# Patient Record
Sex: Female | Born: 1935 | Race: White | Hispanic: No | Marital: Married | Smoking: Never smoker
Health system: Southern US, Community
[De-identification: ages and names within clinical notes are randomized; demographics above are authoritative.]

## PROBLEM LIST (undated history)

## (undated) DIAGNOSIS — I2510 Atherosclerotic heart disease of native coronary artery without angina pectoris: Secondary | ICD-10-CM

## (undated) DIAGNOSIS — R7302 Impaired glucose tolerance (oral): Secondary | ICD-10-CM

## (undated) DIAGNOSIS — K219 Gastro-esophageal reflux disease without esophagitis: Secondary | ICD-10-CM

## (undated) DIAGNOSIS — M810 Age-related osteoporosis without current pathological fracture: Secondary | ICD-10-CM

## (undated) DIAGNOSIS — H547 Unspecified visual loss: Secondary | ICD-10-CM

## (undated) DIAGNOSIS — E785 Hyperlipidemia, unspecified: Secondary | ICD-10-CM

## (undated) DIAGNOSIS — M1990 Unspecified osteoarthritis, unspecified site: Secondary | ICD-10-CM

## (undated) DIAGNOSIS — K449 Diaphragmatic hernia without obstruction or gangrene: Secondary | ICD-10-CM

## (undated) DIAGNOSIS — H3530 Unspecified macular degeneration: Secondary | ICD-10-CM

## (undated) DIAGNOSIS — I700 Atherosclerosis of aorta: Secondary | ICD-10-CM

## (undated) DIAGNOSIS — I493 Ventricular premature depolarization: Secondary | ICD-10-CM

## (undated) DIAGNOSIS — I499 Cardiac arrhythmia, unspecified: Secondary | ICD-10-CM

## (undated) DIAGNOSIS — I4891 Unspecified atrial fibrillation: Secondary | ICD-10-CM

## (undated) DIAGNOSIS — M4800 Spinal stenosis, site unspecified: Secondary | ICD-10-CM

## (undated) DIAGNOSIS — E739 Lactose intolerance, unspecified: Secondary | ICD-10-CM

## (undated) DIAGNOSIS — K635 Polyp of colon: Secondary | ICD-10-CM

## (undated) DIAGNOSIS — G8929 Other chronic pain: Secondary | ICD-10-CM

## (undated) DIAGNOSIS — M25569 Pain in unspecified knee: Secondary | ICD-10-CM

## (undated) DIAGNOSIS — Z91018 Allergy to other foods: Secondary | ICD-10-CM

## (undated) DIAGNOSIS — M25511 Pain in right shoulder: Secondary | ICD-10-CM

## (undated) DIAGNOSIS — M549 Dorsalgia, unspecified: Secondary | ICD-10-CM

## (undated) DIAGNOSIS — J302 Other seasonal allergic rhinitis: Secondary | ICD-10-CM

## (undated) DIAGNOSIS — M8580 Other specified disorders of bone density and structure, unspecified site: Secondary | ICD-10-CM

## (undated) HISTORY — DX: Unspecified atrial fibrillation: I48.91

## (undated) HISTORY — PX: BACK SURGERY: SHX140

## (undated) HISTORY — DX: Pain in right shoulder: M25.511

## (undated) HISTORY — DX: Other seasonal allergic rhinitis: J30.2

## (undated) HISTORY — DX: Unspecified visual loss: H54.7

## (undated) HISTORY — DX: Impaired glucose tolerance (oral): R73.02

## (undated) HISTORY — DX: Ventricular premature depolarization: I49.3

## (undated) HISTORY — DX: Allergy to other foods: Z91.018

## (undated) HISTORY — DX: Diaphragmatic hernia without obstruction or gangrene: K44.9

## (undated) HISTORY — DX: Other specified disorders of bone density and structure, unspecified site: M85.80

## (undated) HISTORY — DX: Atherosclerosis of aorta: I70.0

## (undated) HISTORY — PX: WISDOM TOOTH EXTRACTION: SHX21

## (undated) HISTORY — PX: TONSILLECTOMY AND ADENOIDECTOMY: SHX28

## (undated) HISTORY — DX: Lactose intolerance, unspecified: E73.9

## (undated) HISTORY — DX: Polyp of colon: K63.5

## (undated) HISTORY — DX: Hyperlipidemia, unspecified: E78.5

## (undated) HISTORY — DX: Atherosclerotic heart disease of native coronary artery without angina pectoris: I25.10

## (undated) HISTORY — DX: Spinal stenosis, site unspecified: M48.00

## (undated) HISTORY — DX: Unspecified osteoarthritis, unspecified site: M19.90

## (undated) HISTORY — DX: Unspecified macular degeneration: H35.30

## (undated) HISTORY — DX: Gastro-esophageal reflux disease without esophagitis: K21.9

## (undated) HISTORY — DX: Age-related osteoporosis without current pathological fracture: M81.0

---

## 1997-09-02 ENCOUNTER — Other Ambulatory Visit: Admission: RE | Admit: 1997-09-02 | Discharge: 1997-09-02 | Payer: Self-pay | Admitting: Family Medicine

## 1998-01-06 ENCOUNTER — Ambulatory Visit (HOSPITAL_COMMUNITY): Admission: RE | Admit: 1998-01-06 | Discharge: 1998-01-06 | Payer: Self-pay | Admitting: Gastroenterology

## 1998-04-29 ENCOUNTER — Encounter: Payer: Self-pay | Admitting: Emergency Medicine

## 1998-04-29 ENCOUNTER — Observation Stay (HOSPITAL_COMMUNITY): Admission: EM | Admit: 1998-04-29 | Discharge: 1998-04-30 | Payer: Self-pay | Admitting: Emergency Medicine

## 1999-07-25 ENCOUNTER — Encounter: Admission: RE | Admit: 1999-07-25 | Discharge: 1999-10-23 | Payer: Self-pay | Admitting: Family Medicine

## 2001-03-23 ENCOUNTER — Emergency Department (HOSPITAL_COMMUNITY): Admission: EM | Admit: 2001-03-23 | Discharge: 2001-03-23 | Payer: Self-pay | Admitting: Emergency Medicine

## 2001-03-23 ENCOUNTER — Encounter: Payer: Self-pay | Admitting: Emergency Medicine

## 2003-03-04 ENCOUNTER — Other Ambulatory Visit: Admission: RE | Admit: 2003-03-04 | Discharge: 2003-03-04 | Payer: Self-pay | Admitting: Family Medicine

## 2004-01-26 ENCOUNTER — Ambulatory Visit (HOSPITAL_COMMUNITY): Admission: RE | Admit: 2004-01-26 | Discharge: 2004-01-26 | Payer: Self-pay | Admitting: Gastroenterology

## 2004-01-26 ENCOUNTER — Encounter (INDEPENDENT_AMBULATORY_CARE_PROVIDER_SITE_OTHER): Payer: Self-pay | Admitting: *Deleted

## 2004-04-25 ENCOUNTER — Emergency Department (HOSPITAL_COMMUNITY): Admission: EM | Admit: 2004-04-25 | Discharge: 2004-04-25 | Payer: Self-pay | Admitting: Emergency Medicine

## 2004-06-06 ENCOUNTER — Other Ambulatory Visit: Admission: RE | Admit: 2004-06-06 | Discharge: 2004-06-06 | Payer: Self-pay | Admitting: Family Medicine

## 2004-06-20 ENCOUNTER — Encounter (INDEPENDENT_AMBULATORY_CARE_PROVIDER_SITE_OTHER): Payer: Self-pay | Admitting: Specialist

## 2004-06-20 ENCOUNTER — Ambulatory Visit (HOSPITAL_COMMUNITY): Admission: RE | Admit: 2004-06-20 | Discharge: 2004-06-20 | Payer: Self-pay | Admitting: Gastroenterology

## 2004-10-18 ENCOUNTER — Encounter: Admission: RE | Admit: 2004-10-18 | Discharge: 2004-10-18 | Payer: Self-pay | Admitting: Family Medicine

## 2005-07-19 ENCOUNTER — Encounter: Admission: RE | Admit: 2005-07-19 | Discharge: 2005-07-19 | Payer: Self-pay | Admitting: Family Medicine

## 2005-09-24 ENCOUNTER — Encounter: Admission: RE | Admit: 2005-09-24 | Discharge: 2005-09-24 | Payer: Self-pay | Admitting: Psychiatry

## 2006-01-12 ENCOUNTER — Encounter: Admission: RE | Admit: 2006-01-12 | Discharge: 2006-01-12 | Payer: Self-pay | Admitting: Family Medicine

## 2006-06-10 LAB — HM PAP SMEAR
HM Pap smear: NORMAL
HM Pap smear: NORMAL

## 2006-06-20 ENCOUNTER — Encounter: Admission: RE | Admit: 2006-06-20 | Discharge: 2006-06-20 | Payer: Self-pay | Admitting: Family Medicine

## 2007-06-26 HISTORY — PX: OTHER SURGICAL HISTORY: SHX169

## 2007-07-08 ENCOUNTER — Encounter: Admission: RE | Admit: 2007-07-08 | Discharge: 2007-10-06 | Payer: Self-pay | Admitting: Family Medicine

## 2008-04-19 ENCOUNTER — Encounter: Admission: RE | Admit: 2008-04-19 | Discharge: 2008-04-19 | Payer: Self-pay | Admitting: Surgery

## 2008-04-20 ENCOUNTER — Ambulatory Visit (HOSPITAL_BASED_OUTPATIENT_CLINIC_OR_DEPARTMENT_OTHER): Admission: RE | Admit: 2008-04-20 | Discharge: 2008-04-20 | Payer: Self-pay | Admitting: Surgery

## 2008-04-20 ENCOUNTER — Encounter (INDEPENDENT_AMBULATORY_CARE_PROVIDER_SITE_OTHER): Payer: Self-pay | Admitting: Surgery

## 2009-05-11 ENCOUNTER — Emergency Department (HOSPITAL_COMMUNITY): Admission: EM | Admit: 2009-05-11 | Discharge: 2009-05-11 | Payer: Self-pay | Admitting: Emergency Medicine

## 2009-05-25 DIAGNOSIS — S32010A Wedge compression fracture of first lumbar vertebra, initial encounter for closed fracture: Secondary | ICD-10-CM

## 2009-05-25 HISTORY — DX: Wedge compression fracture of first lumbar vertebra, initial encounter for closed fracture: S32.010A

## 2009-05-25 HISTORY — PX: VERTEBROPLASTY: SHX113

## 2009-05-28 ENCOUNTER — Encounter: Admission: RE | Admit: 2009-05-28 | Discharge: 2009-05-28 | Payer: Self-pay | Admitting: *Deleted

## 2009-06-02 ENCOUNTER — Encounter: Admission: RE | Admit: 2009-06-02 | Discharge: 2009-06-02 | Payer: Self-pay | Admitting: Specialist

## 2009-06-21 ENCOUNTER — Encounter: Admission: RE | Admit: 2009-06-21 | Discharge: 2009-06-23 | Payer: Self-pay | Admitting: Specialist

## 2009-06-29 ENCOUNTER — Encounter: Admission: RE | Admit: 2009-06-29 | Discharge: 2009-09-27 | Payer: Self-pay | Admitting: Specialist

## 2009-11-14 ENCOUNTER — Encounter: Admission: RE | Admit: 2009-11-14 | Discharge: 2009-11-14 | Payer: Self-pay | Admitting: Family Medicine

## 2009-11-14 LAB — HM MAMMOGRAPHY

## 2009-11-17 LAB — HM PAP SMEAR: HM Pap smear: NORMAL

## 2010-04-17 ENCOUNTER — Encounter: Admission: RE | Admit: 2010-04-17 | Discharge: 2010-04-17 | Payer: Self-pay | Admitting: Family Medicine

## 2010-11-07 NOTE — Op Note (Signed)
[name], [name]              ACCOUNT NO.:  [id number]   MEDICAL RECORD NO.:  [id number]          PATIENT TYPE:  AMB   LOCATION:  DSC                          FACILITY:  [facility]   PHYSICIAN:  Thornton Park. Daphine Deutscher, MD  DATE OF BIRTH:  April 26, 1930   DATE OF PROCEDURE:  04/20/2008  DATE OF DISCHARGE:                               OPERATIVE REPORT   PREOPERATIVE DIAGNOSIS:  Mass of the left neck.   POSTOPERATIVE DIAGNOSIS:  Lipoma, left neck.   PROCEDURE:  Excision of posterior neck mass proximally 3 cm in diameter.   SURGEON:  Thornton Park. Daphine Deutscher, MD   ANESTHESIA:  MAC with local 1% lidocaine with Neut.   DESCRIPTION OF PROCEDURE:  [name] is a 77 year old lady  taken to room 1 at [facility] on the afternoon of April 20, 2008.  Then the neck was prepped with Technicare and draped sterilely.  I injected it with 1% Marcaine with Neut which is sodium bicarbonate and  then made an transverse incision along the skin line as I previously  marked.  I carried this down through the dermis and through some  thickened tissue to encounter little encapsulated multilobulated lipoma  were.  I went ahead and cut down and shelled that out and it came out in  pieces which I just extracted.  I then policed the wound.  I did not see  any other evidence of any other portions of it and I had taken this down  off the muscle, although I did not cut into the muscle fascia.  Bleeding  was controlled with electrocautery and then the wound was irrigated with  saline and closed with 4-0 Vicryl subcutaneously and subcuticularly with  Dermabond on the skin.  Sterile dressings was applied.  The patient will  be taken to the recovery room.  She will be given some Vicodin to take  if needed for pain.  Followup in the office in 4-5 weeks.      Thornton Park Daphine Deutscher, MD  Electronically Signed     MBM/MEDQ  D:  04/20/2008  T:  04/21/2008  Job:  [id number]   cc:   Talmadge Coventry, M.D.

## 2010-11-10 NOTE — Op Note (Signed)
Samantha Foley, Samantha Foley NO.:  [id number]   MEDICAL RECORD NO.:  [id number]                   PATIENT TYPE:  AMB   LOCATION:  ENDO                                 FACILITY:  [facility]   PHYSICIAN:  Danise Edge, M.D.                DATE OF BIRTH:  1929/09/28   DATE OF PROCEDURE:  01/26/2004  DATE OF DISCHARGE:                                 OPERATIVE REPORT   REFERRING PHYSICIAN:  Dr. Rise Mu Mazzocchi   PROCEDURE:  Screening colonoscopy.   PROCEDURE INDICATION:  Samantha Foley is a 77 year old female, born  [birth year].  Samantha Foley older sister was diagnosed with colon  cancer.   ENDOSCOPIST:  Danise Edge, M.D.   PREMEDICATION:  1. Versed 6 mg.  2. Fentanyl 60 mcg.   DESCRIPTION OF PROCEDURE:  After obtaining informed consent, Samantha Foley was  placed in the left lateral decubitus position.  I administered intravenous  fentanyl and intravenous Versed to achieve conscious sedation for the  procedure.  The patient's blood pressure, oxygen saturation, and cardiac  rhythm were monitored throughout the procedure and documented in the medical  record.   Anal inspection and digital rectal exam were normal.  The Olympus adjustable  pediatric colonoscope was introduced into the rectum and advanced to the  cecum.  Colonic preparation for the exam today was excellent.   RECTUM:  Normal.  SIGMOID COLON AND DESCENDING COLON:  Sigmoid colonic diverticulosis.  SPLENIC FLEXURE:  Normal.  TRANSVERSE COLON:  From the distal transverse colon, a 1 mm sessile polyp  was removed with the cold biopsy forceps.  From the proximal transverse  colon, a 1 mm sessile polyp was removed with the cold biopsy forceps.  HEPATIC FLEXURE:  Normal.  ASCENDING COLON:  Normal.  CECUM AND ILEOCECAL VALVE:  Normal.   ASSESSMENT:  1. Two diminutive polyps were removed from the transverse colon.  2. Sigmoid colonic diverticulosis.   RECOMMENDATIONS:  Repeat  colonoscopy in 5 years.                                               Danise Edge, M.D.    MJ/MEDQ  D:  01/26/2004  T:  01/26/2004  Job:  [id number]   cc:   Talmadge Coventry, M.D.  [address]  [place]  [place] [address]  Fax: [phone]

## 2010-11-10 NOTE — Op Note (Signed)
Samantha Foley NO.:  [id number]   MEDICAL RECORD NO.:  [id number]          PATIENT TYPE:  AMB   LOCATION:  ENDO                         FACILITY:  [facility]   PHYSICIAN:  Danise Edge, M.D.   DATE OF BIRTH:  June 06, 1930   DATE OF PROCEDURE:  06/20/2004  DATE OF DISCHARGE:                                 OPERATIVE REPORT   PROCEDURE INDICATIONS:  [name] is a 77 year old female born  1930-01-16.  [name] older sister was diagnosed with colon  cancer.  [name] underwent a screening proctocolonoscopy to the cecum  January 26, 2004 which resulted in the removal of two diminutive tubular  adenomatous polyps from her transverse colon.  She is due for a repeat  surveillance colonoscopy with polypectomy to prevent colon cancer in August  of 2010.   [name] has chronic gastroesophageal reflux.  Despite taking Nexium 40  mg twice daily, she is experiencing nocturnal regurgitation associated with  burning, retrosternal discomfort and cough.  She also experiences  regurgitation after a big meal if she bends over at the waist.  She does not  experience day time heartburn and reports no dysphagia or odynophagia.   [name] plans to join [employer] in January 2006 in the hopes of  losing excess weight.  She tells me she underwent an abdominal ultrasound  which showed no gallstones and an upper GI x-ray series which revealed a  hiatal hernia.   MEDICATION ALLERGIES:  Penicillin and Demerol.   PAST MEDICAL HISTORY:  Dilatation and curettage, hiatal hernia,  environmental allergies.   FAMILY HISTORY:  Sister diagnosed with colon cancer.   HABITS:  [name] does not smoke cigarettes and does not consume alcohol.   ENDOSCOPIST:  Danise Edge, M.D.   PREMEDICATION:  Versed 5 mg, fentanyl 25 mcg.   PROCEDURE:  After obtaining informed consent, [name] was placed in the  left lateral decubitus position.  I administered  intravenous fentanyl and  intravenous Versed to achieve conscious sedation for the procedure.  The  patient's blood pressure, oxygen saturation and cardiac rhythm were  monitored throughout the procedure and documented in the medical record.   The Olympus gastroscope was passed through the posterior hypopharynx into  the proximal stomach without difficulty.  The hypopharynx, larynx, and vocal  cords appeared normal.   ESOPHAGOSCOPY:  The procedure mid and lower segments of the esophageal  mucosa appear normal.  There is no endoscopic evidence for the presence of  erosive esophagitis, esophageal mucosal snoring or Barrett's esophagus.  The  esophagogastric junction and squamocolumnar junction are noted at 38 cm from  the incisor teeth.   GASTROSCOPY:  A retroflexed view of the gastric cardia and fundus was  normal.  The diaphragmatic hiatus is slightly patulous.  Examination of the  proximal gastric body reveals a few 1 mm - 2 mm gastric polyps which were  biopsied.  The remainder of the gastric body, antrum and pylorus appeared  normal.   DUODENOSCOPY:  The duodenal bulb, and descending duodenum appear normal.   ASSESSMENT:  Except for  the presence of a few diminutive gastric polyps, Ms.  Samantha Foley esophagogastroduodenoscopy is normal.  Gastric polyp biopsy  pending.      MJ/MEDQ  D:  06/20/2004  T:  06/20/2004  Job:  [id number]   cc:   Talmadge Coventry, M.D.  [address]  [place]  [place] [address]  Fax: [phone]

## 2011-03-26 LAB — BASIC METABOLIC PANEL
CO2: 31
Calcium: 9.4
GFR calc non Af Amer: >60
Potassium: 4.2
Sodium: 141

## 2011-03-26 LAB — POCT HEMOGLOBIN-HEMACUE: Hemoglobin: 14.4

## 2011-04-23 ENCOUNTER — Other Ambulatory Visit: Payer: Self-pay | Admitting: Family Medicine

## 2011-04-23 DIAGNOSIS — Z79899 Other long term (current) drug therapy: Secondary | ICD-10-CM

## 2011-04-23 DIAGNOSIS — Z1231 Encounter for screening mammogram for malignant neoplasm of breast: Secondary | ICD-10-CM

## 2011-05-08 ENCOUNTER — Ambulatory Visit
Admission: RE | Admit: 2011-05-08 | Discharge: 2011-05-08 | Disposition: A | Discharge status: Home / Self Care | Payer: Medicare Other | Source: Ambulatory Visit | Attending: Family Medicine | Admitting: Family Medicine

## 2011-05-08 DIAGNOSIS — Z79899 Other long term (current) drug therapy: Secondary | ICD-10-CM

## 2011-05-08 DIAGNOSIS — M8580 Other specified disorders of bone density and structure, unspecified site: Secondary | ICD-10-CM

## 2011-05-08 DIAGNOSIS — Z1231 Encounter for screening mammogram for malignant neoplasm of breast: Secondary | ICD-10-CM

## 2011-05-08 LAB — HM DEXA SCAN

## 2011-07-02 ENCOUNTER — Encounter: Payer: Self-pay | Admitting: Medical

## 2011-07-02 ENCOUNTER — Ambulatory Visit (INDEPENDENT_AMBULATORY_CARE_PROVIDER_SITE_OTHER): Payer: Medicare Other | Admitting: Medical

## 2011-07-02 VITALS — BP 110/70 | HR 68 | Temp 98.4°F | Resp 16 | Ht 61.0 in | Wt 176.0 lb

## 2011-07-02 DIAGNOSIS — R0982 Postnasal drip: Secondary | ICD-10-CM

## 2011-07-02 DIAGNOSIS — J40 Bronchitis, not specified as acute or chronic: Secondary | ICD-10-CM

## 2011-07-02 MED ORDER — HYDROCODONE-HOMATROPINE 5-1.5 MG/5ML PO SYRP: 5.0000 mL | ORAL_SOLUTION | Freq: Four times a day (QID) | ORAL | Status: AC | PRN

## 2011-07-02 NOTE — Progress Notes (Signed)
Subjective:  Samantha Foley is a 78 y.o. female who presents as a new patient.  Is a former patient of Dr. Wynetta Emery.  She has been having lingering cough.  Cough started before Christmas, she had a bad cold that lasted for about 6 days, and although the cold symptoms improved, she still has lingering cough.  She notes bad post nasal drip, had 1/2 hour coughing spell last night, has clear nasal drainage and clear sputum, but no ear pain, sore throat, fever.  She notes some sinus pressure, but no chest congestion.  She is a nonsmoker, no hx/o lung disease.  Denies chest pain, SOB, no edema.  She had been using Allegra for the cold symptoms, but hasn't used it in the last week or more.  Denies sick contacts.  No other aggravating or relieving factors.  No other c/o.  The following portions of the patient's history were reviewed and updated as appropriate: allergies, current medications, past family history, past medical history, past social history, past surgical history and problem list.  Past Medical History  Diagnosis Date  . Allergy   . Vision problem     wears contacts  . Arthritis   . Lactose intolerance   . GERD (gastroesophageal reflux disease)   . History of dilation and curettage     ROS: Gen: no fever, chills, sweats, wt loss Skin: no rash Lungs: no SOB, wheezing Heart: no CP, palpitations, edema GI: no belly pain , nvd    Objective:   Filed Vitals:   07/02/11 1407  BP: 110/70  Pulse: 68  Temp: 98.4 F (36.9 C)  Resp: 16    General appearance: Alert, WD/WN, no distress                             Skin: warm, no rash, no diaphoresis                           Head: no sinus tenderness                            Eyes: conjunctiva normal, corneas clear, PERRLA                            Ears: pearly TMs, external ear canals normal                          Nose: septum midline, turbinates swollen, with erythema and clear discharge             Mouth/throat: MMM, tongue  normal, mild pharyngeal erythema, post nasal drip                           Neck: supple, no adenopathy, no thyromegaly, nontender                          Heart: RRR, normal S1, S2, no murmurs                         Lungs: +bronchial breath sounds, no scattered rhonchi, no wheezes, no rales                Extremities: no edema, nontender  Assessment and Plan:   Encounter Diagnoses  Name Primary?  . Bronchitis Yes  . Post-nasal drip    Symptoms and exam suggest inflammation, post nasal drip and lingering cough without obvious infection.  Prescription given today for Hycodan syrup as below.  Restart Allegra.  Discussed diagnosis and treatment of bronchitis.  Suggested symptomatic OTC remedies for cough and congestion.  Nasal saline spray for nasal congestion.  Tylenol or Ibuprofen OTC for fever and malaise.  Call/return in 2-3 days if symptoms are worse or not improving.  If worsening, consider chest xray.

## 2011-07-02 NOTE — Patient Instructions (Signed)
Begin back on Allegra for the next 2-3 weeks regarding post nasal drip.  Consider nasal saline spray and salt water gargles for the drainage.  I wrote a prescription for Hycodan cough syrup.  You can use this either at bedtime only, to up to every 6 hours.   Drink plenty of water.    If not improving, or if worse or fever in the next 2-3 days, return for chest xray and additional evaluation.    Acute Bronchitis Bronchitis is a problem of the air tubes leading to your lungs. Acute means the illness started quickly. In this condition, the lining of those tubes becomes puffy (swollen) and can leak fluid. This makes it harder for air to get in and out of your lungs. You may cough a lot. This is because the air tubes are narrow. Bronchitis is most often caused by a virus. Medicines that kill germs (antibiotics) may be needed with germ (bacteria) infections for people who:  Smoke.   Have lasting (chronic) lung problems.   Are elderly.  HOME CARE  Rest.   Drink enough water and fluids to keep the pee clear or pale yellow.   Only take medicine as told by your doctor.   Medicines may be prescribed that will open up the airways. This will help make breathing easier.   Bronchitis usually gets better on its own in a few days.  Recovery from some problems (symptoms) of bronchitis may be slow. You should start feeling a little better after 2 to 3 days. Coughing may last for 3 to 4 weeks. GET HELP RIGHT AWAY IF:  You or your child has a temperature by mouth above 101, not controlled by medicine.   Chills or chest pain develops.   You or your child develops very bad shortness of breath.   There is bloody saliva mixed with mucus (sputum).   You or your child throws up (vomits) often, loses too much fluid (dehydration), feels faint, or has a very bad headache.   You or your child does not improve after 1 week of treatment.  MAKE SURE YOU:   Understand these instructions.   Will watch this  condition.   Will get help right away if you or your child is not doing well or gets worse.  Document Released: 11/28/2007 Document Re-Released: 09/05/2009 [facility] Patient Information 2011 [place], [place].

## 2011-07-26 ENCOUNTER — Emergency Department (INDEPENDENT_AMBULATORY_CARE_PROVIDER_SITE_OTHER): Payer: Medicare Other

## 2011-07-26 ENCOUNTER — Encounter (HOSPITAL_BASED_OUTPATIENT_CLINIC_OR_DEPARTMENT_OTHER): Payer: Self-pay

## 2011-07-26 ENCOUNTER — Emergency Department (HOSPITAL_BASED_OUTPATIENT_CLINIC_OR_DEPARTMENT_OTHER)
Admission: EM | Admit: 2011-07-26 | Discharge: 2011-07-26 | Disposition: A | Discharge status: Home / Self Care | Payer: Medicare Other | Attending: Emergency Medicine | Admitting: Emergency Medicine

## 2011-07-26 DIAGNOSIS — M549 Dorsalgia, unspecified: Secondary | ICD-10-CM | POA: Insufficient documentation

## 2011-07-26 DIAGNOSIS — S93609A Unspecified sprain of unspecified foot, initial encounter: Secondary | ICD-10-CM | POA: Insufficient documentation

## 2011-07-26 DIAGNOSIS — Z8739 Personal history of other diseases of the musculoskeletal system and connective tissue: Secondary | ICD-10-CM | POA: Insufficient documentation

## 2011-07-26 DIAGNOSIS — M7989 Other specified soft tissue disorders: Secondary | ICD-10-CM

## 2011-07-26 DIAGNOSIS — W19XXXA Unspecified fall, initial encounter: Secondary | ICD-10-CM

## 2011-07-26 DIAGNOSIS — M2010 Hallux valgus (acquired), unspecified foot: Secondary | ICD-10-CM

## 2011-07-26 DIAGNOSIS — M79609 Pain in unspecified limb: Secondary | ICD-10-CM

## 2011-07-26 DIAGNOSIS — G8929 Other chronic pain: Secondary | ICD-10-CM | POA: Insufficient documentation

## 2011-07-26 DIAGNOSIS — K219 Gastro-esophageal reflux disease without esophagitis: Secondary | ICD-10-CM | POA: Insufficient documentation

## 2011-07-26 DIAGNOSIS — W108XXA Fall (on) (from) other stairs and steps, initial encounter: Secondary | ICD-10-CM | POA: Insufficient documentation

## 2011-07-26 DIAGNOSIS — M25569 Pain in unspecified knee: Secondary | ICD-10-CM | POA: Insufficient documentation

## 2011-07-26 HISTORY — DX: Other chronic pain: G89.29

## 2011-07-26 HISTORY — DX: Dorsalgia, unspecified: M54.9

## 2011-07-26 HISTORY — DX: Pain in unspecified knee: M25.569

## 2011-07-26 MED ORDER — HYDROCODONE-ACETAMINOPHEN 5-325 MG PO TABS
ORAL_TABLET | ORAL | Status: AC
  Filled 2011-07-26: qty 1

## 2011-07-26 NOTE — ED Notes (Signed)
Patient transported to X-ray via stretcher 

## 2011-07-26 NOTE — ED Notes (Signed)
Injury to left foot after falling down two steps.  Abrasion and swelling noted to foot.

## 2011-07-26 NOTE — ED Provider Notes (Signed)
Medical screening examination/treatment/procedure(s) were performed by non-physician practitioner and as supervising physician I was immediately available for consultation/collaboration.   Dione Booze, MD 07/26/11 [phone]

## 2011-07-26 NOTE — ED Provider Notes (Signed)
History     CSN: [id number]  Arrival date & time 07/26/11  1709   First MD Initiated Contact with Patient 07/26/11 1746      Chief Complaint  Patient presents with  . Fall  . Foot Injury    (Consider location/radiation/quality/duration/timing/severity/associated sxs/prior treatment) HPI Comments: Pt states that she was walking down the steps and missed the last 2 steps:pt state that there was no dizziness or loc associated with the fall:pt states that she is having pain in the left foot:pt states that it hurts with wt bearing  Patient is a 78 y.o. female presenting with fall. The history is provided by the patient. No language interpreter was used.  Fall The accident occurred 1 to 2 hours ago. She landed on a hard floor. There was no blood loss. Point of impact: left foot. Pain location: left foot. The pain is mild. Pertinent negatives include no vomiting and no loss of consciousness. The symptoms are aggravated by activity.    Past Medical History  Diagnosis Date  . Allergy   . Vision problem     wears contacts  . Arthritis   . Lactose intolerance   . GERD (gastroesophageal reflux disease)   . History of dilation and curettage   . Chronic back pain   . Chronic knee pain     Past Surgical History  Procedure Date  . Tonsillectomy   . Adenoidectomy   . Wisdom tooth extraction   . Back surgery     No family history on file.  History  Substance Use Topics  . Smoking status: Never Smoker   . Smokeless tobacco: Not on file  . Alcohol Use: No    OB History    Grav Para Term Preterm Abortions TAB SAB Ect Mult Living                  Review of Systems  Gastrointestinal: Negative for vomiting.  Neurological: Negative for loss of consciousness.  All other systems reviewed and are negative.    Allergies  Ciprofloxacin; Scallops; Demerol; Milk-related compounds; Penicillins; and Sudafed  Home Medications   Current Outpatient Rx  Name Route Sig Dispense  Refill  . FEXOFENADINE HCL 180 MG PO TABS Oral Take 180 mg by mouth daily.     . IBUPROFEN 200 MG PO TABS Oral Take 400 mg by mouth daily.      BP 146/76  Pulse 77  Temp(Src) 97.7 F (36.5 C) (Oral)  Resp 16  Ht 5\' 3"  (1.6 m)  Wt 175 lb (79.379 kg)  BMI 31.00 kg/m2  SpO2 97%  Physical Exam  Nursing note and vitals reviewed. Constitutional: She is oriented to person, place, and time. She appears well-developed and well-nourished.  HENT:  Head: Normocephalic and atraumatic.  Cardiovascular: Normal rate and regular rhythm.   Pulmonary/Chest: Effort normal and breath sounds normal.  Musculoskeletal:       Swelling noted to the base of the left fifth metatarsal:pulses intact  Neurological: She is alert and oriented to person, place, and time.  Skin:       Pt has an abrasion and swelling noted to the base of the left fifth metatarsal:pt has full rom  Psychiatric: She has a normal mood and affect.    ED Course  Procedures (including critical care time)  Labs Reviewed - No data to display Dg Foot Complete Left  07/26/2011  *RADIOLOGY REPORT*  Clinical Data: Left foot pain.  Fall.  Fracture.  LEFT FOOT - COMPLETE  3+ VIEW  Comparison: None.  Findings: Hallux valgus.  First MTP joint osteoarthritis.  Marked soft tissue swelling is present lateral to the cuboid.  No displaced fractures identified.  Diffuse osteopenia is present. Calcific densities are present lateral to the cuboid bone which is favored to represent atherosclerotic calcifications however with overlying soft tissue swelling these could represent small avulsion fractures.  Consider conservative management.  IMPRESSION: No definite displaced fracture is identified.  Calcific densities lateral to the cuboid bone could represent small avulsion fractures and there is overlying soft tissue swelling.  Consider conservative management and follow-up radiographs.  Original Report Authenticated By: Andreas Newport, M.D.     1. Foot  sprain       MDM  Pt treated with cam walker:pt is okay to follow up with her orthopedist        Teressa Lower, NP 07/26/11 1826

## 2011-08-08 ENCOUNTER — Encounter: Payer: Self-pay | Admitting: *Deleted

## 2011-08-08 ENCOUNTER — Encounter: Payer: Self-pay | Admitting: Family Medicine

## 2011-08-08 ENCOUNTER — Ambulatory Visit (INDEPENDENT_AMBULATORY_CARE_PROVIDER_SITE_OTHER): Payer: Medicare Other | Admitting: Family Medicine

## 2011-08-08 VITALS — BP 108/60 | HR 68 | Ht 61.0 in | Wt 177.0 lb

## 2011-08-08 DIAGNOSIS — E7800 Pure hypercholesterolemia, unspecified: Secondary | ICD-10-CM

## 2011-08-08 DIAGNOSIS — K219 Gastro-esophageal reflux disease without esophagitis: Secondary | ICD-10-CM | POA: Insufficient documentation

## 2011-08-08 DIAGNOSIS — M899 Disorder of bone, unspecified: Secondary | ICD-10-CM

## 2011-08-08 DIAGNOSIS — Z0000 Encounter for general adult medical examination without abnormal findings: Secondary | ICD-10-CM

## 2011-08-08 DIAGNOSIS — R7301 Impaired fasting glucose: Secondary | ICD-10-CM | POA: Insufficient documentation

## 2011-08-08 DIAGNOSIS — I471 Supraventricular tachycardia: Secondary | ICD-10-CM

## 2011-08-08 DIAGNOSIS — Z79899 Other long term (current) drug therapy: Secondary | ICD-10-CM

## 2011-08-08 DIAGNOSIS — M8580 Other specified disorders of bone density and structure, unspecified site: Secondary | ICD-10-CM | POA: Insufficient documentation

## 2011-08-08 DIAGNOSIS — I4891 Unspecified atrial fibrillation: Secondary | ICD-10-CM | POA: Insufficient documentation

## 2011-08-08 LAB — POCT URINALYSIS DIPSTICK
Bilirubin, UA: NEGATIVE
Glucose, UA: NEGATIVE
Nitrite, UA: NEGATIVE
Urobilinogen, UA: NEGATIVE

## 2011-08-08 MED ORDER — ESOMEPRAZOLE MAGNESIUM 40 MG PO CPDR: 40.0000 mg | DELAYED_RELEASE_CAPSULE | Freq: Every day | ORAL | Status: DC

## 2011-08-08 NOTE — Patient Instructions (Addendum)
HEALTH MAINTENANCE RECOMMENDATIONS:  It is recommended that you get at least 30 minutes of aerobic exercise at least 5 days/week (for weight loss, you may need as much as 60-90 minutes). This can be any activity that gets your heart rate up. This can be divided in 10-15 minute intervals if needed, but try and build up your endurance at least once a week.  Weight bearing exercise is also recommended twice weekly.  Eat a healthy diet with lots of vegetables, fruits and fiber.  "Colorful" foods have a lot of vitamins (ie green vegetables, tomatoes, red peppers, etc).  Limit sweet tea, regular sodas and alcoholic beverages, all of which has a lot of calories and sugar.  Up to 1 alcoholic drink daily may be beneficial for women (unless trying to lose weight, watch sugars).  Drink a lot of water.  Calcium recommendations are 1200-1500 mg daily (1500 mg for postmenopausal women or women without ovaries), and vitamin D 1000 IU daily.  This should be obtained from diet and/or supplements (vitamins), and calcium should not be taken all at once, but in divided doses.  Monthly self breast exams and yearly mammograms for women over the age of 22 is recommended.  Sunscreen of at least SPF 30 should be used on all sun-exposed parts of the skin when outside between the hours of 10 am and 4 pm (not just when at beach or pool, but even with exercise, golf, tennis, and yard work!)  Use a sunscreen that says "broad spectrum" so it covers both UVA and UVB rays, and make sure to reapply every 1-2 hours.  Remember to change the batteries in your smoke detectors when changing your clock times in the spring and fall.  Use your seat belt every time you are in a car, and please drive safely and not be distracted with cell phones and texting while driving.  Please follow up with Dr. Laural Benes if you continue to have problems with your hiatal hernia/reflux despite the nexium, especially if you have ongoing trouble swallowing. You  may need to have an endoscopy repeated within the next few years, sooner if not improving.  Restart Calcium and Vitamin D supplements.  Diet for GERD or PUD Nutrition therapy can help ease the discomfort of gastroesophageal reflux disease (GERD) and peptic ulcer disease (PUD).  HOME CARE INSTRUCTIONS   Eat your meals slowly, in a relaxed setting.   Eat 5 to 6 small meals per day.   If a food causes distress, stop eating it for a period of time.  FOODS TO AVOID  Coffee, regular or decaffeinated.   Cola beverages, regular or low calorie.   Tea, regular or decaffeinated.   Pepper.   Cocoa.   High fat foods, including meats.   Butter, margarine, hydrogenated oil (trans fats).   Peppermint or spearmint (if you have GERD).   Fruits and vegetables if not tolerated.   Alcohol.   Nicotine (smoking or chewing). This is one of the most potent stimulants to acid production in the gastrointestinal tract.   Any food that seems to aggravate your condition.  If you have questions regarding your diet, ask your caregiver or a registered dietitian. TIPS  Lying flat may make symptoms worse. Keep the head of your bed raised 6 to 9 inches (15 to 23 cm) by using a foam wedge or blocks under the legs of the bed.   Do not lay down until 3 hours after eating a meal.   Daily physical activity  may help reduce symptoms.  MAKE SURE YOU:   Understand these instructions.   Will watch your condition.   Will get help right away if you are not doing well or get worse.  Document Released: 06/11/2005 Document Revised: 02/21/2011 Document Reviewed: 10/25/2008 [facility] Patient Information 2012 [place], [place].

## 2011-08-08 NOTE — Progress Notes (Signed)
Samantha Foley is a 78 y.o. female who presents for a complete physical.  She has the following concerns:  Patient states she is having some trouble swallowing, having daily heartburn, and also some chest pain this month, followed by burping.  She has h/o GERD and hiatal hernia, and has been on meds since the early 60's.  Has been off meds for about 4 years.  Last med she took was Nexium. Food sometimes gets stuck while she's eating, better by drinking fluids, but occurs once a week.  Last endoscopy was 2005.  Larey Seat last week, twisted L ankle going down the stairs.  Reports having "weak ankles", and felt the ankle give way.  Occurred while at a friend's house, couldn't get up, went to ER at [facility], and saw Dr. Jerl Santos in f/u, diagnosed with sprained ankle. Told to ice, ace wrap.  Last fall prior to this was a few years ago.  Hyperlipidemia--Previously took red yeast rice.  Hasn't taken in about 2 years.  Took fish oil until about a month ago.  Tried statins in the past, but caused significant leg pain.  Eats 3 eggs/week, and only red meat infrequently, burgers 1x/week.  Osteoporosis: Last DEXA 04/2011.  T-1.7 L femoral neck.  2.1% decrease in BMD spine and no change L hip compared to 03/2010. Previously treated with Forteo for 1.5 years, off since approximately September 2012 due to terrible leg cramps. H/o lumbar compression fracture. Not currently taking any calcium or Vitamin D supplements; recalls tolerating them fine in the past.  Palpitations, SVT.  Previously treated by Dr. Clarene Duke with Lanoxin.  She has been off the medication for a year or two.  Only rarely gets palpiations/tachycardia  DJD, spinal stenosis--under the care of Dr. Shelle Iron.  Pain is controlled with Advil.  Knee pain is also controlled by the Advil.  Health maintenance: Immunization History  Administered Date(s) Administered  . Pneumococcal Polysaccharide 04/26/2004  . Td 11/26/1994, 12/23/2005  . Tdap 03/07/2011  .  Zoster 07/16/2005  Gets flu shots yearly. Last Pap smear: 10/2009 Last mammogram: 05/08/2011 Last colonoscopy: 01/2009.  H/o polyps in 2005. Last DEXA: 04/2011 Dentist: 3 times/year Ophtho: a few months ago Exercise:  none  Past Medical History  Diagnosis Date  . Seasonal allergies   . Vision problem     wears contacts  . Arthritis   . Lactose intolerance   . GERD (gastroesophageal reflux disease)   . Food allergy     Scallops--no other seafood allergies  . Chronic back pain     Dr. Shelle Iron  . Chronic knee pain     Dr. Jerl Santos  . Colon polyp     Dr. Laural Benes  . Palpitations     SVT.  Dr. Clarene Duke; previously on Lanoxin  . IBS (irritable bowel syndrome)   . Impaired glucose tolerance   . Osteopenia   . Hyperlipidemia   . Migraine     resolved after menopause  . Hiatal hernia   . Milk allergy     Past Surgical History  Procedure Date  . Tonsillectomy and adenoidectomy age 9  . Wisdom tooth extraction   . Vertebroplasty 05/2009  . Dilation and curettage of uterus 1960's  . Sebaceous cyst excision 2009    neck    History   Social History  . Marital Status: Married    Spouse Name: N/A    Number of Children: 2  . Years of Education: N/A   Occupational History  . 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 Smoking status: Never Smoker   . Smokeless tobacco: Never Used  . Alcohol Use: Yes     maybe one glass per year.  . Drug Use: No  . Sexually Active: Not Currently   Other Topics Concern  . Not on file   Social History Narrative   Lives with her husband.  No pets.  Daughter in [place], [place]; Daughter in [place]. Cares for her 2 grandsons    Family History  Problem Relation Age of Onset  . Rheumatic fever Mother   . Diabetes Mother   . Heart disease Mother     rheumatic heart disease  . Cancer Sister 44    breast cancer  . Cancer Brother     bladder and prostate cancer  . Migraines Daughter   . Cancer Sister      leukemia at 58; colon cancer in her late 30's--family ?'s dx   Meds: takes 2 advil every morning after breakfast, no other medications currently.  No vitamins.  Allergies  Allergen Reactions  . Ciprofloxacin Shortness Of Breath and Rash  . Scallops (Shellfish Allergy) Anaphylaxis  . Demerol Other (See Comments)    Patient states it made her crazy and sleep for 12 hours  . Milk-Related Compounds Diarrhea  . Penicillins Other (See Comments)    unknown  . Sudafed (Pseudoephedrine Hcl) Other (See Comments)    hyperactive   ROS: The patient denies anorexia, fever, weight changes, headaches,  vision changes, decreased hearing, ear pain, sore throat, breast concerns, chest pain, palpitations, dizziness, syncope, dyspnea on exertion, cough, swelling, nausea, vomiting, diarrhea, constipation, abdominal pain, melena, hematochezia, hematuria, incontinence, dysuria, vaginal bleeding, discharge, odor or itch, genital lesions, joint pains, numbness, tingling, weakness, tremor, suspicious skin lesions, depression, anxiety, abnormal bleeding/bruising, or enlarged lymph nodes.  PHYSICAL EXAM: BP 108/60  Pulse 68  Ht 5\' 1"  (1.549 m)  Wt 177 lb (80.287 kg)  BMI 33.44 kg/m2  General Appearance:    Alert, cooperative, no distress, appears stated age  Head:    Normocephalic, without obvious abnormality, atraumatic  Eyes:    PERRL, conjunctiva/corneas clear, EOM's intact, fundi    benign  Ears:    Normal TM's and external ear canals  Nose:   Nares normal, mucosa normal, no drainage or sinus   tenderness  Throat:   Lips, mucosa, and tongue normal; teeth and gums normal  Neck:   Supple, no lymphadenopathy;  thyroid:  no   enlargement/tenderness/nodules; no carotid   bruit or JVD  Back:    Spine nontender, no curvature, ROM normal, no CVA     tenderness  Lungs:     Clear to auscultation bilaterally without wheezes, rales or     ronchi; respirations unlabored  Chest Wall:    No tenderness or deformity    Heart:    Regular rate and rhythm, S1 and S2 normal, no murmur, rub   or gallop  Breast Exam:    No tenderness, masses, or nipple discharge. Nipples inverted bilaterally, chronic per pt.  No axillary lymphadenopathy  Abdomen:     Soft, non-tender, nondistended, normoactive bowel sounds,    no masses, no hepatosplenomegaly  Genitalia:    Normal external genitalia without lesions.  BUS and vagina normal; no cervical motion tenderness. No abnormal vaginal discharge.  Uterus and adnexa not enlarged, nontender, no masses.  Pap performed  Rectal:    Normal tone, no masses or tenderness;  guaiac negative stool  Extremities:   No clubbing, cyanosis or edema. L ankle--some swelling.  Bruising at toes.  Pain with eversion, no bony pain  Pulses:   2+ and symmetric all extremities  Skin:   Skin color, texture, turgor normal, no rashes or lesions  Lymph nodes:   Cervical, supraclavicular, and axillary nodes normal  Neurologic:   CNII-XII intact, normal strength, sensation and gait; reflexes 2+ and symmetric throughout          Psych:   Normal mood, affect, hygiene and grooming.      ASSESSMENT/PLAN: 1. Routine general medical examination at a health care facility  POCT Urinalysis Dipstick, Visual acuity screening  2. Encounter for long-term (current) use of other medications  Comprehensive metabolic panel, CBC with Differential  3. Pure hypercholesterolemia  Lipid panel  4. Impaired fasting glucose  Hemoglobin A1c  5. GERD (gastroesophageal reflux disease)  esomeprazole (NEXIUM) 40 MG capsule  6. Osteopenia     Osteopenia--restart calcium and vitamin D.  Regular weight-bearing exercise.  Repeat DEXA in 04/2013. Normal vitamin D level in 10/2009 (although may have been on supplements at that time).  GERD--restart Nexium.  F/u with Dr. Laural Benes for possible EGD if symptoms not resolving quickly. Consider getting EGD regardless, given length of history of GERD, although last was 2005.  Hyperlipidemia--off  OTC meds, due for labs.  Return for fasting labs  Likely had TSH done with labs in 10/2010, results aren't available from that date  SVT--infrequent symptoms, off of meds.  F/u with Dr. Clarene Duke if tachycardia increases in frequency.  Discussed monthly self breast exams and yearly mammograms; at least 30 minutes of aerobic activity at least 5 days/week; proper sunscreen use reviewed; healthy diet, including goals of calcium and vitamin D intake and alcohol recommendations (less than or equal to 1 drink/day) reviewed; regular seatbelt use; changing batteries in smoke detectors.  Immunization recommendations discussed--UTD.  Colonoscopy recommendations reviewed--UTD  F/u 1 month on reflux, sooner prn

## 2011-08-09 ENCOUNTER — Other Ambulatory Visit: Payer: Self-pay | Admitting: *Deleted

## 2011-08-15 ENCOUNTER — Other Ambulatory Visit (INDEPENDENT_AMBULATORY_CARE_PROVIDER_SITE_OTHER): Payer: Medicare Other

## 2011-08-15 DIAGNOSIS — E7800 Pure hypercholesterolemia, unspecified: Secondary | ICD-10-CM

## 2011-08-15 DIAGNOSIS — R7301 Impaired fasting glucose: Secondary | ICD-10-CM

## 2011-08-15 DIAGNOSIS — R300 Dysuria: Secondary | ICD-10-CM

## 2011-08-15 DIAGNOSIS — Z79899 Other long term (current) drug therapy: Secondary | ICD-10-CM

## 2011-08-15 LAB — CBC WITH DIFFERENTIAL/PLATELET
Basophils Absolute: 0.1 10*3/uL (ref 0.0–0.1)
HCT: 39.6 % (ref 36.0–46.0)
Lymphocytes Relative: 19 % (ref 12–46)
Monocytes Absolute: 0.5 10*3/uL (ref 0.1–1.0)
Neutro Abs: 4.1 10*3/uL (ref 1.7–7.7)
Neutrophils Relative %: 71 % (ref 43–77)
Platelets: 195 10*3/uL (ref 150–400)
RDW: 14.8 % (ref 11.5–15.5)
WBC: 5.8 10*3/uL (ref 4.0–10.5)

## 2011-08-15 LAB — LIPID PANEL
Cholesterol: 233 mg/dL — ABNORMAL HIGH (ref 0–200)
HDL: 45 mg/dL (ref >39)
Triglycerides: 100 mg/dL (ref <150)

## 2011-08-15 LAB — POCT URINALYSIS DIPSTICK
Bilirubin, UA: NEGATIVE
Ketones, UA: NEGATIVE
Spec Grav, UA: 1.015
pH, UA: 6

## 2011-08-15 LAB — COMPREHENSIVE METABOLIC PANEL
ALT: 23 U/L (ref 0–35)
CO2: 27 mEq/L (ref 19–32)
Calcium: 9.1 mg/dL (ref 8.4–10.5)
Chloride: 104 mEq/L (ref 96–112)
Creat: 0.74 mg/dL (ref 0.50–1.10)

## 2011-08-17 LAB — URINE CULTURE

## 2011-08-22 ENCOUNTER — Other Ambulatory Visit: Payer: Self-pay | Admitting: *Deleted

## 2011-08-22 DIAGNOSIS — E7800 Pure hypercholesterolemia, unspecified: Secondary | ICD-10-CM

## 2011-09-05 ENCOUNTER — Ambulatory Visit (INDEPENDENT_AMBULATORY_CARE_PROVIDER_SITE_OTHER): Payer: Medicare Other | Admitting: Family Medicine

## 2011-09-05 ENCOUNTER — Encounter: Payer: Self-pay | Admitting: Family Medicine

## 2011-09-05 VITALS — BP 122/74 | HR 68 | Ht 61.0 in | Wt 180.0 lb

## 2011-09-05 DIAGNOSIS — E7800 Pure hypercholesterolemia, unspecified: Secondary | ICD-10-CM

## 2011-09-05 DIAGNOSIS — K219 Gastro-esophageal reflux disease without esophagitis: Secondary | ICD-10-CM

## 2011-09-05 MED ORDER — COLESEVELAM HCL 3.75 G PO PACK: 3.7500 g | PACK | Freq: Every day | ORAL | Status: DC

## 2011-09-05 NOTE — Patient Instructions (Signed)
Continue the Nexium for 1-2 months, then can try to cut back to every other day, and eventually you MIGHT be able to cut back to taking it just before meals that tend to trigger reflux.  But, if you need to use it longterm in order to control reflux symptoms, then that is fine.  Continue low cholesterol diet and Welchol.  Schedule labs in 2 months.  Diet for GERD or PUD Nutrition therapy can help ease the discomfort of gastroesophageal reflux disease (GERD) and peptic ulcer disease (PUD).  HOME CARE INSTRUCTIONS   Eat your meals slowly, in a relaxed setting.   Eat 5 to 6 small meals per day.   If a food causes distress, stop eating it for a period of time.  FOODS TO AVOID  Coffee, regular or decaffeinated.   Cola beverages, regular or low calorie.   Tea, regular or decaffeinated.   Pepper.   Cocoa.   High fat foods, including meats.   Butter, margarine, hydrogenated oil (trans fats).   Peppermint or spearmint (if you have GERD).   Fruits and vegetables if not tolerated.   Alcohol.   Nicotine (smoking or chewing). This is one of the most potent stimulants to acid production in the gastrointestinal tract.   Any food that seems to aggravate your condition.  If you have questions regarding your diet, ask your caregiver or a registered dietitian. TIPS  Lying flat may make symptoms worse. Keep the head of your bed raised 6 to 9 inches (15 to 23 cm) by using a foam wedge or blocks under the legs of the bed.   Do not lay down until 3 hours after eating a meal.   Daily physical activity may help reduce symptoms.  MAKE SURE YOU:   Understand these instructions.   Will watch your condition.   Will get help right away if you are not doing well or get worse.  Document Released: 06/11/2005 Document Revised: 05/31/2011 Document Reviewed: 04/27/2011 [facility] Patient Information 2012 [place], [place].

## 2011-09-05 NOTE — Progress Notes (Signed)
Patient presents for f/u on stomach issues.  At last visit, she stated she was having some trouble swallowing, having daily heartburn, and also some chest pain this month, followed by burping. She has h/o GERD and hiatal hernia, and has been on meds since the early 60's. Has been off meds for about 4 years. Last med she took was Nexium. Food sometimes gets stuck while she's eating, better by drinking fluids, but occurs once a week. Last endoscopy was 2005.  At last visit, it was recommended that she restart Nexium.  Since restarting Nexium, her symptoms completely resolved. No further dysphagia, or chest pain, or heartburn/belching.  Started the [facility] and so far is tolerating it okay.  Past Medical History  Diagnosis Date  . Seasonal allergies   . Vision problem     wears contacts  . Arthritis   . Lactose intolerance   . GERD (gastroesophageal reflux disease)   . Food allergy     Scallops--no other seafood allergies  . Chronic back pain     Dr. Shelle Iron  . Chronic knee pain     Dr. Jerl Santos  . Colon polyp     Dr. Laural Benes  . Palpitations     SVT.  Dr. Clarene Duke; previously on Lanoxin  . IBS (irritable bowel syndrome)   . Impaired glucose tolerance   . Osteopenia   . Hyperlipidemia   . Migraine     resolved after menopause  . Hiatal hernia   . Milk allergy     Past Surgical History  Procedure Date  . Tonsillectomy and adenoidectomy age 66  . Wisdom tooth extraction   . Vertebroplasty 05/2009  . Dilation and curettage of uterus 1960's  . Sebaceous cyst excision 2009    neck    History   Social History  . Marital Status: Married    Spouse Name: N/A    Number of Children: 2  . Years of Education: N/A   Occupational History  . retired (ran [employer] with her husband)    Social History Main Topics  . Smoking status: Never Smoker   . Smokeless tobacco: Never Used  . Alcohol Use: Yes     maybe one glass per year.  . Drug Use: No  . Sexually Active: Not Currently     Other Topics Concern  . Not on file   Social History Narrative   Lives with her husband.  No pets.  Daughter in [place], [place]; Daughter in [place]. Cares for her 2 grandsons    Family History  Problem Relation Age of Onset  . Rheumatic fever Mother   . Diabetes Mother   . Heart disease Mother     rheumatic heart disease  . Cancer Sister 42    breast cancer  . Cancer Brother     bladder and prostate cancer  . Migraines Daughter   . Cancer Sister     leukemia at 28; colon cancer in her late 30's--family ?'s dx    Current outpatient prescriptions:Colesevelam HCl (WELCHOL) 3.75 G PACK, Take 3.75 g by mouth daily., Disp: 30 each, Rfl: 2;  DISCONTD: Colesevelam HCl (WELCHOL) 3.75 G PACK, Take 3.75 g by mouth daily., Disp: , Rfl: ;  esomeprazole (NEXIUM) 40 MG capsule, Take 1 capsule (40 mg total) by mouth daily., Disp: 30 capsule, Rfl: 3;  fexofenadine (ALLEGRA) 180 MG tablet, Take 180 mg by mouth daily. , Disp: , Rfl:  ibuprofen (ADVIL,MOTRIN) 200 MG tablet, Take 400 mg by mouth daily., Disp: ,  Rfl:   Allergies  Allergen Reactions  . Ciprofloxacin Shortness Of Breath and Rash  . Scallops (Shellfish Allergy) Anaphylaxis  . Demerol Other (See Comments)    Patient states it made her crazy and sleep for 12 hours  . Milk-Related Compounds Diarrhea  . Penicillins Other (See Comments)    unknown  . Sudafed (Pseudoephedrine Hcl) Other (See Comments)    hyperactive   ROS:  +leg cramps at night. Hasn't tolerated Mg supplements in past due to diarrhea. Denies fevers, headaches, dizziness, dysphagia, chest pain, nausea, vomiting, URI symptoms, skin rash or other problems.  PHYSICAL EXAM: BP 148/68  Pulse 68  Ht 5\' 1"  (1.549 m)  Wt 180 lb (81.647 kg)  BMI 34.01 kg/m2 122/74 Well developed, pleasant, talkative female in no distress Neck: no lymphadenopathy or mass Heart: regular rate and rhythm Lungs: clear Abdomen: soft, nontender, nondistended, no organomegaly or mass Extremities: no  edema Psych: normal mood, affect, hygiene and grooming  Lab Results  Component Value Date   CHOL 233* 08/15/2011   HDL 45 08/15/2011   LDLCALC 168* 08/15/2011   TRIG 100 08/15/2011   CHOLHDL 5.2 08/15/2011    ASSESSMENT/PLAN: 1. Pure hypercholesterolemia  Colesevelam HCl (WELCHOL) 3.75 G PACK  2. GERD (gastroesophageal reflux disease)      GERD--risks of longterm meds vs untreated GERD reviewed at length. Continue Nexium for at least two months, then try cutting back to qod, and down to prn if able.  Hyperlipidemia: Continue Welchol.  Schedule labs for 2 months.  Low cholesterol diet reviewed.

## 2011-09-25 ENCOUNTER — Telehealth: Payer: Self-pay | Admitting: Internal Medicine

## 2011-09-25 NOTE — Telephone Encounter (Signed)
Pt was notified to stop welchol for a week and after a week to try taking it at night vs during the day and that if she continues to have rapid heartbeats to come in for evaulation. Pt was told to avoid decongestants and caffeine.

## 2011-09-25 NOTE — Telephone Encounter (Signed)
I'm concerned that these symptoms might not be related to the medication.  Have her stop the [facility] for a week.  If she is still having these rapid heartbeats, she needs to come in and be evaluated.  Avoid decongestants and caffeine.  If symptoms go away, then can retry the Welchol to see if it recurs--can try changing when she takes it (ie at night, vs during day).  This is not a typical side effect.

## 2011-09-25 NOTE — Telephone Encounter (Signed)
pt called stating that she thinks she is having side effects to the welchol. she states she is having rapid heartbeats. however one epsiode lasted 4 hours and she has been woken up twice during the night with rapid hearts beats. pt wants to know what to do. Pt states she reacts different to medications

## 2011-10-10 ENCOUNTER — Encounter (HOSPITAL_COMMUNITY): Payer: Self-pay | Admitting: Neurology

## 2011-10-10 ENCOUNTER — Other Ambulatory Visit: Payer: Self-pay

## 2011-10-10 ENCOUNTER — Emergency Department (HOSPITAL_COMMUNITY): Payer: Medicare Other

## 2011-10-10 ENCOUNTER — Emergency Department (HOSPITAL_COMMUNITY)
Admission: EM | Admit: 2011-10-10 | Discharge: 2011-10-10 | Disposition: A | Discharge status: Home / Self Care | Payer: Medicare Other | Attending: Emergency Medicine | Admitting: Emergency Medicine

## 2011-10-10 DIAGNOSIS — M79609 Pain in unspecified limb: Secondary | ICD-10-CM | POA: Insufficient documentation

## 2011-10-10 DIAGNOSIS — M5030 Other cervical disc degeneration, unspecified cervical region: Secondary | ICD-10-CM

## 2011-10-10 DIAGNOSIS — M79601 Pain in right arm: Secondary | ICD-10-CM

## 2011-10-10 LAB — BASIC METABOLIC PANEL
CO2: 26 mEq/L (ref 19–32)
Chloride: 105 mEq/L (ref 96–112)
Creatinine, Ser: 0.64 mg/dL (ref 0.50–1.10)
Potassium: 3.7 mEq/L (ref 3.5–5.1)

## 2011-10-10 LAB — CBC
HCT: 39.9 % (ref 36.0–46.0)
MCV: 88.9 fL (ref 78.0–100.0)
Platelets: 173 10*3/uL (ref 150–400)
RBC: 4.49 MIL/uL (ref 3.87–5.11)
WBC: 4.7 10*3/uL (ref 4.0–10.5)

## 2011-10-10 NOTE — ED Notes (Signed)
Patient transported to X-ray 

## 2011-10-10 NOTE — ED Notes (Signed)
In communication with CDU to move patient over

## 2011-10-10 NOTE — ED Notes (Addendum)
Pt reporting right arm pain and weakness since woke up this morning. Pt also reporting numbness in bottom lip. Pt called PCP, told to come to ER for evaluation. Pt denying any CP or SOB. Skin warm and dry. NT obtaining EKG. PT talking in clear, concise sentences. Pt ambulated to traige room without difficulty. No neuro deficits noted

## 2011-10-10 NOTE — ED Provider Notes (Signed)
Assumed care of patient in the CDU from Dr. Denton Lank.  Patient comes in today with right arm pain.  No erythema or edema.  Dr. Denton Lank ordered xray of her cervical spine and shoulder as well as CBC and BMP.  Results pending.  Plan per Dr. Denton Lank is for patient to be discharged home if labs are WNL.  He recommends starting the patient on Prednisone taper and having her follow up with PCP.  Reassessed patient.  VSS.  Alert and orientated x 3, No facial asymmetry, CN II-XII grossly intact, full ROM or right arm, no erythema or edema, normal gait.  1:55 Pm Discussed results of the xrays with the patient.  Labs unremarkable.  C-spine xray shows degenerative disease and severe disc space narrowing of C4-C5 and C5-C6.  Patient is refusing Prednisone because she reports that she is unable to sleep while on Prednisone.  Patient also reports that she does not want pain medication.  Will discharge patient home with PCP follow up.    Pascal [place], PA-C 10/10/11 1420

## 2011-10-10 NOTE — Discharge Instructions (Signed)
Return to the Emergency Department if you develop any redness or swelling of your arm, any difficulty speaking or swallowing, and weakness, severe headache, changes in vision, or any other symptoms that are concerning to you.  You can take ibuprofen every 6 hours.  Follow up with your primary care physician.

## 2011-10-10 NOTE — ED Notes (Signed)
Report given to Gabe, RN 

## 2011-10-10 NOTE — ED Provider Notes (Signed)
History     CSN: [id number]  Arrival date & time 10/10/11  1134   First MD Initiated Contact with Patient 10/10/11 1204      Chief Complaint  Patient presents with  . Arm Pain    (Consider location/radiation/quality/duration/timing/severity/associated sxs/prior treatment) Patient is a 78 y.o. female presenting with arm pain. The history is provided by the patient.  Arm Pain Pertinent negatives include no chest pain, no abdominal pain, no headaches and no shortness of breath.  pt c/o pain to entire right arm since this morning. Says goes from shoulder to wrist/hand. ?radicular in nature. Says is a pain, is not numbness/weakness. Denies hx ddd. Says sitting in certain positions seem to make worse. No relation to activity or exertion. No chest discomfort of any sort, no exertional pain. No unusual doe or fatigue. No diaphoresis. No arm numbness/weakness. Hx arthritis. Unsure of slept wrong/funny. No loss of function or dexterity. No change in speech or vision. No  Problems w balance, gait or coordination. States this morning transiently had numb/tingly feel across lower lip bilaterally, otherwise no facial numbness/weakness. Denies arm swelling. No dvt or pe hx. Denies repetitive use injury.   Past Medical History  Diagnosis Date  . Seasonal allergies   . Vision problem     wears contacts  . Arthritis   . Lactose intolerance   . GERD (gastroesophageal reflux disease)   . Food allergy     Scallops--no other seafood allergies  . Chronic back pain     Dr. Shelle Iron  . Chronic knee pain     Dr. Jerl Santos  . Colon polyp     Dr. Laural Benes  . Palpitations     SVT.  Dr. Clarene Duke; previously on Lanoxin  . IBS (irritable bowel syndrome)   . Impaired glucose tolerance   . Osteopenia   . Hyperlipidemia   . Migraine     resolved after menopause  . Hiatal hernia   . Milk allergy     Past Surgical History  Procedure Date  . Tonsillectomy and adenoidectomy age 35  . Wisdom tooth extraction   .  Vertebroplasty 05/2009  . Dilation and curettage of uterus 1960's  . Sebaceous cyst excision 2009    neck    Family History  Problem Relation Age of Onset  . Rheumatic fever Mother   . Diabetes Mother   . Heart disease Mother     rheumatic heart disease  . Cancer Sister 67    breast cancer  . Cancer Brother     bladder and prostate cancer  . Migraines Daughter   . Cancer Sister     leukemia at 79; colon cancer in her late 30's--family ?'s dx    History  Substance Use Topics  . Smoking status: Never Smoker   . Smokeless tobacco: Never Used  . Alcohol Use: No     maybe one glass per year.    OB History    Grav Para Term Preterm Abortions TAB SAB Ect Mult Living                  Review of Systems  Constitutional: Negative for fever and chills.  HENT: Negative for neck pain.   Eyes: Negative for redness.  Respiratory: Negative for shortness of breath.   Cardiovascular: Negative for chest pain.  Gastrointestinal: Negative for abdominal pain.  Genitourinary: Negative for flank pain.  Musculoskeletal: Negative for back pain.  Skin: Negative for rash.  Neurological: Negative for headaches.  Hematological: Does  not bruise/bleed easily.  Psychiatric/Behavioral: Negative for confusion.    Allergies  Ciprofloxacin; Scallops; Demerol; Milk-related compounds; Penicillins; and Sudafed  Home Medications   Current Outpatient Rx  Name Route Sig Dispense Refill  . COLESEVELAM HCL 3.75 G PO PACK Oral Take 3.75 g by mouth daily. 30 each 2  . ESOMEPRAZOLE MAGNESIUM 40 MG PO CPDR Oral Take 1 capsule (40 mg total) by mouth daily. 30 capsule 3  . FEXOFENADINE HCL 180 MG PO TABS Oral Take 180 mg by mouth daily.     . IBUPROFEN 200 MG PO TABS Oral Take 800 mg by mouth daily.       BP 155/70  Pulse 69  Temp(Src) 97.8 F (36.6 C) (Oral)  Resp 16  SpO2 98%  Physical Exam  Nursing note and vitals reviewed. Constitutional: She is oriented to person, place, and time. She  appears well-developed and well-nourished. No distress.  HENT:  Head: Atraumatic.  Eyes: Conjunctivae are normal. No scleral icterus.  Neck: Neck supple. No tracheal deviation present.       No bruit  Cardiovascular: Normal rate, regular rhythm, normal heart sounds and intact distal pulses.   Pulmonary/Chest: Effort normal and breath sounds normal. No respiratory distress.  Abdominal: Soft. Normal appearance. She exhibits no distension. There is no tenderness.  Musculoskeletal: Normal range of motion. She exhibits no edema and no tenderness.       Good rom at shoulder, elbow and wrist without pain. Radial pulse 2+. No swelling to area. No focal pain/bony tenderness. No skin changes, rash, or erythema. Spine aligned, no step off. Pt with right cervical and trapezius muscular tenderness.   Neurological: She is alert and oriented to person, place, and time. No cranial nerve deficit.       Motor intact bil. Steady gait. No pronator drift.   Skin: Skin is warm and dry. No rash noted.       No rash or herpetic lesions noted in area of pain.   Psychiatric: She has a normal mood and affect.    ED Course  Procedures (including critical care time)    MDM  Xrays.    Moved to cdu pending for labs, xrays, discussed plan with cdu pa.    Suzi Roots, MD 10/12/11 1149

## 2011-10-11 ENCOUNTER — Encounter: Payer: Self-pay | Admitting: Family Medicine

## 2011-10-11 ENCOUNTER — Ambulatory Visit (INDEPENDENT_AMBULATORY_CARE_PROVIDER_SITE_OTHER): Payer: Medicare Other | Admitting: Family Medicine

## 2011-10-11 VITALS — BP 148/70 | HR 60 | Ht 61.0 in | Wt 176.0 lb

## 2011-10-11 DIAGNOSIS — J309 Allergic rhinitis, unspecified: Secondary | ICD-10-CM

## 2011-10-11 DIAGNOSIS — M542 Cervicalgia: Secondary | ICD-10-CM

## 2011-10-11 MED ORDER — FLUTICASONE PROPIONATE 50 MCG/ACT NA SUSP: 2.0000 | Freq: Every day | NASAL | Status: DC

## 2011-10-11 NOTE — Patient Instructions (Addendum)
Try using heat, massage and stretches to the neck as shown at today's visit.  Do this at least twice daily.  Continue the advil, if needed, for pain relief. If you are having ongoing pain in your neck and/or arm, we can refer you for physical therapy.  Your x-rays show a lot of degenerative changes, but I think there is a muscular component also to the pain you had.  Your blood pressure was a little high today (better than yesterday, when you were in more pain). Your BP has been normal at previous visits, so I'm not particularly alarmed.  Periodically check your BP to ensure that it is usually normal (<135/85), and watch your sodium/salt intake, which can raise your blood pressure.

## 2011-10-11 NOTE — Progress Notes (Signed)
Patient presents for f/u ER visit yesterday for R arm pain.  Had x-rays of neck and shoulder, and CBC and BMP.  Steroid taper was recommended, but patient declined due to trouble with insomnia from steroids.  Pain began yesterday morning--it woke her up.  She though she may have slept wrong.  Tried "working it out" by using it, but it continued to get worse.  When she developed some numbness in her face, she decided to go to the ER.  Has a small area under her entire lower lip that periodically gets numb.  It seems to come and go. Yesterday the pain was in the "whole arm".  Today, she feels much better, most of pain is gone.  Has some pain R wrist. And she has pain in R neck only with certain head positions/movement.  Denies any radiation of pain into arm.  Denies any numbness/tingling or weakness today.  Yesterday she had trouble with some weakness in the arm, holding one of her vitamin jars.  Having increased back pain, especially with walking.  Got 2nd injection at Dr. Ermelinda Das office (by his PA) earlier this week, not helping yet.  Allergies have been flaring, started suddenly last week.  Watery/runny nose, +sneezing.  Denies any eye symptoms.  Some cough from postnasal drainage, and nasal stuffiness at bedtime.  Used Afrin one night with good results.  Previously used nasal steroid spray, believes it was Flonase because it was generic. Recalls that it didn't seem to be as effective when she got a different company supplying the generic.   ER results reviewed:  RIGHT SHOULDER - 2+ VIEW  Comparison: None.  Findings: Degenerative changes in the right AC joint and glenohumeral joint. Cystic changes in the humeral head. No acute bony abnormality. Specifically, no fracture, subluxation, or dislocation. Soft tissues are intact.  IMPRESSION: No acute bony abnormality.  Original Report Authenticated By: Cyndie Chime, M.D.  CERVICAL SPINE - COMPLETE 4+ VIEW  Comparison: None.  Findings: 2 mm  of anterolisthesis of C4 on C5. Straightening of cervical lordosis otherwise. Prevertebral soft tissue contours are within normal limits. Cervicothoracic junction alignment is within normal limits. Severe disc space narrowing at C5-C6 with endplate spurring. Bilateral posterior element alignment is within normal limits. Moderate to severe C4-C5 facet hypertrophy greater on the left. Thoracic scoliosis partially visible. Lung apices within normal limits. C1-C2 alignment and odontoid within normal limits.  IMPRESSION: C4-C5 facet and C5-C6 disc degeneration.  Original Report Authenticated By: Harley Hallmark, M.D.  Lab Results  Component Value Date   WBC 4.7 10/10/2011   HGB 13.2 10/10/2011   HCT 39.9 10/10/2011   MCV 88.9 10/10/2011   PLT 173 10/10/2011     Chemistry      Component Value Date/Time   NA 140 10/10/2011 1228   K 3.7 10/10/2011 1228   CL 105 10/10/2011 1228   CO2 26 10/10/2011 1228   BUN 9 10/10/2011 1228   CREATININE 0.64 10/10/2011 1228   CREATININE 0.74 08/15/2011 1015      Component Value Date/Time   CALCIUM 9.3 10/10/2011 1228   ALKPHOS 83 08/15/2011 1015   AST 22 08/15/2011 1015   ALT 23 08/15/2011 1015   BILITOT 0.6 08/15/2011 1015     PHYSICAL EXAM: BP 148/70  Pulse 60  Ht 5\' 1"  (1.549 m)  Wt 176 lb (79.833 kg)  BMI 33.25 kg/m2 Well developed, pleasant female, in no distress Neck/spine: no spinal tenderness.  FROM.  Some pain with forward flexion  and L ear to L shoulder (pain R trapezius muscle).  Tender in R trapezius muscle, no significant spasm noted.  FROM of shoulder, without pain.  No tenderness at [facility] joint on right HEENT:  PERRL, conjunctiva clear.  Nasal mucosa mildly edematous, no purulence or erythema.  OP clear.  Sinuses nontender Neck: no lymphadenopathy or mass. Heart: regular rate and rhythm Lungs: clear bilaterally   ASSESSMENT/PLAN:  1. Neck pain on right side     muscular component, and underlying DDD  2. Allergic rhinitis, cause  unspecified  fluticasone (FLONASE) 50 MCG/ACT nasal spray    Shoulder pain--significantly improved from yesterday.  Possibly had some muscular component, but clearly has some arthritic/degenerative component.  Pain is doing better, is on NSAID's already for her back.  Continue these.  If pain worsens, likely would benefit from physical therapy.  Shown exercises, recommended heat and massage.  Allergies--start flonase.  Reviewed proper technique.  Call to change if ineffective.  Continue the Allegra as needed.

## 2011-10-12 NOTE — ED Provider Notes (Signed)
Medical screening examination/treatment/procedure(s) were conducted as a shared visit with non-physician practitioner(s) and myself.  I personally evaluated the patient during the encounter Se h and p by me  Suzi Roots, MD 10/12/11 1154

## 2011-11-07 ENCOUNTER — Other Ambulatory Visit: Payer: Self-pay | Admitting: *Deleted

## 2011-11-07 ENCOUNTER — Other Ambulatory Visit: Payer: Medicare Other

## 2011-11-07 ENCOUNTER — Telehealth: Payer: Self-pay | Admitting: *Deleted

## 2011-11-07 DIAGNOSIS — E7800 Pure hypercholesterolemia, unspecified: Secondary | ICD-10-CM

## 2011-11-07 NOTE — Telephone Encounter (Signed)
Spoke with patient and let her know that we went ahead and canceled lipid for today. She will be more compliant with low chol diet and she is coming in 02/20/12 @ 10:00 am for lipid only (orders in) and she scheduled follow up on the labs for 02/27/12 @ 11:00am.

## 2011-11-07 NOTE — Telephone Encounter (Signed)
Patient was here for lipid panel this morning and she wanted to let you know that she hasn't been taking Welchol or any of her meds for 10 days. She had a fever of 102.5 for a 3 days, she stopped taking ALL medications-she was so sick that she slept for 3 days straight. Since February she felt as if her arthritis had been worsening, aches and pains in her legs, having a hard time walking. After she recovered from her 3 day illness in which she was off all meds she felt great-no more aches and pains so she has not resumed taking anything except advil 400mg  after breakfast. Wanted to know if you still wanted this lipid panel today? She said she really does not want to go back on the [facility]. Please advise.

## 2011-11-07 NOTE — Telephone Encounter (Signed)
The lipids may be affected by her not taking meds x 10 days.  If she wouldn't want to restart them regardless of results, then pointless to check today.  Instead, make sure that she is compliant with following low cholesterol diet, and have her return in 3-4 months for fasting lipid check (to see if diet has improved, numbers better, or if we need to discuss meds again).  Fasting labs with OV after recommended to review results

## 2012-02-20 ENCOUNTER — Other Ambulatory Visit: Payer: Medicare Other

## 2012-02-20 DIAGNOSIS — E7800 Pure hypercholesterolemia, unspecified: Secondary | ICD-10-CM

## 2012-02-20 LAB — LIPID PANEL
Cholesterol: 253 mg/dL — ABNORMAL HIGH (ref 0–200)
HDL: 45 mg/dL (ref >39)
Total CHOL/HDL Ratio: 5.6 Ratio
VLDL: 24 mg/dL (ref 0–40)

## 2012-02-27 ENCOUNTER — Ambulatory Visit (INDEPENDENT_AMBULATORY_CARE_PROVIDER_SITE_OTHER): Payer: Medicare Other | Admitting: Family Medicine

## 2012-02-27 ENCOUNTER — Encounter: Payer: Self-pay | Admitting: Family Medicine

## 2012-02-27 VITALS — BP 122/76 | HR 68 | Ht 61.0 in | Wt 175.0 lb

## 2012-02-27 DIAGNOSIS — Z23 Encounter for immunization: Secondary | ICD-10-CM

## 2012-02-27 DIAGNOSIS — K219 Gastro-esophageal reflux disease without esophagitis: Secondary | ICD-10-CM

## 2012-02-27 DIAGNOSIS — E7800 Pure hypercholesterolemia, unspecified: Secondary | ICD-10-CM

## 2012-02-27 DIAGNOSIS — D170 Benign lipomatous neoplasm of skin and subcutaneous tissue of head, face and neck: Secondary | ICD-10-CM | POA: Insufficient documentation

## 2012-02-27 DIAGNOSIS — D1739 Benign lipomatous neoplasm of skin and subcutaneous tissue of other sites: Secondary | ICD-10-CM

## 2012-02-27 NOTE — Progress Notes (Signed)
Chief Complaint  Patient presents with  . Med check    med check-labs done 02/20/12. Thinks Nexium is giving her diarrhea. Was wondering if she might start red yeast rice again-wants to discuss.    She reports that she has known lactose intolerance, milk allergy, but can't always completely avoid, and this contributes to diarrhea.  She feels like the Nexium has made the diarrhea worse.    Used Lactaid in the past with good success, but it seemed to stop working.  Used to be able to "get away with" certain cheeses including cream cheese, but no longer can tolerate.  Since being on the Nexium, her reflux is much improved.  She had been using it just as needed, but took it 2 weeks straight recently when her heartburn flared up, but it caused more diarrhea. She is wondering if she can try something else.  Started to get leg pains with the Welchol.  Stopped taking it after myalgias improved when she stopped it during an illness.  Didn't want to restart, because she was feeling great--to do a dietary trial instead.  She is wanting to go back on red yeast rice.  She stopped it in the past due to leg cramps, but she recalls now that she had also been on Forteo at the same time--thinks maybe the forteo was causing the cramps and not the Red Yeast rice and would like to retry. She admits that her "diet has been bad".  Having frequent burgers--at least 3x/week.  Goes out to eat with her husband.  Past Medical History  Diagnosis Date  . Seasonal allergies   . Vision problem     wears contacts  . Arthritis   . Lactose intolerance   . GERD (gastroesophageal reflux disease)   . Food allergy     Scallops--no other seafood allergies  . Chronic back pain     Dr. Shelle Iron  . Chronic knee pain     Dr. Jerl Santos  . Colon polyp     Dr. Laural Benes  . Palpitations     SVT.  Dr. Clarene Duke; previously on Lanoxin  . IBS (irritable bowel syndrome)   . Impaired glucose tolerance   . Osteopenia   . Hyperlipidemia   .  Migraine     resolved after menopause  . Hiatal hernia   . Milk allergy    Past Surgical History  Procedure Date  . Tonsillectomy and adenoidectomy age 34  . Wisdom tooth extraction   . Vertebroplasty 05/2009  . Dilation and curettage of uterus 1960's  . Sebaceous cyst excision 2009    neck    History   Social History  . Marital Status: Married    Spouse Name: N/A    Number of Children: 2  . Years of Education: N/A   Occupational History  . retired (ran [employer] with her husband)    Social History Main Topics  . Smoking status: Never Smoker   . Smokeless tobacco: Never Used  . Alcohol Use: No     maybe one glass per year.  . Drug Use: No  . Sexually Active: Not Currently   Other Topics Concern  . Not on file   Social History Narrative   Lives with her husband.  No pets.  Daughter in [place], [place]; Daughter in [place]. Cares for her 2 grandsons    Current Outpatient Prescriptions on File Prior to Visit  Medication Sig Dispense Refill  . Calcium Carbonate-Vitamin D (CALCIUM 600+D) 600-400 MG-UNIT per  tablet Take 2 tablets by mouth daily.      Marland Kitchen ibuprofen (ADVIL,MOTRIN) 200 MG tablet Take 800 mg by mouth daily.       . Colesevelam HCl (WELCHOL) 3.75 G PACK Take 3.75 g by mouth daily.  30 each  2  . esomeprazole (NEXIUM) 40 MG capsule Take 1 capsule (40 mg total) by mouth daily.  30 capsule  3  . fexofenadine (ALLEGRA) 180 MG tablet Take 180 mg by mouth daily.       . fluticasone (FLONASE) 50 MCG/ACT nasal spray Place 2 sprays into the nose daily.  16 g  6   Only taking calcium supplement and prn advil right now--not using other medications currently.  Stopped Welchol in April.  Allergies  Allergen Reactions  . Ciprofloxacin Shortness Of Breath and Rash  . Scallops (Shellfish Allergy) Anaphylaxis  . Demerol Other (See Comments)    Patient states it made her crazy and sleep for 12 hours  . Milk-Related Compounds Diarrhea  . Penicillins Other (See Comments)     unknown  . Sudafed (Pseudoephedrine Hcl) Other (See Comments)    hyperactive   ROS:  No further arm pain.  Still having back pain, but it is "liveable".  Injection didn't help at all.  Getting last knee injection today. Denies current allergy or URI symptoms, chest pain, shortness of breath, leg cramps, GI complaints, headaches, or other concerns.  PHYSICAL EXAM: BP 122/76  Pulse 68  Ht 5\' 1"  (1.549 m)  Wt 175 lb (79.379 kg)  BMI 33.07 kg/m2 Well developed, pleasant female in no distress HEENT:  PERRL, conjunctiva clear Neck: no lymphadenopathy, thyromegaly.  Lipoma L posterior neck, nontender Heart: regular rate and rhythm without murmur Lungs: clear bilaterally Extremities: no edema Psych: normal mood, affect, hygiene and grooming Skin: no rash/lesions.   Lab Results  Component Value Date   CHOL 253* 02/20/2012   HDL 45 02/20/2012   LDLCALC 184* 02/20/2012   TRIG 122 02/20/2012   CHOLHDL 5.6 02/20/2012    ASSESSMENT/PLAN:  1. GERD (gastroesophageal reflux disease)    2. Pure hypercholesterolemia  Lipid panel, Hepatic function panel    Hyperlipidemia--worse control due to poor diet.   Restart Red Yeast Rice along with Coenzyme Q10. Low cholesterol diet reviewed and options in restaurants discussed--limit red meat to only once a week if possible.  Hopefully a combination of diet and OTC meds will get her to goal of LDL<130, but if LDL<160, likely still okay given lack of other risk factors (ie no HTN, DM or heart disease).  3 months--liver and lipids (lab only, letter if normal; OV if abnormal)   Reflux--try OTC Prilosec 20mg  once daily as needed.  If not effective in treating reflux, okay to take 2 daily (2 together, or one twice daily).  If this doesn't cause diarrhea, and you need 40mg  daily, we can try changing to 40 mg omeprazole prescription (might be less expensive).   Diarrhea--continue to avoid dairy.  Try probiotics (ie Align)   25 minute visit, more than 1/2  counseling.

## 2012-02-27 NOTE — Patient Instructions (Signed)
  Restart Red Yeast Rice along with Coenzyme Q10.  Low cholesterol diet reviewed and options in restaurants discussed--limit red meat to only once a week if possible.  Eat more grilled chicken instead (no mayo)  Hopefully a combination of diet and red yeast rice  will get you to goal of LDL<130, but if LDL<160, likely still okay given lack of other risk factors (ie no HTN, DM or heart disease).  Reflux--try OTC Prilosec 20mg  once daily as needed.  If not effective in treating reflux, okay to take 2 daily (2 together, or one twice daily).  If this doesn't cause diarrhea, and you need 40mg  daily, we can try changing to 40 mg omeprazole prescription (might be less expensive).   Diarrhea--continue to avoid dairy.  Try probiotics (ie Align)

## 2012-03-04 ENCOUNTER — Encounter: Payer: Self-pay | Admitting: *Deleted

## 2012-05-26 ENCOUNTER — Other Ambulatory Visit: Payer: Medicare Other

## 2012-05-26 ENCOUNTER — Ambulatory Visit (INDEPENDENT_AMBULATORY_CARE_PROVIDER_SITE_OTHER): Payer: Medicare Other | Admitting: Family Medicine

## 2012-05-26 ENCOUNTER — Encounter: Payer: Self-pay | Admitting: Family Medicine

## 2012-05-26 VITALS — BP 128/72 | HR 60 | Ht 61.0 in | Wt 176.0 lb

## 2012-05-26 DIAGNOSIS — K219 Gastro-esophageal reflux disease without esophagitis: Secondary | ICD-10-CM

## 2012-05-26 DIAGNOSIS — E7800 Pure hypercholesterolemia, unspecified: Secondary | ICD-10-CM

## 2012-05-26 MED ORDER — DEXLANSOPRAZOLE 60 MG PO CPDR: 60.0000 mg | DELAYED_RELEASE_CAPSULE | Freq: Every day | ORAL | Status: DC

## 2012-05-26 NOTE — Progress Notes (Signed)
Chief Complaint  Patient presents with  . Gastrophageal Reflux    worsening. Woke up Nov 17th, 3 am-had a horrible bout of acid reflux. Thinks her Nexium may be causing some loose stools/diarrhea.   HPI:  Woke up 3-4 am one Sunday morning with her throat burning.  She spoke with Dr. Susann Givens, and she took [employer]'s version of Mylanta, and an extra Nexium, and symptoms improved.  She had already tried a mixture of baking soda and water which usually works better, but didn't help this time.  Felt better the next day.  She had gone out to dinner the night before, and had greasy, fried potato chips.  Didn't have alcohol or caffeine, nor any snack late at night.  She didn't eat any more than usual, likely less than usual (didn't like the egg salad she ordered).    She has been taking Nexium daily for about a month, before that had been only using it prn.  Since using it daily, her reflux overall has been much better until this one night.  She increased the Nexium to BID per Dr. Jola Babinski recommendation, did that for about a week, but then went back to once daily as it caused diarrhea.  Since back on it just once daily her reflux is still doing okay, but her bowels remain loose.  She has been taking a probiotic daily.  Denies dysphagia (not since being on Nexium regularly).  Hyperlipidemia--she has been on red yeast rice now for 3 months, due for labs.  Denies any myalgias or side effects. Due for recheck of lipids.  Past Medical History  Diagnosis Date  . Seasonal allergies   . Vision problem     wears contacts  . Arthritis   . Lactose intolerance   . GERD (gastroesophageal reflux disease)   . Food allergy     Scallops--no other seafood allergies  . Chronic back pain     Dr. Shelle Iron  . Chronic knee pain     Dr. Jerl Santos  . Colon polyp     Dr. Laural Benes  . Palpitations     SVT.  Dr. Clarene Duke; previously on Lanoxin  . IBS (irritable bowel syndrome)   . Impaired glucose tolerance   . Osteopenia   .  Hyperlipidemia   . Migraine     resolved after menopause  . Hiatal hernia   . Milk allergy    Past Surgical History  Procedure Date  . Tonsillectomy and adenoidectomy age 56  . Wisdom tooth extraction   . Vertebroplasty 05/2009  . Dilation and curettage of uterus 1960's  . Sebaceous cyst excision 2009    neck   History   Social History  . Marital Status: Married    Spouse Name: N/A    Number of Children: 2  . Years of Education: N/A   Occupational History  . retired (ran [employer] with her husband)    Social History Main Topics  . Smoking status: Never Smoker   . Smokeless tobacco: Never Used  . Alcohol Use: No     Comment: maybe one glass per year.  . Drug Use: No  . Sexually Active: Not Currently   Other Topics Concern  . Not on file   Social History Narrative   Lives with her husband.  No pets.  Daughter in [place], [place]; Daughter in [place]. Cares for her 2 grandsons    Current outpatient prescriptions:co-enzyme Q-10 30 MG capsule, Take 30 mg by mouth daily., Disp: , Rfl: ;  esomeprazole (NEXIUM) 40 MG capsule, Take 1 capsule (40 mg total) by mouth daily., Disp: 30 capsule, Rfl: 3;  fexofenadine (ALLEGRA) 180 MG tablet, Take 180 mg by mouth daily. , Disp: , Rfl: ;  Red Yeast Rice Extract (RED YEAST RICE PO), Take 2 tablets by mouth daily., Disp: , Rfl:  Calcium Carbonate-Vitamin D (CALCIUM 600+D) 600-400 MG-UNIT per tablet, Take 2 tablets by mouth daily., Disp: , Rfl: ;  Colesevelam HCl (WELCHOL) 3.75 G PACK, Take 3.75 g by mouth daily., Disp: 30 each, Rfl: 2;  fluticasone (FLONASE) 50 MCG/ACT nasal spray, Place 2 sprays into the nose daily., Disp: 16 g, Rfl: 6;  ibuprofen (ADVIL,MOTRIN) 200 MG tablet, Take 800 mg by mouth daily. , Disp: , Rfl:   Allergies  Allergen Reactions  . Ciprofloxacin Shortness Of Breath and Rash  . Scallops (Shellfish Allergy) Anaphylaxis  . Demerol Other (See Comments)    Patient states it made her crazy and sleep for 12 hours  .  Milk-Related Compounds Diarrhea  . Penicillins Other (See Comments)    unknown  . Sudafed (Pseudoephedrine Hcl) Other (See Comments)    hyperactive   ROS:  Denies fevers, URI symptoms, chest pain, headaches, black or bloody stool.  +diarrhea.  Denies abdominal pain, dysphagia, skin rash, bleeding/bruising or other concerns  PHYSICAL EXAM: BP 128/72  Pulse 60  Ht 5\' 1"  (1.549 m)  Wt 176 lb (79.833 kg)  BMI 33.25 kg/m2 Well developed, pleasant female, who appears younger than stated age (just turned 78!) Neck: no lymphadenopathy or mass Heart: regular rate and rhythm without murmur Lungs: clear bilaterally Abdomen: mild epigastric tenderness.  No rebound, guarding or masses.  No hepatosplenomegaly Extremities: no edema Skin: no rash Psych: normal mood, affect, hygiene and grooming Neuro: alert and oriented.  Normal gait.  Cranial nerves grossly intact  ASSESSMENT/PLAN: 1. GERD (gastroesophageal reflux disease)  dexlansoprazole (DEXILANT) 60 MG capsule, DISCONTINUED: dexlansoprazole (DEXILANT) 60 MG capsule  2. Pure hypercholesterolemia  Lipid panel, Hepatic function panel    GERD--given samples of Dexilant, given the diarrhea from Nexium.  If no change in diarrhea, or if too expensive, can either go back to nexium, vs trying other PPI (ie prevacid, omeprazole).  Reflux precautions reviewed.  Diarrhea--possibly related to PPI.  Continue probiotic.  Hyperlipidemia--checking labs today, after improved diet and red yeast rice.  She hasn't tolerated statins, nor welchol. Reviewed low cholesterol diet.

## 2012-05-26 NOTE — Progress Notes (Signed)
Phoned in.

## 2012-05-26 NOTE — Patient Instructions (Addendum)
We will be in touch with your cholesterol results. Try the Dexilant in place of the nexium, to see if your bowels improve.  If you don't notice a significant difference (and especially if there is a big cost difference) then you can just go back to the Nexium once daily, using it twice daily when needed

## 2012-05-27 ENCOUNTER — Encounter: Payer: Self-pay | Admitting: Family Medicine

## 2012-05-27 LAB — HEPATIC FUNCTION PANEL
Alkaline Phosphatase: 72 U/L (ref 39–117)
Bilirubin, Direct: 0.1 mg/dL (ref 0.0–0.3)
Indirect Bilirubin: 0.4 mg/dL (ref 0.0–0.9)
Total Bilirubin: 0.5 mg/dL (ref 0.3–1.2)
Total Protein: 6 g/dL (ref 6.0–8.3)

## 2012-05-27 LAB — LIPID PANEL
LDL Cholesterol: 145 mg/dL — ABNORMAL HIGH (ref 0–99)
Triglycerides: 88 mg/dL (ref <150)
VLDL: 18 mg/dL (ref 0–40)

## 2012-05-28 ENCOUNTER — Other Ambulatory Visit: Payer: Self-pay | Admitting: Family Medicine

## 2012-08-21 ENCOUNTER — Ambulatory Visit (INDEPENDENT_AMBULATORY_CARE_PROVIDER_SITE_OTHER): Payer: Medicare Other | Admitting: Family Medicine

## 2012-08-21 ENCOUNTER — Encounter: Payer: Self-pay | Admitting: Family Medicine

## 2012-08-21 VITALS — BP 94/60 | HR 68 | Temp 97.9°F | Ht 61.0 in | Wt 174.0 lb

## 2012-08-21 DIAGNOSIS — R071 Chest pain on breathing: Secondary | ICD-10-CM

## 2012-08-21 DIAGNOSIS — R0789 Other chest pain: Secondary | ICD-10-CM

## 2012-08-21 DIAGNOSIS — J069 Acute upper respiratory infection, unspecified: Secondary | ICD-10-CM

## 2012-08-21 NOTE — Progress Notes (Signed)
Chief Complaint  Patient presents with  . Cough    clear liquid in her sinuses, thinks she has had temp. Started last Friday. Just feels "icky."   HPI:  Started 6 days ago with cough, runny nose (dripping water), frontal headaches.  Started as a sore throat, then progressed into this.  Had fever 101 for 2 days, still having lowgrade fevers, yesterday was 99.  Denies discolored mucus or phlegm.  Phlegm is also clear.  Denies shortness of breath, chest congestion.  Cough was a little better yesterday, able to sleep more last night than in prior nights.  She is using Allegra and Nasonex, both of which she takes regularly, not just recently restarted.  She recalls not being able to tolerate decongestants--couldn't stop moving, "like I was on speed".   Also complaining of right sided chest pain for about a month.  Worse when using her right arm a lot.  Hurts to wear bra.  Tender to touch.  Denies injury, bruising or fall.  Hasn't tried heat or NSAIDS.  Seems worse recently.  Past Medical History  Diagnosis Date  . Seasonal allergies   . Vision problem     wears contacts  . Arthritis   . Lactose intolerance   . GERD (gastroesophageal reflux disease)   . Food allergy     Scallops--no other seafood allergies  . Chronic back pain     Dr. Shelle Iron  . Chronic knee pain     Dr. Jerl Santos  . Colon polyp     Dr. Laural Benes  . Palpitations     SVT.  Dr. Clarene Duke; previously on Lanoxin  . IBS (irritable bowel syndrome)   . Impaired glucose tolerance   . Osteopenia   . Hyperlipidemia   . Migraine     resolved after menopause  . Hiatal hernia   . Milk allergy    Past Surgical History  Procedure Laterality Date  . Tonsillectomy and adenoidectomy  age 23  . Wisdom tooth extraction    . Vertebroplasty  05/2009  . Dilation and curettage of uterus  1960's  . Sebaceous cyst excision  2009    neck   History   Social History  . Marital Status: Married    Spouse Name: N/A    Number of Children: 2  .  Years of Education: N/A   Occupational History  . retired (ran [employer] with her husband)    Social History Main Topics  . Smoking status: Never Smoker   . Smokeless tobacco: Never Used  . Alcohol Use: No     Comment: maybe one glass per year.  . Drug Use: No  . Sexually Active: Not Currently   Other Topics Concern  . Not on file   Social History Narrative   Lives with her husband.  No pets.  Daughter in [place], [place]; Daughter in [place]. Cares for her 2 grandsons   Current Outpatient Prescriptions on File Prior to Visit  Medication Sig Dispense Refill  . co-enzyme Q-10 30 MG capsule Take 30 mg by mouth daily.      . fexofenadine (ALLEGRA) 180 MG tablet Take 180 mg by mouth daily.       . fluticasone (FLONASE) 50 MCG/ACT nasal spray Place 2 sprays into the nose daily.  16 g  6  . Red Yeast Rice Extract (RED YEAST RICE PO) Take 2 tablets by mouth daily.      Marland Kitchen ibuprofen (ADVIL,MOTRIN) 200 MG tablet Take 800 mg by mouth daily.       Marland Kitchen  NEXIUM 40 MG capsule TAKE ONE CAPSULE BY MOUTH ONE TIME DAILY  30 capsule  2   No current facility-administered medications on file prior to visit.   Allergies  Allergen Reactions  . Ciprofloxacin Shortness Of Breath and Rash  . Scallops (Shellfish Allergy) Anaphylaxis  . Demerol Other (See Comments)    Patient states it made her crazy and sleep for 12 hours  . Milk-Related Compounds Diarrhea  . Penicillins Other (See Comments)    unknown  . Sudafed (Pseudoephedrine Hcl) Other (See Comments)    hyperactive   ROS:  Denies nausea, vomiting, diarrhea, skin rash.  Denies shortness of breath, exertional chest pain, edema or other concerns, except as per HPI.  PHYSICAL EXAM: BP 94/60  Pulse 68  Temp(Src) 97.9 F (36.6 C) (Oral)  Ht 5\' 1"  (1.549 m)  Wt 174 lb (78.926 kg)  BMI 32.89 kg/m2 Pleasant female, with red nose from tissues, but not sneezing/sniffling or coughing.  Appears to be in no distress, younger than stated age HEENT:  PERRL,  EOMI, conjunctiva clear.  TM's and EAC's normal. OP clear.nasal mucosa mildly edematous, no purulence or erythema.  Sinuses nontender Neck: mild tender shotty lymphadenopathy Heart: regular rate and rhythm without murmur Lungs: clear bilaterally, with good air movement Chest: mildly tender in a focal area R mid chest, above breast, tender along rib. No bony stepoff or swelling.  No tenderness at costochondral junction Extremities: no edema Skin: no rash  ASSESSMENT/PLAN:  Acute upper respiratory infections of unspecified site  Chest wall pain  URI--supportive management reviewed.  Reviewed signs/symptoms of bacterial infection and to call or return if they develop  Chest wall pain, right-sided, MSK etiology.  Likely exacerbated by current cough.  Recommend trial of heat, NSAIDS (for up to a week), stretches.   Schedule yearly mammogram Return if persistent pain--may need chest x-ray with rib windows.

## 2012-08-21 NOTE — Patient Instructions (Addendum)
Right sided chest wall pain--try using warm compresses, and doing gentle stretches. Try anti-inflammatory (Advil or Aleve) regularly for up to a week--take it with food.  If pain persists, we may need to do further evaluation (ie x-rays).  Schedule your yearly mammogram.  Call if your fevers recur, discolored mucus, sinus pain, etc develops for antibiotics.

## 2012-08-25 ENCOUNTER — Other Ambulatory Visit: Payer: Self-pay

## 2012-08-25 DIAGNOSIS — Z1231 Encounter for screening mammogram for malignant neoplasm of breast: Secondary | ICD-10-CM

## 2012-09-23 ENCOUNTER — Emergency Department (HOSPITAL_COMMUNITY)
Admission: EM | Admit: 2012-09-23 | Discharge: 2012-09-23 | Disposition: A | Discharge status: Home / Self Care | Payer: Medicare Other | Attending: Emergency Medicine | Admitting: Emergency Medicine

## 2012-09-23 ENCOUNTER — Encounter (HOSPITAL_COMMUNITY): Payer: Self-pay | Admitting: Emergency Medicine

## 2012-09-23 ENCOUNTER — Emergency Department (HOSPITAL_COMMUNITY): Payer: Medicare Other

## 2012-09-23 DIAGNOSIS — R42 Dizziness and giddiness: Secondary | ICD-10-CM | POA: Insufficient documentation

## 2012-09-23 DIAGNOSIS — R0602 Shortness of breath: Secondary | ICD-10-CM | POA: Insufficient documentation

## 2012-09-23 DIAGNOSIS — Z8709 Personal history of other diseases of the respiratory system: Secondary | ICD-10-CM | POA: Insufficient documentation

## 2012-09-23 DIAGNOSIS — Z860100 Personal history of colon polyps, unspecified: Secondary | ICD-10-CM | POA: Insufficient documentation

## 2012-09-23 DIAGNOSIS — R002 Palpitations: Secondary | ICD-10-CM | POA: Insufficient documentation

## 2012-09-23 DIAGNOSIS — Z8679 Personal history of other diseases of the circulatory system: Secondary | ICD-10-CM | POA: Insufficient documentation

## 2012-09-23 DIAGNOSIS — E785 Hyperlipidemia, unspecified: Secondary | ICD-10-CM | POA: Insufficient documentation

## 2012-09-23 DIAGNOSIS — Z91013 Allergy to seafood: Secondary | ICD-10-CM | POA: Insufficient documentation

## 2012-09-23 DIAGNOSIS — R110 Nausea: Secondary | ICD-10-CM | POA: Insufficient documentation

## 2012-09-23 DIAGNOSIS — I4891 Unspecified atrial fibrillation: Secondary | ICD-10-CM

## 2012-09-23 DIAGNOSIS — Z8739 Personal history of other diseases of the musculoskeletal system and connective tissue: Secondary | ICD-10-CM | POA: Insufficient documentation

## 2012-09-23 DIAGNOSIS — Z91011 Allergy to milk products: Secondary | ICD-10-CM | POA: Insufficient documentation

## 2012-09-23 DIAGNOSIS — N390 Urinary tract infection, site not specified: Secondary | ICD-10-CM

## 2012-09-23 DIAGNOSIS — Z8719 Personal history of other diseases of the digestive system: Secondary | ICD-10-CM | POA: Insufficient documentation

## 2012-09-23 LAB — URINALYSIS, ROUTINE W REFLEX MICROSCOPIC
Bilirubin Urine: NEGATIVE
Hgb urine dipstick: NEGATIVE
Nitrite: NEGATIVE
Protein, ur: NEGATIVE mg/dL
Specific Gravity, Urine: 1.007 (ref 1.005–1.030)
Urobilinogen, UA: 0.2 mg/dL (ref 0.0–1.0)

## 2012-09-23 LAB — COMPREHENSIVE METABOLIC PANEL
ALT: 18 U/L (ref 0–35)
BUN: 13 mg/dL (ref 6–23)
CO2: 28 mEq/L (ref 19–32)
Calcium: 9.4 mg/dL (ref 8.4–10.5)
GFR calc Af Amer: 77 mL/min — ABNORMAL LOW (ref >90)
GFR calc non Af Amer: 67 mL/min — ABNORMAL LOW (ref >90)
Glucose, Bld: 126 mg/dL — ABNORMAL HIGH (ref 70–99)
Sodium: 140 mEq/L (ref 135–145)
Total Protein: 6.9 g/dL (ref 6.0–8.3)

## 2012-09-23 LAB — CBC WITH DIFFERENTIAL/PLATELET
Eosinophils Absolute: 0.1 10*3/uL (ref 0.0–0.7)
Eosinophils Relative: 1 % (ref 0–5)
HCT: 39.9 % (ref 36.0–46.0)
Hemoglobin: 13.6 g/dL (ref 12.0–15.0)
Lymphocytes Relative: 18 % (ref 12–46)
Lymphs Abs: 1.4 10*3/uL (ref 0.7–4.0)
MCH: 29.7 pg (ref 26.0–34.0)
MCV: 87.1 fL (ref 78.0–100.0)
Monocytes Absolute: 0.5 10*3/uL (ref 0.1–1.0)
Monocytes Relative: 6 % (ref 3–12)
RBC: 4.58 MIL/uL (ref 3.87–5.11)
WBC: 8.2 10*3/uL (ref 4.0–10.5)

## 2012-09-23 LAB — TROPONIN I: Troponin I: <0.3 ng/mL (ref <0.30)

## 2012-09-23 MED ORDER — METOPROLOL TARTRATE 12.5 MG HALF TABLET: 12.5000 mg | ORAL_TABLET | Freq: Two times a day (BID) | ORAL | Status: DC

## 2012-09-23 MED ORDER — CEPHALEXIN 500 MG PO CAPS: 500.0000 mg | ORAL_CAPSULE | Freq: Four times a day (QID) | ORAL | Status: DC

## 2012-09-23 NOTE — ED Notes (Signed)
Discharge instructions reviewed, pt verbalized understanding.

## 2012-09-23 NOTE — ED Provider Notes (Signed)
History     CSN: [id number]  Arrival date & time 09/23/12  1533   First MD Initiated Contact with Patient 09/23/12 1540      Chief Complaint  Patient presents with  . Atrial Fibrillation    (Consider location/radiation/quality/duration/timing/severity/associated sxs/prior treatment) HPI Comments: Patient presents from PCPs office with episode of dizziness, palpitations, shortness of breath that lasted 60-90 minutes. She was with her husband at the doctor and began to feel unwell. She was found to be atrial fibrillation rate of 160. She has since converted to normal sinus and feels back to baseline. She denies any history of atrial fibrillation but has had "tachycardia" in the past. She seen Dr. little but now was to transfer to Dr. Graciela Husbands. She denies any chest pain, abdominal pain, cough or fever. Good by mouth intake and urine output. She states her only other medical problems allergies. No new medications  The history is provided by the patient.    Past Medical History  Diagnosis Date  . Seasonal allergies   . Vision problem     wears contacts  . Arthritis   . Lactose intolerance   . GERD (gastroesophageal reflux disease)   . Food allergy     Scallops--no other seafood allergies  . Chronic back pain     Dr. Shelle Iron  . Chronic knee pain     Dr. Jerl Santos  . Colon polyp     Dr. Laural Benes  . Palpitations     [facility].  Dr. Clarene Duke; previously on Lanoxin  . IBS (irritable bowel syndrome)   . Impaired glucose tolerance   . Osteopenia   . Hyperlipidemia   . Migraine     resolved after menopause  . Hiatal hernia   . Milk allergy     Past Surgical History  Procedure Laterality Date  . Tonsillectomy and adenoidectomy  age 61  . Wisdom tooth extraction    . Vertebroplasty  05/2009  . Dilation and curettage of uterus  1960's  . Sebaceous cyst excision  2009    neck    Family History  Problem Relation Age of Onset  . Rheumatic fever Mother   . Diabetes Mother   . Heart disease  Mother     rheumatic heart disease  . Cancer Sister 23    breast cancer  . Cancer Brother     bladder and prostate cancer  . Migraines Daughter   . Cancer Sister     leukemia at 56; colon cancer in her late 30's--family ?'s dx    History  Substance Use Topics  . Smoking status: Never Smoker   . Smokeless tobacco: Never Used  . Alcohol Use: No     Comment: maybe one glass per year.    OB History   Grav Para Term Preterm Abortions TAB SAB Ect Mult Living                  Review of Systems  Constitutional: Negative for fever, activity change and appetite change.  HENT: Negative for congestion and rhinorrhea.   Respiratory: Positive for shortness of breath. Negative for cough and chest tightness.   Cardiovascular: Positive for palpitations. Negative for chest pain.  Gastrointestinal: Positive for nausea. Negative for vomiting and abdominal pain.  Genitourinary: Negative for dysuria, vaginal bleeding and vaginal discharge.  Musculoskeletal: Negative for back pain.  Skin: Negative for rash.  Neurological: Positive for dizziness and light-headedness. Negative for weakness and headaches.   A complete 10 system review of systems  was obtained and all systems are negative except as noted in the HPI and PMH.   Allergies  Ciprofloxacin; Scallops; Demerol; Milk-related compounds; Penicillins; and Sudafed  Home Medications   Current Outpatient Rx  Name  Route  Sig  Dispense  Refill  . cholecalciferol (VITAMIN D) 1000 UNITS tablet   Oral   Take 1,000 Units by mouth daily.         . fluticasone (FLONASE) 50 MCG/ACT nasal spray   Nasal   Place 2 sprays into the nose daily.         Marland Kitchen ibuprofen (ADVIL,MOTRIN) 200 MG tablet   Oral   Take 800 mg by mouth daily.          . cephALEXin (KEFLEX) 500 MG capsule   Oral   Take 1 capsule (500 mg total) by mouth 4 (four) times daily.   40 capsule   0     BP 139/64  Pulse 74  Temp(Src) 97.9 F (36.6 C) (Oral)  Resp 17   SpO2 94%  Physical Exam  Constitutional: She is oriented to person, place, and time. She appears well-developed and well-nourished. No distress.  HENT:  Head: Normocephalic.  Mouth/Throat: Oropharynx is clear and moist. No oropharyngeal exudate.  Eyes: Conjunctivae and EOM are normal. Pupils are equal, round, and reactive to light.  Neck: Normal range of motion.  Cardiovascular: Normal rate, regular rhythm, normal heart sounds and intact distal pulses.   No murmur heard. Pulmonary/Chest: Effort normal and breath sounds normal. No respiratory distress.  Abdominal: Soft. Bowel sounds are normal. There is no tenderness. There is no rebound and no guarding.  Musculoskeletal: Normal range of motion. She exhibits no edema and no tenderness.  Neurological: She is alert and oriented to person, place, and time. No cranial nerve deficit. Coordination normal.  Skin: Skin is warm.    ED Course  Procedures (including critical care time)  Labs Reviewed  DIGOXIN LEVEL - Abnormal; Notable for the following:    Digoxin Level <0.3 (*)    All other components within normal limits  COMPREHENSIVE METABOLIC PANEL - Abnormal; Notable for the following:    Glucose, Bld 126 (*)    Albumin 3.4 (*)    Total Bilirubin 0.2 (*)    GFR calc non Af Amer 67 (*)    GFR calc Af Amer 77 (*)    All other components within normal limits  URINALYSIS, ROUTINE W REFLEX MICROSCOPIC - Abnormal; Notable for the following:    Leukocytes, UA LARGE (*)    All other components within normal limits  URINE MICROSCOPIC-ADD ON - Abnormal; Notable for the following:    Squamous Epithelial / LPF FEW (*)    Bacteria, UA FEW (*)    All other components within normal limits  URINE CULTURE  CBC WITH DIFFERENTIAL  TROPONIN I   Dg Chest 2 View  09/23/2012  *RADIOLOGY REPORT*  Clinical Data: Palpitations, dizziness, lightheadedness  CHEST - 2 VIEW  Comparison: None.  Findings:  Borderline enlarged cardiac silhouette and mediastinal  contours. There is mild elevation right hemidiaphragm.  No focal parenchymal opacities.  No pleural effusion or pneumothorax.  No definite evidence of edema.  Presumed sequela of prior vertebroplasty/kyphoplasty within the lower thoracic / upper lumbar spine.  IMPRESSION: 1.  Borderline enlarged cardiac silhouette without acute cardiopulmonary disease. 2.  Presumed sequela of prior vertebroplasty/kyphoplasty with the lower thoracic / upper lumbar spine.   Original Report Authenticated By: Tacey Ruiz, MD  1. Atrial fibrillation   2. Urinary tract infection       MDM  Episode of atrial fibrillation with palpitations shortness of breath, now resolved back to baseline. Denies complaints. No chest pain or shortness of breath. Sinus tach on her EKG.  Electrolytes within normal limits. Patient maintained sinus rhythm in the ED.  Discussed with PA Barrett of cardiology. She will schedule patient for appointment this week. We'll not start low-dose beta blocker as patient states her blood pressure home runs on the low side in the 100s to 110s.    Date: 09/23/2012  Rate: 88  Rhythm: normal sinus rhythm  QRS Axis: normal  Intervals: normal  ST/T Wave abnormalities: normal  Conduction Disutrbances:none  Narrative Interpretation:   Old EKG Reviewed: none available    Glynn Octave, MD 09/23/12 2045

## 2012-09-23 NOTE — ED Notes (Signed)
Per EMS pt was at dr office with husband and began to feel bad. She felt weak and dizzy with sob. Denies cp, nausea, and vomiting. Pt was put on monitor and was in atrial fibrillation with hr around 160. Pt has no cardiac hx, takes no medications. Pt converted on 12 lead before arrival to ER. 20g in left hand. Last b/p was 154/84.

## 2012-09-23 NOTE — ED Notes (Signed)
Pt returned from xray

## 2012-09-23 NOTE — ED Notes (Signed)
Pt denies any sob, cp, and any other complaints at this time.

## 2012-09-23 NOTE — ED Notes (Signed)
Pt undressed, in gown, on monitor, continuous pulse oximetry and blood pressure cuff; husband at bedside

## 2012-09-24 ENCOUNTER — Telehealth: Payer: Self-pay | Admitting: Internal Medicine

## 2012-09-24 LAB — URINE CULTURE

## 2012-09-24 NOTE — Telephone Encounter (Signed)
Routing to SK to determine if he wants to work patient in or wants her to see someone else.

## 2012-09-24 NOTE — Telephone Encounter (Signed)
Spoke with patient to inform her that 4/17 is the next time Dr. Graciela Husbands is in the office and would she like that appointment or to see a different provider sooner.  Patient states she is happy to wait for Dr. Graciela Husbands on 4/17.  Stanton Kidney in [facility] notified.

## 2012-09-24 NOTE — Telephone Encounter (Signed)
New problem  Per after hours vm per Bjorn Loser PA pt new pt need appt this week if possible. Called pt and offer her appt with Dr Elease Hashimoto on 09/26/12 and pt refused and stated she want to see Dr Graciela Husbands. Dr Graciela Husbands opening isn't til 4/17. Please advise   Attaching ED notes:   Follow up with Sherryl Manges, MD. Schedule an appointment as soon as possible for a visit in 2 days

## 2012-09-25 ENCOUNTER — Ambulatory Visit: Payer: Medicare Other

## 2012-10-09 ENCOUNTER — Ambulatory Visit (INDEPENDENT_AMBULATORY_CARE_PROVIDER_SITE_OTHER): Payer: Medicare Other | Admitting: Internal Medicine

## 2012-10-09 ENCOUNTER — Encounter: Payer: Self-pay | Admitting: Internal Medicine

## 2012-10-09 VITALS — BP 128/79 | HR 69 | Ht 62.0 in | Wt 173.1 lb

## 2012-10-09 DIAGNOSIS — I4891 Unspecified atrial fibrillation: Secondary | ICD-10-CM

## 2012-10-09 LAB — TSH: TSH: 1.1 u[IU]/mL (ref 0.35–5.50)

## 2012-10-09 NOTE — Progress Notes (Addendum)
ELECTROPHYSIOLOGY CONSULT NOTE  Patient ID: Samantha Foley, MRN: [id number], DOB/AGE: 08/10/1929 79 y.o. Admit date: (Not on file) Date of Consult: 10/09/2012  Primary Physician: Enrique Sack, MD Primary Cardiologist:  New  Chief Complaint:  New Afib     HPI Samantha Foley is a 79 y.o. female was seen with histroy of afib  Unfortunately, i am seeing th void of this note on May 12 and do not recall her history  We will have to regroup when she returns for followup     Past Medical History  Diagnosis Date  . Seasonal allergies   . Vision problem     wears contacts  . Arthritis   . Lactose intolerance   . GERD (gastroesophageal reflux disease)   . Food allergy     Scallops--no other seafood allergies  . Chronic back pain     Dr. Shelle Iron  . Chronic knee pain     Dr. Jerl Santos  . Colon polyp     Dr. Laural Benes  . Palpitations     [facility].  Dr. Clarene Duke; previously on Lanoxin  . IBS (irritable bowel syndrome)   . Impaired glucose tolerance   . Osteopenia   . Hyperlipidemia   . Migraine     resolved after menopause  . Hiatal hernia   . Milk allergy       Surgical History:  Past Surgical History  Procedure Laterality Date  . Tonsillectomy and adenoidectomy  age 50  . Wisdom tooth extraction    . Vertebroplasty  05/2009  . Dilation and curettage of uterus  1960's  . Sebaceous cyst excision  2009    neck     Home Meds: Prior to Admission medications   Medication Sig Start Date End Date Taking? Authorizing Provider  aspirin 325 MG tablet Take 325 mg by mouth daily.   Yes Historical Provider, MD  cholecalciferol (VITAMIN D) 1000 UNITS tablet Take 1,000 Units by mouth daily.   Yes Historical Provider, MD  Fexofenadine HCl (ALLEGRA PO) Take 1 tablet by mouth daily.   Yes Historical Provider, MD  fluticasone (FLONASE) 50 MCG/ACT nasal spray Place 2 sprays into the nose daily. 10/11/11 10/10/12 Yes Joselyn Arrow, MD  ibuprofen (ADVIL,MOTRIN) 200 MG tablet Take 800 mg by mouth  daily.    Yes Historical Provider, MD      Allergies:  Allergies  Allergen Reactions  . Ciprofloxacin Shortness Of Breath and Rash  . Scallops (Shellfish Allergy) Anaphylaxis  . Demerol Other (See Comments)    Patient states it made her crazy and sleep for 12 hours  . Milk-Related Compounds Diarrhea  . Penicillins Other (See Comments)    unknown  . Sudafed (Pseudoephedrine Hcl) Other (See Comments)    hyperactive    History   Social History  . Marital Status: Married    Spouse Name: N/A    Number of Children: 2  . Years of Education: N/A   Occupational History  . retired (ran [employer] with her husband)    Social History Main Topics  . Smoking status: Never Smoker   . Smokeless tobacco: Never Used  . Alcohol Use: No     Comment: maybe one glass per year.  . Drug Use: No  . Sexually Active: Not Currently   Other Topics Concern  . Not on file   Social History Narrative   Lives with her husband.  No pets.  Daughter in [place], [place]; Daughter in [place]. Cares for her 2 grandsons  Family History  Problem Relation Age of Onset  . Rheumatic fever Mother   . Diabetes Mother   . Heart disease Mother     rheumatic heart disease  . Cancer Sister 20    breast cancer  . Cancer Brother     bladder and prostate cancer  . Migraines Daughter   . Cancer Sister     leukemia at 77; colon cancer in her late 30's--family ?'s dx     ROS:  Please see the history of present illness.     All other systems reviewed and negative.    Physical Exam:   Blood pressure 128/79, pulse 69, height 5\' 2"  (1.575 m), weight 173 lb 1.9 oz (78.527 kg). General: Well developed, well nourished younger than age appearing caucasian female in no acute distress. Head: Normocephalic, atraumatic, sclera non-icteric, no xanthomas, nares are without discharge. EENT: normal Lymph Nodes:  none Back: without scoliosis/kyphosis , no CVA tendersness Neck: Negative for carotid bruits. JVD not  elevated. Lungs: Clear bilaterally to auscultation without wheezes, rales, or rhonchi. Breathing is unlabored. Heart: RRR with S1 S2. No murmur , rubs, or gallops appreciated. Abdomen: Soft, non-tender, non-distended with normoactive bowel sounds. No hepatomegaly. No rebound/guarding. No obvious abdominal masses. Msk:  Strength and tone appear normal for age. Extremities: No clubbing or cyanosis. No edema.  Distal pedal pulses are 2+ and equal bilaterally. Skin: Warm and Dry Neuro: Alert and oriented X 3. CN III-XII intact Grossly normal sensory and motor function . Psych:  Responds to questions appropriately with a normal affect.      Labs: Cardiac Enzymes No results found for this basename: CKTOTAL, CKMB, TROPONINI,  in the last 72 hours CBC Lab Results  Component Value Date   WBC 8.2 09/23/2012   HGB 13.6 09/23/2012   HCT 39.9 09/23/2012   MCV 87.1 09/23/2012   PLT 197 09/23/2012   PROTIME: No results found for this basename: LABPROT, INR,  in the last 72 hours Chemistry No results found for this basename: NA, K, CL, CO2, BUN, CREATININE, CALCIUM, LABALBU, PROT, BILITOT, ALKPHOS, ALT, AST, GLUCOSE,  in the last 168 hours Lipids Lab Results  Component Value Date   CHOL 209* 05/26/2012   HDL 46 05/26/2012   LDLCALC 145* 05/26/2012   TRIG 88 05/26/2012   BNP No results found for this basename: probnp   Miscellaneous No results found for this basename: DDIMER    Radiology/Studies:  Dg Chest 2 View  09/23/2012  *RADIOLOGY REPORT*  Clinical Data: Palpitations, dizziness, lightheadedness  CHEST - 2 VIEW  Comparison: None.  Findings:  Borderline enlarged cardiac silhouette and mediastinal contours. There is mild elevation right hemidiaphragm.  No focal parenchymal opacities.  No pleural effusion or pneumothorax.  No definite evidence of edema.  Presumed sequela of prior vertebroplasty/kyphoplasty within the lower thoracic / upper lumbar spine.  IMPRESSION: 1.  Borderline enlarged cardiac  silhouette without acute cardiopulmonary disease. 2.  Presumed sequela of prior vertebroplasty/kyphoplasty with the lower thoracic / upper lumbar spine.   Original Report Authenticated By: Tacey Ruiz, MD     EKG: * sinus rhythm  Assessment and Plan:    Samantha Foley

## 2012-10-09 NOTE — Patient Instructions (Signed)
Your physician has requested that you have an echocardiogram. Echocardiography is a painless test that uses sound waves to create images of your heart. It provides your doctor with information about the size and shape of your heart and how well your heart's chambers and valves are working. This procedure takes approximately one hour. There are no restrictions for this procedure.  Your physician recommends that you have lab work today: TSH  Your physician recommends that you schedule a follow-up appointment in: 2 months with Dr. Graciela Husbands.

## 2012-10-09 NOTE — Assessment & Plan Note (Signed)
The patient has infrequent episodes of what apparently has been documented as atrial fibrillation although she also has a diagnosis from the emergency room of [facility]. We wait the tracings from Dr. Thomasene Lot office.  We have discussed the potential role issues related to atrial fibrillation including thromboembolic risk, her CHADS-  score is 1 at her CHADS-VASc score is 3, and anticoagulation is more appropriate than aspirin. She will check with her pharmacy as relates to the cost  Of Rivaroxaban or apixaban. We will avoid dabigitran because of GI issues.  We'll also check an echocardiogram to look at left ventricular function and left atrial size. TSH is appropriate and was not drawn in the emergency room.  These episodes are largely triggered by exposure to caffeine in the form of coffee and/or sodas. As for now we'll simply refraining from ingestion and see what the frequency of these spells is prior to initiating therapy.

## 2012-10-10 ENCOUNTER — Telehealth: Payer: Self-pay | Admitting: Internal Medicine

## 2012-10-10 NOTE — Telephone Encounter (Signed)
Spoke with Dr Graciela Husbands and he says ok and to send in the script and we will give her a discount card also, she would like this mailed to her.

## 2012-10-10 NOTE — Telephone Encounter (Signed)
New Problem:    Patient called in wanting to tell Dr. Emi Holes would work the best for her.  Please call back.

## 2012-10-22 ENCOUNTER — Other Ambulatory Visit: Payer: Self-pay | Admitting: *Deleted

## 2012-10-22 DIAGNOSIS — I4891 Unspecified atrial fibrillation: Secondary | ICD-10-CM

## 2012-10-22 MED ORDER — RIVAROXABAN 20 MG PO TABS: 20.0000 mg | ORAL_TABLET | Freq: Every day | ORAL | Status: DC

## 2012-10-27 ENCOUNTER — Ambulatory Visit
Admission: RE | Admit: 2012-10-27 | Discharge: 2012-10-27 | Disposition: A | Discharge status: Home / Self Care | Payer: Medicare Other | Source: Ambulatory Visit

## 2012-10-27 DIAGNOSIS — Z1231 Encounter for screening mammogram for malignant neoplasm of breast: Secondary | ICD-10-CM

## 2012-10-30 ENCOUNTER — Ambulatory Visit (HOSPITAL_COMMUNITY): Payer: Medicare Other | Attending: Cardiovascular Disease

## 2012-10-30 ENCOUNTER — Ambulatory Visit (INDEPENDENT_AMBULATORY_CARE_PROVIDER_SITE_OTHER): Payer: Medicare Other | Admitting: *Deleted

## 2012-10-30 DIAGNOSIS — R000 Tachycardia, unspecified: Secondary | ICD-10-CM

## 2012-10-30 DIAGNOSIS — I4891 Unspecified atrial fibrillation: Secondary | ICD-10-CM | POA: Insufficient documentation

## 2012-10-30 DIAGNOSIS — I471 Supraventricular tachycardia, unspecified: Secondary | ICD-10-CM | POA: Insufficient documentation

## 2012-10-30 DIAGNOSIS — E785 Hyperlipidemia, unspecified: Secondary | ICD-10-CM | POA: Insufficient documentation

## 2012-10-30 MED ORDER — METOPROLOL SUCCINATE ER 25 MG PO TB24: 25.0000 mg | ORAL_TABLET | Freq: Every day | ORAL | Status: DC

## 2012-10-30 NOTE — Progress Notes (Signed)
Echocardiogram performed.  

## 2012-10-30 NOTE — Progress Notes (Signed)
Pt here for echo, per the tech her heart rate is ranging from 80's to 170's. EKG done to confirm sinus rhythm. Per dr Jens Som the pt will start toprol 25 mg once daily. She has a follow up scheduled with dr Graciela Husbands.

## 2012-10-31 ENCOUNTER — Telehealth: Payer: Self-pay | Admitting: Internal Medicine

## 2012-10-31 NOTE — Telephone Encounter (Signed)
Pt nervous about taking metoprolol because it will drop her bp too low.  Advised pt to take metoprolol as ordered and keep a check on her BP and call us if it gets too low.  Pt agreed.

## 2012-11-05 ENCOUNTER — Telehealth: Payer: Self-pay | Admitting: Internal Medicine

## 2012-11-05 NOTE — Telephone Encounter (Signed)
Spoke with pt, aware of normal echo results 

## 2012-11-05 NOTE — Telephone Encounter (Signed)
New Problem:    Patient called in returning your call. Please call back. 

## 2012-11-11 ENCOUNTER — Telehealth: Payer: Self-pay | Admitting: Internal Medicine

## 2012-11-11 NOTE — Telephone Encounter (Signed)
Spoke with pt, she reports she is in rhythm and has been for about three weeks now. Per dr Graciela Husbands the pt will stop her metoprolol. If the cramps do not improve or her heart rate gets too fast she will let us know.

## 2012-11-11 NOTE — Telephone Encounter (Signed)
New problem   Per pt she's having symptoms from xarelto & metoprolol-pt can also be reached at [phone]

## 2012-11-11 NOTE — Telephone Encounter (Signed)
Spoke with pt, she started xarelto may 10th and started metoprolol may 12 th. For about one week now she is having very bad leg cramps that wake her from her sleep. They have gradually gotten worse, are very painful and last a long time. She usually drinks quinine water and that helps but nothing is helping with these cramps.

## 2012-11-22 ENCOUNTER — Ambulatory Visit (INDEPENDENT_AMBULATORY_CARE_PROVIDER_SITE_OTHER): Payer: Medicare Other

## 2012-11-22 ENCOUNTER — Telehealth: Payer: Self-pay | Admitting: Physician Assistant

## 2012-11-22 NOTE — Telephone Encounter (Signed)
Patient called into answering service reporting severe leg cramps that kept her up last night. She was previously on metoprolol for afib but called Dr. Graciela Husbands several weeks ago for intermittent leg cramps, and this was stopped. Stopping the medicine has not seemed to help. She also has h/o severe leg cramps while being on Forteo remotely. She states her HR is controlled. Last labwork was in 09/2012 with normal potassium. I am not sure I have much to offer over the phone since I don't have a clear cause for her leg cramps. Differential diagnosis could include more serious things like rhabdo, DVT, or hypokalemia, thus I advised her to proceed to urgent care this morning. She verbalized understanding and gratitude. Dayna Dunn PA-C

## 2012-11-23 ENCOUNTER — Ambulatory Visit (INDEPENDENT_AMBULATORY_CARE_PROVIDER_SITE_OTHER): Payer: Medicare Other | Admitting: Family Medicine

## 2012-11-23 VITALS — BP 131/81 | HR 74 | Temp 97.9°F | Resp 18 | Ht 61.5 in | Wt 173.0 lb

## 2012-11-23 DIAGNOSIS — I4891 Unspecified atrial fibrillation: Secondary | ICD-10-CM

## 2012-11-23 DIAGNOSIS — R252 Cramp and spasm: Secondary | ICD-10-CM

## 2012-11-23 LAB — POCT CBC
Granulocyte percent: 75.6 %G (ref 37–80)
Hemoglobin: 14.1 g/dL (ref 12.2–16.2)
MCH, POC: 29.7 pg (ref 27–31.2)
MCV: 94.1 fL (ref 80–97)
MID (cbc): 0.4 (ref 0–0.9)
MPV: 8.2 fL (ref 0–99.8)
POC MID %: 5.6 %M (ref 0–12)
Platelet Count, POC: 239 10*3/uL (ref 142–424)
RBC: 4.75 M/uL (ref 4.04–5.48)
WBC: 7.4 10*3/uL (ref 4.6–10.2)

## 2012-11-23 LAB — COMPREHENSIVE METABOLIC PANEL
ALT: 19 U/L (ref 0–35)
AST: 18 U/L (ref 0–37)
Albumin: 4.3 g/dL (ref 3.5–5.2)
CO2: 29 mEq/L (ref 19–32)
Calcium: 9.3 mg/dL (ref 8.4–10.5)
Chloride: 104 mEq/L (ref 96–112)
Creat: 0.78 mg/dL (ref 0.50–1.10)
Potassium: 4.3 mEq/L (ref 3.5–5.3)

## 2012-11-23 LAB — MAGNESIUM: Magnesium: 2.1 mg/dL (ref 1.5–2.5)

## 2012-11-23 NOTE — Patient Instructions (Addendum)
Leg Cramps  Leg cramps that occur during exercise can be caused by poor circulation or dehydration. However, muscle cramps that occur at rest or during the night are usually not due to any serious medical problem. Heat cramps may cause muscle spasms during hot weather.   CAUSES  There is no clear cause for muscle cramps. However, dehydration may be a factor for those who do not drink enough fluids and those who exercise in the heat. Imbalances in the level of sodium, potassium, calcium or magnesium in the muscle tissue may also be a factor. Some medications, such as water pills (diuretics), may cause loss of chemicals that the body needs (like sodium and potassium) and cause muscle cramps.  TREATMENT   · Make sure your diet has enough fluids and essential minerals for the muscle to work normally.  · Avoid strenuous exercise for several days if you have been having frequent leg cramps.  · Stretch and massage the cramped muscle for several minutes.  · Some medicines may be helpful in some patients with night cramps. Only take over-the-counter or prescription medicines as directed by your caregiver.  SEEK IMMEDIATE MEDICAL CARE IF:   · Your leg cramps become worse.  · Your foot becomes cold, numb, or blue.  Document Released: 07/19/2004 Document Revised: 09/03/2011 Document Reviewed: 07/06/2008  [employer]® Patient Information ©2014 [employer].

## 2012-11-23 NOTE — Progress Notes (Signed)
[address]   [place], [place]  [address]   [phone]  Subjective:    Patient ID: Samantha Foley, female    DOB: 1930-05-30, 79 y.o.   MRN: [id number]  HPI This 79 y.o. female presents for evaluation of leg cramps. Just diagnosed with atrial fibrillation one month ago; initiated on Metoprolol and Xeralto. Two days after starting medications, developed horrible leg cramps. History of recurrent leg cramps.   Started Gibson Ramp 5/10; started Metoprolol 5/12; Stopped Metoprolol on 11/11/2012.  No change in other medications.  No leg swelling.  Duration of one hour on Friday night; two days ago.  Skipped Xeralto last night.  No change in activity level; no risk of dehydration; no recent sedentary lifestyle. No leg swelling; no persistent leg pain. No redness to calves.  No chest pain, palpitations, SOB.  Atrial fibrillation broke in ED after onset.  Called [facility] who recommended evaluation today at [facility] for labs.   Review of Systems  Constitutional: Negative for fever, chills, diaphoresis and fatigue.  Respiratory: Negative for cough and shortness of breath.   Cardiovascular: Negative for chest pain, palpitations and leg swelling.  Skin: Negative for color change, pallor, rash and wound.    Past Medical History  Diagnosis Date  . Seasonal allergies   . Vision problem     wears contacts  . Arthritis   . Lactose intolerance   . GERD (gastroesophageal reflux disease)   . Food allergy     Scallops--no other seafood allergies  . Chronic back pain     Dr. Shelle Iron  . Chronic knee pain     Dr. Jerl Santos  . Colon polyp     Dr. Laural Benes  . PVC's (premature ventricular contractions)   . IBS (irritable bowel syndrome)   . Impaired glucose tolerance   . Osteopenia   . Hyperlipidemia   . Migraine     resolved after menopause  . Hiatal hernia   . Atrial fibrillation     reported but not yet documented    Past Surgical History  Procedure Laterality Date  . Tonsillectomy and  adenoidectomy  age 40  . Wisdom tooth extraction    . Vertebroplasty  05/2009  . Dilation and curettage of uterus  1960's  . Sebaceous cyst excision  2009    neck    Prior to Admission medications   Medication Sig Start Date End Date Taking? Authorizing Provider  cholecalciferol (VITAMIN D) 1000 UNITS tablet Take 1,000 Units by mouth daily.   Yes Historical Provider, MD  Fexofenadine HCl (ALLEGRA PO) Take 1 tablet by mouth daily.   Yes Historical Provider, MD  Rivaroxaban (XARELTO) 20 MG TABS Take 1 tablet (20 mg total) by mouth daily. 10/22/12  Yes Duke Salvia, MD  aspirin 325 MG tablet Take 325 mg by mouth daily.    Historical Provider, MD  fluticasone (FLONASE) 50 MCG/ACT nasal spray Place 2 sprays into the nose daily. 10/11/11 10/10/12  Joselyn Arrow, MD  ibuprofen (ADVIL,MOTRIN) 200 MG tablet Take 800 mg by mouth daily.     Historical Provider, MD    Allergies  Allergen Reactions  . Ciprofloxacin Shortness Of Breath and Rash  . Scallops (Shellfish Allergy) Anaphylaxis  . Demerol Other (See Comments)    Patient states it made her crazy and sleep for 12 hours  . Milk-Related Compounds Diarrhea  . Penicillins Other (See Comments)    unknown  . Sudafed (Pseudoephedrine Hcl) Other (See Comments)    hyperactive  History   Social History  . Marital Status: Married    Spouse Name: N/A    Number of Children: 2  . Years of Education: N/A   Occupational History  . retired (ran [employer] with her husband)    Social History Main Topics  . Smoking status: Never Smoker   . Smokeless tobacco: Never Used  . Alcohol Use: No     Comment: maybe one glass per year.  . Drug Use: No  . Sexually Active: Not Currently   Other Topics Concern  . Not on file   Social History Narrative   Lives with her husband.  No pets.  Daughter in [place], [place]; Daughter in [place]. Cares for her 2 grandsons    Family History  Problem Relation Age of Onset  . Rheumatic fever Mother   . Diabetes  Mother   . Heart disease Mother     rheumatic heart disease  . Cancer Sister 101    breast cancer  . Cancer Brother     bladder and prostate cancer  . Migraines Daughter   . Cancer Sister     leukemia at 80; colon cancer in her late 30's--family ?'s dx       Objective:   Physical Exam  Nursing note and vitals reviewed. Constitutional: She appears well-developed and well-nourished. No distress.  Cardiovascular: Normal rate, regular rhythm, normal heart sounds and intact distal pulses.  Exam reveals no gallop and no friction rub.   No murmur heard. Pulmonary/Chest: Effort normal and breath sounds normal. She has no wheezes. She has no rales.  Musculoskeletal:       Right knee: Normal.       Left knee: Normal.       Right ankle: Normal.       Left ankle: Normal.       Right lower leg: Normal.       Left lower leg: Normal.  Skin: She is not diaphoretic.        Assessment & Plan:  Calf cramp  Leg cramp - Plan: POCT CBC, Comprehensive metabolic panel, Magnesium, CANCELED: POCT urinalysis dipstick, CANCELED: POCT UA - Microscopic Only  Atrial fibrillation - Plan: Comprehensive metabolic panel   1. Leg cramps:  Recurrent issue for patient since starting Xeralto.  Obtain labs.  Follow-up with cardiology this week to discuss other treatment options. 2.  Atrial fibrillation: New onset; NSR today; continue Xeralto until appointment with cardiology.  No improvement in leg cramps with cessation of Metoprolol.

## 2012-11-24 ENCOUNTER — Telehealth: Payer: Self-pay | Admitting: Internal Medicine

## 2012-11-24 MED ORDER — APIXABAN 5 MG PO TABS: 5.0000 mg | ORAL_TABLET | Freq: Two times a day (BID) | ORAL | Status: DC

## 2012-11-24 NOTE — Telephone Encounter (Signed)
Spoke with pt, she cont to have severe leg cramps. Her labs from urgent care were normal. Will try changing the pt to eliquis to see if the xarelto is causing the problem. Script sent to pharm for eliquis 5 mg bid and 30 day free trial card given to pt. She will let us know if her symptoms improve. She asked about skipping a day. Encouraged pt to take the first dose of eliquis the next time the xarelto is due. Pt voiced understanding.

## 2012-11-24 NOTE — Telephone Encounter (Signed)
New Problem  Pt states that she was prescribed Xarelto and since then she has been have terrible legs cramps. She said she was advised last week to go to urgent care and she did. She said she went to the one on [address]. She said that the leg cramps are still there and she wants to know what can she do.

## 2012-12-04 ENCOUNTER — Telehealth: Payer: Self-pay | Admitting: Internal Medicine

## 2012-12-04 NOTE — Telephone Encounter (Signed)
Ok to hold apixoban would do so for least amount oftime but 3doses should be more than enough prior to injection and then resume when ok with ortho/radiology

## 2012-12-04 NOTE — Telephone Encounter (Signed)
Pt is taken Eliquis 5 mg twice a day since  May 31 st 2014. Pt states has been having pain in her shoulder. Pt's orthopedic MD recommended for pt to have a Cortisone injection for bursitis. Pt would like to know if it is okay to have the injection taken the Eliquis  or she needs to stop the medication prior the procedure . Pt would like to know this  before she schedule the procedure.

## 2012-12-04 NOTE — Telephone Encounter (Signed)
New Problem  Pt states she just started taking ELIQUIS and she has been having pain in her shoulder. She said that her doctor wants to give her an injection for pain but she said that upon reading the information about  ELIQUIS states she can not get injections. She asked if you could call her back to clarify.

## 2012-12-04 NOTE — Telephone Encounter (Signed)
I left a message for the patient to call. 

## 2012-12-08 NOTE — Telephone Encounter (Signed)
I spoke with the patient and made her aware that she can have her injection and if she needs to hold Eliquis, Dr. Graciela Husbands has recommended holding no more than 3 doses. She voices understanding.

## 2012-12-15 ENCOUNTER — Telehealth: Payer: Self-pay | Admitting: Internal Medicine

## 2012-12-15 NOTE — Telephone Encounter (Signed)
New Prob    Pt has some questions regarding a medication Everlene Balls) she is taking. Please call.

## 2012-12-15 NOTE — Telephone Encounter (Signed)
Spoke with pt, samples placed at the front desk for pick up 

## 2012-12-18 ENCOUNTER — Ambulatory Visit (INDEPENDENT_AMBULATORY_CARE_PROVIDER_SITE_OTHER): Payer: Medicare Other | Admitting: Internal Medicine

## 2012-12-18 ENCOUNTER — Encounter: Payer: Self-pay | Admitting: Internal Medicine

## 2012-12-18 VITALS — BP 158/80 | HR 75 | Ht 61.0 in | Wt 177.0 lb

## 2012-12-18 DIAGNOSIS — S4990XA Unspecified injury of shoulder and upper arm, unspecified arm, initial encounter: Secondary | ICD-10-CM | POA: Insufficient documentation

## 2012-12-18 DIAGNOSIS — S4980XA Other specified injuries of shoulder and upper arm, unspecified arm, initial encounter: Secondary | ICD-10-CM

## 2012-12-18 DIAGNOSIS — I4891 Unspecified atrial fibrillation: Secondary | ICD-10-CM

## 2012-12-18 DIAGNOSIS — S4991XA Unspecified injury of right shoulder and upper arm, initial encounter: Secondary | ICD-10-CM

## 2012-12-18 NOTE — Assessment & Plan Note (Signed)
She has a shoulder problem which is apparently noninflammatory. Her question is about pain medication. I suggested perhaps in the very short term and NSAID may be helpful but that she should contact Dr. Chilton Si and/or Shelle Iron for alternative antipain medications

## 2012-12-18 NOTE — Patient Instructions (Signed)
Your physician wants you to follow-up in: 6 months with Dr. Klein. You will receive a reminder letter in the mail two months in advance. If you don't receive a letter, please call our office to schedule the follow-up appointment.  Your physician recommends that you continue on your current medications as directed. Please refer to the Current Medication list given to you today.  

## 2012-12-18 NOTE — Assessment & Plan Note (Signed)
Patient has atrial fibrillation which is paroxysmal. She is currently tolerating however is. She is not sure she we have afforded. We discussed warfarin as an alternative.

## 2012-12-18 NOTE — Progress Notes (Signed)
Patient Care Team: Enrique Sack, MD as PCP - General (Internal Medicine)   HPI  Samantha Foley is a 79 y.o. female Seen in followup for atrial fibrillation. This has been of long-standing duration with symptoms dating back 5 years. She also has a history of palpitations in the back to her 79s. These episodes are associated with awareness but not with lightheadedness shortness of breath or weakness.  She has mild shortness of breath with exertion but no a edema no exertional or resting chest discomfort.  She denies orthostatic intolerance  She apparently is her her shoulder badly. Is a noninflammatory issue. Surgery was recommended for her being rehabilitation for 6 months and currently she is nursing her husband  Past Medical History  Diagnosis Date  . Seasonal allergies   . Vision problem     wears contacts  . Arthritis   . Lactose intolerance   . GERD (gastroesophageal reflux disease)   . Food allergy     Scallops--no other seafood allergies  . Chronic back pain     Dr. Shelle Iron  . Chronic knee pain     Dr. Jerl Santos  . Colon polyp     Dr. Laural Benes  . PVC's (premature ventricular contractions)   . IBS (irritable bowel syndrome)   . Impaired glucose tolerance   . Osteopenia   . Hyperlipidemia   . Migraine     resolved after menopause  . Hiatal hernia   . Atrial fibrillation     reported but not yet documented    Past Surgical History  Procedure Laterality Date  . Tonsillectomy and adenoidectomy  age 52  . Wisdom tooth extraction    . Vertebroplasty  05/2009  . Dilation and curettage of uterus  1960's  . Sebaceous cyst excision  2009    neck    Current Outpatient Prescriptions  Medication Sig Dispense Refill  . apixaban (ELIQUIS) 5 MG TABS tablet Take 1 tablet (5 mg total) by mouth 2 (two) times daily.  30 tablet  0  . cholecalciferol (VITAMIN D) 1000 UNITS tablet Take 1,000 Units by mouth daily.      Marland Kitchen Fexofenadine HCl (ALLEGRA PO) Take 1 tablet by mouth daily.       . fluticasone (FLONASE) 50 MCG/ACT nasal spray Place 2 sprays into the nose daily.       No current facility-administered medications for this visit.    Allergies  Allergen Reactions  . Ciprofloxacin Shortness Of Breath and Rash  . Scallops (Shellfish Allergy) Anaphylaxis  . Demerol Other (See Comments)    Patient states it made her crazy and sleep for 12 hours  . Milk-Related Compounds Diarrhea  . Penicillins Other (See Comments)    unknown  . Sudafed (Pseudoephedrine Hcl) Other (See Comments)    hyperactive    Review of Systems negative except from HPI and PMH  Physical Exam BP 158/80  Pulse 75  Ht 5\' 1"  (1.549 m)  Wt 177 lb (80.287 kg)  BMI 33.46 kg/m2 Well developed and well nourished in no acute distress HENT normal E scleral and icterus clear Neck Supple JVP flat; carotids brisk and full Clear to ausculation   Regular rate and rhythm, no murmurs gallops or rub Soft with active bowel sounds No clubbing cyanosis none Edema Alert and oriented, grossly normal motor and sensory function Skin Warm and Dry  ECG was not repeated today  Assessment and  Plan

## 2012-12-19 ENCOUNTER — Other Ambulatory Visit: Payer: Self-pay | Admitting: *Deleted

## 2012-12-19 MED ORDER — APIXABAN 5 MG PO TABS: 5.0000 mg | ORAL_TABLET | Freq: Two times a day (BID) | ORAL | Status: DC

## 2012-12-29 ENCOUNTER — Telehealth: Payer: Self-pay | Admitting: Internal Medicine

## 2012-12-29 MED ORDER — APIXABAN 5 MG PO TABS: 5.0000 mg | ORAL_TABLET | Freq: Two times a day (BID) | ORAL | Status: DC

## 2012-12-29 NOTE — Telephone Encounter (Signed)
New problem  On July 7, the tier 3 for Eliquis was approved and it is good for 1 year.

## 2012-12-29 NOTE — Telephone Encounter (Signed)
Spoke with pt, aware meds are approved.

## 2013-01-01 ENCOUNTER — Other Ambulatory Visit: Payer: Self-pay | Admitting: Gastroenterology

## 2013-01-01 DIAGNOSIS — R1310 Dysphagia, unspecified: Secondary | ICD-10-CM

## 2013-01-06 ENCOUNTER — Other Ambulatory Visit: Payer: Self-pay | Admitting: Family Medicine

## 2013-01-07 ENCOUNTER — Ambulatory Visit
Admission: RE | Admit: 2013-01-07 | Discharge: 2013-01-07 | Disposition: A | Discharge status: Home / Self Care | Payer: Medicare Other | Source: Ambulatory Visit | Attending: Gastroenterology | Admitting: Gastroenterology

## 2013-01-07 DIAGNOSIS — R1310 Dysphagia, unspecified: Secondary | ICD-10-CM

## 2013-01-14 ENCOUNTER — Other Ambulatory Visit: Payer: Self-pay | Admitting: Gastroenterology

## 2013-01-19 LAB — HM COLONOSCOPY

## 2013-03-11 ENCOUNTER — Telehealth: Payer: Self-pay | Admitting: Internal Medicine

## 2013-03-11 NOTE — Telephone Encounter (Signed)
Patient is taking Eliquis and wants to know what she can take for her headache

## 2013-03-11 NOTE — Telephone Encounter (Signed)
Pt told she could take tylenol, she states it is not helping, told her to call her pcp to direct her further. She agreed to plan.

## 2013-04-07 ENCOUNTER — Telehealth: Payer: Self-pay | Admitting: Internal Medicine

## 2013-04-07 NOTE — Telephone Encounter (Signed)
New problem      Pt is taking Eliquis  and would like to take an over the counter cold med. COLD-EEZE.     Pt needs to speak to some one about it.  If no answer pt would like you to leave a message.

## 2013-04-07 NOTE — Telephone Encounter (Signed)
Pt called because she and her husband have a head cold pt  bough a medication " cold-EEZE" over the counter; in the direction said to call the cardiologist if pt is taking coumadin. Pt is taking Eliquis and her husband is taking Coumadin. [employer] pharmacist states that is not an issue with eather medications with COLD-EEZE . It fine for pt to take this medication. Pt aware; she verbalized understanding.

## 2013-04-16 ENCOUNTER — Encounter: Payer: Self-pay | Admitting: Internal Medicine

## 2013-04-16 ENCOUNTER — Ambulatory Visit (INDEPENDENT_AMBULATORY_CARE_PROVIDER_SITE_OTHER): Payer: Medicare Other | Admitting: Internal Medicine

## 2013-04-16 VITALS — BP 145/77 | HR 61 | Ht 62.0 in | Wt 174.4 lb

## 2013-04-16 DIAGNOSIS — I4891 Unspecified atrial fibrillation: Secondary | ICD-10-CM

## 2013-04-16 DIAGNOSIS — R42 Dizziness and giddiness: Secondary | ICD-10-CM

## 2013-04-16 NOTE — Assessment & Plan Note (Signed)
This may be related to the apixaban. We have discussed various anticoagulation options. She would like to go on warfarin. We will transition her to the care of her Coumadin clinic

## 2013-04-16 NOTE — Progress Notes (Signed)
      Patient Care Team: Enrique Sack, MD as PCP - General (Internal Medicine)   HPI  Samantha Foley is a 79 y.o. female Seen in followup for paroxysmal  atrial fibrillation of long-standing   She's been having problems with dizziness which she describes to the initiation of her apixaban.  She's been having more frequent episodes of atrial fibrillation which later quite exhausted. There is no associated presyncope. It typically lasts only an hour or so less   Past Medical History  Diagnosis Date  . Seasonal allergies   . Vision problem     wears contacts  . Arthritis   . Lactose intolerance   . GERD (gastroesophageal reflux disease)   . Food allergy     Scallops--no other seafood allergies  . Chronic back pain     Dr. Shelle Iron  . Chronic knee pain     Dr. Jerl Santos  . Colon polyp     Dr. Laural Benes  . PVC's (premature ventricular contractions)   . IBS (irritable bowel syndrome)   . Impaired glucose tolerance   . Osteopenia   . Hyperlipidemia   . Migraine     resolved after menopause  . Hiatal hernia   . Atrial fibrillation     reported but not yet documented    Past Surgical History  Procedure Laterality Date  . Tonsillectomy and adenoidectomy  age 6  . Wisdom tooth extraction    . Vertebroplasty  05/2009  . Dilation and curettage of uterus  1960's  . Sebaceous cyst excision  2009    neck    Current Outpatient Prescriptions  Medication Sig Dispense Refill  . apixaban (ELIQUIS) 5 MG TABS tablet Take 1 tablet (5 mg total) by mouth 2 (two) times daily.  60 tablet  6  . Fexofenadine HCl (ALLEGRA PO) Take 1 tablet by mouth daily.      . fluticasone (FLONASE) 50 MCG/ACT nasal spray USE TWO SPRAYS IN EACH NOSTRIL DAILY  16 g  0  . omeprazole (PRILOSEC) 20 MG capsule Take 20 mg by mouth 2 (two) times daily.      . traMADol-acetaminophen (ULTRACET) 37.5-325 MG per tablet Take 1 tablet by mouth as needed. Pt is taking 2 a day       No current facility-administered  medications for this visit.    Allergies  Allergen Reactions  . Ciprofloxacin Shortness Of Breath and Rash  . Scallops [Shellfish Allergy] Anaphylaxis  . Demerol Other (See Comments)    Patient states it made her crazy and sleep for 12 hours  . Milk-Related Compounds Diarrhea  . Penicillins Other (See Comments)    unknown  . Sudafed [Pseudoephedrine Hcl] Other (See Comments)    hyperactive    Review of Systems negative except from HPI and PMH  Physical Exam BP 145/77  Pulse 61  Ht 5\' 2"  (1.575 m)  Wt 174 lb 6.4 oz (79.107 kg)  BMI 31.89 kg/m2 Well developed and well nourished in no acute distress HENT normal E scleral and icterus clear Neck Supple JVP flat; carotids brisk and full Clear to ausculation  Regular rate and rhythm, no murmurs gallops or rub Soft with active bowel sounds No clubbing cyanosis none Edema Alert and oriented, grossly normal motor and sensory function Skin Warm and Dry     Assessment and  Plan

## 2013-04-16 NOTE — Assessment & Plan Note (Signed)
She is having increasing paroxysms of atrial fibrillation workup quite debilitating  We will begin her on flecainide but we will wait until we have changed her anticoagulation as to avoid any confusion that need arise from dizziness as a side effect of flecainide as well as a possible consequence of her apixaban

## 2013-04-16 NOTE — Patient Instructions (Signed)
You have been referred to Weston Brass, our pharmacist to discuss changing from Eliquis  Keep current follow up appointment  in December

## 2013-04-27 ENCOUNTER — Ambulatory Visit (INDEPENDENT_AMBULATORY_CARE_PROVIDER_SITE_OTHER): Payer: Medicare Other | Admitting: *Deleted

## 2013-04-27 DIAGNOSIS — I4891 Unspecified atrial fibrillation: Secondary | ICD-10-CM

## 2013-04-27 MED ORDER — WARFARIN SODIUM 5 MG PO TABS: 5.0000 mg | ORAL_TABLET | ORAL | Status: DC

## 2013-05-05 ENCOUNTER — Ambulatory Visit (INDEPENDENT_AMBULATORY_CARE_PROVIDER_SITE_OTHER): Payer: Medicare Other | Admitting: General Practice

## 2013-05-05 DIAGNOSIS — I4891 Unspecified atrial fibrillation: Secondary | ICD-10-CM

## 2013-05-05 LAB — PROTIME-INR: INR: 12.6 ratio (ref 0.8–1.0)

## 2013-05-05 MED ORDER — PHYTONADIONE 5 MG PO TABS: 2.5000 mg | ORAL_TABLET | Freq: Once | ORAL | Status: DC

## 2013-05-08 ENCOUNTER — Ambulatory Visit (INDEPENDENT_AMBULATORY_CARE_PROVIDER_SITE_OTHER): Payer: Medicare Other | Admitting: *Deleted

## 2013-05-08 DIAGNOSIS — I4891 Unspecified atrial fibrillation: Secondary | ICD-10-CM

## 2013-05-13 ENCOUNTER — Ambulatory Visit (INDEPENDENT_AMBULATORY_CARE_PROVIDER_SITE_OTHER): Payer: Medicare Other | Admitting: Pharmacist

## 2013-05-13 DIAGNOSIS — I4891 Unspecified atrial fibrillation: Secondary | ICD-10-CM

## 2013-05-13 MED ORDER — WARFARIN SODIUM 1 MG PO TABS: 1.0000 mg | ORAL_TABLET | Freq: Every day | ORAL | Status: DC

## 2013-05-19 ENCOUNTER — Ambulatory Visit (INDEPENDENT_AMBULATORY_CARE_PROVIDER_SITE_OTHER): Payer: Medicare Other | Admitting: *Deleted

## 2013-05-19 DIAGNOSIS — I4891 Unspecified atrial fibrillation: Secondary | ICD-10-CM

## 2013-05-19 LAB — POCT INR: INR: 3.7

## 2013-05-28 ENCOUNTER — Ambulatory Visit (INDEPENDENT_AMBULATORY_CARE_PROVIDER_SITE_OTHER): Payer: Medicare Other | Admitting: Pharmacist

## 2013-05-28 DIAGNOSIS — I4891 Unspecified atrial fibrillation: Secondary | ICD-10-CM

## 2013-05-28 LAB — POCT INR: INR: 2.2

## 2013-06-05 ENCOUNTER — Encounter: Payer: Self-pay | Admitting: Internal Medicine

## 2013-06-05 ENCOUNTER — Ambulatory Visit (INDEPENDENT_AMBULATORY_CARE_PROVIDER_SITE_OTHER): Payer: Medicare Other | Admitting: Internal Medicine

## 2013-06-05 ENCOUNTER — Ambulatory Visit (INDEPENDENT_AMBULATORY_CARE_PROVIDER_SITE_OTHER): Payer: Medicare Other | Admitting: General Practice

## 2013-06-05 VITALS — BP 137/83 | HR 63 | Ht 62.0 in | Wt 174.4 lb

## 2013-06-05 DIAGNOSIS — I4891 Unspecified atrial fibrillation: Secondary | ICD-10-CM

## 2013-06-05 DIAGNOSIS — R42 Dizziness and giddiness: Secondary | ICD-10-CM

## 2013-06-05 LAB — POCT INR: INR: 1.5

## 2013-06-05 NOTE — Assessment & Plan Note (Signed)
Paroxysm Continue current meds

## 2013-06-05 NOTE — Patient Instructions (Signed)
Your physician recommends that you continue on your current medications as directed. Please refer to the Current Medication list given to you today.  Your physician wants you to follow-up in: 1 year with Dr. Klein.  You will receive a reminder letter in the mail two months in advance. If you don't receive a letter, please call our office to schedule the follow-up appointment.  

## 2013-06-05 NOTE — Progress Notes (Signed)
      Patient Care Team: Enrique Sack, MD as PCP - General (Internal Medicine)   HPI  Samantha Foley is a 79 y.o. female Seen in followup for paroxysmal atrial fibrillation of long-standing  She's been having problems with dizziness which she describes to the initiation of her apixaban and we went back to warfain, dizziness resolved  .  She's been having more frequent episodes of atrial fibrillation which later quite exhausted. There is no associated presyncope. It typically lasts only an hour or so less      Past Medical History  Diagnosis Date  . Seasonal allergies   . Vision problem     wears contacts  . Arthritis   . Lactose intolerance   . GERD (gastroesophageal reflux disease)   . Food allergy     Scallops--no other seafood allergies  . Chronic back pain     Dr. Shelle Iron  . Chronic knee pain     Dr. Jerl Santos  . Colon polyp     Dr. Laural Benes  . PVC's (premature ventricular contractions)   . IBS (irritable bowel syndrome)   . Impaired glucose tolerance   . Osteopenia   . Hyperlipidemia   . Migraine     resolved after menopause  . Hiatal hernia   . Atrial fibrillation     reported but not yet documented    Past Surgical History  Procedure Laterality Date  . Tonsillectomy and adenoidectomy  age 89  . Wisdom tooth extraction    . Vertebroplasty  05/2009  . Dilation and curettage of uterus  1960's  . Sebaceous cyst excision  2009    neck    Current Outpatient Prescriptions  Medication Sig Dispense Refill  . Fexofenadine HCl (ALLEGRA PO) Take 1 tablet by mouth daily.      . fluticasone (FLONASE) 50 MCG/ACT nasal spray USE TWO SPRAYS IN EACH NOSTRIL DAILY  16 g  0  . omeprazole (PRILOSEC) 20 MG capsule Take 20 mg by mouth 2 (two) times daily.      . traMADol-acetaminophen (ULTRACET) 37.5-325 MG per tablet Take 1 tablet by mouth as needed. Pt is taking 2 a day      . warfarin (COUMADIN) 1 MG tablet Take 1 tablet (1 mg total) by mouth daily.  30 tablet  1    No current facility-administered medications for this visit.    Allergies  Allergen Reactions  . Ciprofloxacin Shortness Of Breath and Rash  . Scallops [Shellfish Allergy] Anaphylaxis  . Demerol Other (See Comments)    Patient states it made her crazy and sleep for 12 hours  . Milk-Related Compounds Diarrhea  . Penicillins Other (See Comments)    unknown  . Sudafed [Pseudoephedrine Hcl] Other (See Comments)    hyperactive    Review of Systems negative except from HPI and PMH  Physical Exam BP 137/83  Pulse 63  Ht 5\' 2"  (1.575 m)  Wt 174 lb 6.4 oz (79.107 kg)  BMI 31.89 kg/m2 Well developed and well nourished in no acute distress HENT normal E scleral and icterus clear Neck Supple JVP flat; carotids brisk and full Clear to ausculation  Regular rate and rhythm, no murmurs gallops or rub Soft with active bowel sounds No clubbing cyanosis no  Edema Alert and oriented, grossly normal motor and sensory function Skin Warm and Dry  NSR @63   .[id number]  Assessment and  Plan

## 2013-06-15 ENCOUNTER — Telehealth: Payer: Self-pay | Admitting: *Deleted

## 2013-06-15 MED ORDER — WARFARIN SODIUM 1 MG PO TABS: 1.0000 mg | ORAL_TABLET | Freq: Every day | ORAL | Status: DC

## 2013-06-15 NOTE — Telephone Encounter (Signed)
refill warfarin

## 2013-06-23 ENCOUNTER — Ambulatory Visit (INDEPENDENT_AMBULATORY_CARE_PROVIDER_SITE_OTHER): Payer: Medicare Other

## 2013-06-23 DIAGNOSIS — I4891 Unspecified atrial fibrillation: Secondary | ICD-10-CM

## 2013-06-23 LAB — POCT INR: INR: 1.9

## 2013-06-26 ENCOUNTER — Ambulatory Visit: Payer: Medicare Other | Admitting: Internal Medicine

## 2013-07-07 ENCOUNTER — Ambulatory Visit (INDEPENDENT_AMBULATORY_CARE_PROVIDER_SITE_OTHER): Payer: Medicare Other | Admitting: *Deleted

## 2013-07-07 DIAGNOSIS — I4891 Unspecified atrial fibrillation: Secondary | ICD-10-CM

## 2013-07-07 LAB — POCT INR: INR: 1.6

## 2013-07-07 MED ORDER — WARFARIN SODIUM 1 MG PO TABS: ORAL_TABLET | ORAL | Status: DC

## 2013-07-21 ENCOUNTER — Ambulatory Visit: Payer: Medicare Other | Attending: Internal Medicine

## 2013-07-21 DIAGNOSIS — IMO0001 Reserved for inherently not codable concepts without codable children: Secondary | ICD-10-CM | POA: Insufficient documentation

## 2013-07-21 DIAGNOSIS — R262 Difficulty in walking, not elsewhere classified: Secondary | ICD-10-CM | POA: Insufficient documentation

## 2013-07-21 DIAGNOSIS — R269 Unspecified abnormalities of gait and mobility: Secondary | ICD-10-CM | POA: Insufficient documentation

## 2013-07-21 DIAGNOSIS — M6281 Muscle weakness (generalized): Secondary | ICD-10-CM | POA: Insufficient documentation

## 2013-07-22 ENCOUNTER — Ambulatory Visit (INDEPENDENT_AMBULATORY_CARE_PROVIDER_SITE_OTHER): Payer: Medicare Other | Admitting: *Deleted

## 2013-07-22 DIAGNOSIS — I4891 Unspecified atrial fibrillation: Secondary | ICD-10-CM

## 2013-07-22 LAB — POCT INR: INR: 1.8

## 2013-08-04 ENCOUNTER — Ambulatory Visit: Payer: Medicare Other

## 2013-08-04 ENCOUNTER — Other Ambulatory Visit: Payer: Self-pay

## 2013-08-04 MED ORDER — WARFARIN SODIUM 1 MG PO TABS: ORAL_TABLET | ORAL | Status: DC

## 2013-08-10 ENCOUNTER — Encounter: Payer: Medicare Other | Admitting: Physical Therapy

## 2013-09-09 ENCOUNTER — Ambulatory Visit (INDEPENDENT_AMBULATORY_CARE_PROVIDER_SITE_OTHER): Payer: Medicare Other | Admitting: *Deleted

## 2013-09-09 DIAGNOSIS — I4891 Unspecified atrial fibrillation: Secondary | ICD-10-CM

## 2013-09-09 LAB — POCT INR: INR: 2.8

## 2013-09-09 MED ORDER — WARFARIN SODIUM 1 MG PO TABS: ORAL_TABLET | ORAL | Status: DC

## 2013-10-07 ENCOUNTER — Ambulatory Visit (INDEPENDENT_AMBULATORY_CARE_PROVIDER_SITE_OTHER): Payer: Medicare Other | Admitting: Pharmacist

## 2013-10-07 DIAGNOSIS — I4891 Unspecified atrial fibrillation: Secondary | ICD-10-CM

## 2013-10-07 LAB — POCT INR: INR: 2.6

## 2013-10-08 ENCOUNTER — Other Ambulatory Visit (HOSPITAL_COMMUNITY)
Admission: RE | Admit: 2013-10-08 | Discharge: 2013-10-08 | Disposition: A | Discharge status: Home / Self Care | Payer: Medicare Other | Source: Ambulatory Visit | Attending: Internal Medicine | Admitting: Internal Medicine

## 2013-10-08 DIAGNOSIS — Z01419 Encounter for gynecological examination (general) (routine) without abnormal findings: Secondary | ICD-10-CM | POA: Insufficient documentation

## 2013-10-29 ENCOUNTER — Ambulatory Visit (INDEPENDENT_AMBULATORY_CARE_PROVIDER_SITE_OTHER): Payer: Medicare Other | Admitting: Pharmacist Clinician (PhC)/ Clinical Pharmacy Specialist

## 2013-10-29 DIAGNOSIS — I4891 Unspecified atrial fibrillation: Secondary | ICD-10-CM

## 2013-10-29 LAB — POCT INR: INR: 3.1

## 2013-12-03 ENCOUNTER — Ambulatory Visit (INDEPENDENT_AMBULATORY_CARE_PROVIDER_SITE_OTHER): Payer: Medicare Other | Admitting: *Deleted

## 2013-12-03 DIAGNOSIS — I4891 Unspecified atrial fibrillation: Secondary | ICD-10-CM

## 2013-12-03 LAB — POCT INR: INR: 2.7

## 2013-12-16 ENCOUNTER — Encounter (HOSPITAL_COMMUNITY): Payer: Self-pay | Admitting: Emergency Medicine

## 2013-12-16 ENCOUNTER — Emergency Department (HOSPITAL_COMMUNITY): Payer: Medicare Other

## 2013-12-16 ENCOUNTER — Emergency Department (HOSPITAL_COMMUNITY)
Admission: EM | Admit: 2013-12-16 | Discharge: 2013-12-16 | Disposition: A | Discharge status: Home / Self Care | Payer: Medicare Other | Attending: Emergency Medicine | Admitting: Emergency Medicine

## 2013-12-16 ENCOUNTER — Other Ambulatory Visit: Payer: Self-pay | Admitting: Physician Assistant

## 2013-12-16 DIAGNOSIS — Z8739 Personal history of other diseases of the musculoskeletal system and connective tissue: Secondary | ICD-10-CM | POA: Insufficient documentation

## 2013-12-16 DIAGNOSIS — R079 Chest pain, unspecified: Secondary | ICD-10-CM | POA: Diagnosis present

## 2013-12-16 DIAGNOSIS — Z7901 Long term (current) use of anticoagulants: Secondary | ICD-10-CM | POA: Insufficient documentation

## 2013-12-16 DIAGNOSIS — G8929 Other chronic pain: Secondary | ICD-10-CM | POA: Insufficient documentation

## 2013-12-16 DIAGNOSIS — R0789 Other chest pain: Secondary | ICD-10-CM

## 2013-12-16 DIAGNOSIS — Z860100 Personal history of colon polyps, unspecified: Secondary | ICD-10-CM | POA: Insufficient documentation

## 2013-12-16 DIAGNOSIS — K219 Gastro-esophageal reflux disease without esophagitis: Secondary | ICD-10-CM | POA: Insufficient documentation

## 2013-12-16 DIAGNOSIS — E785 Hyperlipidemia, unspecified: Secondary | ICD-10-CM | POA: Insufficient documentation

## 2013-12-16 DIAGNOSIS — I4891 Unspecified atrial fibrillation: Secondary | ICD-10-CM | POA: Insufficient documentation

## 2013-12-16 DIAGNOSIS — IMO0002 Reserved for concepts with insufficient information to code with codable children: Secondary | ICD-10-CM | POA: Insufficient documentation

## 2013-12-16 DIAGNOSIS — R071 Chest pain on breathing: Secondary | ICD-10-CM | POA: Insufficient documentation

## 2013-12-16 DIAGNOSIS — Z79899 Other long term (current) drug therapy: Secondary | ICD-10-CM | POA: Insufficient documentation

## 2013-12-16 DIAGNOSIS — Z880 Allergy status to penicillin: Secondary | ICD-10-CM | POA: Insufficient documentation

## 2013-12-16 LAB — BASIC METABOLIC PANEL
BUN: 13 mg/dL (ref 6–23)
CO2: 28 meq/L (ref 19–32)
Calcium: 9.9 mg/dL (ref 8.4–10.5)
Chloride: 102 mEq/L (ref 96–112)
Creatinine, Ser: 0.59 mg/dL (ref 0.50–1.10)
GFR calc Af Amer: >90 mL/min (ref >90)
GFR calc non Af Amer: 84 mL/min — ABNORMAL LOW (ref >90)
GLUCOSE: 95 mg/dL (ref 70–99)
Potassium: 4.5 mEq/L (ref 3.7–5.3)
SODIUM: 142 meq/L (ref 137–147)

## 2013-12-16 LAB — CBC
HEMATOCRIT: 40.7 % (ref 36.0–46.0)
HEMOGLOBIN: 13.4 g/dL (ref 12.0–15.0)
MCH: 29.3 pg (ref 26.0–34.0)
MCHC: 32.9 g/dL (ref 30.0–36.0)
MCV: 89.1 fL (ref 78.0–100.0)
Platelets: 160 10*3/uL (ref 150–400)
RBC: 4.57 MIL/uL (ref 3.87–5.11)
RDW: 14.3 % (ref 11.5–15.5)
WBC: 6.7 10*3/uL (ref 4.0–10.5)

## 2013-12-16 LAB — TROPONIN I

## 2013-12-16 LAB — I-STAT TROPONIN, ED: Troponin i, poc: 0 ng/mL (ref 0.00–0.08)

## 2013-12-16 LAB — PROTIME-INR
INR: 2.12 — AB (ref 0.00–1.49)
Prothrombin Time: 23.7 seconds — ABNORMAL HIGH (ref 11.6–15.2)

## 2013-12-16 LAB — PRO B NATRIURETIC PEPTIDE: PRO B NATRI PEPTIDE: 126.6 pg/mL (ref 0–450)

## 2013-12-16 MED ORDER — GI COCKTAIL ~~LOC~~
30.0000 mL | Freq: Once | ORAL | Status: AC
Start: 2013-12-16 — End: 2013-12-16
  Administered 2013-12-16: 30 mL via ORAL
  Filled 2013-12-16: qty 30

## 2013-12-16 MED ORDER — FAMOTIDINE 20 MG PO TABS
40.0000 mg | ORAL_TABLET | Freq: Once | ORAL | Status: AC
  Administered 2013-12-16: 40 mg via ORAL
  Filled 2013-12-16: qty 2

## 2013-12-16 NOTE — ED Provider Notes (Signed)
CSN: [id number]     Arrival date & time 12/16/13  1120 History   First MD Initiated Contact with Patient 12/16/13 1213     Chief Complaint  Patient presents with  . Chest Pain     HPI Pt was seen at 1215.  Per pt, c/o gradual onset and persistence of multiple intermittent episodes of left sided chest "pain" that began at 10am. Pt states she "hasn't slept well in 2 nights" and was "feeling very tired" this morning. Pt states she ate instant oatmeal approximate 0900 and laid down in bed approximately 1000. Pt states that is when she felt left sided chest "pressure" that lasted less than 1 minute. Pt states the discomfort completely resolved, then returned again several minutes later. Discomfort lasted less than 1 minute before completely resolving again. Pt states she came to the ED "because it just kept coming and going like that." EMS gave ASA and SL ntg while en route without change in symptoms. Denies hx of similar symptoms. Denies palpitations, no SOB/cough, no back pain, no abd pain, no N/V/D, no diaphoresis.     Cards: Dr. Caryl Comes Past Medical History  Diagnosis Date  . Seasonal allergies   . Vision problem     wears contacts  . Arthritis   . Lactose intolerance   . GERD (gastroesophageal reflux disease)   . Food allergy     Scallops--no other seafood allergies  . Chronic back pain     Dr. Tonita Cong  . Chronic knee pain     Dr. Rhona Raider  . Colon polyp     Dr. Wynetta Emery  . PVC's (premature ventricular contractions)   . Impaired glucose tolerance   . Osteopenia   . Hyperlipidemia   . Hiatal hernia   . Atrial fibrillation     reported but not yet documented   Past Surgical History  Procedure Laterality Date  . Tonsillectomy and adenoidectomy  age 41  . Wisdom tooth extraction    . Vertebroplasty  05/2009  . Sebaceous cyst excision  2009    neck   Family History  Problem Relation Age of Onset  . Rheumatic fever Mother   . Diabetes Mother   . Heart disease Mother      rheumatic heart disease  . Cancer Sister 72    breast cancer  . Cancer Brother     bladder and prostate cancer  . Migraines Daughter   . Cancer Sister     leukemia at 6; colon cancer in her late 30's--family ?'s dx   History  Substance Use Topics  . Smoking status: Never Smoker   . Smokeless tobacco: Never Used  . Alcohol Use: No     Comment: maybe one glass per year.    Review of Systems ROS: Statement: All systems negative except as marked or noted in the HPI; Constitutional: Negative for fever and chills. ; ; Eyes: Negative for eye pain, redness and discharge. ; ; ENMT: Negative for ear pain, hoarseness, nasal congestion, sinus pressure and sore throat. ; ; Cardiovascular: +CP. Negative for palpitations, diaphoresis, dyspnea and peripheral edema. ; ; Respiratory: Negative for cough, wheezing and stridor. ; ; Gastrointestinal: Negative for nausea, vomiting, diarrhea, abdominal pain, blood in stool, hematemesis, jaundice and rectal bleeding. . ; ; Genitourinary: Negative for dysuria, flank pain and hematuria. ; ; Musculoskeletal: Negative for back pain and neck pain. Negative for swelling and trauma.; ; Skin: Negative for pruritus, rash, abrasions, blisters, bruising and skin lesion.; ; Neuro: Negative for  headache, lightheadedness and neck stiffness. Negative for weakness, altered level of consciousness , altered mental status, extremity weakness, paresthesias, involuntary movement, seizure and syncope.      Allergies  Ciprofloxacin; Scallops; Demerol; Milk-related compounds; Penicillins; and Sudafed  Home Medications   Prior to Admission medications   Medication Sig Start Date End Date Taking? Authorizing Provider  BIOTIN 5000 PO Take 5,000 Units by mouth daily with breakfast.   Yes Historical Provider, MD  cholecalciferol (VITAMIN D) 1000 UNITS tablet Take 1,000 Units by mouth daily with breakfast.   Yes Historical Provider, MD  fluticasone (FLONASE) 50 MCG/ACT nasal spray Place 1  spray into both nostrils 2 (two) times daily.    Yes Historical Provider, MD  omeprazole (PRILOSEC) 20 MG capsule Take 20 mg by mouth 2 (two) times daily.   Yes Historical Provider, MD  traMADol-acetaminophen (ULTRACET) 37.5-325 MG per tablet Take 1 tablet by mouth 2 (two) times daily as needed (pain). Pt is taking 2 a day 03/12/13  Yes Historical Provider, MD  warfarin (COUMADIN) 1 MG tablet Take 1-1.5 mg by mouth daily at 6 PM. 1.5 mg on Monday, Wednesday, and Friday. 1 mg on Sunday, Tuesday, Thursday, and Saturday   Yes Historical Provider, MD   BP 144/71  Pulse 63  Temp(Src) 97.9 F (36.6 C) (Oral)  Resp 14  Ht 5\' 1"  (1.549 m)  Wt 166 lb (75.297 kg)  BMI 31.38 kg/m2  SpO2 97% Physical Exam 1220; Physical examination:  Nursing notes reviewed; Vital signs and O2 SAT reviewed;  Constitutional: Well developed, Well nourished, Well hydrated, In no acute distress; Head:  Normocephalic, atraumatic; Eyes: EOMI, PERRL, No scleral icterus; ENMT: Mouth and pharynx normal, Mucous membranes moist; Neck: Supple, Full range of motion, No lymphadenopathy; Cardiovascular: Regular rate and rhythm, No gallop; Respiratory: Breath sounds clear & equal bilaterally, No rales, rhonchi, wheezes.  Speaking full sentences with ease, Normal respiratory effort/excursion; Chest: Nontender, Movement normal; Abdomen: Soft, Nontender, Nondistended, Normal bowel sounds; Genitourinary: No CVA tenderness; Extremities: Pulses normal, No tenderness, No edema, No calf edema or asymmetry.; Neuro: AA&Ox3, Major CN grossly intact.  Speech clear. No gross focal motor or sensory deficits in extremities.; Skin: Color normal, Warm, Dry.   ED Course  Procedures     EKG Interpretation   Date/Time:  Wednesday December 16 2013 11:22:29 EDT Ventricular Rate:  63 PR Interval:  164 QRS Duration: 76 QT Interval:  396 QTC Calculation: 405 R Axis:   33 Text Interpretation:  Sinus rhythm with marked sinus arrhythmia Otherwise  normal ECG  When compared with ECG of 09/23/2012 No significant change was  found Confirmed by [facility]  MD, Nunzio Cory [phone]) on 12/16/2013 12:20:54 PM      MDM  MDM Reviewed: previous chart, nursing note and vitals Reviewed previous: labs and ECG Interpretation: labs, ECG and x-ray    Results for orders placed during the hospital encounter of 12/16/13  CBC      Result Value Ref Range   WBC 6.7  4.0 - 10.5 K/uL   RBC 4.57  3.87 - 5.11 MIL/uL   Hemoglobin 13.4  12.0 - 15.0 g/dL   HCT 40.7  36.0 - 46.0 %   MCV 89.1  78.0 - 100.0 fL   MCH 29.3  26.0 - 34.0 pg   MCHC 32.9  30.0 - 36.0 g/dL   RDW 14.3  11.5 - 15.5 %   Platelets 160  150 - 400 K/uL  BASIC METABOLIC PANEL      Result Value  Ref Range   Sodium 142  137 - 147 mEq/L   Potassium 4.5  3.7 - 5.3 mEq/L   Chloride 102  96 - 112 mEq/L   CO2 28  19 - 32 mEq/L   Glucose, Bld 95  70 - 99 mg/dL   BUN 13  6 - 23 mg/dL   Creatinine, Ser 0.59  0.50 - 1.10 mg/dL   Calcium 9.9  8.4 - 10.5 mg/dL   GFR calc non Af Amer 84 (*) >90 mL/min   GFR calc Af Amer >90  >90 mL/min  PRO B NATRIURETIC PEPTIDE      Result Value Ref Range   Pro B Natriuretic peptide (BNP) 126.6  0 - 450 pg/mL  PROTIME-INR      Result Value Ref Range   Prothrombin Time 23.7 (*) 11.6 - 15.2 seconds   INR 2.12 (*) 0.00 - 1.49  TROPONIN I      Result Value Ref Range   Troponin I <0.30  <0.30 ng/mL  I-STAT TROPOININ, ED      Result Value Ref Range   Troponin i, poc 0.00  0.00 - 0.08 ng/mL   Comment 3            Dg Chest Port 1 View 12/16/2013   CLINICAL DATA:  Chest pain, history GERD, hiatal hernia, atrial fibrillation  EXAM: PORTABLE CHEST - 1 VIEW  COMPARISON:  Portable exam 1200 hr compared to 09/23/2012  FINDINGS: Upper normal heart size.  Normal mediastinal contours and pulmonary vascularity.  Lungs clear.  No pleural effusion or pneumothorax.  Bones unremarkable.  IMPRESSION: No acute abnormalities.   Electronically Signed   By: Lavonia Dana M.D.   On: 12/16/2013 12:18     1330:  CP resolved after GI cocktail and PO pepcid. Doubt PE with low risk Well's.  CP atypical and doubt ACS at this time, though concerned regarding pt's age and several risk factors.  T/C to Cardiology, case discussed, including:  HPI, pertinent PM/SHx, VS/PE, dx testing, ED course and treatment:  Agreeable to come to ED for evaluation.   1500:  Cards Dr. Angelena Form has evaluated pt:  Agrees CP is atypical does not appear to be ACS at this time, requests to obtain 2nd troponin, if negative, pt can be d/c home and f/u tomorrow for stress test.  1600: 2nd troponin negative.  Will d/c home to f/u tomorrow for stress test. Dx and testing d/w pt.  Questions answered.  Verb understanding, agreeable to d/c home with outpt f/u tomorrow.       Alfonzo Feller, DO 12/19/13 2226

## 2013-12-16 NOTE — Discharge Instructions (Signed)
°Emergency Department Resource Guide °1) Find a Doctor and Pay Out of Pocket °Although you won't have to find out who is covered by your insurance plan, it is a good idea to ask around and get recommendations. You will then need to call the office and see if the doctor you have chosen will accept you as a new patient and what types of options they offer for patients who are self-pay. Some doctors offer discounts or will set up payment plans for their patients who do not have insurance, but you will need to ask so you aren't surprised when you get to your appointment. ° °2) Contact Your Local Health Department °Not all health departments have doctors that can see patients for sick visits, but many do, so it is worth a call to see if yours does. If you don't know where your local health department is, you can check in your phone book. The CDC also has a tool to help you locate your state's health department, and many state websites also have listings of all of their local health departments. ° °3) Find a Walk-in Clinic °If your illness is not likely to be very severe or complicated, you may want to try a walk in clinic. These are popping up all over the country in pharmacies, drugstores, and shopping centers. They're usually staffed by nurse practitioners or physician assistants that have been trained to treat common illnesses and complaints. They're usually fairly quick and inexpensive. However, if you have serious medical issues or chronic medical problems, these are probably not your best option. ° °No Primary Care Doctor: °- Call [facility] Connect at  [phone] - they can help you locate a primary care doctor that  accepts your insurance, provides certain services, etc. °- Physician Referral Service- 1-800-533-3463 ° °Chronic Pain Problems: °Organization         Address  Phone   Notes  °[facility]  ([phone]) [phone] Patients need to be referred by their primary care doctor.  ° °Medication  Assistance: °Organization         Address  Phone   Notes  °[place] [facility] [address] °[place], [place] [address] (336) 641-8030 --Must be a resident of [place] °-- Must have NO insurance coverage whatsoever (no Medicaid/ Medicare, etc.) °-- The pt. MUST have a primary care doctor that directs their care regularly and follows them in the community °  °[facility]  (866) 331-1348   °[employer]  (888) 892-1162   ° °Agencies that provide inexpensive medical care: °Organization         Address  Phone   Notes  °[facility]  (336) 832-8035   °[facility]    (336) 832-7272   °[facility] [address] °[place], [place] [address] (336) 832-4777   °[facility] [address], °[place] (336) 271-4999   °[facility]    (336) 373-0678   °[facility]    (336) 272-1050   °[facility] ° [address], [place] Phone:  (336) 832-4444, Fax:  (336) 832-4440 Hours of Operation:  9 am - 6 pm, M-F.  Also accepts Medicaid/Medicare and self-pay.  °[facility] ° [address], [place] Phone: (336) [phone], Fax: (336) [phone]. Hours of Operation:  8:30 am - 5:30 pm, M-F.  Also accepts Medicaid and self-pay.  °[facility] [place] [address], [place] Phone: (336) 878-6027   °[facility] [address], [place], [place] (336)723-1848, Ext. 123 Mondays & Thursdays: 7-9 AM.  First 15 patients are seen on a first come, first serve basis. °  ° °Medicaid-accepting [place] Providers: ° °Organization         Address  Phone   Notes  °[facility] [address], [place] (336) 641-2100 Also accepts self-pay patients.  °[facility] [address], [place] ° (336) 856-9996   °[facility] [address], [place]  (336) 288-8857   °[facility] [address] [place] [address], [place] (336) 299-7000   °Veita Bland [address], [place]  ° (336) 373-1557 Only accepts [place] Access Medicaid patients after they have their name applied to their card.  ° °Self-Pay (no insurance) in [place]: ° °Organization         Address  Phone   Notes  °Sickle Cell Patients, [facility] [address], [place] (336) 832-1970   °[facility] [address], [place] (336) 832-4400   ° [facility] ° [address], [place] (336) 992-4800   °[facility]/Dr. Osei-Bonsu ° [address] [place] [address], [place] or [address], [place] (336) [phone] Phone number for both [place] and [place] locations is the same.  °[facility] [address], [place] (336) 299-0000   °[facility] [address] [place] [address], [place] or [address] (336) 852-7530 °(336) 878-2260   °[facility] [address], [place] (336) 350-1642, phone; (336) 294-5005, fax Sees patients 1st and 3rd Saturday of every month.  Must not qualify for public or private insurance (i.e. Medicaid, Medicare, [place] Health Choice, Veterans' Benefits) • Household income should be no more than 200% of the poverty level •The clinic cannot treat you if you are pregnant or think you are pregnant • Sexually transmitted diseases are not treated at the clinic.  ° ° °Dental Care: °Organization         Address  Phone  Notes  °[place] [facility] [address], [place] (336) 641-6152 Accepts children up to age 21 who are enrolled in Medicaid or [place] Health Choice; pregnant women with a Medicaid card; and children who have applied for Medicaid or [place] Health Choice, but were declined, whose parents can pay a reduced fee at time of service.  °[place]  [facility] [place]  [address], [place] (336) 641-7733 Accepts children up to age 21 who are enrolled in Medicaid or [place] Health Choice; pregnant women with a Medicaid card; and children who have applied for Medicaid or [place] Health Choice, but were declined, whose parents can pay a reduced fee at time of service.  °[facility] ° [address], [place] (336) 641-4533 Patients are seen by appointment only. Walk-ins are not accepted. [facility] will see patients 18 years of age and older. °Monday - Tuesday (8am-5pm) °Most Wednesdays (8:30-5pm) °$30 per visit, cash only  °[facility] ° [address], [place] (336) 641-4533 Patients are seen by appointment only. Walk-ins are not accepted. [facility] will see patients 18 years of age and older. °One   Wednesday Evening (Monthly: Volunteer Based).  $30 per visit, cash only  °[facility]  (919) 537-3737 for adults; Children under age 4, call Graduate Pediatric Dentistry at ([phone]) [phone]. Children aged 4-14, please call ([phone]) [phone] to request a pediatric application. ° Dental services are provided in all areas of dental care including fillings, crowns and bridges, complete and partial dentures, implants, gum treatment, root canals, and extractions. Preventive care is also provided. Treatment is provided to both adults and children. °Patients are selected via a lottery and there is often a waiting list. °  °[facility] [address], °[place] ° (336) 763-8833 [email or web address] °  °[facility] [address], [place], [place] (336)723-1848, Ext. 123 Second and Fourth Thursday of each month, opens at 6:30 AM; Clinic ends at 9 AM.  Patients are seen on a first-come first-served basis, and a limited number are seen during each clinic.  ° °[facility] ° [address], [place], [place] (336) [phone]    Eligibility Requirements °You must have lived in [place], [place], or [place] counties for at least the last three months. °  You cannot be eligible for state or federal sponsored healthcare insurance, including Veterans Administration, Medicaid, or Medicare. °  You generally cannot be eligible for healthcare insurance through your employer.  °  How to apply: °Eligibility screenings are held every Tuesday and Wednesday afternoon from 1:00 pm until 4:00 pm. You do not need an appointment for the interview!  °[facility] [address], [place], [place] 336-631-2330   °[facility]  336-342-8273   °[facility]  336-703-3100   °[facility]  336-570-6415   ° °Behavioral Health Resources in the Community: °Intensive Outpatient Programs °Organization         Address  Phone  Notes  °[facility] [address], [place], [place] 336-878-6098   °[facility] [address], [place], [place] 336-832-9800   °[facility] [address], [place], [place] ° 336-882-2125   °[facility] [address],  °[place], [place] 1-800-853-5163 or 336-641-4981   °Substance Abuse Resources °Organization         Address  Phone  Notes  °[facility]  336-882-2125   °[facility]  336-784-9470   °[facility]  336-285-9073   °[facility]  336-845-3988   °Residential & Outpatient Substance Abuse Program  1-800-659-3381   °Psychological Services °Organization         Address  Phone  Notes  °[facility]  336- 832-9600   °[facility]  336- 378-7881   °[facility] [address], [place] 1-800-853-5163 or 336-641-4981   ° °Mobile Crisis Teams °Organization         Address  Phone  Notes  °[facility], Mobile Crisis Care Unit  1-877-626-1772   °Assertive °[facility] ° [address]  [place], [place] 336-834-9664   °Sharon DeEsch [address] °[place] 336-554-5454   ° °Self-Help/Support Groups °Organization         Address  Phone             Notes  °[facility]  - variety of support groups  336- 373-1402 Call for more information  °Narcotics Anonymous (NA), [facility] [address], °[place]  2 meetings at this location  ° °  Residential Treatment Programs Organization         Address  Phone  Notes  [facility] [address]    [place]  1-(281)036-0364   [facility]  [address] [place] [address], [place], [place]   [facility] [employer], [place] 818-282-9620 Admissions: 8am-3pm M-F  [facility] [address]    [place], [place] [phone]   [facility] [address] [place], [place], [place]   [facility] [address],  [place], [place]   [facility] Intensive Outpatient [address], [place], [place]   [facility] ([address] [place] [address]  [place], [place] 1-920-273-2659 or [phone]   [facility] ([facility] [address], [place], [place] Accepts Medicaid  [facility] [address],  [place] 1-813 122 9985 Substance Abuse/Addiction Treatment   [facility] Organization         Address  Phone  Notes  [facility]  907-727-5676   Domenic Schwab, PhD [address] [place], [place]   615 534 6284 or (480)707-8962   [facility] [place], [place] 807-530-0479   [facility] [address] [place], [place] 870-233-9947 Insurance/Medicaid/sponsorship through [facility] and Families [address] [place]                                    [place], [place] [phone] [place] [address], [place] ([phone]    Dr. Adele Schilder  [phone]   [facility] Dept. 1) [address], [place] 2) [place] 3)  [address], [place] [phone] [phone]  [phone]   [place] (281)[phone] or (845) [phone] (After Hours)       Take your usual prescriptions as previously directed. Take over the counter tylenol, as directed on packaging, as needed for discomfort.  Apply moist heat or ice to the area(s) of discomfort, for 15 minutes at a time, several times per day for the next few days.  Do not fall asleep on a heating or ice pack. Follow the instructions given to you by your Cardiologist today. He has scheduled a follow up appointment tomorrow for a cardiac stress test. Return to the Emergency Department immediately sooner if worsening.

## 2013-12-16 NOTE — ED Notes (Signed)
Pt arrived via [facility], pt reports having CP this morning, pain is specific to the left chest, pt describes pain as pressure. Pt has been having trouble sleeping for the past 2 nights. 12 lead showed NSR with EMS. Pt was given 324 ASA and 1 nitroglycerin tablet in route. Vital signs stable. NAD on arrival.

## 2013-12-16 NOTE — Consult Note (Signed)
Reason for Consult: chest pain Referring Physician:   LINZI OHLINGER is an 81 y.o. female.  HPI:   The patient is an 81 yo female with a history of PAF on coumadin, HLD, chronic back and knee pain, PVCs.  She reports not sleeping for the last 2 nights after discontinuing tramadol, which she was taking twice daily for pain. She has been able to sleep about 4 hours during the day the last couple days. Today she was laying down and developed 5/10 chest pain. She describes it as pressure. It would come and go. She also had some pain in her left arm which wasn't always present. She reports never having symptoms like these before.  The patient currently denies nausea, vomiting, fever, shortness of breath, orthopnea, dizziness, PND, cough, congestion, abdominal pain, hematochezia, melena, lower extremity edema.  During the interview the patient yawned and was able to reproduce the pain.    Past Medical History  Diagnosis Date  . Seasonal allergies   . Vision problem     wears contacts  . Arthritis   . Lactose intolerance   . GERD (gastroesophageal reflux disease)   . Food allergy     Scallops--no other seafood allergies  . Chronic back pain     Dr. Tonita Cong  . Chronic knee pain     Dr. Rhona Raider  . Colon polyp     Dr. Wynetta Emery  . PVC's (premature ventricular contractions)   . Impaired glucose tolerance   . Osteopenia   . Hyperlipidemia   . Hiatal hernia   . Atrial fibrillation     reported but not yet documented    Past Surgical History  Procedure Laterality Date  . Tonsillectomy and adenoidectomy  age 57  . Wisdom tooth extraction    . Vertebroplasty  05/2009  . Sebaceous cyst excision  2009    neck    Family History  Problem Relation Age of Onset  . Rheumatic fever Mother   . Diabetes Mother   . Heart disease Mother     rheumatic heart disease  . Cancer Sister 37    breast cancer  . Cancer Brother     bladder and prostate cancer  . Migraines Daughter   . Cancer  Sister     leukemia at 53; colon cancer in her late 30's--family ?'s dx    Social History:  reports that she has never smoked. She has never used smokeless tobacco. She reports that she does not drink alcohol or use illicit drugs.  Allergies:  Allergies  Allergen Reactions  . Ciprofloxacin Shortness Of Breath and Rash  . Scallops [Shellfish Allergy] Anaphylaxis  . Demerol Other (See Comments)    Patient states it made her crazy and sleep for 12 hours  . Milk-Related Compounds Diarrhea  . Penicillins Other (See Comments)    unknown  . Sudafed [Pseudoephedrine Hcl] Other (See Comments)    hyperactive    Medications:  Prior to Admission medications   Medication Sig Start Date End Date Taking? Authorizing Provider  BIOTIN 5000 PO Take 5,000 Units by mouth daily with breakfast.   Yes Historical Provider, MD  cholecalciferol (VITAMIN D) 1000 UNITS tablet Take 1,000 Units by mouth daily with breakfast.   Yes Historical Provider, MD  fluticasone (FLONASE) 50 MCG/ACT nasal spray Place 1 spray into both nostrils 2 (two) times daily.    Yes Historical Provider, MD  omeprazole (PRILOSEC) 20 MG capsule Take 20 mg by mouth 2 (two) times  daily.   Yes Historical Provider, MD  traMADol-acetaminophen (ULTRACET) 37.5-325 MG per tablet Take 1 tablet by mouth 2 (two) times daily as needed (pain). Pt is taking 2 a day 03/12/13  Yes Historical Provider, MD  warfarin (COUMADIN) 1 MG tablet Take 1-1.5 mg by mouth daily at 6 PM. 1.5 mg on Monday, Wednesday, and Friday. 1 mg on Sunday, Tuesday, Thursday, and Saturday   Yes Historical Provider, MD     Results for orders placed during the hospital encounter of 12/16/13 (from the past 48 hour(s))  CBC     Status: None   Collection Time    12/16/13 12:10 PM      Result Value Ref Range   WBC 6.7  4.0 - 10.5 K/uL   RBC 4.57  3.87 - 5.11 MIL/uL   Hemoglobin 13.4  12.0 - 15.0 g/dL   HCT 40.7  36.0 - 46.0 %   MCV 89.1  78.0 - 100.0 fL   MCH 29.3  26.0 - 34.0  pg   MCHC 32.9  30.0 - 36.0 g/dL   RDW 14.3  11.5 - 15.5 %   Platelets 160  150 - 400 K/uL  BASIC METABOLIC PANEL     Status: Abnormal   Collection Time    12/16/13 12:10 PM      Result Value Ref Range   Sodium 142  137 - 147 mEq/L   Potassium 4.5  3.7 - 5.3 mEq/L   Chloride 102  96 - 112 mEq/L   CO2 28  19 - 32 mEq/L   Glucose, Bld 95  70 - 99 mg/dL   BUN 13  6 - 23 mg/dL   Creatinine, Ser 0.59  0.50 - 1.10 mg/dL   Calcium 9.9  8.4 - 10.5 mg/dL   GFR calc non Af Amer 84 (*) >90 mL/min   GFR calc Af Amer >90  >90 mL/min   Comment: (NOTE)     The eGFR has been calculated using the CKD EPI equation.     This calculation has not been validated in all clinical situations.     eGFR's persistently <90 mL/min signify possible Chronic Kidney     Disease.  PRO B NATRIURETIC PEPTIDE     Status: None   Collection Time    12/16/13 12:10 PM      Result Value Ref Range   Pro B Natriuretic peptide (BNP) 126.6  0 - 450 pg/mL  PROTIME-INR     Status: Abnormal   Collection Time    12/16/13 12:10 PM      Result Value Ref Range   Prothrombin Time 23.7 (*) 11.6 - 15.2 seconds   INR 2.12 (*) 0.00 - 1.49  I-STAT TROPOININ, ED     Status: None   Collection Time    12/16/13 12:22 PM      Result Value Ref Range   Troponin i, poc 0.00  0.00 - 0.08 ng/mL   Comment 3            Comment: Due to the release kinetics of cTnI,     a negative result within the first hours     of the onset of symptoms does not rule out     myocardial infarction with certainty.     If myocardial infarction is still suspected,     repeat the test at appropriate intervals.    Dg Chest Port 1 View  12/16/2013   CLINICAL DATA:  Chest pain, history GERD, hiatal hernia, atrial  fibrillation  EXAM: PORTABLE CHEST - 1 VIEW  COMPARISON:  Portable exam 1200 hr compared to 09/23/2012  FINDINGS: Upper normal heart size.  Normal mediastinal contours and pulmonary vascularity.  Lungs clear.  No pleural effusion or pneumothorax.  Bones  unremarkable.  IMPRESSION: No acute abnormalities.   Electronically Signed   By: Lavonia Dana M.D.   On: 12/16/2013 12:18    Review of Systems  Constitutional: Negative for fever and diaphoresis.  HENT: Negative for congestion and sore throat.   Respiratory: Negative for cough and shortness of breath.   Cardiovascular: Positive for chest pain (resolved 3 hours ago). Negative for orthopnea, leg swelling and PND.  Gastrointestinal: Negative for nausea, vomiting, abdominal pain, blood in stool and melena.  Genitourinary: Negative for hematuria.  Musculoskeletal: Positive for myalgias (left arm pain).  Neurological: Negative for dizziness.   Blood pressure 144/71, pulse 63, temperature 97.9 F (36.6 C), temperature source Oral, resp. rate 14, height '5\' 1"'  (1.549 m), weight 166 lb (75.297 kg), SpO2 97.00%. Physical Exam  Nursing note and vitals reviewed. Constitutional: She is oriented to person, place, and time. She appears well-developed and well-nourished. No distress.  HENT:  Head: Normocephalic and atraumatic.  Mouth/Throat: No oropharyngeal exudate.  Eyes: EOM are normal. No scleral icterus.  Neck: Normal range of motion. Neck supple.  Cardiovascular: Normal rate, regular rhythm, S1 normal and S2 normal.   No murmur heard. Pulses:      Radial pulses are 2+ on the right side, and 2+ on the left side.       Dorsalis pedis pulses are 2+ on the right side, and 2+ on the left side.  No carotid bruit  Respiratory: Effort normal and breath sounds normal. She has no wheezes. She has no rales.  GI: Soft. Bowel sounds are normal. She exhibits no distension. There is no tenderness.  Musculoskeletal: She exhibits no edema.  Lymphadenopathy:    She has no cervical adenopathy.  Neurological: She is alert and oriented to person, place, and time. She exhibits normal muscle tone.  Skin: Skin is warm and dry.  Psychiatric: She has a normal mood and affect.    Assessment/Plan: Principal  Problem:   Chest pain   paroxysmal atrial fibrillation  Plan: Patient's chest pain has some atypical features in that it is reproducible when she yawns.  She's had no acute EKG changes and initial troponin is negative.  The pain resolved 3 hours ago after arriving at the ER. We will recheck another troponin and if negative discharge home with outpatient Lexiscan.  Currently maintaining normal sinus rhythm.  Therapeutic INR on Coumadin.  Tarri Fuller, [facility] 12/16/2013, 2:49 PM   I have personally seen and examined this patient with Tarri Fuller, PA-C. I agree with the assessment and plan as outlined above. Her chest pain is atypical. Her troponin is negative. No EKG changes to suggest ischemia. Chest pain is reproducible with yawning. I do not think her pain is cardiac. If next troponin is negative, can d/c home from ED. We will arrange a stress myoview in our office and f/u with Dr. Caryl Comes in several weeks.   MCALHANY,CHRISTOPHER 12/16/2013 3:09 PM

## 2013-12-16 NOTE — ED Notes (Signed)
Cardiology at bedside.

## 2013-12-17 ENCOUNTER — Encounter (HOSPITAL_COMMUNITY): Payer: Medicare Other

## 2013-12-22 ENCOUNTER — Encounter (HOSPITAL_COMMUNITY): Payer: Medicare Other

## 2013-12-30 ENCOUNTER — Ambulatory Visit (HOSPITAL_COMMUNITY): Payer: Medicare Other | Attending: Cardiovascular Disease | Admitting: Radiology

## 2013-12-30 ENCOUNTER — Ambulatory Visit (INDEPENDENT_AMBULATORY_CARE_PROVIDER_SITE_OTHER): Payer: Medicare Other | Admitting: *Deleted

## 2013-12-30 VITALS — BP 149/83 | HR 68 | Ht 62.0 in | Wt 166.0 lb

## 2013-12-30 DIAGNOSIS — I4891 Unspecified atrial fibrillation: Secondary | ICD-10-CM | POA: Insufficient documentation

## 2013-12-30 DIAGNOSIS — R079 Chest pain, unspecified: Secondary | ICD-10-CM | POA: Insufficient documentation

## 2013-12-30 DIAGNOSIS — R0789 Other chest pain: Secondary | ICD-10-CM

## 2013-12-30 LAB — POCT INR: INR: 2.1

## 2013-12-30 MED ORDER — TECHNETIUM TC 99M SESTAMIBI GENERIC - CARDIOLITE
10.0000 | Freq: Once | INTRAVENOUS | Status: AC | PRN
  Administered 2013-12-30: 10 via INTRAVENOUS

## 2013-12-30 MED ORDER — WARFARIN SODIUM 1 MG PO TABS: 1.0000 mg | ORAL_TABLET | ORAL | Status: DC

## 2013-12-30 MED ORDER — REGADENOSON 0.4 MG/5ML IV SOLN
0.4000 mg | Freq: Once | INTRAVENOUS | Status: AC
  Administered 2013-12-30: 0.4 mg via INTRAVENOUS

## 2013-12-30 MED ORDER — TECHNETIUM TC 99M SESTAMIBI GENERIC - CARDIOLITE
30.0000 | Freq: Once | INTRAVENOUS | Status: AC | PRN
  Administered 2013-12-30: 30 via INTRAVENOUS

## 2013-12-30 NOTE — Progress Notes (Signed)
[name], [name] [phone]    Cardiology Nuclear Med Study  Samantha Foley is a 80 y.o. female     MRN : [id number]     DOB: [birth year]  Procedure Date: 12/30/2013  Nuclear Med Background Indication for Stress Test:  Evaluation for Ischemia, [facility] and 12/16/13 CP (-) enzymes History:  AFIB-H/O-PVCs 5/14 ECHO: EF: 65-70% Cardiac Risk Factors: Lipids  Symptoms:  Chest Pain   Nuclear Pre-Procedure Caffeine/Decaff Intake:  None NPO After: 7:30pm   Lungs:  clear O2 Sat: 95% on room air. IV 0.9% NS with Angio Cath:  22g  IV Site: R Antecubital  IV Started by:  Perrin Maltese, EMT-P  Chest Size (in):   Cup Size: D  Height: 5\' 2"  (1.575 m)  Weight:  166 lb (75.297 kg)  BMI:  Body mass index is 30.35 kg/(m^2). Tech Comments:  Rx this am    Nuclear Med Study 1 or 2 day study: 1 day  Stress Test Type:  Carlton Adam  Reading MD: n/a  Order Authorizing Provider:  S.Klein  Resting Radionuclide: Technetium 7m Sestamibi  Resting Radionuclide Dose: 11.0 mCi   Stress Radionuclide:  Technetium 47m Tetrofosmin  Stress Radionuclide Dose: 33.0 mCi           Stress Protocol Rest HR: 68 Stress HR: 100  Rest BP: 149/83 Stress BP: 149/72  Exercise Time (min): n/a METS: n/a           Dose of Adenosine (mg):  n/a Dose of Lexiscan: 0.4 mg  Dose of Atropine (mg): n/a Dose of Dobutamine: n/a mcg/kg/min (at max HR)  Stress Test Technologist: Glade Lloyd, BS-ES  Nuclear Technologist:  Annye Rusk, CNMT     Rest Procedure:  Myocardial perfusion imaging was performed at rest 45 minutes following the intravenous administration of Technetium 107m Sestamibi. Rest ECG: NSR - Normal EKG  Stress Procedure:  The patient received IV Lexiscan 0.4 mg over 15-seconds.  Technetium 63m Sestamibi injected at 30-seconds.  Quantitative spect images were obtained after a 45 minute delay.  During the infusion of Lexiscan the patient  complained of SOB.  This resolved in recovery.  Stress ECG: No significant change from baseline ECG  QPS Raw Data Images:  Normal; no motion artifact; normal heart/lung ratio. Stress Images:  Apical anterior defect Rest Images:  Apical anterior defect Subtraction (SDS):  There is a fixed anteriour defect that is most consistent with breast attenuation. Transient Ischemic Dilatation (Normal <1.22):  0.82 Lung/Heart Ratio (Normal <0.45):  0.20  Quantitative Gated Spect Images QGS EDV:  55 ml QGS ESV:  9 ml  Impression Exercise Capacity:  Lexiscan with no exercise. BP Response:  Normal blood pressure response. Clinical Symptoms:  No significant symptoms noted. ECG Impression:  No significant ECG changes with Lexiscan. Comparison with Prior Nuclear Study: No previous nuclear study performed  Overall Impression:  Low risk stress nuclear study with small area of anteroapical breast attenuation artifact. No ischemia.  LV Ejection Fraction: 83%.  LV Wall Motion:  Normal Wall Motion  Pixie Casino, MD, [facility] Board Certified in Nuclear Cardiology Attending Cardiologist [place]

## 2014-01-13 ENCOUNTER — Telehealth: Payer: Self-pay | Admitting: Internal Medicine

## 2014-01-13 ENCOUNTER — Ambulatory Visit (INDEPENDENT_AMBULATORY_CARE_PROVIDER_SITE_OTHER): Payer: Medicare Other | Admitting: *Deleted

## 2014-01-13 DIAGNOSIS — I4891 Unspecified atrial fibrillation: Secondary | ICD-10-CM

## 2014-01-13 LAB — POCT INR: INR: 2.3

## 2014-01-13 NOTE — Telephone Encounter (Signed)
spoke to pt and reviewed results.

## 2014-01-13 NOTE — Telephone Encounter (Signed)
This encounter was created in error - please disregard.

## 2014-01-13 NOTE — Telephone Encounter (Signed)
Pt came in for cvrr and wanted to know her stress test results, pt thinks they were given to husband. But he doesn't remember well. So please when you call back wwith results  Ask for wife.

## 2014-01-13 NOTE — Addendum Note (Signed)
Addended by: Stanton Kidney on: 01/13/2014 06:01 PM   Modules accepted: Level of Service, SmartSet

## 2014-02-15 ENCOUNTER — Ambulatory Visit (INDEPENDENT_AMBULATORY_CARE_PROVIDER_SITE_OTHER): Payer: Medicare Other | Admitting: *Deleted

## 2014-02-15 DIAGNOSIS — I4891 Unspecified atrial fibrillation: Secondary | ICD-10-CM

## 2014-02-15 LAB — POCT INR: INR: 2.7

## 2014-02-24 ENCOUNTER — Telehealth: Payer: Self-pay | Admitting: Internal Medicine

## 2014-02-24 NOTE — Telephone Encounter (Signed)
°  Pt would like a order for her and her husband to be able to have their coumadin to be drawn at home. Please call and advise.

## 2014-02-24 NOTE — Telephone Encounter (Signed)
Spoke with Mrs Ganaway and instructed that unless he had [facility] and they could obtain INR and call us with the results  that  is the only way monitored in the home . Also if could get physician to monitor could do home monitoring (self testing)  but the coumadin clinic is unable to monitor self testing  Pt is to be seen by MD next week asked his wife to talk with MD and see if maybe he could reorder home health and pt's wife states she will do so

## 2014-03-15 ENCOUNTER — Ambulatory Visit (INDEPENDENT_AMBULATORY_CARE_PROVIDER_SITE_OTHER): Payer: Medicare Other

## 2014-03-15 DIAGNOSIS — I4891 Unspecified atrial fibrillation: Secondary | ICD-10-CM

## 2014-03-15 LAB — POCT INR: INR: 2.7

## 2014-03-29 ENCOUNTER — Other Ambulatory Visit: Payer: Self-pay | Admitting: Internal Medicine

## 2014-03-29 DIAGNOSIS — M8580 Other specified disorders of bone density and structure, unspecified site: Secondary | ICD-10-CM

## 2014-04-05 ENCOUNTER — Other Ambulatory Visit: Payer: Medicare Other

## 2014-04-06 ENCOUNTER — Other Ambulatory Visit: Payer: Medicare Other

## 2014-04-27 ENCOUNTER — Telehealth: Payer: Self-pay | Admitting: Internal Medicine

## 2014-04-27 NOTE — Telephone Encounter (Signed)
New message    Patient calling C/O pain below shoulder blade on left side. Upper lip twitching.

## 2014-04-27 NOTE — Telephone Encounter (Signed)
Patient tells me that about 1/2 hour after shoulder blade pain began, she burped several times and now pain is gone! She is going to contact her PCP about upper lip twitching (she and her dtr are both experiencing this)

## 2014-05-04 ENCOUNTER — Ambulatory Visit (INDEPENDENT_AMBULATORY_CARE_PROVIDER_SITE_OTHER): Payer: Medicare Other | Admitting: *Deleted

## 2014-05-04 DIAGNOSIS — I4891 Unspecified atrial fibrillation: Secondary | ICD-10-CM

## 2014-05-04 LAB — POCT INR: INR: 2.4

## 2014-05-07 ENCOUNTER — Other Ambulatory Visit: Payer: Self-pay | Admitting: *Deleted

## 2014-05-07 MED ORDER — WARFARIN SODIUM 1 MG PO TABS: 1.0000 mg | ORAL_TABLET | ORAL | Status: DC

## 2014-06-15 ENCOUNTER — Ambulatory Visit (INDEPENDENT_AMBULATORY_CARE_PROVIDER_SITE_OTHER): Payer: Medicare Other | Admitting: Pharmacist

## 2014-06-15 DIAGNOSIS — I4891 Unspecified atrial fibrillation: Secondary | ICD-10-CM

## 2014-06-15 LAB — POCT INR: INR: 2.2

## 2014-07-11 DIAGNOSIS — H35369 Drusen (degenerative) of macula, unspecified eye: Secondary | ICD-10-CM | POA: Insufficient documentation

## 2014-07-29 ENCOUNTER — Ambulatory Visit (INDEPENDENT_AMBULATORY_CARE_PROVIDER_SITE_OTHER): Payer: PPO | Admitting: *Deleted

## 2014-07-29 DIAGNOSIS — I4891 Unspecified atrial fibrillation: Secondary | ICD-10-CM

## 2014-07-29 LAB — POCT INR: INR: 1.9

## 2014-09-20 ENCOUNTER — Other Ambulatory Visit: Payer: Self-pay | Admitting: Internal Medicine

## 2014-09-23 ENCOUNTER — Encounter: Payer: Self-pay | Admitting: Internal Medicine

## 2014-09-23 ENCOUNTER — Ambulatory Visit (INDEPENDENT_AMBULATORY_CARE_PROVIDER_SITE_OTHER): Payer: PPO | Admitting: *Deleted

## 2014-09-23 ENCOUNTER — Ambulatory Visit (INDEPENDENT_AMBULATORY_CARE_PROVIDER_SITE_OTHER): Payer: PPO | Admitting: Internal Medicine

## 2014-09-23 VITALS — BP 98/52 | HR 79 | Ht 62.0 in | Wt 166.8 lb

## 2014-09-23 DIAGNOSIS — E7800 Pure hypercholesterolemia, unspecified: Secondary | ICD-10-CM

## 2014-09-23 DIAGNOSIS — I480 Paroxysmal atrial fibrillation: Secondary | ICD-10-CM

## 2014-09-23 DIAGNOSIS — I4891 Unspecified atrial fibrillation: Secondary | ICD-10-CM

## 2014-09-23 LAB — POCT INR: INR: 2.2

## 2014-09-23 MED ORDER — WARFARIN SODIUM 1 MG PO TABS: ORAL_TABLET | ORAL | Status: DC

## 2014-09-23 NOTE — Patient Instructions (Signed)
Your physician recommends that you continue on your current medications as directed. Please refer to the Current Medication list given to you today.  Your physician wants you to follow-up in: 1 year with Dr. Klein.  You will receive a reminder letter in the mail two months in advance. If you don't receive a letter, please call our office to schedule the follow-up appointment.  

## 2014-09-23 NOTE — Progress Notes (Signed)
      Patient Care Team: Levin Erp, MD as PCP - General (Internal Medicine)   HPI  Samantha Foley is a 81 y.o. female Seen in followup for paroxysmal atrial fibrillation of long-standing  She's been having problems with dizziness which she describes to the initiation of her apixaban and we went back to warfain, dizziness resolved  .  She's been having more frequent episodes of atrial fibrillation which later quite exhausted. There is no associated presyncope. It typically lasts only an hour or so less      Past Medical History  Diagnosis Date  . Seasonal allergies   . Vision problem     wears contacts  . Arthritis   . Lactose intolerance   . GERD (gastroesophageal reflux disease)   . Food allergy     Scallops--no other seafood allergies  . Chronic back pain     Dr. Tonita Cong  . Chronic knee pain     Dr. Rhona Raider  . Colon polyp     Dr. Wynetta Emery  . PVC's (premature ventricular contractions)   . Impaired glucose tolerance   . Osteopenia   . Hyperlipidemia   . Hiatal hernia   . Atrial fibrillation     reported but not yet documented    Past Surgical History  Procedure Laterality Date  . Tonsillectomy and adenoidectomy  age 33  . Wisdom tooth extraction    . Vertebroplasty  05/2009  . Sebaceous cyst excision  2009    neck    Current Outpatient Prescriptions  Medication Sig Dispense Refill  . omeprazole (PRILOSEC) 20 MG capsule Take 20 mg by mouth 2 (two) times daily.    Marland Kitchen warfarin (COUMADIN) 1 MG tablet Take as directed by Coumadin clinic 40 tablet 2   No current facility-administered medications for this visit.    Allergies  Allergen Reactions  . Ciprofloxacin Shortness Of Breath and Rash  . Scallops [Shellfish Allergy] Anaphylaxis  . Demerol Other (See Comments)    Patient states it made her crazy and sleep for 12 hours  . Milk-Related Compounds Diarrhea  . Penicillins Other (See Comments)    unknown  . Sudafed [Pseudoephedrine Hcl] Other (See  Comments)    hyperactive    Review of Systems negative except from HPI and PMH  Physical Exam BP 98/52 mmHg  Pulse 79  Ht 5\' 2"  (1.575 m)  Wt 166 lb 12.8 oz (75.66 kg)  BMI 30.50 kg/m2 Well developed and well nourished in no acute distress HENT normal E scleral and icterus clear Neck Supple JVP flat; carotids brisk and full Clear to ausculation  Regular rate and rhythm, no murmurs gallops or rub Soft with active bowel sounds No clubbing cyanosis no  Edema Alert and oriented, grossly normal motor and sensory function Skin Warm and Dry  NSR @63   .[id number]  Assessment and  Plan  Paroxysmal atrial fibrillation   Hypotension   Grief  Patient has had infrequent atrial fibrillation. She remains on warfarin having not tolerated NOACs.   Her husband to 75 years died 2 weeks ago. She and her daughter continue to struggle with the loss. She called her [name]  We spent more than 50% of our >25 min visit in face to face counseling regarding the above

## 2014-11-04 ENCOUNTER — Ambulatory Visit (INDEPENDENT_AMBULATORY_CARE_PROVIDER_SITE_OTHER): Payer: PPO

## 2014-11-04 DIAGNOSIS — I4891 Unspecified atrial fibrillation: Secondary | ICD-10-CM | POA: Diagnosis not present

## 2014-11-04 LAB — POCT INR: INR: 1.5

## 2014-11-18 ENCOUNTER — Ambulatory Visit (INDEPENDENT_AMBULATORY_CARE_PROVIDER_SITE_OTHER): Payer: PPO

## 2014-11-18 DIAGNOSIS — I4891 Unspecified atrial fibrillation: Secondary | ICD-10-CM | POA: Diagnosis not present

## 2014-11-18 LAB — POCT INR: INR: 1.7

## 2014-12-07 ENCOUNTER — Ambulatory Visit (INDEPENDENT_AMBULATORY_CARE_PROVIDER_SITE_OTHER): Payer: PPO

## 2014-12-07 DIAGNOSIS — I4891 Unspecified atrial fibrillation: Secondary | ICD-10-CM

## 2014-12-07 LAB — POCT INR: INR: 2

## 2014-12-29 ENCOUNTER — Ambulatory Visit (INDEPENDENT_AMBULATORY_CARE_PROVIDER_SITE_OTHER): Payer: PPO | Admitting: *Deleted

## 2014-12-29 DIAGNOSIS — I4891 Unspecified atrial fibrillation: Secondary | ICD-10-CM | POA: Diagnosis not present

## 2014-12-29 LAB — POCT INR: INR: 3

## 2014-12-31 ENCOUNTER — Other Ambulatory Visit: Payer: Self-pay | Admitting: Obstetrics & Gynecology

## 2014-12-31 DIAGNOSIS — R928 Other abnormal and inconclusive findings on diagnostic imaging of breast: Secondary | ICD-10-CM

## 2015-01-05 ENCOUNTER — Ambulatory Visit
Admission: RE | Admit: 2015-01-05 | Discharge: 2015-01-05 | Disposition: A | Discharge status: Home / Self Care | Payer: PPO | Source: Ambulatory Visit | Attending: Obstetrics & Gynecology | Admitting: Obstetrics & Gynecology

## 2015-01-05 ENCOUNTER — Other Ambulatory Visit: Payer: PPO

## 2015-01-05 DIAGNOSIS — R928 Other abnormal and inconclusive findings on diagnostic imaging of breast: Secondary | ICD-10-CM

## 2015-01-26 ENCOUNTER — Ambulatory Visit (INDEPENDENT_AMBULATORY_CARE_PROVIDER_SITE_OTHER): Payer: PPO | Admitting: *Deleted

## 2015-01-26 DIAGNOSIS — I4891 Unspecified atrial fibrillation: Secondary | ICD-10-CM

## 2015-01-26 LAB — POCT INR: INR: 2.4

## 2015-03-10 ENCOUNTER — Ambulatory Visit (INDEPENDENT_AMBULATORY_CARE_PROVIDER_SITE_OTHER): Payer: PPO | Admitting: Pharmacist

## 2015-03-10 DIAGNOSIS — I4891 Unspecified atrial fibrillation: Secondary | ICD-10-CM

## 2015-03-10 LAB — POCT INR: INR: 3

## 2015-04-20 ENCOUNTER — Other Ambulatory Visit: Payer: Self-pay | Admitting: Internal Medicine

## 2015-04-21 ENCOUNTER — Ambulatory Visit (INDEPENDENT_AMBULATORY_CARE_PROVIDER_SITE_OTHER): Payer: PPO | Admitting: *Deleted

## 2015-04-21 DIAGNOSIS — I4891 Unspecified atrial fibrillation: Secondary | ICD-10-CM

## 2015-04-21 LAB — POCT INR: INR: 2.7

## 2015-05-26 DIAGNOSIS — S32030A Wedge compression fracture of third lumbar vertebra, initial encounter for closed fracture: Secondary | ICD-10-CM

## 2015-05-26 HISTORY — DX: Wedge compression fracture of third lumbar vertebra, initial encounter for closed fracture: S32.030A

## 2015-06-02 ENCOUNTER — Ambulatory Visit (INDEPENDENT_AMBULATORY_CARE_PROVIDER_SITE_OTHER): Payer: PPO | Admitting: Pharmacist

## 2015-06-02 DIAGNOSIS — I4891 Unspecified atrial fibrillation: Secondary | ICD-10-CM

## 2015-06-02 LAB — POCT INR: INR: 2.2

## 2015-06-08 ENCOUNTER — Other Ambulatory Visit: Payer: Self-pay | Admitting: Obstetrics & Gynecology

## 2015-06-08 DIAGNOSIS — R229 Localized swelling, mass and lump, unspecified: Principal | ICD-10-CM

## 2015-06-08 DIAGNOSIS — IMO0002 Reserved for concepts with insufficient information to code with codable children: Secondary | ICD-10-CM

## 2015-06-21 ENCOUNTER — Other Ambulatory Visit: Payer: Self-pay | Admitting: Specialist

## 2015-06-21 DIAGNOSIS — IMO0002 Reserved for concepts with insufficient information to code with codable children: Secondary | ICD-10-CM

## 2015-06-22 ENCOUNTER — Telehealth: Payer: Self-pay | Admitting: Internal Medicine

## 2015-06-22 NOTE — Telephone Encounter (Signed)
New message  Request for surgical clearance:  What type of surgery is being performed?Vertebroplasty  1. When is this surgery scheduled? 06/28/2015 possibly   2. Are there any medications that need to be held prior to surgery and how long? Warfarin or coumadin for 4 days   3. Name of physician performing surgery? Dr. Jobe Igo Radiologist   4. What is your office phone and fax number? [id number] # [phone] Tel # [phone]  Comments: Faxed surg clearance on 12/27 Around 3 pm.  5.

## 2015-06-23 NOTE — Telephone Encounter (Signed)
i am not sure what the question is but there are no cardiac contraindication tp versed fentayl And apixoban can be stopped as needed typically  72 hrs

## 2015-06-23 NOTE — Telephone Encounter (Signed)
I called and spoke with Chrys Racer at [facility] to clarify what type of sedation the patient will require for her Vertebroplasty. Per Chrys Racer, this will be Fentanyl and Versed. I advised her I did not receive the initial fax, so she will resend this to me. I also advised her that Dr. Caryl Comes is not here until tomorrow, so we will review with him early and fax back to her. Per Chrys Racer, the patient is coming in a 2 pm for a consultation tomorrow.

## 2015-06-24 ENCOUNTER — Telehealth: Payer: Self-pay | Admitting: Pharmacist

## 2015-06-24 ENCOUNTER — Other Ambulatory Visit (HOSPITAL_COMMUNITY): Payer: Self-pay | Admitting: Radiology

## 2015-06-24 ENCOUNTER — Other Ambulatory Visit: Payer: Self-pay | Admitting: Specialist

## 2015-06-24 ENCOUNTER — Ambulatory Visit
Admission: RE | Admit: 2015-06-24 | Discharge: 2015-06-24 | Disposition: A | Discharge status: Home / Self Care | Payer: PPO | Source: Ambulatory Visit | Attending: Specialist | Admitting: Specialist

## 2015-06-24 DIAGNOSIS — IMO0002 Reserved for concepts with insufficient information to code with codable children: Secondary | ICD-10-CM

## 2015-06-24 DIAGNOSIS — S32000A Wedge compression fracture of unspecified lumbar vertebra, initial encounter for closed fracture: Secondary | ICD-10-CM

## 2015-06-24 LAB — CBC WITH DIFFERENTIAL/PLATELET
BASOS ABS: 0.1 10*3/uL (ref 0.0–0.1)
BASOS PCT: 1 % (ref 0–1)
EOS PCT: 1 % (ref 0–5)
Eosinophils Absolute: 0.1 10*3/uL (ref 0.0–0.7)
HCT: 38.1 % (ref 36.0–46.0)
Hemoglobin: 13.3 g/dL (ref 12.0–15.0)
LYMPHS PCT: 20 % (ref 12–46)
Lymphs Abs: 1.4 10*3/uL (ref 0.7–4.0)
MCH: 29.3 pg (ref 26.0–34.0)
MCHC: 34.9 g/dL (ref 30.0–36.0)
MCV: 83.9 fL (ref 78.0–100.0)
MONO ABS: 0.5 10*3/uL (ref 0.1–1.0)
MPV: 8.5 fL — AB (ref 8.6–12.4)
Monocytes Relative: 7 % (ref 3–12)
Neutro Abs: 5 10*3/uL (ref 1.7–7.7)
Neutrophils Relative %: 71 % (ref 43–77)
Platelets: 226 10*3/uL (ref 150–400)
RBC: 4.54 MIL/uL (ref 3.87–5.11)
RDW: 14.2 % (ref 11.5–15.5)
WBC: 7 10*3/uL (ref 4.0–10.5)

## 2015-06-24 NOTE — Telephone Encounter (Signed)
Clearance discussed with Venida Jarvis - will route to her since she is faxing paperwork back over to [facility].

## 2015-06-24 NOTE — Telephone Encounter (Signed)
Samantha Foley from [facility] following up on clearance. Please call back and discuss.

## 2015-06-24 NOTE — Telephone Encounter (Signed)
Pts daughter called to schedule appointment to have INR checked prior to procedure on Tuesday per doctor's request. Appointment scheduled for 0930.

## 2015-06-24 NOTE — Telephone Encounter (Signed)
Informed [facility] ok to stop Coumadin 4 days prior to Vertebroplasty. Advised will fax clearance for this within the next hour.

## 2015-06-24 NOTE — Telephone Encounter (Signed)
Pt dtr calling to follow up on clearance. Pt dtr stated that pt would need to be sched for coumadin appt for day of procedure- surgical clearance sent to office 12/30. Pt dtr requested to speak w/ coum clinic concerning clearance. Please call back and discuss.

## 2015-06-24 NOTE — Progress Notes (Signed)
Lavender tube drawn from left [facility] and sent to lab for CBC. Site is unremarkable and pt tolerated procedure well.

## 2015-06-28 ENCOUNTER — Ambulatory Visit (INDEPENDENT_AMBULATORY_CARE_PROVIDER_SITE_OTHER): Payer: PPO | Admitting: Surgery

## 2015-06-28 ENCOUNTER — Ambulatory Visit
Admission: RE | Admit: 2015-06-28 | Discharge: 2015-06-28 | Disposition: A | Discharge status: Home / Self Care | Payer: PPO | Source: Ambulatory Visit | Attending: Specialist | Admitting: Specialist

## 2015-06-28 DIAGNOSIS — I4891 Unspecified atrial fibrillation: Secondary | ICD-10-CM

## 2015-06-28 DIAGNOSIS — M67919 Unspecified disorder of synovium and tendon, unspecified shoulder: Secondary | ICD-10-CM | POA: Insufficient documentation

## 2015-06-28 DIAGNOSIS — H3530 Unspecified macular degeneration: Secondary | ICD-10-CM | POA: Insufficient documentation

## 2015-06-28 LAB — POCT INR: INR: 2.6

## 2015-06-28 NOTE — Discharge Instructions (Addendum)
Vertebroplasty Post Procedure Discharge Instructions  1. May resume a regular diet and any medications that you routinely take (including pain medications). 2. No driving day of procedure. 3. Upon discharge go home and rest for at least 4 hours.  May use an ice pack as needed to injection sites on back. 4. Remove bandades after shower in the morning. Replace with bandaides, change daily till healed. 5. Do not lift anything heavier than a mild jug. 6. Follow up with your primary care physician re: bone therapy. 7. Follow up with Dr. Jobe Igo in about 2 weeks.  8. RESUME WARFARIN TODAY.      Please contact our office at [phone] for the following symptoms:   Fever greater than 100 degrees  Increased swelling, pain, or redness at injection site.   Thank you for visiting [facility].

## 2015-06-30 ENCOUNTER — Ambulatory Visit (INDEPENDENT_AMBULATORY_CARE_PROVIDER_SITE_OTHER): Payer: PPO | Admitting: Pharmacist

## 2015-06-30 DIAGNOSIS — I4891 Unspecified atrial fibrillation: Secondary | ICD-10-CM | POA: Diagnosis not present

## 2015-06-30 LAB — POCT INR: INR: 1.6

## 2015-07-01 ENCOUNTER — Telehealth: Payer: Self-pay | Admitting: Radiology

## 2015-07-01 ENCOUNTER — Other Ambulatory Visit: Payer: Self-pay | Admitting: Specialist

## 2015-07-01 ENCOUNTER — Ambulatory Visit
Admission: RE | Admit: 2015-07-01 | Discharge: 2015-07-01 | Disposition: A | Discharge status: Home / Self Care | Payer: PPO | Source: Ambulatory Visit | Attending: Specialist | Admitting: Specialist

## 2015-07-01 ENCOUNTER — Ambulatory Visit (INDEPENDENT_AMBULATORY_CARE_PROVIDER_SITE_OTHER): Payer: PPO | Admitting: *Deleted

## 2015-07-01 VITALS — BP 140/77 | HR 75 | Temp 97.7°F | Resp 14

## 2015-07-01 DIAGNOSIS — I4891 Unspecified atrial fibrillation: Secondary | ICD-10-CM | POA: Diagnosis not present

## 2015-07-01 DIAGNOSIS — M545 Low back pain: Secondary | ICD-10-CM | POA: Diagnosis not present

## 2015-07-01 DIAGNOSIS — S32000A Wedge compression fracture of unspecified lumbar vertebra, initial encounter for closed fracture: Secondary | ICD-10-CM

## 2015-07-01 DIAGNOSIS — M8088XA Other osteoporosis with current pathological fracture, vertebra(e), initial encounter for fracture: Secondary | ICD-10-CM | POA: Diagnosis not present

## 2015-07-01 DIAGNOSIS — IMO0002 Reserved for concepts with insufficient information to code with codable children: Secondary | ICD-10-CM

## 2015-07-01 HISTORY — PX: VERTEBROPLASTY: SHX113

## 2015-07-01 LAB — POCT INR: INR: 1.4

## 2015-07-01 MED ORDER — MIDAZOLAM HCL 2 MG/2ML IJ SOLN
1.0000 mg | INTRAMUSCULAR | Status: DC | PRN
  Administered 2015-07-01 (×2): 0.5 mg via INTRAVENOUS

## 2015-07-01 MED ORDER — VANCOMYCIN HCL IN DEXTROSE 1-5 GM/200ML-% IV SOLN
1000.0000 mg | Freq: Once | INTRAVENOUS | Status: AC
  Administered 2015-07-01: 1000 mg via INTRAVENOUS

## 2015-07-01 MED ORDER — SODIUM CHLORIDE 0.9 % IV SOLN
Freq: Once | INTRAVENOUS | Status: AC
  Administered 2015-07-01: 08:00:00 via INTRAVENOUS

## 2015-07-01 MED ORDER — FENTANYL CITRATE (PF) 100 MCG/2ML IJ SOLN
25.0000 ug | INTRAMUSCULAR | Status: DC | PRN
  Administered 2015-07-01 (×3): 25 ug via INTRAVENOUS

## 2015-07-01 MED ORDER — KETOROLAC TROMETHAMINE 30 MG/ML IJ SOLN: 30.0000 mg | Freq: Once | INTRAMUSCULAR | Status: DC
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
## 2015-07-01 NOTE — Progress Notes (Signed)
INR this am is 1.4.  Discharge instructions explained to pt.

## 2015-07-04 ENCOUNTER — Telehealth: Payer: Self-pay | Admitting: Radiology
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
## 2015-07-11 ENCOUNTER — Ambulatory Visit
Admission: RE | Admit: 2015-07-11 | Discharge: 2015-07-11 | Disposition: A | Discharge status: Home / Self Care | Payer: PPO | Source: Ambulatory Visit | Attending: Specialist | Admitting: Specialist

## 2015-07-11 DIAGNOSIS — IMO0002 Reserved for concepts with insufficient information to code with codable children: Secondary | ICD-10-CM

## 2015-07-11 NOTE — Progress Notes (Signed)
Chief Complaint: Patient was seen in follow up today after a right L3 vertebroplasty 07/01/2015.   Referring Physician(s): Beane,Jeffrey  History of Present Illness: Samantha Foley is a 82 y.o. female who folling 07/01/2015 L3 vertebroplasty reports a significant improvement in her pain which she now rates 5-6/10 versus 10/10 prior to the procedure.  She improved on the [employer] disability questionnaire as well, now scoring 12/24 compared to 24/24.  She still has significant pain in her right flank and extending to her right hip.  She does not recall having the right hip pain prior to the procedure.  She is still limited in her activities of daily living and has episodes where her pain is sever.     Past Medical History  Diagnosis Date  . Seasonal allergies   . Vision problem     wears contacts  . Arthritis   . Lactose intolerance   . GERD (gastroesophageal reflux disease)   . Food allergy     Scallops--no other seafood allergies  . Chronic back pain     Dr. Tonita Cong  . Chronic knee pain     Dr. Rhona Raider  . Colon polyp     Dr. Wynetta Emery  . PVC's (premature ventricular contractions)   . Impaired glucose tolerance   . Osteopenia   . Hyperlipidemia   . Hiatal hernia   . Atrial fibrillation     reported but not yet documented    Past Surgical History  Procedure Laterality Date  . Tonsillectomy and adenoidectomy  age 3  . Wisdom tooth extraction    . Vertebroplasty  05/2009  . Sebaceous cyst excision  2009    neck    Allergies: Ciprofloxacin; Scallops; Apixaban; Demerol; Milk-related compounds; Penicillins; Rivaroxaban; and Sudafed  Medications: Prior to Admission medications   Medication Sig Start Date End Date Taking? Authorizing Provider  Coenzyme Q10 ([employer]S COENZYME Q10) 400 MG CAPS Take by mouth.    Historical Provider, MD  DUREZOL 0.05 % EMUL USE 1 DROP FOUR TIMES A DAY INTO THE LEFT EYE 04/10/15   Historical Provider, MD  ESTRACE VAGINAL 0.1 MG/GM vaginal  cream FILL APPLICATOR TO 1/2 GRAM MARK AND INSERT VAGINALLY TWICE WEEKLY 05/05/15   Historical Provider, MD  omeprazole (PRILOSEC) 20 MG capsule Take 20 mg by mouth 2 (two) times daily.    Historical Provider, MD  Red Yeast Rice Extract 600 MG CAPS Take by mouth.    Historical Provider, MD  traMADol (ULTRAM) 50 MG tablet TAKE 1-2 TABLET BY MOUTH EVERY SIX HOURS AS NEEDED FOR PAIN 06/16/15   Historical Provider, MD  warfarin (COUMADIN) 1 MG tablet TAKE AS DIRECTED PER COUMADIN CLINIC 04/20/15   Deboraha Sprang, MD     Family History  Problem Relation Age of Onset  . Rheumatic fever Mother   . Diabetes Mother   . Heart disease Mother     rheumatic heart disease  . Cancer Sister 19    breast cancer  . Cancer Brother     bladder and prostate cancer  . Migraines Daughter   . Cancer Sister     leukemia at 72; colon cancer in her late 30's--family ?'s dx    Social History   Social History  . Marital Status: Married    Spouse Name: N/A  . Number of Children: 2  . Years of Education: N/A   Occupational History  . retired (ran [employer] with her husband)    Social History Main Topics  .  Smoking status: Never Smoker   . Smokeless tobacco: Never Used  . Alcohol Use: No     Comment: maybe one glass per year.  . Drug Use: No  . Sexual Activity: Not Currently   Other Topics Concern  . Not on file   Social History Narrative   Lives with her husband.  No pets.  Daughter in [place], [place]; Daughter in [place]. Cares for her 2 grandsons    ECOG Status: 1 - Symptomatic but completely ambulatory  Review of Systems: A 12 point ROS discussed and pertinent positives are indicated in the HPI above.  All other systems are negative.  Review of Systems  Constitutional: Positive for activity change and appetite change.  All other systems reviewed and are negative. She is still limited in her activity and has a limited appetite when having pain.  Vital Signs: BP 157/69 mmHg  Pulse 68   Temp(Src) 98.1 F (36.7 C) (Oral)  SpO2 99%  Physical Exam  Nursing note and vitals reviewed. The wound has healed well.  Physical exam is otherwise deferred.  Mallampati Score:     Imaging: Dg Radiologist Eval And Mgmt  06/24/2015  CONSULTATION: Patient Name: Samantha Foley, Samantha Foley Patient DOB:   Nov 28, 1929 Age: 82 y/o MRN: [id number] Patient's Current Pain Level: 8 Dear Dr. Tonita Cong ; I saw your patient in consultation for possible L3 vertebroplasty. 81 year old female,   with history of osteoporosis, fell on 06/03/2015 She has had severe unremitting back pain since then. Her daughters accompany her today. Plain films demonstrated a mild L3 compression fracture at that time which was confirmed as acute on MR from [facility] dated 06/17/2015. There is no retropulsion or epidural hematoma. Her back pain is severe without radiation to the hips and legs. She rates her pain as 10/10 without relief in the sitting, standing, or lying positions. She is no relief with narcotics. She rates a 24 out of 24 on the [employer] disability questionnaire which is quite severe. On plain films today there does not appear to be significant progression of wedging of L3. Comorbidities include osteopenia with history of previous L1 compression fracture successfully treated with vertebroplasty in 2010, hyperlipidemia, and atrial fibrillation for which she is anticoagulated. She is not smoke or use alcohol. She is allergic to Cipro, Demerol, and penicillin. She is on warfarin, and we have authorization to stop that for the procedure from her internal medicine physicians. On exam she arises extremely slowly from chair with assistance. There is no weakness or numbness of arms and legs. The skin of the back is warm and dry. There is severe tenderness in the region of the L3 spinous process to fluoroscopic palpation. There is no radicular component. The risks and benefits of vertebroplasty were discussed with the patient including increased  pain, infection, bleeding, and no improvement. They wish to proceed. The patient is in such severe pain, in my estimation, she is at risk for additional falls which could result in further collapse of L3, a new lumbar compression fracture, or even hip fracture. She has failed consider conservative management, and is worse than when she fell. We are proceeding with plans to perform vertebroplasty on 06/27/2015 pending insurance authorization. IMPRESSION: Painful posttraumatic osteopenic compression fracture of L3 as described above. I spent approximate 30 minutes in consultation and review of imaging studies. Electronically Signed   By: Staci Righter M.D.   On: 06/24/2015 15:06   Dg Epidural Veno/verte Bropl  07/01/2015  CLINICAL DATA:  Osteoporotic L3 compression  fracture with persistent pain. FLUOROSCOPY TIME:  Fluoroscopy Time:  4 minutes Number of Acquired Images:  13 PROCEDURE: L3 VERTEBROPLASTY: Operator:  Daurice Ovando Medications utilized: Versed 1.0 mg IV, Fentanyl 75 mcg IV, and Toradol 30 mg IV after the procedure. Vancomycin 1 g IV was given prior to the procedure for antibiotic prophylaxis. Following a full explanation of the procedure along with the potentially associated complications, an informed witnessed consent was obtained. The patient was positioned prone on the fluoroscopic table. The skin was prepped and draped in the usual sterile fashion. The L3 vertebral body was identified and the right superficial soft tissues were anesthetized with 1% lidocaine to the level of the pedicle. A small skin incision was made. A 13 gauge bone needle was then advanced through the pedicle into the anterior one-third of the vertebral body. At this time, methylmethacrylate mixture was reconstituted. Using intermittent fluoroscopy, the methylmethacrylate mixture was then injected into the L3 vertebral body. And excellent trabecular pattern was achieved in the inferior anterior aspect of the vertebral body. A aphasia  near the superior endplate fracture lead to the right lateral paravertebral vein with cement extending anteriorly. The injection was halted to allow this cement to occlude the vein near the vertebral body. I was then able to inject additional methylmethacrylate without significant extrusion into the vein. The cement crossed midline although was predominantly on the right. The needle was then retrieved and removed. Hemostasis was achieved at the skin entry site. There were no acute complications. Patient tolerated the procedure well. IMPRESSION: Technically successful L3 vertebroplasty. The methylmethacrylate is predominate on the right but does cross midline. Extrusion of cement into a right paravertebral vein which extended anteriorly towards the IVC. There the extrusion was controlled as above. Electronically Signed   By: San Morelle M.D.   On: 07/01/2015 11:34    Labs:  CBC:  Recent Labs  06/24/15 0243  WBC 7.0  HGB 13.3  HCT 38.1  PLT 226    COAGS:  Recent Labs  06/02/15 1219 06/28/15 0957 06/30/15 1630 07/01/15 0709  INR 2.2 2.6 1.6 1.4    BMP: No results for input(s): NA, K, CL, CO2, GLUCOSE, BUN, CALCIUM, CREATININE, GFRNONAA, GFRAA in the last 8760 hours.  Invalid input(s): CMP  LIVER FUNCTION TESTS: No results for input(s): BILITOT, AST, ALT, ALKPHOS, PROT, ALBUMIN in the last 8760 hours.  TUMOR MARKERS: No results for input(s): AFPTM, CEA, CA199, CHROMGRNA in the last 8760 hours.  Assessment and Plan:  1.  Improved back pain following VP for osteoporotic compression fracture at L3. 2.  Persistent radicular pain.     Review of her most recent lumbar spine MRI compared to 2010 reveals progression of central canal spinal stenosis at L3/4 & particularly L4/5.  It is likely that she is experiencing pain from her spinal stenosis rather than the fracture at this point.  She would likely benefit from a diagnostic and therapeutic epidural steroid injection on the  right at L3/4.  As she will need to be off her coumadin for this again, this will need to be arranged.  This injection could be done here or in Dr. Reather Littler office at his discretion.  I have let the patient and her daughter know that either we or Dr. Reather Littler office will be in touch with her in the next 24 hours.   If the epidural injection does not relieve her persistent pain, further imaging may be useful to determine the etiology of her pain.    Electronically  Signed: Yarisa Lynam W 07/11/2015, 5:28 PM   I spent a total of  15 Minutes in face to face in clinical consultation, greater than 50% of which was counseling/coordinating care for Mrs. Smalls.

## 2015-07-13 ENCOUNTER — Ambulatory Visit
Admission: RE | Admit: 2015-07-13 | Discharge: 2015-07-13 | Disposition: A | Discharge status: Home / Self Care | Payer: PPO | Source: Ambulatory Visit | Attending: Obstetrics & Gynecology | Admitting: Obstetrics & Gynecology

## 2015-07-13 ENCOUNTER — Telehealth: Payer: Self-pay | Admitting: Pharmacist

## 2015-07-13 DIAGNOSIS — R229 Localized swelling, mass and lump, unspecified: Principal | ICD-10-CM

## 2015-07-13 DIAGNOSIS — N63 Unspecified lump in breast: Secondary | ICD-10-CM | POA: Diagnosis not present

## 2015-07-13 DIAGNOSIS — IMO0002 Reserved for concepts with insufficient information to code with codable children: Secondary | ICD-10-CM

## 2015-07-13 NOTE — Telephone Encounter (Signed)
Received clearance form for pt to have an injection on 07/19/15 at [facility]. Pt on Coumadin for afib, CHADS2 score is 1, ok to hold Coumadin x5 days with no Lovenox bridge. Clearance form faxed back on 07/13/15.

## 2015-07-14 ENCOUNTER — Ambulatory Visit (INDEPENDENT_AMBULATORY_CARE_PROVIDER_SITE_OTHER): Payer: PPO | Admitting: *Deleted

## 2015-07-14 DIAGNOSIS — I4891 Unspecified atrial fibrillation: Secondary | ICD-10-CM | POA: Diagnosis not present

## 2015-07-14 LAB — POCT INR: INR: 1.9

## 2015-07-19 ENCOUNTER — Ambulatory Visit (INDEPENDENT_AMBULATORY_CARE_PROVIDER_SITE_OTHER): Payer: PPO | Admitting: Pharmacist Clinician (PhC)/ Clinical Pharmacy Specialist

## 2015-07-19 DIAGNOSIS — I4891 Unspecified atrial fibrillation: Secondary | ICD-10-CM

## 2015-07-19 DIAGNOSIS — M4806 Spinal stenosis, lumbar region: Secondary | ICD-10-CM | POA: Diagnosis not present

## 2015-07-19 LAB — POCT INR: INR: 1.4

## 2015-07-28 ENCOUNTER — Ambulatory Visit (INDEPENDENT_AMBULATORY_CARE_PROVIDER_SITE_OTHER): Payer: PPO | Admitting: *Deleted

## 2015-07-28 DIAGNOSIS — I4891 Unspecified atrial fibrillation: Secondary | ICD-10-CM | POA: Diagnosis not present

## 2015-07-28 LAB — POCT INR: INR: 1.9

## 2015-08-02 DIAGNOSIS — M4806 Spinal stenosis, lumbar region: Secondary | ICD-10-CM | POA: Diagnosis not present

## 2015-08-04 ENCOUNTER — Ambulatory Visit: Payer: PPO | Admitting: Internal Medicine

## 2015-08-04 ENCOUNTER — Telehealth: Payer: Self-pay | Admitting: Internal Medicine

## 2015-08-04 NOTE — Telephone Encounter (Signed)
Pt was at ortho app 2 days ago and bp was high 180/104 and was told to f/u with pcp. Pt woke up at 5 am this morning with heart racing and cold sweats. Took bp 180/104 does not know pulse. She took it several times and it remained in that range for over an hour.  I had her take bp/p while we were on the phone, it was 167//73 p 78. Pt is feeling stressed, had 2 back procedures, needs hysterectomy and lost her husband less than a year ago.  Pt has an app with PCP Dr Nyoka Cowden this Monday 08/08/15. After a long conversation pt was advised to take BP/P 3 x a day till her app with PCP.  We discussed reduced caffeine, meditation etc... Pt is unable to exercise.  She was encouraged to call with further questions or concerns, she was given a yearly app with Dr Caryl Comes 10/13/15. Pt  felt relieved at end of the conversation.  She was made aware that dr/ nurse would be updated.

## 2015-08-04 NOTE — Telephone Encounter (Signed)
Samantha Foley is calling because she is having some Tachycardia, Blood pressure is running high . Please Call

## 2015-08-05 DIAGNOSIS — D259 Leiomyoma of uterus, unspecified: Secondary | ICD-10-CM | POA: Diagnosis not present

## 2015-08-08 DIAGNOSIS — H6123 Impacted cerumen, bilateral: Secondary | ICD-10-CM | POA: Diagnosis not present

## 2015-08-08 DIAGNOSIS — M1990 Unspecified osteoarthritis, unspecified site: Secondary | ICD-10-CM | POA: Diagnosis not present

## 2015-08-08 DIAGNOSIS — I10 Essential (primary) hypertension: Secondary | ICD-10-CM | POA: Diagnosis not present

## 2015-08-08 NOTE — Telephone Encounter (Signed)
Dr. Caryl Comes- did you see this previously?

## 2015-08-09 NOTE — Telephone Encounter (Signed)
What did Dr Nyoka Cowden suggest

## 2015-08-09 NOTE — Telephone Encounter (Signed)
I spoke with the patient. She states she took her BP readings 3 x/ day since speaking with our office last week. She did see Dr. Nyoka Cowden yesterday and took the readings for him to review. Per the patient, Dr. Nyoka Cowden was fine with the readings she had taken and no changes were made.

## 2015-08-18 ENCOUNTER — Ambulatory Visit (INDEPENDENT_AMBULATORY_CARE_PROVIDER_SITE_OTHER): Payer: PPO | Admitting: Pharmacist

## 2015-08-18 DIAGNOSIS — I4891 Unspecified atrial fibrillation: Secondary | ICD-10-CM

## 2015-08-18 LAB — POCT INR: INR: 3

## 2015-08-19 DIAGNOSIS — S32030D Wedge compression fracture of third lumbar vertebra, subsequent encounter for fracture with routine healing: Secondary | ICD-10-CM | POA: Diagnosis not present

## 2015-08-22 ENCOUNTER — Other Ambulatory Visit: Payer: Self-pay | Admitting: Specialist

## 2015-08-22 DIAGNOSIS — S32030D Wedge compression fracture of third lumbar vertebra, subsequent encounter for fracture with routine healing: Secondary | ICD-10-CM

## 2015-08-24 ENCOUNTER — Other Ambulatory Visit: Payer: Self-pay | Admitting: Internal Medicine

## 2015-08-24 DIAGNOSIS — M810 Age-related osteoporosis without current pathological fracture: Secondary | ICD-10-CM

## 2015-09-01 ENCOUNTER — Ambulatory Visit
Admission: RE | Admit: 2015-09-01 | Discharge: 2015-09-01 | Disposition: A | Discharge status: Home / Self Care | Payer: PPO | Source: Ambulatory Visit | Attending: Specialist | Admitting: Specialist

## 2015-09-01 ENCOUNTER — Other Ambulatory Visit: Payer: Self-pay | Admitting: Specialist

## 2015-09-01 DIAGNOSIS — M545 Low back pain: Principal | ICD-10-CM

## 2015-09-01 DIAGNOSIS — S32030D Wedge compression fracture of third lumbar vertebra, subsequent encounter for fracture with routine healing: Secondary | ICD-10-CM

## 2015-09-01 DIAGNOSIS — G8929 Other chronic pain: Secondary | ICD-10-CM

## 2015-09-01 DIAGNOSIS — M25551 Pain in right hip: Secondary | ICD-10-CM | POA: Diagnosis not present

## 2015-09-01 NOTE — Progress Notes (Signed)
Patient ID: Samantha Foley, female   DOB: 1929/11/09, 82 y.o.   MRN: [id number]    Chief Complaint: Patient was seen in follow up today for persistent right hip pain following vertebroplasty 07/01/15.    Referring Physician(s): Beane,Jeffrey   History of Present Illness: Samantha Foley is a 82 y.o. female who underwent vertbroplasty 07/01/15.  She reports her midback pain is resolved.  She now has persistent pain extending to her right hip.  She had some improvement with an epidural injection provided by Dr. Nelva Bush.  Her pain has recurred.    Past Medical History  Diagnosis Date  . Seasonal allergies   . Vision problem     wears contacts  . Arthritis   . Lactose intolerance   . GERD (gastroesophageal reflux disease)   . Food allergy     Scallops--no other seafood allergies  . Chronic back pain     Dr. Tonita Cong  . Chronic knee pain     Dr. Rhona Raider  . Colon polyp     Dr. Wynetta Emery  . PVC's (premature ventricular contractions)   . Impaired glucose tolerance   . Osteopenia   . Hyperlipidemia   . Hiatal hernia   . Atrial fibrillation     reported but not yet documented    Past Surgical History  Procedure Laterality Date  . Tonsillectomy and adenoidectomy  age 70  . Wisdom tooth extraction    . Vertebroplasty  05/2009  . Sebaceous cyst excision  2009    neck    Allergies: Ciprofloxacin; Scallops; Apixaban; Demerol; Milk-related compounds; Penicillins; Rivaroxaban; and Sudafed  Medications: Prior to Admission medications   Medication Sig Start Date End Date Taking? Authorizing Provider  Coenzyme Q10 ([employer]S COENZYME Q10) 400 MG CAPS Take by mouth.    Historical Provider, MD  DUREZOL 0.05 % EMUL USE 1 DROP FOUR TIMES A DAY INTO THE LEFT EYE 04/10/15   Historical Provider, MD  ESTRACE VAGINAL 0.1 MG/GM vaginal cream FILL APPLICATOR TO 1/2 GRAM MARK AND INSERT VAGINALLY TWICE WEEKLY 05/05/15   Historical Provider, MD  omeprazole (PRILOSEC) 20 MG capsule Take 20 mg by mouth 2  (two) times daily.    Historical Provider, MD  Red Yeast Rice Extract 600 MG CAPS Take by mouth.    Historical Provider, MD  traMADol (ULTRAM) 50 MG tablet TAKE 1-2 TABLET BY MOUTH EVERY SIX HOURS AS NEEDED FOR PAIN 06/16/15   Historical Provider, MD  warfarin (COUMADIN) 1 MG tablet TAKE AS DIRECTED PER COUMADIN CLINIC 04/20/15   Deboraha Sprang, MD     Family History  Problem Relation Age of Onset  . Rheumatic fever Mother   . Diabetes Mother   . Heart disease Mother     rheumatic heart disease  . Cancer Sister 68    breast cancer  . Cancer Brother     bladder and prostate cancer  . Migraines Daughter   . Cancer Sister     leukemia at 52; colon cancer in her late 30's--family ?'s dx    Social History   Social History  . Marital Status: Married    Spouse Name: N/A  . Number of Children: 2  . Years of Education: N/A   Occupational History  . retired (ran [employer] with her husband)    Social History Main Topics  . Smoking status: Never Smoker   . Smokeless tobacco: Never Used  . Alcohol Use: No     Comment: maybe one glass per year.  Marland Kitchen  Drug Use: No  . Sexual Activity: Not Currently   Other Topics Concern  . Not on file   Social History Narrative   Lives with her husband.  No pets.  Daughter in [place], [place]; Daughter in [place]. Cares for her 2 grandsons    ECOG Status: 1 - Symptomatic but completely ambulatory  Review of Systems: A 12 point ROS discussed and pertinent positives are indicated in the HPI above.  All other systems are negative.  Review of Systems  All other systems reviewed and are negative.   Vital Signs: BP 156/64 mmHg  Pulse 68  Temp(Src) 97.6 F (36.4 C) (Oral)  Physical Exam  Constitutional: She appears well-developed and well-nourished.  Musculoskeletal: She exhibits no tenderness.       Lumbar back: She exhibits no tenderness, no swelling, no edema and no pain.   The wound sites have healed well.  Vitals reviewed.   Mallampati  Score:     Imaging: No results found.  Labs:  CBC:  Recent Labs  06/24/15 0243  WBC 7.0  HGB 13.3  HCT 38.1  PLT 226    COAGS:  Recent Labs  07/14/15 1435 07/19/15 0835 07/28/15 1216 08/18/15 1201  INR 1.9 1.4 1.9 3.0    BMP: No results for input(s): NA, K, CL, CO2, GLUCOSE, BUN, CALCIUM, CREATININE, GFRNONAA, GFRAA in the last 8760 hours.  Invalid input(s): CMP  LIVER FUNCTION TESTS: No results for input(s): BILITOT, AST, ALT, ALKPHOS, PROT, ALBUMIN in the last 8760 hours.  TUMOR MARKERS: No results for input(s): AFPTM, CEA, CA199, CHROMGRNA in the last 8760 hours.  Assessment and Plan:  Persistent right hip radicular pain likely related to spinal stenosis. Given a positive response to a previous injection, a folllow-up injection would likely be helpful.  She has asked that I perform this injection.   I will be happy to do so.  She will need to stop her coumadin prior to the injection.    Electronically Signed: Osker Ayoub W 09/01/2015, 3:40 PM   I spent a total of   10 Minutes in face to face in clinical consultation, greater than 50% of which was counseling/coordinating care.

## 2015-09-06 ENCOUNTER — Other Ambulatory Visit: Payer: PPO

## 2015-09-08 DIAGNOSIS — M25562 Pain in left knee: Secondary | ICD-10-CM | POA: Diagnosis not present

## 2015-09-08 DIAGNOSIS — M1712 Unilateral primary osteoarthritis, left knee: Secondary | ICD-10-CM | POA: Diagnosis not present

## 2015-09-15 ENCOUNTER — Ambulatory Visit (INDEPENDENT_AMBULATORY_CARE_PROVIDER_SITE_OTHER): Payer: PPO | Admitting: *Deleted

## 2015-09-15 ENCOUNTER — Other Ambulatory Visit: Payer: PPO

## 2015-09-15 DIAGNOSIS — I4891 Unspecified atrial fibrillation: Secondary | ICD-10-CM

## 2015-09-15 LAB — POCT INR: INR: 2.4

## 2015-09-19 ENCOUNTER — Other Ambulatory Visit: Payer: PPO

## 2015-09-23 DIAGNOSIS — I119 Hypertensive heart disease without heart failure: Secondary | ICD-10-CM | POA: Diagnosis not present

## 2015-09-23 DIAGNOSIS — K219 Gastro-esophageal reflux disease without esophagitis: Secondary | ICD-10-CM | POA: Diagnosis not present

## 2015-09-23 DIAGNOSIS — H81399 Other peripheral vertigo, unspecified ear: Secondary | ICD-10-CM | POA: Diagnosis not present

## 2015-10-04 ENCOUNTER — Other Ambulatory Visit: Payer: Self-pay | Admitting: *Deleted

## 2015-10-04 MED ORDER — WARFARIN SODIUM 1 MG PO TABS: ORAL_TABLET | ORAL | Status: DC

## 2015-10-11 DIAGNOSIS — R079 Chest pain, unspecified: Secondary | ICD-10-CM | POA: Diagnosis not present

## 2015-10-12 NOTE — Progress Notes (Signed)
Patient Care Team: Levin Erp, MD as PCP - General (Internal Medicine)   HPI  Samantha Foley is a 82 y.o. female Seen in followup for paroxysmal atrial fibrillation of long-standing  She's been having problems with dizziness which she describes to the initiation of her apixaban and we went back to warfain, dizziness resolved  She is having infrequent atrial fibrillation. She is tolerating her Coumadin adequately without bleeding. She has had episodes of left shoulder dis comfort. These had been fleeting but the other day had one lasted about 6-hrs she saw her PCP. His ECG was apparently unrevealing    Her husband died about a year ago,  He called her [name]     Past Medical History  Diagnosis Date  . Seasonal allergies   . Vision problem     wears contacts  . Arthritis   . Lactose intolerance   . GERD (gastroesophageal reflux disease)   . Food allergy     Scallops--no other seafood allergies  . Chronic back pain     Dr. Tonita Cong  . Chronic knee pain     Dr. Rhona Raider  . Colon polyp     Dr. Wynetta Emery  . PVC's (premature ventricular contractions)   . Impaired glucose tolerance   . Osteopenia   . Hyperlipidemia   . Hiatal hernia   . Atrial fibrillation     reported but not yet documented    Past Surgical History  Procedure Laterality Date  . Tonsillectomy and adenoidectomy  age 84  . Wisdom tooth extraction    . Vertebroplasty  05/2009  . Sebaceous cyst excision  2009    neck    Current Outpatient Prescriptions  Medication Sig Dispense Refill  . Coenzyme Q10 (CVS COENZYME Q10) 400 MG CAPS Take by mouth.    . DUREZOL 0.05 % EMUL USE 1 DROP FOUR TIMES A DAY INTO THE LEFT EYE  0  . ESTRACE VAGINAL 0.1 MG/GM vaginal cream FILL APPLICATOR TO 1/2 GRAM MARK AND INSERT VAGINALLY TWICE WEEKLY  11  . omeprazole (PRILOSEC) 20 MG capsule Take 20 mg by mouth 2 (two) times daily.    . Red Yeast Rice Extract 600 MG CAPS Take by mouth.    . traMADol (ULTRAM) 50 MG  tablet TAKE 1-2 TABLET BY MOUTH EVERY SIX HOURS AS NEEDED FOR PAIN  0  . warfarin (COUMADIN) 1 MG tablet TAKE 1-1 1/2 TABLETS DAILY AS DIRECTED PER COUMADIN CLINIC 40 tablet 3   No current facility-administered medications for this visit.    Allergies  Allergen Reactions  . Ciprofloxacin Shortness Of Breath and Rash  . Scallops [Shellfish Allergy] Anaphylaxis  . Apixaban Other (See Comments)    fatigue  . Demerol Other (See Comments)    Patient states it made her crazy and sleep for 12 hours  . Milk-Related Compounds Diarrhea  . Penicillins Other (See Comments)    unknown  . Rivaroxaban Other (See Comments)    Pt unsure but d/c use  . Sudafed [Pseudoephedrine Hcl] Other (See Comments)    hyperactive    Review of Systems negative except from HPI and PMH  Physical Exam There were no vitals taken for this visit. Well developed and well nourished in no acute distress HENT normal E scleral and icterus clear Neck Supple JVP flat; carotids brisk and full Clear to ausculation  Regular rate and rhythm, no murmurs gallops or rub Soft with active bowel sounds No clubbing cyanosis no  Edema  Alert and oriented, grossly normal motor and sensory function Skin Warm and Dry  NSR @80  Intervals 15/08/37 Sinus arrhythmia   Assessment and  Plan  Paroxysmal atrial fibrillation    Chest pain atypical  Patient has had infrequent atrial fibrillation. She remains on warfarin having not tolerated NOACs.    Atypical chest pain is unlikely to be cardiac; she has had a negative Myoview 7/15 was reviewed today. We'll repeattoday

## 2015-10-13 ENCOUNTER — Ambulatory Visit (INDEPENDENT_AMBULATORY_CARE_PROVIDER_SITE_OTHER): Payer: PPO | Admitting: Internal Medicine

## 2015-10-13 ENCOUNTER — Ambulatory Visit (INDEPENDENT_AMBULATORY_CARE_PROVIDER_SITE_OTHER): Payer: PPO | Admitting: *Deleted

## 2015-10-13 VITALS — BP 114/62 | HR 80 | Ht 62.5 in | Wt 161.5 lb

## 2015-10-13 DIAGNOSIS — I480 Paroxysmal atrial fibrillation: Secondary | ICD-10-CM

## 2015-10-13 DIAGNOSIS — I4891 Unspecified atrial fibrillation: Secondary | ICD-10-CM | POA: Diagnosis not present

## 2015-10-13 DIAGNOSIS — R079 Chest pain, unspecified: Secondary | ICD-10-CM

## 2015-10-13 LAB — POCT INR: INR: 2.7

## 2015-10-13 NOTE — Patient Instructions (Signed)
Medication Instructions: - Your physician recommends that you continue on your current medications as directed. Please refer to the Current Medication list given to you today.  Labwork: - none  Procedures/Testing: - Your physician has requested that you have a lexiscan myoview. For further information please visit [email or web address]. Please follow instruction sheet, as given.  Follow-Up: - Your physician wants you to follow-up in: 1 year with Roderic Palau, NP You will receive a reminder letter in the mail two months in advance. If you don't receive a letter, please call our office to schedule the follow-up appointment.  Any Additional Special Instructions Will Be Listed Below (If Applicable).     If you need a refill on your cardiac medications before your next appointment, please call your pharmacy.

## 2015-10-14 ENCOUNTER — Other Ambulatory Visit: Payer: Self-pay | Admitting: Internal Medicine

## 2015-10-14 DIAGNOSIS — M79622 Pain in left upper arm: Secondary | ICD-10-CM

## 2015-10-14 DIAGNOSIS — M79629 Pain in unspecified upper arm: Secondary | ICD-10-CM

## 2015-10-27 ENCOUNTER — Telehealth (HOSPITAL_COMMUNITY): Payer: Self-pay | Admitting: *Deleted

## 2015-10-27 NOTE — Telephone Encounter (Signed)
Patient given detailed instructions per Myocardial Perfusion Study Information Sheet for the test on 11/02/15. Patient notified to arrive 15 minutes early and that it is imperative to arrive on time for appointment to keep from having the test rescheduled.  If you need to cancel or reschedule your appointment, please call the office within 24 hours of your appointment. Failure to do so may result in a cancellation of your appointment, and a $50 no show fee. Patient verbalized understanding. Abdulrahman Bracey J Derrico Zhong, RN  

## 2015-11-01 ENCOUNTER — Ambulatory Visit
Admission: RE | Admit: 2015-11-01 | Discharge: 2015-11-01 | Disposition: A | Discharge status: Home / Self Care | Payer: PPO | Source: Ambulatory Visit | Attending: Internal Medicine | Admitting: Internal Medicine

## 2015-11-01 DIAGNOSIS — M810 Age-related osteoporosis without current pathological fracture: Secondary | ICD-10-CM

## 2015-11-01 DIAGNOSIS — Z780 Asymptomatic menopausal state: Secondary | ICD-10-CM | POA: Diagnosis not present

## 2015-11-01 DIAGNOSIS — M8589 Other specified disorders of bone density and structure, multiple sites: Secondary | ICD-10-CM | POA: Diagnosis not present

## 2015-11-02 ENCOUNTER — Ambulatory Visit (HOSPITAL_COMMUNITY): Payer: PPO | Attending: Internal Medicine

## 2015-11-02 DIAGNOSIS — I4891 Unspecified atrial fibrillation: Secondary | ICD-10-CM | POA: Insufficient documentation

## 2015-11-02 DIAGNOSIS — R079 Chest pain, unspecified: Secondary | ICD-10-CM | POA: Insufficient documentation

## 2015-11-02 LAB — MYOCARDIAL PERFUSION IMAGING
CHL CUP NUCLEAR SRS: 5
CHL CUP NUCLEAR SSS: 7
CHL CUP RESTING HR STRESS: 62 {beats}/min
LV sys vol: 15 mL
LVDIAVOL: 59 mL (ref 46–106)
NUC STRESS TID: 1.14
Peak HR: 101 {beats}/min
RATE: 0.28
SDS: 2

## 2015-11-02 MED ORDER — TECHNETIUM TC 99M SESTAMIBI GENERIC - CARDIOLITE
31.3000 | Freq: Once | INTRAVENOUS | Status: AC | PRN
  Administered 2015-11-02: 31.3 via INTRAVENOUS

## 2015-11-02 MED ORDER — REGADENOSON 0.4 MG/5ML IV SOLN
0.4000 mg | Freq: Once | INTRAVENOUS | Status: AC
  Administered 2015-11-02: 0.4 mg via INTRAVENOUS

## 2015-11-02 MED ORDER — TECHNETIUM TC 99M SESTAMIBI GENERIC - CARDIOLITE
10.7000 | Freq: Once | INTRAVENOUS | Status: AC | PRN
  Administered 2015-11-02: 11 via INTRAVENOUS

## 2015-11-09 ENCOUNTER — Ambulatory Visit
Admission: RE | Admit: 2015-11-09 | Discharge: 2015-11-09 | Disposition: A | Discharge status: Home / Self Care | Payer: PPO | Source: Ambulatory Visit | Attending: Internal Medicine | Admitting: Internal Medicine

## 2015-11-09 DIAGNOSIS — R918 Other nonspecific abnormal finding of lung field: Secondary | ICD-10-CM | POA: Diagnosis not present

## 2015-11-09 DIAGNOSIS — M79622 Pain in left upper arm: Secondary | ICD-10-CM

## 2015-11-09 MED ORDER — IOPAMIDOL (ISOVUE-300) INJECTION 61%
75.0000 mL | Freq: Once | INTRAVENOUS | Status: AC | PRN
  Administered 2015-11-09: 75 mL via INTRAVENOUS

## 2015-11-17 DIAGNOSIS — E739 Lactose intolerance, unspecified: Secondary | ICD-10-CM | POA: Diagnosis not present

## 2015-11-17 DIAGNOSIS — Q782 Osteopetrosis: Secondary | ICD-10-CM | POA: Diagnosis not present

## 2015-11-17 DIAGNOSIS — R079 Chest pain, unspecified: Secondary | ICD-10-CM | POA: Diagnosis not present

## 2015-11-24 ENCOUNTER — Ambulatory Visit (INDEPENDENT_AMBULATORY_CARE_PROVIDER_SITE_OTHER): Payer: PPO | Admitting: *Deleted

## 2015-11-24 DIAGNOSIS — I480 Paroxysmal atrial fibrillation: Secondary | ICD-10-CM

## 2015-11-24 DIAGNOSIS — I4891 Unspecified atrial fibrillation: Secondary | ICD-10-CM

## 2015-11-24 LAB — POCT INR: INR: 3.4

## 2015-12-09 ENCOUNTER — Telehealth: Payer: Self-pay | Admitting: Internal Medicine

## 2015-12-09 NOTE — Telephone Encounter (Signed)
New message       The pt called to make sure she can take the antibiotic that her OB-GYN put her on for a UTI, it was a antibiotic and she hasn't taken it cause she was afraid it might infer with her coumadin, the pharmacist told her to check with her cardiologist before taking it

## 2015-12-09 NOTE — Telephone Encounter (Signed)
Returned call to patient.She stated she has a UTI and was prescribed Nitrofurantion 100 mg twice a day today.She wanted to make sure ok to take.Advised ok to start taking.She has appointment for INR check 12/15/15.Advised I will send message to coumadin clinic to see if you need a sooner INR check.

## 2015-12-09 NOTE — Telephone Encounter (Signed)
Spoke with pt and informed her there is not any interaction with Coumadin and Nitrofurantion. PLease call if she gets any other med change.

## 2015-12-15 ENCOUNTER — Ambulatory Visit (INDEPENDENT_AMBULATORY_CARE_PROVIDER_SITE_OTHER): Payer: PPO | Admitting: *Deleted

## 2015-12-15 DIAGNOSIS — I4891 Unspecified atrial fibrillation: Secondary | ICD-10-CM | POA: Diagnosis not present

## 2015-12-15 DIAGNOSIS — R300 Dysuria: Secondary | ICD-10-CM | POA: Diagnosis not present

## 2015-12-15 DIAGNOSIS — I480 Paroxysmal atrial fibrillation: Secondary | ICD-10-CM

## 2015-12-15 LAB — POCT INR: INR: 3.3

## 2015-12-28 DIAGNOSIS — N814 Uterovaginal prolapse, unspecified: Secondary | ICD-10-CM | POA: Diagnosis not present

## 2015-12-29 ENCOUNTER — Ambulatory Visit (INDEPENDENT_AMBULATORY_CARE_PROVIDER_SITE_OTHER): Payer: PPO | Admitting: *Deleted

## 2015-12-29 DIAGNOSIS — I4891 Unspecified atrial fibrillation: Secondary | ICD-10-CM

## 2015-12-29 DIAGNOSIS — I480 Paroxysmal atrial fibrillation: Secondary | ICD-10-CM | POA: Diagnosis not present

## 2015-12-29 LAB — POCT INR: INR: 2.6

## 2016-01-12 ENCOUNTER — Other Ambulatory Visit: Payer: Self-pay | Admitting: Obstetrics & Gynecology

## 2016-01-12 DIAGNOSIS — N6320 Unspecified lump in the left breast, unspecified quadrant: Secondary | ICD-10-CM

## 2016-01-19 ENCOUNTER — Ambulatory Visit
Admission: RE | Admit: 2016-01-19 | Discharge: 2016-01-19 | Disposition: A | Discharge status: Home / Self Care | Payer: PPO | Source: Ambulatory Visit | Attending: Obstetrics & Gynecology | Admitting: Obstetrics & Gynecology

## 2016-01-19 ENCOUNTER — Ambulatory Visit (INDEPENDENT_AMBULATORY_CARE_PROVIDER_SITE_OTHER): Payer: PPO | Admitting: *Deleted

## 2016-01-19 DIAGNOSIS — I480 Paroxysmal atrial fibrillation: Secondary | ICD-10-CM

## 2016-01-19 DIAGNOSIS — N6320 Unspecified lump in the left breast, unspecified quadrant: Secondary | ICD-10-CM

## 2016-01-19 DIAGNOSIS — I4891 Unspecified atrial fibrillation: Secondary | ICD-10-CM | POA: Diagnosis not present

## 2016-01-19 DIAGNOSIS — R928 Other abnormal and inconclusive findings on diagnostic imaging of breast: Secondary | ICD-10-CM | POA: Diagnosis not present

## 2016-01-19 LAB — POCT INR: INR: 2.2

## 2016-01-29 ENCOUNTER — Other Ambulatory Visit: Payer: Self-pay | Admitting: Internal Medicine

## 2016-02-02 DIAGNOSIS — I10 Essential (primary) hypertension: Secondary | ICD-10-CM | POA: Diagnosis not present

## 2016-02-02 DIAGNOSIS — D559 Anemia due to enzyme disorder, unspecified: Secondary | ICD-10-CM | POA: Diagnosis not present

## 2016-02-02 DIAGNOSIS — E7800 Pure hypercholesterolemia, unspecified: Secondary | ICD-10-CM | POA: Diagnosis not present

## 2016-02-02 DIAGNOSIS — E785 Hyperlipidemia, unspecified: Secondary | ICD-10-CM | POA: Diagnosis not present

## 2016-02-02 DIAGNOSIS — I4891 Unspecified atrial fibrillation: Secondary | ICD-10-CM | POA: Diagnosis not present

## 2016-02-02 DIAGNOSIS — Z0000 Encounter for general adult medical examination without abnormal findings: Secondary | ICD-10-CM | POA: Diagnosis not present

## 2016-02-10 ENCOUNTER — Encounter: Payer: Self-pay | Admitting: Physician Assistant

## 2016-02-12 NOTE — Progress Notes (Signed)
Cardiology Office Note Date:  02/13/2016  Patient ID:  Samantha Foley, Samantha Foley [birth year], MRN [id number] PCP:  Criselda Peaches, MD  Cardiologist:  Dr. Caryl Comes   Chief Complaint: pre-op, lap hysterectomy  History of Present Illness: Samantha Foley is a 82 y.o. female with history of PAFib, on warfarin, HLD, GERD, comes in today to be seen for Dr. Caryl Comes.  She last saw him April 2017, at that time doing well with some atypical CP c/o, and underwent normal stress testing.    Since then reports doing well, states the surgery is being done secondary to "eveything has fallen down", she denies any kind of CP, palpitations or SOB, no dizziness, near syncope or syncope.  She has had some lumbar back discomfort only.   She is scheduled for surgery 03/08/16, though not yet has seen the actual surgeon, she is scheduled to see her tomorrow.  As of yet, she does not know how many days they would like her off her warfarin.    She denies any bleeding or signs of bleeding.  She makes mention though of 3 times in the last 8-10 months or so that she needed to come off her warfarin, and despite being off several (up to 5 days) her INR ramained elevated for an unusually long period of time.  She reports her PMD does labs for her routinely.  Past Medical History:  Diagnosis Date  . Arthritis   . Atrial fibrillation ([place])    reported but not yet documented  . Chronic back pain    Dr. Tonita Cong  . Chronic knee pain    Dr. Rhona Raider  . Colon polyp    Dr. Wynetta Emery  . Food allergy    Scallops--no other seafood allergies  . GERD (gastroesophageal reflux disease)   . Hiatal hernia   . Hyperlipidemia   . Impaired glucose tolerance   . Lactose intolerance   . Osteopenia   . PVC's (premature ventricular contractions)   . Seasonal allergies   . Vision problem    wears contacts    Past Surgical History:  Procedure Laterality Date  . sebaceous cyst excision  2009   neck  . TONSILLECTOMY AND ADENOIDECTOMY   age 89  . VERTEBROPLASTY  05/2009  . WISDOM TOOTH EXTRACTION      Current Outpatient Prescriptions  Medication Sig Dispense Refill  . Cholecalciferol (VITAMIN D3) 1000 units CAPS Take 1 capsule by mouth daily.    Marland Kitchen estradiol (ESTRACE) 0.1 MG/GM vaginal cream Fill applicator to 1/2 gram mark and insert vaginally twice weekly    . omeprazole (PRILOSEC) 20 MG capsule Take 20 mg by mouth daily.     . traMADol (ULTRAM) 50 MG tablet TAKE 1-2 TABLET BY MOUTH EVERY SIX HOURS AS NEEDED FOR PAIN  0  . warfarin (COUMADIN) 1 MG tablet TAKE 1-1 1/2 TABLETS DAILY AS DIRECTED PER COUMADIN CLINIC 40 tablet 3   No current facility-administered medications for this visit.     Allergies:   Ciprofloxacin; Scallops [shellfish allergy]; Apixaban; Demerol; Milk-related compounds; Penicillins; Rivaroxaban; and Sudafed [pseudoephedrine hcl]   Social History:  The patient  reports that she has never smoked. She has never used smokeless tobacco. She reports that she does not drink alcohol or use drugs.   Family History:  The patient's family history includes Cancer in her brother and sister; Cancer (age of onset: 28) in her sister; Diabetes in her mother; Heart disease in her mother; Migraines in her daughter; Rheumatic fever in  her mother.  ROS:  Please see the history of present illness.  All other systems are reviewed and otherwise negative.   PHYSICAL EXAM:  VS:  BP 110/70   Pulse 85   Ht 5\' 2"  (1.575 m)   Wt 160 lb (72.6 kg)   BMI 29.26 kg/m  BMI: Body mass index is 29.26 kg/m. Well nourished, well developed, in no acute distress  HEENT: normocephalic, atraumatic  Neck: no JVD, carotid bruits or masses Cardiac:  normal S1, S2; RRR; no significant murmurs, no rubs, or gallops Lungs:  clear to auscultation bilaterally, no wheezing, rhonchi or rales  Abd: soft, nontender MS: no deformity or atrophy Ext: no edema  Skin: warm and dry, no rash Neuro:  No gross deficits appreciated Psych: euthymic mood,  full affect   EKG:  Done today and reviewed by myself, SR/sinus arrhythmia  11/02/15: lexiscan stress test Study Highlights   The left ventricular ejection fraction is hyperdynamic (>65%).  Nuclear stress EF: 74%.  There was no ST segment deviation noted during stress.  Defect 1: There is a small defect of moderate severity present in the mid inferolateral and apical lateral location. This is present at rest and not at stress. This is artifact.  The study is normal.  This is a low risk study.     10/30/12: Echocardiogram Study Conclusions - Left ventricle: The cavity size was normal. Wall thickness was increased in a pattern of mild LVH. There was focal basal hypertrophy. Systolic function was vigorous. The estimated ejection fraction was in the range of 65% to 70%. Wall motion was normal; there were no regional wall motion abnormalities. Doppler parameters are consistent with abnormal left ventricular relaxation (grade 1 diastolic dysfunction). - Aortic valve: Mild regurgitation. - Mitral valve: Mild regurgitation. - Atrial septum: No defect or patent foramen ovale was identified. - Pulmonary arteries: PA peak pressure: 74mm Hg (S).  Recent Labs: 06/24/2015: Hemoglobin 13.3; Platelets 226  No results found for requested labs within last 8760 hours.   CrCl cannot be calculated (Patient's most recent lab result is older than the maximum 21 days allowed.).   Wt Readings from Last 3 Encounters:  02/13/16 160 lb (72.6 kg)  10/13/15 161 lb 8 oz (73.3 kg)  09/23/14 166 lb 12.8 oz (75.7 kg)     Other studies reviewed: Additional studies/records reviewed today include: summarized above  ASSESSMENT AND PLAN:   1. PAFib    CHA2DS2Vasc is at least 3, on warfarin (historically intolerant of NOACs), managed with coumadin clinic    no bleeding or coumadin complications  2. Pre-op     Reviewed with Dr. Burt Knack (DOD), cleared for planned surgery given normal  stress test rcently     The patient sees the surgeon who will actually be doing her case tomorrow, unknown as of yet how many day they would like her off her coumadin.  Disposition: The patient requests that Dr. Caryl Comes specifically manage her warfarin pre-procedure since he knows her and historically how she tends to behave with her warfarin, I will bring this to his attention, she will let us know how many days the surgeon requests she be off pre-op, post-op will be as per the surgeon's instructions.  Otherwise, will see her back in 1 year with dr. Caryl Comes, sooner if needed.  Current medicines are reviewed at length with the patient today.  The patient did not have any concerns regarding medicines.  Haywood Lasso, PA-C 02/13/2016 12:50 PM     [facility]  [address] [place] [address] [phone] (office)  [phone] (fax)

## 2016-02-13 ENCOUNTER — Ambulatory Visit (INDEPENDENT_AMBULATORY_CARE_PROVIDER_SITE_OTHER): Payer: PPO | Admitting: Physician Assistant

## 2016-02-13 ENCOUNTER — Ambulatory Visit (INDEPENDENT_AMBULATORY_CARE_PROVIDER_SITE_OTHER): Payer: PPO

## 2016-02-13 VITALS — BP 110/70 | HR 85 | Ht 62.0 in | Wt 160.0 lb

## 2016-02-13 DIAGNOSIS — I480 Paroxysmal atrial fibrillation: Secondary | ICD-10-CM

## 2016-02-13 DIAGNOSIS — I4891 Unspecified atrial fibrillation: Secondary | ICD-10-CM | POA: Diagnosis not present

## 2016-02-13 DIAGNOSIS — Z01818 Encounter for other preprocedural examination: Secondary | ICD-10-CM

## 2016-02-13 LAB — POCT INR: INR: 2.5

## 2016-02-13 NOTE — Patient Instructions (Addendum)
Medication Instructions:   Your physician recommends that you continue on your current medications as directed. Please refer to the Current Medication list given to you today.   If you need a refill on your cardiac medications before your next appointment, please call your pharmacy.  Labwork: NONE ORDER TODAY    Testing/Procedures: NONE ORDER TODAY    Follow-Up: Your physician wants you to follow-up in: [place] .Marland KitchenYou will receive a reminder letter in the mail two months in advance. If you don't receive a letter, please call our office to schedule the follow-up appointment.     Any Other Special Instructions Will Be Listed Below (If Applicable).

## 2016-02-14 ENCOUNTER — Telehealth: Payer: Self-pay | Admitting: *Deleted

## 2016-02-14 DIAGNOSIS — N814 Uterovaginal prolapse, unspecified: Secondary | ICD-10-CM | POA: Diagnosis not present

## 2016-02-14 DIAGNOSIS — R300 Dysuria: Secondary | ICD-10-CM | POA: Diagnosis not present

## 2016-02-14 DIAGNOSIS — R350 Frequency of micturition: Secondary | ICD-10-CM | POA: Diagnosis not present

## 2016-02-14 NOTE — Telephone Encounter (Signed)
-----   Message from [facility], [place] sent at 02/13/2016  1:20 PM EDT ----- Dr. Caryl Comes,  I saw this patient for pre-op evaluation, reviewed with Dr. Burt Knack (DOD) today, and felt she could be cleared for planned surgery (lap hysterectomy, 02/06/16).  She sees the surgeon tomorrow unknown yet for how many days they will want her off her warfarin.  The patient was very specific thought that she wanted only Dr. Caryl Comes to make to be the one managing her warfarin pre-op, because you now that she does not tend to behave the way everyone else does and her warfarin levels stay elevated longer.  I told her I would get the message to you.  Thanks [employer]

## 2016-02-15 ENCOUNTER — Telehealth: Payer: Self-pay | Admitting: *Deleted

## 2016-02-15 NOTE — Telephone Encounter (Signed)
Her risk for coming off her coumadin faor her procedure should be low ( no record of TIA/CVA) Tell her plz we will get coumadin clinic to help [place] with the most expert advice as to when to hold the coumadin

## 2016-02-15 NOTE — Telephone Encounter (Signed)
Pt called to inform [facility] that she is having a hysterectomy on 03/08/16 by Dr. Dellis Filbert with [facility]. The pt stated she needed to be off her Coumadin starting today and until her surgery date; there is no documentation in EPIC from Dr. Caryl Comes at this time for clearance to hold Coumadin. Advised to Therefore, callled Dr. Assunta Curtis office & left a message regarding the situation & to see how long they would like her to hold Coumadin for the procedure. They stated Dr. Dellis Filbert was in Clinic & she may be able to return a callback today & gave [facility] telephone number

## 2016-02-15 NOTE — Telephone Encounter (Signed)
Spoke with Cyril Mourning at Dr. Dellis Filbert office & she stated that they would like to have the pt hold coumadin longer than 5 days for her hysterectomy on 03/08/16 & have the pt bridged with Lovenox.  Advised that we would need to have a clearance form stating this information sent to [facility] in order to have it reviewed by Pharmacist & Dr. Caryl Comes & she verbalized understanding.  She stated that Dr. Dellis Filbert would not be in the office but she would send it to [place] as soon as Dr. Redmond Pulling is back & [facility] fax # given.  Also, called the pt to inform her of this update & she verbalized understanding.

## 2016-02-16 NOTE — Telephone Encounter (Signed)
Clearance addressed in other 8/23 note.

## 2016-02-16 NOTE — Telephone Encounter (Signed)
Confirmed with Dr. Caryl Comes that pt should only be off Coumadin for 7 days max. Last dose of Coumadin September 6. Will check patient's INR in clinic the day before procedure. Pt is aware of this plan. Will route clearance to Dr. Dellis Filbert [phone].

## 2016-02-16 NOTE — Telephone Encounter (Signed)
Not sure who is telling patient she needs Lovenox or heparin, neither of these are required prior to procedure. She definitely does not need to be off her Coumadin for 3 weeks prior to the procedure with a heparin drip.Marland KitchenMarland KitchenShe has a very low cardiac risk - takes Coumadin for afib with CHADS2 score of 1 (age > 49). No reason she should need bridging at all. Pt has historically taken a little longer for her INR to fall prior to injection - INR fell from 1.9 to 1.5 in 4 days. Would recommend holding Coumadin x7 days rather than 5 prior to procedure, we can check INR the morning of. Will discuss with Dr. Caryl Comes this afternoon for confirmation.

## 2016-02-16 NOTE — Telephone Encounter (Signed)
I called and spoke with patient. She is aware that Dr. Caryl Comes is wanting the Coumadin clinic to help manage her coumadin prior to surgery. She states she is scheduled for surgery on 9/14, but her surgeon had recommended possibly having her take heparin and that she wanted to stop her warfarin 2 days ago for this. I advised I will forward to [facility] pharmacist and [place] since this is who she states she has been speaking with as well.  She was appreciative of the call.

## 2016-02-20 ENCOUNTER — Other Ambulatory Visit: Payer: Self-pay | Admitting: Obstetrics & Gynecology

## 2016-02-22 NOTE — Patient Instructions (Addendum)
Your procedure is scheduled on: March 08, 2016  Enter through the Main Entrance of [facility] at: 6 am  Pick up the phone at the desk and dial [phone].  Call this number if you have problems the morning of surgery: [phone].  Remember: Do NOT eat food:  Take these medicines the morning of surgery with a SIP OF WATER:none  Do NOT wear jewelry (body piercing), metal hair clips/bobby pins, make-up, or nail polish. Do NOT wear lotions, powders, or perfumes.  You may wear deoderant. Do NOT shave for 48 hours prior to surgery. Do NOT bring valuables to the hospital. Contacts, dentures, or bridgework may not be worn into surgery. Leave suitcase in car.  After surgery it may be brought to your room.  For patients admitted to the hospital, checkout time is 11:00 AM the day of discharge. Have a responsible adult drive you home and stay with you for 24 hours after your procedure

## 2016-02-23 ENCOUNTER — Encounter (HOSPITAL_COMMUNITY)
Admission: RE | Admit: 2016-02-23 | Discharge: 2016-02-23 | Disposition: A | Discharge status: Home / Self Care | Payer: PPO | Source: Ambulatory Visit | Attending: Obstetrics & Gynecology | Admitting: Obstetrics & Gynecology

## 2016-02-23 ENCOUNTER — Encounter (HOSPITAL_COMMUNITY): Payer: Self-pay

## 2016-02-23 DIAGNOSIS — Z01812 Encounter for preprocedural laboratory examination: Secondary | ICD-10-CM | POA: Insufficient documentation

## 2016-02-23 DIAGNOSIS — N814 Uterovaginal prolapse, unspecified: Secondary | ICD-10-CM | POA: Diagnosis not present

## 2016-02-23 HISTORY — DX: Cardiac arrhythmia, unspecified: I49.9

## 2016-02-23 LAB — PROTIME-INR
INR: 2.51
PROTHROMBIN TIME: 27.5 s — AB (ref 11.4–15.2)

## 2016-02-23 LAB — COMPREHENSIVE METABOLIC PANEL
ALK PHOS: 81 U/L (ref 38–126)
ALT: 23 U/L (ref 14–54)
AST: 21 U/L (ref 15–41)
Albumin: 3.9 g/dL (ref 3.5–5.0)
Anion gap: 5 (ref 5–15)
BILIRUBIN TOTAL: 0.4 mg/dL (ref 0.3–1.2)
BUN: 12 mg/dL (ref 6–20)
CALCIUM: 9.5 mg/dL (ref 8.9–10.3)
CO2: 30 mmol/L (ref 22–32)
Chloride: 104 mmol/L (ref 101–111)
Creatinine, Ser: 0.65 mg/dL (ref 0.44–1.00)
GFR calc non Af Amer: >60 mL/min (ref >60)
Glucose, Bld: 99 mg/dL (ref 65–99)
POTASSIUM: 4 mmol/L (ref 3.5–5.1)
Sodium: 139 mmol/L (ref 135–145)
TOTAL PROTEIN: 7.2 g/dL (ref 6.5–8.1)

## 2016-02-23 LAB — CBC
HEMATOCRIT: 39.6 % (ref 36.0–46.0)
HEMOGLOBIN: 13.2 g/dL (ref 12.0–15.0)
MCH: 29.1 pg (ref 26.0–34.0)
MCHC: 33.3 g/dL (ref 30.0–36.0)
MCV: 87.2 fL (ref 78.0–100.0)
Platelets: 224 10*3/uL (ref 150–400)
RBC: 4.54 MIL/uL (ref 3.87–5.11)
RDW: 14.5 % (ref 11.5–15.5)
WBC: 7.2 10*3/uL (ref 4.0–10.5)

## 2016-02-23 LAB — ABO/RH: ABO/RH(D): B NEG

## 2016-02-23 LAB — APTT: APTT: 36 s (ref 24–36)

## 2016-02-23 LAB — TYPE AND SCREEN
ABO/RH(D): B NEG
ANTIBODY SCREEN: NEGATIVE

## 2016-02-23 NOTE — Pre-Procedure Instructions (Signed)
Found in EPIC: NOTES 02/15/16 note from Dr. Caryl Comes 02/14/16 cardiac clearance

## 2016-02-23 NOTE — Pre-Procedure Instructions (Signed)
PT result routed to Dr. Dellis Filbert

## 2016-03-05 DIAGNOSIS — N390 Urinary tract infection, site not specified: Secondary | ICD-10-CM | POA: Diagnosis not present

## 2016-03-05 DIAGNOSIS — R300 Dysuria: Secondary | ICD-10-CM | POA: Diagnosis not present

## 2016-03-07 ENCOUNTER — Ambulatory Visit (INDEPENDENT_AMBULATORY_CARE_PROVIDER_SITE_OTHER): Payer: PPO | Admitting: *Deleted

## 2016-03-07 ENCOUNTER — Encounter (INDEPENDENT_AMBULATORY_CARE_PROVIDER_SITE_OTHER): Payer: Self-pay

## 2016-03-07 ENCOUNTER — Encounter (HOSPITAL_COMMUNITY): Payer: Self-pay | Admitting: Anesthesiology

## 2016-03-07 DIAGNOSIS — I4891 Unspecified atrial fibrillation: Secondary | ICD-10-CM | POA: Diagnosis not present

## 2016-03-07 DIAGNOSIS — I480 Paroxysmal atrial fibrillation: Secondary | ICD-10-CM

## 2016-03-07 LAB — POCT INR: INR: 1.4

## 2016-03-07 MED ORDER — GENTAMICIN SULFATE 40 MG/ML IJ SOLN
INTRAVENOUS | Status: AC
  Administered 2016-03-08: 100 mL via INTRAVENOUS
  Filled 2016-03-07: qty 9

## 2016-03-07 NOTE — Anesthesia Preprocedure Evaluation (Addendum)
Anesthesia Evaluation  Patient identified by MRN, date of birth, ID band Patient awake    Reviewed: Allergy & Precautions, NPO status , Patient's Chart, lab work & pertinent test results  Airway Mallampati: I       Dental no notable dental hx.    Pulmonary neg pulmonary ROS,    Pulmonary exam normal        Cardiovascular negative cardio ROS  + dysrhythmias Atrial Fibrillation  Rhythm:Irregular Rate:Normal     Neuro/Psych negative psych ROS   GI/Hepatic Neg liver ROS, GERD  Medicated and Poorly Controlled,  Endo/Other  negative endocrine ROS  Renal/GU negative Renal ROS  negative genitourinary   Musculoskeletal   Abdominal Normal abdominal exam  (+)   Peds negative pediatric ROS (+)  Hematology negative hematology ROS (+)   Anesthesia Other Findings   Reproductive/Obstetrics negative OB ROS                            Anesthesia Physical Anesthesia Plan  ASA: III  Anesthesia Plan: General   Post-op Pain Management:    Induction: Intravenous  Airway Management Planned: Oral ETT  Additional Equipment:   Intra-op Plan:   Post-operative Plan: Extubation in OR  Informed Consent: I have reviewed the patients History and Physical, chart, labs and discussed the procedure including the risks, benefits and alternatives for the proposed anesthesia with the patient or authorized representative who has indicated his/her understanding and acceptance.   Dental advisory given  Plan Discussed with: CRNA and Surgeon  Anesthesia Plan Comments:        Anesthesia Quick Evaluation

## 2016-03-07 NOTE — H&P (Signed)
Samantha Foley is an 82 y.o. female G3P3  RP:  Uterine Prolapse with Cystocele and SUI   Pertinent Gynecological History: Blood transfusions: none Sexually transmitted diseases: Neg Previous GYN Procedures: Neg  Last mammogram: normal  Last pap: normal OB History: G3P3   Menstrual History:  No LMP recorded. Patient is postmenopausal.    Past Medical History:  Diagnosis Date  . Arthritis   . Atrial fibrillation ([place])    reported but not yet documented  . Chronic back pain    Dr. Tonita Cong  . Chronic knee pain    Dr. Rhona Raider  . Colon polyp    Dr. Wynetta Emery  . Dysrhythmia    atrial fib- mild per pt- no meds  . Food allergy    Scallops--no other seafood allergies  . GERD (gastroesophageal reflux disease)   . Hiatal hernia   . Hyperlipidemia   . Impaired glucose tolerance   . Lactose intolerance   . Osteopenia   . PVC's (premature ventricular contractions)   . Seasonal allergies   . Vision problem    wears contacts    Past Surgical History:  Procedure Laterality Date  . BACK SURGERY     several vertbea ae rcemntekd  . sebaceous cyst excision  2009   neck  . TONSILLECTOMY AND ADENOIDECTOMY  age 56  . VERTEBROPLASTY  05/2009  . WISDOM TOOTH EXTRACTION      Family History  Problem Relation Age of Onset  . Rheumatic fever Mother   . Diabetes Mother   . Heart disease Mother     rheumatic heart disease  . Cancer Sister 31    breast cancer  . Cancer Brother     bladder and prostate cancer  . Migraines Daughter   . Cancer Sister     leukemia at 60; colon cancer in her late 30's--family ?'s dx    Social History:  reports that she has never smoked. She has never used smokeless tobacco. She reports that she does not drink alcohol or use drugs.  Allergies:  Allergies  Allergen Reactions  . Ciprofloxacin Shortness Of Breath and Rash  . Scallops [Shellfish Allergy] Anaphylaxis  . Apixaban Other (See Comments)    fatigue  . Demerol Other (See Comments)   Patient states it made her crazy and sleep for 12 hours  . Milk-Related Compounds Diarrhea  . Penicillins Other (See Comments)    unknown  . Rivaroxaban Other (See Comments)    Pt unsure but d/c use  . Sudafed [Pseudoephedrine Hcl] Other (See Comments)    hyperactive    No prescriptions prior to admission.    ROS Neg   Physical Exam  VSS   Uterine Prolapse with Cystocele.  No adnexal mass.  Results for orders placed or performed in visit on 03/07/16 (from the past 24 hour(s))  POCT INR     Status: None   Collection Time: 03/07/16 10:19 AM  Result Value Ref Range   INR 1.4     Assessment/Plan: Uterine Prolapse with Cystocele.  Pessaries expulsed, declines further attempts.  Decision to proceed with Robotic TLH/BSO/UteroSacral Ligament Suspension with TVT/Cystoscopy.  Surgery and risks reviewed thoroughly including trauma, infection, DVT/PE, Hemorrhage, Anesthesia Cx.  Atrial Fibrillation, off Coumadin for Surgery.  INR 1.4 yesterday.  Cardio Clearance obtained.  Chronic back pain, Arthritis, previous Vertebroplasty.  Samantha Foley,Samantha Foley 03/07/2016, 11:57 PM

## 2016-03-08 ENCOUNTER — Ambulatory Visit (HOSPITAL_COMMUNITY): Payer: PPO | Admitting: Anesthesiology

## 2016-03-08 ENCOUNTER — Encounter (HOSPITAL_COMMUNITY): Payer: Self-pay

## 2016-03-08 ENCOUNTER — Ambulatory Visit (HOSPITAL_COMMUNITY)
Admission: RE | Admit: 2016-03-08 | Discharge: 2016-03-09 | Disposition: A | Discharge status: Home / Self Care | Payer: PPO | Source: Ambulatory Visit | Attending: Obstetrics & Gynecology | Admitting: Obstetrics & Gynecology

## 2016-03-08 ENCOUNTER — Encounter (HOSPITAL_COMMUNITY)
Admission: RE | Disposition: A | Discharge status: Home / Self Care | Payer: Self-pay | Source: Ambulatory Visit | Attending: Obstetrics & Gynecology

## 2016-03-08 DIAGNOSIS — Z7901 Long term (current) use of anticoagulants: Secondary | ICD-10-CM | POA: Diagnosis not present

## 2016-03-08 DIAGNOSIS — K219 Gastro-esophageal reflux disease without esophagitis: Secondary | ICD-10-CM | POA: Insufficient documentation

## 2016-03-08 DIAGNOSIS — D259 Leiomyoma of uterus, unspecified: Secondary | ICD-10-CM | POA: Diagnosis not present

## 2016-03-08 DIAGNOSIS — M549 Dorsalgia, unspecified: Secondary | ICD-10-CM | POA: Diagnosis not present

## 2016-03-08 DIAGNOSIS — I4891 Unspecified atrial fibrillation: Secondary | ICD-10-CM | POA: Insufficient documentation

## 2016-03-08 DIAGNOSIS — D252 Subserosal leiomyoma of uterus: Secondary | ICD-10-CM | POA: Insufficient documentation

## 2016-03-08 DIAGNOSIS — D251 Intramural leiomyoma of uterus: Secondary | ICD-10-CM | POA: Diagnosis not present

## 2016-03-08 DIAGNOSIS — N9489 Other specified conditions associated with female genital organs and menstrual cycle: Secondary | ICD-10-CM | POA: Diagnosis not present

## 2016-03-08 DIAGNOSIS — N393 Stress incontinence (female) (male): Secondary | ICD-10-CM | POA: Diagnosis not present

## 2016-03-08 DIAGNOSIS — Z90710 Acquired absence of both cervix and uterus: Secondary | ICD-10-CM | POA: Diagnosis not present

## 2016-03-08 DIAGNOSIS — N814 Uterovaginal prolapse, unspecified: Secondary | ICD-10-CM | POA: Diagnosis not present

## 2016-03-08 DIAGNOSIS — N8111 Cystocele, midline: Secondary | ICD-10-CM | POA: Diagnosis not present

## 2016-03-08 DIAGNOSIS — Z9889 Other specified postprocedural states: Secondary | ICD-10-CM

## 2016-03-08 HISTORY — PX: ROBOTIC ASSISTED TOTAL HYSTERECTOMY WITH BILATERAL SALPINGO OOPHERECTOMY: SHX6086

## 2016-03-08 SURGERY — ROBOTIC ASSISTED TOTAL HYSTERECTOMY WITH BILATERAL SALPINGO OOPHORECTOMY
Anesthesia: General | Site: Abdomen | Laterality: Bilateral

## 2016-03-08 MED ORDER — ACETAMINOPHEN 325 MG PO TABS: 325.0000 mg | ORAL_TABLET | ORAL | Status: DC | PRN

## 2016-03-08 MED ORDER — DEXAMETHASONE SODIUM PHOSPHATE 4 MG/ML IJ SOLN
INTRAMUSCULAR | Status: AC
  Filled 2016-03-08: qty 1

## 2016-03-08 MED ORDER — PROPOFOL 10 MG/ML IV BOLUS
INTRAVENOUS | Status: DC | PRN
  Administered 2016-03-08: 150 mg via INTRAVENOUS

## 2016-03-08 MED ORDER — BUPIVACAINE HCL (PF) 0.25 % IJ SOLN
INTRAMUSCULAR | Status: DC | PRN
  Administered 2016-03-08: 28 mL

## 2016-03-08 MED ORDER — ROPIVACAINE HCL 5 MG/ML IJ SOLN
INTRAMUSCULAR | Status: AC
  Filled 2016-03-08: qty 30

## 2016-03-08 MED ORDER — ONDANSETRON HCL 4 MG/2ML IJ SOLN
INTRAMUSCULAR | Status: DC | PRN
  Administered 2016-03-08: 4 mg via INTRAVENOUS

## 2016-03-08 MED ORDER — HYDROMORPHONE HCL 1 MG/ML IJ SOLN: 1.0000 mg | INTRAMUSCULAR | Status: DC | PRN

## 2016-03-08 MED ORDER — DEXAMETHASONE SODIUM PHOSPHATE 10 MG/ML IJ SOLN
INTRAMUSCULAR | Status: DC | PRN
  Administered 2016-03-08: 4 mg via INTRAVENOUS

## 2016-03-08 MED ORDER — EPHEDRINE SULFATE 50 MG/ML IJ SOLN
INTRAMUSCULAR | Status: DC | PRN
  Administered 2016-03-08 (×4): 10 mg via INTRAVENOUS

## 2016-03-08 MED ORDER — PHENYLEPHRINE HCL 10 MG/ML IJ SOLN
INTRAMUSCULAR | Status: DC | PRN
  Administered 2016-03-08: 40 ug via INTRAVENOUS

## 2016-03-08 MED ORDER — SUGAMMADEX SODIUM 200 MG/2ML IV SOLN
INTRAVENOUS | Status: DC | PRN
  Administered 2016-03-08: 145.2 mg via INTRAVENOUS

## 2016-03-08 MED ORDER — TRAMADOL HCL 50 MG PO TABS
50.0000 mg | ORAL_TABLET | ORAL | Status: DC | PRN
  Filled 2016-03-08: qty 1

## 2016-03-08 MED ORDER — OXYCODONE HCL 5 MG/5ML PO SOLN: 5.0000 mg | Freq: Once | ORAL | Status: DC | PRN

## 2016-03-08 MED ORDER — ONDANSETRON HCL 4 MG/2ML IJ SOLN: 4.0000 mg | Freq: Once | INTRAMUSCULAR | Status: DC | PRN

## 2016-03-08 MED ORDER — LACTATED RINGERS IV SOLN
INTRAVENOUS | Status: DC
  Administered 2016-03-08: 125 mL/h via INTRAVENOUS
  Administered 2016-03-08: 10:00:00 via INTRAVENOUS

## 2016-03-08 MED ORDER — ROCURONIUM BROMIDE 100 MG/10ML IV SOLN
INTRAVENOUS | Status: DC | PRN
  Administered 2016-03-08: 30 mg via INTRAVENOUS
  Administered 2016-03-08: 10 mg via INTRAVENOUS

## 2016-03-08 MED ORDER — ACETAMINOPHEN 325 MG PO TABS
325.0000 mg | ORAL_TABLET | ORAL | Status: DC | PRN
  Administered 2016-03-08: 325 mg via ORAL
  Filled 2016-03-08: qty 1

## 2016-03-08 MED ORDER — ROPIVACAINE HCL 5 MG/ML IJ SOLN
INTRAMUSCULAR | Status: DC | PRN
  Administered 2016-03-08: 60 mL

## 2016-03-08 MED ORDER — SODIUM CHLORIDE 0.9 % IJ SOLN
INTRAMUSCULAR | Status: AC
  Filled 2016-03-08: qty 50

## 2016-03-08 MED ORDER — OXYCODONE HCL 5 MG PO TABS: 5.0000 mg | ORAL_TABLET | Freq: Once | ORAL | Status: DC | PRN

## 2016-03-08 MED ORDER — ONDANSETRON HCL 4 MG/2ML IJ SOLN
INTRAMUSCULAR | Status: AC
  Filled 2016-03-08: qty 2

## 2016-03-08 MED ORDER — LACTATED RINGERS IV SOLN
INTRAVENOUS | Status: DC
  Administered 2016-03-08 (×2): via INTRAVENOUS

## 2016-03-08 MED ORDER — SCOPOLAMINE 1 MG/3DAYS TD PT72: 1.0000 | MEDICATED_PATCH | Freq: Once | TRANSDERMAL | Status: DC

## 2016-03-08 MED ORDER — PANTOPRAZOLE SODIUM 40 MG PO TBEC
DELAYED_RELEASE_TABLET | ORAL | Status: AC
  Administered 2016-03-08: 40 mg via ORAL
  Filled 2016-03-08: qty 1

## 2016-03-08 MED ORDER — ROCURONIUM BROMIDE 100 MG/10ML IV SOLN
INTRAVENOUS | Status: AC
Start: 2016-03-08 — End: 2016-03-08
  Filled 2016-03-08: qty 1

## 2016-03-08 MED ORDER — OXYCODONE-ACETAMINOPHEN 5-325 MG PO TABS
1.0000 | ORAL_TABLET | ORAL | Status: DC | PRN
  Filled 2016-03-08: qty 1

## 2016-03-08 MED ORDER — PANTOPRAZOLE SODIUM 40 MG PO TBEC
DELAYED_RELEASE_TABLET | ORAL | Status: AC
  Filled 2016-03-08: qty 1

## 2016-03-08 MED ORDER — PANTOPRAZOLE SODIUM 40 MG PO TBEC
40.0000 mg | DELAYED_RELEASE_TABLET | Freq: Every day | ORAL | Status: DC
  Administered 2016-03-08: 40 mg via ORAL

## 2016-03-08 MED ORDER — LIDOCAINE HCL (CARDIAC) 20 MG/ML IV SOLN
INTRAVENOUS | Status: DC | PRN
  Administered 2016-03-08: 100 mg via INTRAVENOUS

## 2016-03-08 MED ORDER — LIDOCAINE HCL (CARDIAC) 20 MG/ML IV SOLN
INTRAVENOUS | Status: AC
Start: 2016-03-08 — End: 2016-03-08
  Filled 2016-03-08: qty 5

## 2016-03-08 MED ORDER — FENTANYL CITRATE (PF) 100 MCG/2ML IJ SOLN
INTRAMUSCULAR | Status: AC
  Filled 2016-03-08: qty 2

## 2016-03-08 MED ORDER — KETOROLAC TROMETHAMINE 30 MG/ML IJ SOLN: 30.0000 mg | Freq: Once | INTRAMUSCULAR | Status: DC

## 2016-03-08 MED ORDER — PROPOFOL 10 MG/ML IV BOLUS
INTRAVENOUS | Status: AC
  Filled 2016-03-08: qty 20

## 2016-03-08 MED ORDER — BUPIVACAINE HCL (PF) 0.25 % IJ SOLN
INTRAMUSCULAR | Status: AC
  Filled 2016-03-08: qty 30

## 2016-03-08 MED ORDER — ACETAMINOPHEN 160 MG/5ML PO SOLN
325.0000 mg | ORAL | Status: DC | PRN
Start: 2016-03-08 — End: 2016-03-08

## 2016-03-08 MED ORDER — TRAMADOL HCL 50 MG PO TABS: 50.0000 mg | ORAL_TABLET | ORAL | Status: DC | PRN

## 2016-03-08 MED ORDER — FENTANYL CITRATE (PF) 250 MCG/5ML IJ SOLN
INTRAMUSCULAR | Status: AC
  Filled 2016-03-08: qty 5

## 2016-03-08 MED ORDER — IBUPROFEN 600 MG PO TABS
600.0000 mg | ORAL_TABLET | Freq: Four times a day (QID) | ORAL | Status: DC | PRN
  Administered 2016-03-08: 600 mg via ORAL
  Filled 2016-03-08: qty 1

## 2016-03-08 MED ORDER — TRAMADOL-ACETAMINOPHEN 37.5-325 MG PO TABS: 1.0000 | ORAL_TABLET | ORAL | Status: DC | PRN

## 2016-03-08 MED ORDER — HYDROMORPHONE HCL 1 MG/ML IJ SOLN: 0.2500 mg | INTRAMUSCULAR | Status: DC | PRN

## 2016-03-08 MED ORDER — FENTANYL CITRATE (PF) 100 MCG/2ML IJ SOLN
INTRAMUSCULAR | Status: DC | PRN
  Administered 2016-03-08 (×7): 50 ug via INTRAVENOUS

## 2016-03-08 SURGICAL SUPPLY — 57 items
BARRIER ADHS 3X4 INTERCEED (GAUZE/BANDAGES/DRESSINGS) ×3 IMPLANT
BRR ADH 4X3 ABS CNTRL BYND (GAUZE/BANDAGES/DRESSINGS) ×1
CATH FOLEY 3WAY  5CC 16FR (CATHETERS) ×2
CATH FOLEY 3WAY 5CC 16FR (CATHETERS) ×1 IMPLANT
CLOTH BEACON ORANGE TIMEOUT ST (SAFETY) ×3 IMPLANT
CONT PATH 16OZ SNAP LID 3702 (MISCELLANEOUS) ×3 IMPLANT
COVER BACK TABLE 60X90IN (DRAPES) ×6 IMPLANT
COVER TIP SHEARS 8 DVNC (MISCELLANEOUS) ×1 IMPLANT
COVER TIP SHEARS 8MM DA VINCI (MISCELLANEOUS) ×2
DECANTER SPIKE VIAL GLASS SM (MISCELLANEOUS) ×6 IMPLANT
DEFOGGER SCOPE WARMER CLEARIFY (MISCELLANEOUS) ×3 IMPLANT
DRSG OPSITE POSTOP 3X4 (GAUZE/BANDAGES/DRESSINGS) ×3 IMPLANT
DRSG TEGADERM 1.75X1.75 (GAUZE/BANDAGES/DRESSINGS) ×2 IMPLANT
DURAPREP 26ML APPLICATOR (WOUND CARE) ×3 IMPLANT
ELECT REM PT RETURN 9FT ADLT (ELECTROSURGICAL) ×3
ELECTRODE REM PT RTRN 9FT ADLT (ELECTROSURGICAL) ×1 IMPLANT
GAUZE VASELINE 3X9 (GAUZE/BANDAGES/DRESSINGS) IMPLANT
GLOVE BIO SURGEON STRL SZ 6.5 (GLOVE) ×6 IMPLANT
GLOVE BIO SURGEONS STRL SZ 6.5 (GLOVE) ×3
GLOVE BIOGEL PI IND STRL 7.0 (GLOVE) ×6 IMPLANT
GLOVE BIOGEL PI INDICATOR 7.0 (GLOVE) ×12
IV STOPCOCK 4 WAY 40  W/Y SET (IV SOLUTION) ×2
IV STOPCOCK 4 WAY 40 W/Y SET (IV SOLUTION) ×1 IMPLANT
KIT ACCESSORY DA VINCI DISP (KITS) ×2
KIT ACCESSORY DVNC DISP (KITS) ×1 IMPLANT
LEGGING LITHOTOMY PAIR STRL (DRAPES) ×3 IMPLANT
LIQUID BAND (GAUZE/BANDAGES/DRESSINGS) ×6 IMPLANT
NEEDLE HYPO 22GX1.5 SAFETY (NEEDLE) IMPLANT
OCCLUDER COLPOPNEUMO (BALLOONS) IMPLANT
PACK ROBOT WH (CUSTOM PROCEDURE TRAY) ×3 IMPLANT
PACK ROBOTIC GOWN (GOWN DISPOSABLE) ×3 IMPLANT
PAD PREP 24X48 CUFFED NSTRL (MISCELLANEOUS) ×6 IMPLANT
PAD TRENDELENBURG POSITION (MISCELLANEOUS) ×3 IMPLANT
SET CYSTO W/LG BORE CLAMP LF (SET/KITS/TRAYS/PACK) IMPLANT
SET IRRIG TUBING LAPAROSCOPIC (IRRIGATION / IRRIGATOR) ×6 IMPLANT
SET TRI-LUMEN FLTR TB AIRSEAL (TUBING) IMPLANT
SUT ETHIBOND 0 (SUTURE) ×10 IMPLANT
SUT VIC AB 0 CT2 27 (SUTURE) IMPLANT
SUT VIC AB 4-0 PS2 27 (SUTURE) ×6 IMPLANT
SUT VICRYL 0 UR6 27IN ABS (SUTURE) ×6 IMPLANT
SUT VLOC 180 0 9IN  GS21 (SUTURE) ×2
SUT VLOC 180 0 9IN GS21 (SUTURE) IMPLANT
SUT VLOC 180 2-0 6IN GS21 (SUTURE) ×4 IMPLANT
SYR 50ML LL SCALE MARK (SYRINGE) ×3 IMPLANT
SYSTEM CONVERTIBLE TROCAR (TROCAR) IMPLANT
TIP RUMI ORANGE 6.7MMX12CM (TIP) IMPLANT
TIP UTERINE 5.1X6CM LAV DISP (MISCELLANEOUS) IMPLANT
TIP UTERINE 6.7X10CM GRN DISP (MISCELLANEOUS) IMPLANT
TIP UTERINE 6.7X6CM WHT DISP (MISCELLANEOUS) IMPLANT
TIP UTERINE 6.7X8CM BLUE DISP (MISCELLANEOUS) ×2 IMPLANT
TOWEL OR 17X24 6PK STRL BLUE (TOWEL DISPOSABLE) ×9 IMPLANT
TROCAR 12M 150ML BLUNT (TROCAR) IMPLANT
TROCAR DISP BLADELESS 8 DVNC (TROCAR) ×1 IMPLANT
TROCAR DISP BLADELESS 8MM (TROCAR) ×2
TROCAR PORT AIRSEAL 5X120 (TROCAR) IMPLANT
TROCAR XCEL 12X100 BLDLESS (ENDOMECHANICALS) ×3 IMPLANT
WATER STERILE IRR 1000ML POUR (IV SOLUTION) ×9 IMPLANT

## 2016-03-08 NOTE — Op Note (Signed)
03/08/2016  10:39 AM  PATIENT:  Samantha Foley  82 y.o. female  PRE-OPERATIVE DIAGNOSIS:  Uterine Prolapse and Cystocele POST-OPERATIVE DIAGNOSIS:  Uterine Prolapse and Cystocele  PROCEDURE:  Procedure(s): ROBOTIC ASSISTED TOTAL HYSTERECTOMY WITH BILATERAL SALPINGO OOPHORECTOMY/Uterosacral Ligament Suspension  SURGEON:  Surgeon(s): Princess Bruins, MD  ASSISTANTS: Azucena Fallen MD   ANESTHESIA:   general   PROCEDURE:  Under general anesthesia with endotracheal intubation the patient is an lithotomy position. Because of her back pain and previous vertebroplasty, the position of the patient on the surgical table was tested while the patient was awake. She is prepped with ChloraPrep on the abdomen and with Betadine on the suprapubic, vulvar and vaginal areas.  We see the cervix outside the vulva, patient has a complete uterine prolapse.  A #8 Rumi with a small Koh ring are put in place in the uterus.  The Foley is inserted in the bladder.  Abdomen elderly, we infiltrate the supraumbilical area with Marcaine one quarter plane. We make a 1.5 cm incision at that level. We grasped the aponeurosis with Kocur's and opened the aponeurosis with Mayo scissors under direct vision. The parietal peritoneum is opened bluntly with a finger. A pursestring stitch of Vicryl 0 is done on the aponeurosis.  The Sheryle Hail is inserted at that level and up pneumoperitoneum is created with CO2. The camera is inserted. The inspection reveals a clear anterior abdominal wall for port placement. The uterus presents small fibroids, both tubes are normal and both ovaries are normal.  A semicircular configuration was used for port placement with 2 robotic ports on the right side and one robotic port on the lower left.  The assistant port was put on the upper left.  All ports were inserted under direct vision after infiltrating Marcaine one quarter plain and making small incisions with the scalpel.  The patient was then placed in  Trendelenburg as needed for a good vision but not in maximum Trendelenburg.  She tolerated it well.  The robot was docked from the right side.  Robotic instruments were inserted under direct vision with the EndoShears scissor in the first arm, the PK in the second arm and the prograsp in the third arm.  We then went to the console.      Both ureters were visualized and normal anatomy position with good peristalsis.  Very filmy adhesions were removed between the sigmoid an left abdominal wall.  We then cauterized in section done left round ligament. We cauterized and sectioned the left infundibulopelvic ligament. We followed the lower border of the ovary to reach the left lateral side of the uterus.  We opened the anterior peritoneum and started descending the bladder.  We proceeded exactly the same way on the right side.  The bladder was easily descended past the [facility] ring.  We then cauterized and sectioned and the right uterine artery.  We cauterized and sectioned the left uterine artery.  The vaginal occluder was filled up and the upper aspect of the vagina was opened with the tip of the Endo Shears scissors on the [employer].  The uterus was completely detached with both tubes and both ovaries and cervix and passed vaginally. It was sent to pathology.  Hemostasis was completed at the top of the vagina with the PK as needed.  We then switched the instruments to the cutting needle driver in the first arm and the long tip in the second arm, with the PK in the third arm.  The vaginal vault  was closed with a V lock 0 at 9 inches in a running suture.  We started from the right angle to the left and then back to the midline.  The suture was kept long enough to be able to pull it up to see the uterosacral ligaments better.  The uterosacral ligaments were well seen bilaterally and were far from the ureters bilaterally.  We used a V Lock 2-0 to suspend the right uterosacral ligament to the vaginal vault.  We proceeded the  same way with the left uterosacral ligament.  We then added an Ethibond 2-0 permanent suture to secure the uterosacral suspension bilaterally.  The vaginal vault was very well suspended.  Both ureters were seen in normal anatomic position with good peristalsis after that intervention.  Hemostasis was adequate at all levels.  All needles were removed from the abdominopelvic cavity through the left robotic port.  The robotic instruments were removed. The robot was undocked.  We went to laparoscopy time.  We irrigated and suctioned the abdominal pelvic cavities.  We reconfirmed good hemostasis at all levels. Bupivicaine was poured in the abdominopelvic cavity. Laparoscopy instruments were removed.  All ports were removed under direct vision. The CO2 was evacuated. The supraumbilical incision was closed first by attaching the pursestring stitch at the aponeurosis. All incisions were closed with a subcuticular suture of Monocryl 3-0.  Dermabond was added on all incisions.  Hemostasis was adequate on all incisions.  The vaginal occluder was removed during robotic time. The good suspension of the vaginal vault was confirmed with a vaginal exam.  The patient was brought to recovery room in good and stable status.  ESTIMATED BLOOD LOSS:  25 cc   Intake/Output Summary (Last 24 hours) at 03/08/16 1039 Last data filed at 03/08/16 1032  Gross per 24 hour  Intake             1500 ml  Output              325 ml  Net             1175 ml     BLOOD ADMINISTERED:none   LOCAL MEDICATIONS USED:  MARCAINE    and BUPIVICAINE   SPECIMEN:  Source of Specimen:  Uterus with both tubes, both ovaries and cervix  DISPOSITION OF SPECIMEN:  PATHOLOGY  COUNTS:  YES  PLAN OF CARE: Transfer to PACU  Princess Bruins MD  03/08/2016 at 10:40 am

## 2016-03-08 NOTE — Anesthesia Postprocedure Evaluation (Signed)
Anesthesia Post Note  Patient: Samantha Foley  Procedure(s) Performed: Procedure(s) (LRB): ROBOTIC ASSISTED TOTAL HYSTERECTOMY WITH BILATERAL SALPINGO OOPHORECTOMY/Uterosacral Ligament Suspension (Bilateral)  Patient location during evaluation: PACU Anesthesia Type: General Level of consciousness: sedated Pain management: pain level controlled Vital Signs Assessment: post-procedure vital signs reviewed and stable Respiratory status: spontaneous breathing Cardiovascular status: stable Postop Assessment: no signs of nausea or vomiting Anesthetic complications: no    Last Vitals:  Vitals:   03/08/16 1033 03/08/16 1100  BP: (!) 162/87 (!) 152/74  Pulse: 90 93  Resp: 12 20  Temp: 36.4 C     Last Pain:  Vitals:   03/08/16 1100  TempSrc:   PainSc: 0-No pain                 Evamaria Detore JR,JOHN Aryiah Monterosso

## 2016-03-08 NOTE — Anesthesia Procedure Notes (Signed)
Procedure Name: Intubation Date/Time: 03/08/2016 7:45 AM Performed by: Hewitt Blade Pre-anesthesia Checklist: Patient identified, Emergency Drugs available, Suction available and Patient being monitored Patient Re-evaluated:Patient Re-evaluated prior to inductionOxygen Delivery Method: Circle system utilized Preoxygenation: Pre-oxygenation with 100% oxygen Intubation Type: IV induction Ventilation: Mask ventilation without difficulty Laryngoscope Size: Mac and 3 Grade View: Grade II Tube type: Oral Tube size: 7.0 mm Number of attempts: 1 Airway Equipment and Method: Stylet Placement Confirmation: ETT inserted through vocal cords under direct vision,  positive ETCO2 and breath sounds checked- equal and bilateral Secured at: 22 cm Tube secured with: Tape Dental Injury: Teeth and Oropharynx as per pre-operative assessment

## 2016-03-08 NOTE — Anesthesia Postprocedure Evaluation (Signed)
Anesthesia Post Note  Patient: Samantha Foley  Procedure(s) Performed: Procedure(s) (LRB): ROBOTIC ASSISTED TOTAL HYSTERECTOMY WITH BILATERAL SALPINGO OOPHORECTOMY/Uterosacral Ligament Suspension (Bilateral)  Patient location during evaluation: [facility] Anesthesia Type: General Level of consciousness: awake and alert Pain management: satisfactory to patient Vital Signs Assessment: post-procedure vital signs reviewed and stable Respiratory status: spontaneous breathing and respiratory function stable Cardiovascular status: stable Postop Assessment: adequate PO intake Anesthetic complications: no     Last Vitals:  Vitals:   03/08/16 1310 03/08/16 1626  BP: (!) 127/58 (!) 118/58  Pulse: 77 82  Resp:    Temp: 36.4 C (!) 35.6 C    Last Pain:  Vitals:   03/08/16 1652  TempSrc:   PainSc: 4    Pain Goal: Patients Stated Pain Goal: 4 (03/08/16 1652)               Doraine Schexnider

## 2016-03-08 NOTE — Transfer of Care (Signed)
Immediate Anesthesia Transfer of Care Note  Patient: Samantha Foley  Procedure(s) Performed: Procedure(s): ROBOTIC ASSISTED TOTAL HYSTERECTOMY WITH BILATERAL SALPINGO OOPHORECTOMY/Uterosacral Ligament Suspension (Bilateral)  Patient Location: PACU  Anesthesia Type:General  Level of Consciousness: awake and sedated  Airway & Oxygen Therapy: Patient Spontanous Breathing and Patient connected to nasal cannula oxygen  Post-op Assessment: Report given to RN, Post -op Vital signs reviewed and stable and Patient moving all extremities  Post vital signs: Reviewed and stable  Last Vitals:  Vitals:   03/08/16 0627  BP: 133/75  Pulse: 88  Resp: 16  Temp: 36.3 C    Last Pain:  Vitals:   03/08/16 0627  TempSrc: Oral      Patients Stated Pain Goal: 4 ([id number])  Complications: No apparent anesthesia complications

## 2016-03-09 ENCOUNTER — Telehealth: Payer: Self-pay | Admitting: Cardiology

## 2016-03-09 DIAGNOSIS — Z90710 Acquired absence of both cervix and uterus: Secondary | ICD-10-CM | POA: Diagnosis not present

## 2016-03-09 DIAGNOSIS — N814 Uterovaginal prolapse, unspecified: Secondary | ICD-10-CM | POA: Diagnosis not present

## 2016-03-09 LAB — CBC
HCT: 35 % — ABNORMAL LOW (ref 36.0–46.0)
Hemoglobin: 11.3 g/dL — ABNORMAL LOW (ref 12.0–15.0)
MCH: 28.5 pg (ref 26.0–34.0)
MCHC: 32.3 g/dL (ref 30.0–36.0)
MCV: 88.2 fL (ref 78.0–100.0)
PLATELETS: 175 10*3/uL (ref 150–400)
RBC: 3.97 MIL/uL (ref 3.87–5.11)
RDW: 14.8 % (ref 11.5–15.5)
WBC: 8.6 10*3/uL (ref 4.0–10.5)

## 2016-03-09 MED ORDER — TRAMADOL-ACETAMINOPHEN 37.5-325 MG PO TABS: 1.0000 | ORAL_TABLET | Freq: Four times a day (QID) | ORAL | 0 refills | Status: DC | PRN

## 2016-03-09 NOTE — Progress Notes (Addendum)
Pt c/o SOB after a long walk. Pt states that she was walking fast and feels that she may have just gone too fast and too far. O2 was reapplied and pt was placed on continuous O2/pulse rate monitor. Pt then called out and said that she felt like she had gone back into afib. I auscultated her heart rhythm and it sounded mostly regular with an occasional beat of irregularity. I notified Dr. Dellis Filbert at this time and she asked for pt to be placed on Tele and full set of VS assessed. VS assessed. Spoke with 3rd floor leadership. Rather than moving pt to AICU, Telemetry monitor was brought to patients room by myself and Guilford Shi, RN, AD. Pt stated that she felt like she had already converted out of Afib. Monitor shows sinus rhythm with extra beats (appears to be PACs). Pt states that she feels much better at this time. She is sitting up talking/joking with me and her daughters. Dr. Dellis Filbert called back at 1100 to check on her and asked me to follow up with pts cardiologist. Will follow up w/cardio. Marry Guan

## 2016-03-09 NOTE — Discharge Summary (Signed)
Physician Discharge Summary  Patient ID: Samantha Foley MRN: [id number] DOB/AGE: [birth year] 80 y.o.  Admit date: 03/08/2016 Discharge date: 03/09/2016  Admission Diagnoses: Uterine Prolapse  Discharge Diagnoses: Uterine Prolapse        Principal Problem:   S/P total hysterectomy - Davinci Active Problems:   Postoperative state   Discharged Condition: good  Hospital Course:  Good  Consults: None  Treatments: surgery: Robotic Total Laparoscopic Hysterectomy wih Bilateral Salpingo-Oophorectomy, Bilateral Uterosacral Ligament Suspension and Lysis of Adhesions.  Disposition: 01-Home or Self Care     Medication List    STOP taking these medications   estradiol 0.1 MG/GM vaginal cream Commonly known as:  ESTRACE     TAKE these medications   omeprazole 20 MG capsule Commonly known as:  PRILOSEC Take 20 mg by mouth daily.   traMADol-acetaminophen 37.5-325 MG tablet Commonly known as:  ULTRACET Take 1 tablet by mouth every 6 (six) hours as needed.   Vitamin D3 1000 units Caps Take 1 capsule by mouth daily.   warfarin 1 MG tablet Commonly known as:  COUMADIN Take 1-1.5 mg by mouth daily. Patient takes 1.5 mg on Monday, Wednesday and Friday and takes 1 mg on Tuesday,Thursday ,Sat and Sun What changed:  Another medication with the same name was removed. Continue taking this medication, and follow the directions you see here.      Follow-up Information    Korrie Hofbauer,[name], MD Follow up in 3 week(s).   Specialty:  Obstetrics and Gynecology Contact information: [place] [address] [phone]           Signed: Princess Bruins, MD 03/09/2016, 1:44 PM

## 2016-03-09 NOTE — Discharge Instructions (Signed)
Total Laparoscopic Hysterectomy, Care After °Refer to this sheet in the next few weeks. These instructions provide you with information on caring for yourself after your procedure. Your health care provider may also give you more specific instructions. Your treatment has been planned according to current medical practices, but problems sometimes occur. Call your health care provider if you have any problems or questions after your procedure. °WHAT TO EXPECT AFTER THE PROCEDURE °· Pain and bruising at the incision sites. You will be given pain medicine to control it. °· Menopausal symptoms such as hot flashes, night sweats, and insomnia if your ovaries were removed. °· Sore throat from the breathing tube that was inserted during surgery. °HOME CARE INSTRUCTIONS °· Only take over-the-counter or prescription medicines for pain, discomfort, or fever as directed by your health care provider.   °· Do not take aspirin. It can cause bleeding.   °· Do not drive when taking pain medicine.   °· Follow your health care provider's advice regarding diet, exercise, lifting, driving, and general activities.   °· Resume your usual diet as directed and allowed.   °· Get plenty of rest and sleep.   °· Do not douche, use tampons, or have sexual intercourse for at least 6 weeks, or until your health care provider gives you permission.   °· Change your bandages (dressings) as directed by your health care provider.   °· Monitor your temperature and notify your health care provider of a fever.   °· Take showers instead of baths for 2-3 weeks.   °· Do not drink alcohol until your health care provider gives you permission.   °· If you develop constipation, you may take a mild laxative with your health care provider's permission. Bran foods may help with constipation problems. Drinking enough fluids to keep your urine clear or pale yellow may help as well.   °· Try to have someone home with you for 1-2 weeks to help around the house.    °· Keep all of your follow-up appointments as directed by your health care provider.   °SEEK MEDICAL CARE IF: °· You have swelling, redness, or increasing pain around your incision sites.   °· You have pus coming from your incision.   °· You notice a bad smell coming from your incision.   °· Your incision breaks open.   °· You feel dizzy or lightheaded.   °· You have pain or bleeding when you urinate.   °· You have persistent diarrhea.   °· You have persistent nausea and vomiting.   °· You have abnormal vaginal discharge.   °· You have a rash.   °· You have any type of abnormal reaction or develop an allergy to your medicine.   °· You have poor pain control with your prescribed medicine.   °SEEK IMMEDIATE MEDICAL CARE IF: °· You have chest pain or shortness of breath. °· You have severe abdominal pain that is not relieved with pain medicine. °· You have pain or swelling in your legs. °MAKE SURE YOU: °· Understand these instructions. °· Will watch your condition. °· Will get help right away if you are not doing well or get worse. °  °This information is not intended to replace advice given to you by your health care provider. Make sure you discuss any questions you have with your health care provider. °  °Document Released: 04/01/2013 Document Revised: 06/16/2013 Document Reviewed: 04/01/2013 °Elsevier Interactive Patient Education ©2016 [employer] ° °

## 2016-03-09 NOTE — Telephone Encounter (Signed)
Called from [facility] hospital in regards to restarting coumadin post TAH.  Patient has no history of CVA and was determined by coumadin clinic prior to surgery not to require bridging.  Instructed nurse that when GYN feels safe to restart coumadin then can restart.  SHe has an appt in coumadin clinic on 9/21.  She did have a brief episode of PAF that only lasted a few minutes and now in NSR.  She has not had any other episodes of breakthrough.  We will get her a followup in afib clinic in next 1-2 weeks.

## 2016-03-09 NOTE — Plan of Care (Signed)
Problem: Tissue Perfusion: Goal: Risk factors for ineffective tissue perfusion will decrease Outcome: Completed/Met Date Met: 03/09/16 Pt continues to wear SCDs appropriately and is ambulating frequently.   Problem: Fluid Volume: Goal: Ability to maintain a balanced intake and output will improve Outcome: Progressing Pt ambulating very well. Marry Guan    Problem: Bowel/Gastric: Goal: Will not experience complications related to bowel motility Outcome: Progressing Positive bowel sounds and passing gas. Marry Guan

## 2016-03-09 NOTE — Progress Notes (Addendum)
1 Day Post-Op Procedure(s) (LRB): ROBOTIC ASSISTED TOTAL HYSTERECTOMY WITH BILATERAL SALPINGO OOPHORECTOMY/Uterosacral Ligament Suspension (Bilateral)  Subjective: Patient reports that pain is well managed.  Had an episode of AFib this am which resolved rapidly. Tolerating normal diet as tolerated  diet without difficulty. No nausea / vomiting.  Ambulating and voiding.  Objective: BP 105/65 (BP Location: Right Arm)   Pulse 64   Temp 98.5 F (36.9 C) (Oral)   Resp 18   Ht 5\' 2"  (1.575 m)   Wt 160 lb (72.6 kg)   SpO2 100%   BMI 29.26 kg/m  Lungs: clear Heart: normal rate and rhythm Abdomen:soft and appropriately tender Extremities: Homans sign is negative, no sign of DVT Incision: healing well  Telemetry:  Normal sinus rhythm  Results for ZINDA, VAZIRI (MRN [id number]) as of 03/09/2016 13:40  Ref. Range 03/09/2016 08:10  WBC Latest Ref Range: 4.0 - 10.5 K/uL 8.6  RBC Latest Ref Range: 3.87 - 5.11 MIL/uL 3.97  Hemoglobin Latest Ref Range: 12.0 - 15.0 g/dL 11.3 (L)  HCT Latest Ref Range: 36.0 - 46.0 % 35.0 (L)  MCV Latest Ref Range: 78.0 - 100.0 fL 88.2  MCH Latest Ref Range: 26.0 - 34.0 pg 28.5  MCHC Latest Ref Range: 30.0 - 36.0 g/dL 32.3  RDW Latest Ref Range: 11.5 - 15.5 % 14.8  Platelets Latest Ref Range: 150 - 400 K/uL 175    Assessment: s/p Procedure(s): ROBOTIC ASSISTED TOTAL HYSTERECTOMY WITH BILATERAL SALPINGO OOPHORECTOMY/Uterosacral Ligament Suspension: progressing well  AFib. Plan: Advance diet Encourage ambulation Discharge home  LOS: 0 days   F/U Cardio and Coumadin clinic next wk.  Resume Coumadin same dose as before surgery, 1 to 1.5 mg qd.   Sonita Michiels,[name] 03/09/2016, 7:38 AM

## 2016-03-09 NOTE — Progress Notes (Signed)
Pt has no questions at this time. Pt and her 2 daughters verbalize understanding of d/c instructions, medications, follow up appts, when to seek medical attention, and belongings policy. Pt was given a copy of d/c instructions, Recovering From Surgery booklet, and her prescription. Pt escorted to main entrance via wheelchair accompanied by NT with her family. Marry Guan

## 2016-03-11 ENCOUNTER — Encounter (HOSPITAL_COMMUNITY): Payer: Self-pay | Admitting: Obstetrics & Gynecology

## 2016-03-21 ENCOUNTER — Inpatient Hospital Stay (HOSPITAL_COMMUNITY): Admission: RE | Admit: 2016-03-21 | Payer: PPO | Source: Ambulatory Visit | Admitting: Nurse Practitioner

## 2016-03-22 ENCOUNTER — Ambulatory Visit (INDEPENDENT_AMBULATORY_CARE_PROVIDER_SITE_OTHER): Payer: PPO | Admitting: *Deleted

## 2016-03-22 DIAGNOSIS — I480 Paroxysmal atrial fibrillation: Secondary | ICD-10-CM

## 2016-03-22 DIAGNOSIS — I4891 Unspecified atrial fibrillation: Secondary | ICD-10-CM

## 2016-03-22 LAB — POCT INR: INR: 1.7

## 2016-03-30 DIAGNOSIS — D559 Anemia due to enzyme disorder, unspecified: Secondary | ICD-10-CM | POA: Diagnosis not present

## 2016-03-30 DIAGNOSIS — R5383 Other fatigue: Secondary | ICD-10-CM | POA: Diagnosis not present

## 2016-04-05 ENCOUNTER — Ambulatory Visit (INDEPENDENT_AMBULATORY_CARE_PROVIDER_SITE_OTHER): Payer: PPO | Admitting: *Deleted

## 2016-04-05 DIAGNOSIS — I4891 Unspecified atrial fibrillation: Secondary | ICD-10-CM

## 2016-04-05 LAB — POCT INR: INR: 2.7

## 2016-04-26 ENCOUNTER — Ambulatory Visit (INDEPENDENT_AMBULATORY_CARE_PROVIDER_SITE_OTHER): Payer: PPO | Admitting: Pharmacist

## 2016-04-26 DIAGNOSIS — I4891 Unspecified atrial fibrillation: Secondary | ICD-10-CM | POA: Diagnosis not present

## 2016-04-26 LAB — POCT INR: INR: 3.7

## 2016-05-14 ENCOUNTER — Ambulatory Visit (INDEPENDENT_AMBULATORY_CARE_PROVIDER_SITE_OTHER): Payer: PPO | Admitting: *Deleted

## 2016-05-14 DIAGNOSIS — I4891 Unspecified atrial fibrillation: Secondary | ICD-10-CM

## 2016-05-14 LAB — POCT INR: INR: 2.8

## 2016-05-31 ENCOUNTER — Other Ambulatory Visit: Payer: Self-pay | Admitting: Internal Medicine

## 2016-06-11 ENCOUNTER — Ambulatory Visit (INDEPENDENT_AMBULATORY_CARE_PROVIDER_SITE_OTHER): Payer: PPO | Admitting: *Deleted

## 2016-06-11 DIAGNOSIS — I4891 Unspecified atrial fibrillation: Secondary | ICD-10-CM

## 2016-06-11 LAB — POCT INR: INR: 2.4

## 2016-06-15 DIAGNOSIS — M1712 Unilateral primary osteoarthritis, left knee: Secondary | ICD-10-CM | POA: Diagnosis not present

## 2016-08-02 ENCOUNTER — Ambulatory Visit (INDEPENDENT_AMBULATORY_CARE_PROVIDER_SITE_OTHER): Payer: PPO | Admitting: *Deleted

## 2016-08-02 DIAGNOSIS — I4891 Unspecified atrial fibrillation: Secondary | ICD-10-CM | POA: Diagnosis not present

## 2016-08-02 LAB — POCT INR: INR: 3.3

## 2016-08-22 DIAGNOSIS — I4891 Unspecified atrial fibrillation: Secondary | ICD-10-CM | POA: Diagnosis not present

## 2016-08-22 DIAGNOSIS — R231 Pallor: Secondary | ICD-10-CM | POA: Diagnosis not present

## 2016-08-22 DIAGNOSIS — I10 Essential (primary) hypertension: Secondary | ICD-10-CM | POA: Diagnosis not present

## 2016-08-23 ENCOUNTER — Ambulatory Visit (INDEPENDENT_AMBULATORY_CARE_PROVIDER_SITE_OTHER): Payer: PPO | Admitting: *Deleted

## 2016-08-23 DIAGNOSIS — I4891 Unspecified atrial fibrillation: Secondary | ICD-10-CM | POA: Diagnosis not present

## 2016-08-23 LAB — POCT INR: INR: 2.7

## 2016-09-12 ENCOUNTER — Ambulatory Visit (HOSPITAL_COMMUNITY): Admission: RE | Admit: 2016-09-12 | Payer: Self-pay | Source: Ambulatory Visit

## 2016-09-12 ENCOUNTER — Encounter (HOSPITAL_COMMUNITY): Payer: Self-pay

## 2016-09-12 ENCOUNTER — Ambulatory Visit (HOSPITAL_COMMUNITY)
Admission: RE | Admit: 2016-09-12 | Discharge: 2016-09-12 | Disposition: A | Discharge status: Home / Self Care | Payer: PPO | Source: Ambulatory Visit | Attending: Cardiovascular Disease | Admitting: Cardiovascular Disease

## 2016-09-12 ENCOUNTER — Other Ambulatory Visit (HOSPITAL_COMMUNITY): Payer: Self-pay | Admitting: Orthopedic Surgery

## 2016-09-12 DIAGNOSIS — R52 Pain, unspecified: Secondary | ICD-10-CM

## 2016-09-12 DIAGNOSIS — N8111 Cystocele, midline: Secondary | ICD-10-CM | POA: Diagnosis not present

## 2016-09-12 DIAGNOSIS — R32 Unspecified urinary incontinence: Secondary | ICD-10-CM | POA: Diagnosis not present

## 2016-09-12 DIAGNOSIS — M1712 Unilateral primary osteoarthritis, left knee: Secondary | ICD-10-CM | POA: Diagnosis not present

## 2016-09-12 NOTE — Progress Notes (Signed)
Today's bilateral lower extremity venous duplex is negative for DVT. There is interstitial fluid in the left popliteal space extending into the proximal calf, possible ruptured Baker's cyst. Preliminary results given to [place].

## 2016-09-17 ENCOUNTER — Telehealth: Payer: Self-pay | Admitting: Internal Medicine

## 2016-09-17 NOTE — Telephone Encounter (Signed)
Returned call to patient.She stated she has been having swelling in both lower legs for appox 3 months.Stated both legs upper and lower are red splotchy.Stated her orthopedic Dr.thought might be coming from coumadin.Stated she would like to see Dr.Klein.Stated she had to cancel previous appointment due to her sister's death.Advised she has appointment already with Dr.Klein 10/09/16 at 3:15 pm.Advised I could make her appointment sooner with extender.Stated she rather keep appointment with Dr.Klein.

## 2016-09-17 NOTE — Telephone Encounter (Signed)
New Message     Her orthopedic doctor is concerned she has been on coumadin too long, her legs are splotchy and she is having SOB, tachycardia .   Pt has appt on 10/09/16

## 2016-09-20 ENCOUNTER — Ambulatory Visit (INDEPENDENT_AMBULATORY_CARE_PROVIDER_SITE_OTHER): Payer: PPO | Admitting: *Deleted

## 2016-09-20 DIAGNOSIS — I4891 Unspecified atrial fibrillation: Secondary | ICD-10-CM

## 2016-09-20 LAB — POCT INR: INR: 2.5

## 2016-09-23 DIAGNOSIS — L920 Granuloma annulare: Secondary | ICD-10-CM

## 2016-09-23 HISTORY — DX: Granuloma annulare: L92.0

## 2016-09-24 ENCOUNTER — Encounter: Payer: Self-pay | Admitting: Internal Medicine

## 2016-09-26 ENCOUNTER — Other Ambulatory Visit: Payer: Self-pay | Admitting: Internal Medicine

## 2016-09-27 ENCOUNTER — Ambulatory Visit: Payer: PPO | Admitting: Nurse Practitioner

## 2016-09-28 DIAGNOSIS — N8111 Cystocele, midline: Secondary | ICD-10-CM | POA: Diagnosis not present

## 2016-10-01 DIAGNOSIS — I872 Venous insufficiency (chronic) (peripheral): Secondary | ICD-10-CM | POA: Diagnosis not present

## 2016-10-01 DIAGNOSIS — L920 Granuloma annulare: Secondary | ICD-10-CM | POA: Diagnosis not present

## 2016-10-09 ENCOUNTER — Ambulatory Visit (INDEPENDENT_AMBULATORY_CARE_PROVIDER_SITE_OTHER): Payer: PPO | Admitting: Internal Medicine

## 2016-10-09 ENCOUNTER — Encounter: Payer: Self-pay | Admitting: Internal Medicine

## 2016-10-09 ENCOUNTER — Ambulatory Visit (INDEPENDENT_AMBULATORY_CARE_PROVIDER_SITE_OTHER): Payer: PPO | Admitting: *Deleted

## 2016-10-09 VITALS — BP 110/64 | HR 68 | Ht 63.0 in | Wt 158.0 lb

## 2016-10-09 DIAGNOSIS — I480 Paroxysmal atrial fibrillation: Secondary | ICD-10-CM | POA: Diagnosis not present

## 2016-10-09 DIAGNOSIS — I499 Cardiac arrhythmia, unspecified: Secondary | ICD-10-CM | POA: Diagnosis not present

## 2016-10-09 DIAGNOSIS — I4891 Unspecified atrial fibrillation: Secondary | ICD-10-CM

## 2016-10-09 DIAGNOSIS — I498 Other specified cardiac arrhythmias: Secondary | ICD-10-CM

## 2016-10-09 LAB — POCT INR: INR: 3.2

## 2016-10-09 NOTE — Progress Notes (Signed)
Patient Care Team: Levin Erp, MD as PCP - General (Internal Medicine)   HPI  Samantha Foley is a 83 y.o. female Seen in followup for paroxysmal atrial fibrillation of long-standing  She's been having problems with dizziness which she describes to the initiation of her apixaban and we went back to warfain, dizziness resolved  She is having infrequent tachypalpitations. There is somewhat bothersome.  She continues to struggle with grief  Her husband died about 2 years ago,  He called her [name]     Past Medical History:  Diagnosis Date  . Arthritis   . Atrial fibrillation ([place])    reported but not yet documented  . Chronic back pain    Dr. Tonita Cong  . Chronic knee pain    Dr. Rhona Raider  . Colon polyp    Dr. Wynetta Emery  . Dysrhythmia    atrial fib- mild per pt- no meds  . Food allergy    Scallops--no other seafood allergies  . GERD (gastroesophageal reflux disease)   . Hiatal hernia   . Hyperlipidemia   . Impaired glucose tolerance   . Lactose intolerance   . Osteopenia   . PVC's (premature ventricular contractions)   . Seasonal allergies   . Vision problem    wears contacts    Past Surgical History:  Procedure Laterality Date  . BACK SURGERY     several vertbea ae rcemntekd  . ROBOTIC ASSISTED TOTAL HYSTERECTOMY WITH BILATERAL SALPINGO OOPHERECTOMY Bilateral 03/08/2016   Procedure: ROBOTIC ASSISTED TOTAL HYSTERECTOMY WITH BILATERAL SALPINGO OOPHORECTOMY/Uterosacral Ligament Suspension;  Surgeon: Princess Bruins, MD;  Location: [facility];  Service: Gynecology;  Laterality: Bilateral;  . sebaceous cyst excision  2009   neck  . TONSILLECTOMY AND ADENOIDECTOMY  age 71  . VERTEBROPLASTY  05/2009  . WISDOM TOOTH EXTRACTION      Current Outpatient Prescriptions  Medication Sig Dispense Refill  . Cyanocobalamin (VITAMIN B-12 PO) Take 1 tablet by mouth daily.    Marland Kitchen omeprazole (PRILOSEC) 20 MG capsule Take 20 mg by mouth daily.     . vitamin C (ASCORBIC ACID) 500  MG tablet Take 500 mg by mouth daily.    Marland Kitchen warfarin (COUMADIN) 1 MG tablet Take 1-1.5 mg by mouth daily. Patient takes 1.5 mg on Monday, Wednesday and Friday and takes 1 mg on Tuesday,Thursday ,Sat and Sun    . warfarin (COUMADIN) 1 MG tablet TAKE 1-1 1/2 TABLETS DAILY AS DIRECTED PER COUMADIN CLINIC 40 tablet 4   No current facility-administered medications for this visit.     Allergies  Allergen Reactions  . Ciprofloxacin Shortness Of Breath and Rash  . Scallops [Shellfish Allergy] Anaphylaxis  . Apixaban Other (See Comments)    fatigue  . Demerol Other (See Comments)    Patient states it made her crazy and sleep for 12 hours  . Milk-Related Compounds Diarrhea  . Penicillins Other (See Comments)    unknown  . Rivaroxaban Other (See Comments)    Pt unsure but d/c use  . Sudafed [Pseudoephedrine Hcl] Other (See Comments)    hyperactive    Review of Systems negative except from HPI and PMH  Physical Exam BP 110/64   Pulse 68   Ht 5\' 3"  (1.6 m)   Wt 158 lb (71.7 kg)   SpO2 98%   BMI 27.99 kg/m  Well developed and well nourished in no acute distress HENT normal E scleral and icterus clear Neck Supple JVP flat; carotids brisk and full Clear  to ausculation  Regular rate and rhythm, no murmurs gallops or rub Soft with active bowel sounds No clubbing cyanosis no  Edema Alert and oriented, grossly normal motor and sensory function Skin Warm and Dry  NSR @73  Intervals 15/08/39  Sinus arrhythmia   Assessment and  Plan  Paroxysmal atrial fibrillation   Chest pain atypical  Patient has had infrequent tachypalpiations  She remains on warfarin having not tolerated NOACs.   She is to get alivecor in the event that they become increasingly problematic

## 2016-10-10 DIAGNOSIS — M1712 Unilateral primary osteoarthritis, left knee: Secondary | ICD-10-CM | POA: Diagnosis not present

## 2016-10-10 DIAGNOSIS — G8929 Other chronic pain: Secondary | ICD-10-CM | POA: Diagnosis not present

## 2016-10-10 DIAGNOSIS — M25562 Pain in left knee: Secondary | ICD-10-CM | POA: Diagnosis not present

## 2016-10-29 ENCOUNTER — Ambulatory Visit (INDEPENDENT_AMBULATORY_CARE_PROVIDER_SITE_OTHER): Payer: PPO | Admitting: *Deleted

## 2016-10-29 DIAGNOSIS — I4891 Unspecified atrial fibrillation: Secondary | ICD-10-CM

## 2016-10-29 LAB — POCT INR: INR: 3

## 2016-11-21 ENCOUNTER — Ambulatory Visit (INDEPENDENT_AMBULATORY_CARE_PROVIDER_SITE_OTHER): Payer: PPO | Admitting: *Deleted

## 2016-11-21 DIAGNOSIS — I4891 Unspecified atrial fibrillation: Secondary | ICD-10-CM

## 2016-11-21 LAB — POCT INR: INR: 2.2

## 2016-11-27 DIAGNOSIS — D3132 Benign neoplasm of left choroid: Secondary | ICD-10-CM | POA: Diagnosis not present

## 2016-11-27 DIAGNOSIS — H353131 Nonexudative age-related macular degeneration, bilateral, early dry stage: Secondary | ICD-10-CM | POA: Diagnosis not present

## 2016-11-27 DIAGNOSIS — Z961 Presence of intraocular lens: Secondary | ICD-10-CM | POA: Diagnosis not present

## 2016-12-11 DIAGNOSIS — M25562 Pain in left knee: Secondary | ICD-10-CM | POA: Diagnosis not present

## 2016-12-11 DIAGNOSIS — M1712 Unilateral primary osteoarthritis, left knee: Secondary | ICD-10-CM | POA: Diagnosis not present

## 2016-12-20 ENCOUNTER — Ambulatory Visit (INDEPENDENT_AMBULATORY_CARE_PROVIDER_SITE_OTHER): Payer: PPO

## 2016-12-20 DIAGNOSIS — I4891 Unspecified atrial fibrillation: Secondary | ICD-10-CM

## 2016-12-20 LAB — POCT INR: INR: 2.6

## 2016-12-25 DIAGNOSIS — K2970 Gastritis, unspecified, without bleeding: Secondary | ICD-10-CM | POA: Diagnosis not present

## 2016-12-25 DIAGNOSIS — R1111 Vomiting without nausea: Secondary | ICD-10-CM | POA: Diagnosis not present

## 2016-12-25 DIAGNOSIS — R197 Diarrhea, unspecified: Secondary | ICD-10-CM | POA: Diagnosis not present

## 2016-12-27 DIAGNOSIS — E860 Dehydration: Secondary | ICD-10-CM | POA: Diagnosis not present

## 2016-12-27 DIAGNOSIS — I10 Essential (primary) hypertension: Secondary | ICD-10-CM | POA: Diagnosis not present

## 2016-12-27 DIAGNOSIS — I4891 Unspecified atrial fibrillation: Secondary | ICD-10-CM | POA: Diagnosis not present

## 2017-01-06 DIAGNOSIS — I10 Essential (primary) hypertension: Secondary | ICD-10-CM | POA: Diagnosis not present

## 2017-01-07 DIAGNOSIS — L2081 Atopic neurodermatitis: Secondary | ICD-10-CM | POA: Diagnosis not present

## 2017-01-13 ENCOUNTER — Emergency Department (HOSPITAL_COMMUNITY)
Admission: EM | Admit: 2017-01-13 | Discharge: 2017-01-13 | Disposition: A | Discharge status: Home / Self Care | Payer: PPO | Attending: Emergency Medicine | Admitting: Emergency Medicine

## 2017-01-13 ENCOUNTER — Encounter (HOSPITAL_COMMUNITY): Payer: Self-pay | Admitting: Emergency Medicine

## 2017-01-13 ENCOUNTER — Emergency Department (HOSPITAL_COMMUNITY): Payer: PPO

## 2017-01-13 DIAGNOSIS — I10 Essential (primary) hypertension: Secondary | ICD-10-CM | POA: Diagnosis not present

## 2017-01-13 DIAGNOSIS — Z7901 Long term (current) use of anticoagulants: Secondary | ICD-10-CM | POA: Diagnosis not present

## 2017-01-13 DIAGNOSIS — I480 Paroxysmal atrial fibrillation: Secondary | ICD-10-CM | POA: Diagnosis not present

## 2017-01-13 DIAGNOSIS — J9811 Atelectasis: Secondary | ICD-10-CM | POA: Diagnosis not present

## 2017-01-13 DIAGNOSIS — R404 Transient alteration of awareness: Secondary | ICD-10-CM | POA: Diagnosis not present

## 2017-01-13 DIAGNOSIS — R42 Dizziness and giddiness: Secondary | ICD-10-CM | POA: Insufficient documentation

## 2017-01-13 DIAGNOSIS — R531 Weakness: Secondary | ICD-10-CM | POA: Diagnosis not present

## 2017-01-13 DIAGNOSIS — Z79899 Other long term (current) drug therapy: Secondary | ICD-10-CM | POA: Diagnosis not present

## 2017-01-13 DIAGNOSIS — R631 Polydipsia: Secondary | ICD-10-CM | POA: Diagnosis not present

## 2017-01-13 DIAGNOSIS — J189 Pneumonia, unspecified organism: Secondary | ICD-10-CM | POA: Diagnosis not present

## 2017-01-13 LAB — CBC
HEMATOCRIT: 39.1 % (ref 36.0–46.0)
Hemoglobin: 12.5 g/dL (ref 12.0–15.0)
MCH: 28.3 pg (ref 26.0–34.0)
MCHC: 32 g/dL (ref 30.0–36.0)
MCV: 88.7 fL (ref 78.0–100.0)
PLATELETS: 185 10*3/uL (ref 150–400)
RBC: 4.41 MIL/uL (ref 3.87–5.11)
RDW: 14.3 % (ref 11.5–15.5)
WBC: 6.4 10*3/uL (ref 4.0–10.5)

## 2017-01-13 LAB — BASIC METABOLIC PANEL
Anion gap: 6 (ref 5–15)
BUN: 8 mg/dL (ref 6–20)
CHLORIDE: 104 mmol/L (ref 101–111)
CO2: 29 mmol/L (ref 22–32)
CREATININE: 0.68 mg/dL (ref 0.44–1.00)
Calcium: 9.1 mg/dL (ref 8.9–10.3)
GFR calc Af Amer: >60 mL/min (ref >60)
GFR calc non Af Amer: >60 mL/min (ref >60)
GLUCOSE: 103 mg/dL — AB (ref 65–99)
POTASSIUM: 3.8 mmol/L (ref 3.5–5.1)
Sodium: 139 mmol/L (ref 135–145)

## 2017-01-13 LAB — TSH: TSH: 1.226 u[IU]/mL (ref 0.350–4.500)

## 2017-01-13 LAB — PROTIME-INR
INR: 2.58
Prothrombin Time: 28.2 seconds — ABNORMAL HIGH (ref 11.4–15.2)

## 2017-01-13 LAB — MAGNESIUM: Magnesium: 2.2 mg/dL (ref 1.7–2.4)

## 2017-01-13 LAB — CBG MONITORING, ED: Glucose-Capillary: 103 mg/dL — ABNORMAL HIGH (ref 65–99)

## 2017-01-13 LAB — I-STAT CG4 LACTIC ACID, ED: LACTIC ACID, VENOUS: 0.71 mmol/L (ref 0.5–1.9)

## 2017-01-13 MED ORDER — DOXYCYCLINE HYCLATE 100 MG PO TABS
100.0000 mg | ORAL_TABLET | Freq: Once | ORAL | Status: AC
Start: 2017-01-13 — End: 2017-01-13
  Administered 2017-01-13: 100 mg via ORAL
  Filled 2017-01-13: qty 1

## 2017-01-13 MED ORDER — DOXYCYCLINE HYCLATE 100 MG PO CAPS: 100.0000 mg | ORAL_CAPSULE | Freq: Two times a day (BID) | ORAL | 0 refills | Status: AC

## 2017-01-13 MED ORDER — SODIUM CHLORIDE 0.9 % IV BOLUS (SEPSIS)
1000.0000 mL | Freq: Once | INTRAVENOUS | Status: AC
  Administered 2017-01-13: 1000 mL via INTRAVENOUS

## 2017-01-13 NOTE — ED Provider Notes (Signed)
[place] DEPT Provider Note   CSN: [id number] Arrival date & time: 01/13/17  1245     History   Chief Complaint Chief Complaint  Patient presents with  . Weakness     HPI Samantha Foley is a 83 y.o. female.  HPI  Very pleasant 83 yo F with known AFib, HLD, HTN here with generalized weakness. Pt states that approx 3-4 weeks ago, she had a series of two illnesses with heavy vomiting and diarrhea. Since then, she has had generalized weakness. She reports she still feels like she has not "bounced back." She has felt fatigued, has had low energy. She has been trying to "fight through it." and notices she has been sleeping more, which does improve her fatigue. She has not had any particular DOE, chest pain, palpitations, or SOB. No urinary symptoms. She was at church earlier today when she began to feel generally weak and lightheaded when she was hot in the church. She asked for help and was assessed, advised to come to the ER. She currently feels improved. She feels like this has been coming and going "for weeks."  Past Medical History:  Diagnosis Date  . Arthritis   . Atrial fibrillation ([place])    reported but not yet documented  . Chronic back pain    Dr. Tonita Cong  . Chronic knee pain    Dr. Rhona Raider  . Colon polyp    Dr. Wynetta Emery  . Dysrhythmia    atrial fib- mild per pt- no meds  . Food allergy    Scallops--no other seafood allergies  . GERD (gastroesophageal reflux disease)   . Hiatal hernia   . Hyperlipidemia   . Impaired glucose tolerance   . Lactose intolerance   . Osteopenia   . PVC's (premature ventricular contractions)   . Seasonal allergies   . Vision problem    wears contacts    Patient Active Problem List   Diagnosis Date Noted  . S/P total hysterectomy - [place] 03/09/2016  . Postoperative state 03/08/2016  . Age-related macular degeneration 06/28/2015  . Disorder of rotator cuff 06/28/2015  . Degenerative drusen 07/11/2014  . Chest pain 12/16/2013   . Shoulder injury 12/18/2012  . Lipoma of neck 02/27/2012  . GERD (gastroesophageal reflux disease) 08/08/2011  . Impaired fasting glucose 08/08/2011  . Pure hypercholesterolemia 08/08/2011  . Osteopenia 08/08/2011  . Atrial fibrillation ([place]) 08/08/2011    Past Surgical History:  Procedure Laterality Date  . BACK SURGERY     several vertbea ae rcemntekd  . ROBOTIC ASSISTED TOTAL HYSTERECTOMY WITH BILATERAL SALPINGO OOPHERECTOMY Bilateral 03/08/2016   Procedure: ROBOTIC ASSISTED TOTAL HYSTERECTOMY WITH BILATERAL SALPINGO OOPHORECTOMY/Uterosacral Ligament Suspension;  Surgeon: Princess Bruins, MD;  Location: [facility];  Service: Gynecology;  Laterality: Bilateral;  . sebaceous cyst excision  2009   neck  . TONSILLECTOMY AND ADENOIDECTOMY  age 17  . VERTEBROPLASTY  05/2009  . WISDOM TOOTH EXTRACTION      OB History    No data available       Home Medications    Prior to Admission medications   Medication Sig Start Date End Date Taking? Authorizing Provider  Cyanocobalamin (VITAMIN B-12 PO) Take 1 tablet by mouth daily.   Yes [provider]  omeprazole (PRILOSEC) 20 MG capsule Take 20 mg by mouth daily.    Yes [provider]  warfarin (COUMADIN) 1 MG tablet TAKE 1-1 1/2 TABLETS DAILY AS DIRECTED PER COUMADIN CLINIC Patient taking differently: Take 1 mg by mouth daily  on Sunday, Tuesday, Thursday and Saturday. Take 1.5 mg by mouth daily on all other days 09/26/16  Yes Deboraha Sprang, MD  doxycycline (VIBRAMYCIN) 100 MG capsule Take 1 capsule (100 mg total) by mouth 2 (two) times daily. 01/13/17 01/20/17  Duffy Bruce, MD    Family History Family History  Problem Relation Age of Onset  . Rheumatic fever Mother   . Diabetes Mother   . Heart disease Mother        rheumatic heart disease  . Cancer Sister 20       breast cancer  . Cancer Brother        bladder and prostate cancer  . Migraines Daughter   . Cancer Sister        leukemia at 36; colon cancer  in her late 30's--family ?'s dx    Social History Social History  Substance Use Topics  . Smoking status: Never Smoker  . Smokeless tobacco: Never Used  . Alcohol use No     Comment: maybe one glass per year.     Allergies   Ciprofloxacin; Scallops [shellfish allergy]; Apixaban; Demerol; Milk-related compounds; Penicillins; Rivaroxaban; and Sudafed [pseudoephedrine hcl]   Review of Systems Review of Systems  Constitutional: Positive for fatigue.  Neurological: Positive for weakness.  All other systems reviewed and are negative.    Physical Exam Updated Vital Signs BP (!) 163/72   Pulse 69   Temp (!) 97.5 F (36.4 C) (Oral)   Resp 15   SpO2 98%   Physical Exam  Constitutional: She is oriented to person, place, and time. She appears well-developed and well-nourished. No distress.  HENT:  Head: Normocephalic and atraumatic.  Eyes: Conjunctivae are normal.  Neck: Neck supple.  Cardiovascular: Normal rate, regular rhythm and normal heart sounds.  Exam reveals no friction rub.   No murmur heard. Pulmonary/Chest: Effort normal. No respiratory distress. She has no wheezes. She has rales (mild, bibasilar).  Abdominal: She exhibits no distension.  Musculoskeletal: She exhibits no edema.  Neurological: She is alert and oriented to person, place, and time. She exhibits normal muscle tone.  Skin: Skin is warm. Capillary refill takes less than 2 seconds.  Psychiatric: She has a normal mood and affect.  Nursing note and vitals reviewed.    ED Treatments / Results  Labs (all labs ordered are listed, but only abnormal results are displayed) Labs Reviewed  BASIC METABOLIC PANEL - Abnormal; Notable for the following:       Result Value   Glucose, Bld 103 (*)    All other components within normal limits  PROTIME-INR - Abnormal; Notable for the following:    Prothrombin Time 28.2 (*)    All other components within normal limits  CBG MONITORING, ED - Abnormal; Notable for the  following:    Glucose-Capillary 103 (*)    All other components within normal limits  CBC  MAGNESIUM  TSH  I-STAT CG4 LACTIC ACID, ED    EKG  EKG Interpretation  Date/Time:  Sunday January 13 2017 12:50:36 EDT Ventricular Rate:  62 PR Interval:    QRS Duration: 95 QT Interval:  425 QTC Calculation: 432 R Axis:   76 Text Interpretation:  Sinus arrhythmia No significant change since last tracing Confirmed by Duffy Bruce [phone]) on 01/13/2017 3:23:15 PM       Radiology Dg Chest 2 View  Result Date: 01/13/2017 CLINICAL DATA:  Weakness and dizziness.  Near syncope. EXAM: CHEST  2 VIEW COMPARISON:  12/16/2013 FINDINGS: Artifact overlies  the chest. There is left ventricular prominence. There is aortic atherosclerosis and tortuosity. There is mild atelectasis in both lower lobes left worse than right. This could be simple atelectasis or mild pneumonia. No effusions. Old vertebral augmentation at L1. IMPRESSION: Mild atelectasis at both lung bases left more than right. This could be simple atelectasis or mild pneumonia. Electronically Signed   By: Nelson Chimes M.D.   On: 01/13/2017 13:55    Procedures Procedures (including critical care time)  Medications Ordered in ED Medications  sodium chloride 0.9 % bolus 1,000 mL (0 mLs Intravenous Stopped 01/13/17 1617)  doxycycline (VIBRA-TABS) tablet 100 mg (100 mg Oral Given 01/13/17 1616)     Initial Impression / Assessment and Plan / ED Course  I have reviewed the triage vital signs and the nursing notes.  Pertinent labs & imaging results that were available during my care of the patient were reviewed by me and considered in my medical decision making (see chart for details).     83 yo F here with mild, generalized, persistent weakness following viral GI illness several weeks ago. Labs, imaging are as above. I suspect pt has component of ongoing mild dehydration clinically, as well as possible atypical/mild PNA, possibly due to vomiting  and decreased mobility while ill. She is o/w very well appearing. She has no focal neuro deficits to suggest CVA or intracranial pathology. Renal function normal. Lytes WNL. She may also intermittently be going into AFib, but is in NSR here, with rate controlled, and therapeutic INR. Will start her on doxy given her allergies, advise outpt Cards f/u for possible holter monitor. Pt in agreement. Encouraged PO fluids, return precautions.  Final Clinical Impressions(s) / ED Diagnoses   Final diagnoses:  Community acquired pneumonia, unspecified laterality  Generalized weakness  Paroxysmal atrial fibrillation ([place])    New Prescriptions Discharge Medication List as of 01/13/2017  3:37 PM    START taking these medications   Details  doxycycline (VIBRAMYCIN) 100 MG capsule Take 1 capsule (100 mg total) by mouth 2 (two) times daily., Starting Sun 01/13/2017, Until Sun 01/20/2017, Print         Duffy Bruce, MD 01/14/17 1257

## 2017-01-13 NOTE — ED Notes (Signed)
Pt ambulated to bathroom and back without difficulty.

## 2017-01-13 NOTE — ED Triage Notes (Signed)
Patient from church with weakness, patient states she has felt weak for two weeks.  History of afib, states that the weakness subsides if she lays down.  No focal deficits, no LOC, anticoagulated with coumadin.  Patient is alert and oriented and in no apparent distress at this time.  18g saline lock in left AC.

## 2017-01-17 DIAGNOSIS — R0902 Hypoxemia: Secondary | ICD-10-CM | POA: Diagnosis not present

## 2017-01-17 DIAGNOSIS — J189 Pneumonia, unspecified organism: Secondary | ICD-10-CM | POA: Diagnosis not present

## 2017-01-22 ENCOUNTER — Encounter: Payer: Self-pay | Admitting: Internal Medicine

## 2017-01-22 ENCOUNTER — Ambulatory Visit (INDEPENDENT_AMBULATORY_CARE_PROVIDER_SITE_OTHER): Payer: PPO | Admitting: Internal Medicine

## 2017-01-22 VITALS — BP 122/60 | HR 87 | Ht 62.0 in | Wt 156.0 lb

## 2017-01-22 DIAGNOSIS — I480 Paroxysmal atrial fibrillation: Secondary | ICD-10-CM | POA: Diagnosis not present

## 2017-01-22 DIAGNOSIS — I498 Other specified cardiac arrhythmias: Secondary | ICD-10-CM

## 2017-01-22 DIAGNOSIS — I499 Cardiac arrhythmia, unspecified: Secondary | ICD-10-CM | POA: Diagnosis not present

## 2017-01-22 NOTE — Progress Notes (Signed)
Patient Care Team: Levin Erp, MD as PCP - General (Internal Medicine)   HPI  Samantha Foley is a 83 y.o. female Seen in followup for paroxysmal atrial fibrillation of long-standing    She is having infrequent palpitations. The patient denies chest pain, shortness of breath, nocturnal dyspnea, orthopnea or peripheral edema.  There has been no lightheadedness or syncope.    She was seen in the emergency room last week for slowly progressive over months weakness. ER notes and labs were reviewed. Pneumonia was diagnosed   Her strength is gradually recovering.    Her husband died about 21/2  years ago,  He called her [name]  She met him when she was 71     Past Medical History:  Diagnosis Date  . Arthritis   . Atrial fibrillation ([place])    reported but not yet documented  . Chronic back pain    Dr. Tonita Cong  . Chronic knee pain    Dr. Rhona Raider  . Colon polyp    Dr. Wynetta Emery  . Dysrhythmia    atrial fib- mild per pt- no meds  . Food allergy    Scallops--no other seafood allergies  . GERD (gastroesophageal reflux disease)   . Hiatal hernia   . Hyperlipidemia   . Impaired glucose tolerance   . Lactose intolerance   . Osteopenia   . PVC's (premature ventricular contractions)   . Seasonal allergies   . Vision problem    wears contacts    Past Surgical History:  Procedure Laterality Date  . BACK SURGERY     several vertbea ae rcemntekd  . ROBOTIC ASSISTED TOTAL HYSTERECTOMY WITH BILATERAL SALPINGO OOPHERECTOMY Bilateral 03/08/2016   Procedure: ROBOTIC ASSISTED TOTAL HYSTERECTOMY WITH BILATERAL SALPINGO OOPHORECTOMY/Uterosacral Ligament Suspension;  Surgeon: Princess Bruins, MD;  Location: [facility];  Service: Gynecology;  Laterality: Bilateral;  . sebaceous cyst excision  2009   neck  . TONSILLECTOMY AND ADENOIDECTOMY  age 70  . VERTEBROPLASTY  05/2009  . WISDOM TOOTH EXTRACTION      Current Outpatient Prescriptions  Medication Sig Dispense Refill  .  Cyanocobalamin (VITAMIN B-12 PO) Take 1 tablet by mouth daily.    Marland Kitchen omeprazole (PRILOSEC) 20 MG capsule Take 20 mg by mouth daily.     Marland Kitchen warfarin (COUMADIN) 1 MG tablet TAKE 1-1 1/2 TABLETS DAILY AS DIRECTED PER COUMADIN CLINIC (Patient taking differently: Take 1 mg by mouth daily on Sunday, Tuesday, Thursday and Saturday. Take 1.5 mg by mouth daily on all other days) 40 tablet 4   No current facility-administered medications for this visit.     Allergies  Allergen Reactions  . Ciprofloxacin Shortness Of Breath and Rash  . Scallops [Shellfish Allergy] Anaphylaxis  . Apixaban Other (See Comments)    fatigue  . Demerol Other (See Comments)    Patient states it made her crazy and sleep for 12 hours  . Doxycycline Nausea And Vomiting  . Milk-Related Compounds Diarrhea  . Penicillins Other (See Comments)    unknown  . Rivaroxaban Other (See Comments)    Pt unsure but d/c use  . Sudafed [Pseudoephedrine Hcl] Other (See Comments)    hyperactive    Review of Systems negative except from HPI and PMH  Physical Exam BP 122/60   Pulse 87   Ht '5\' 2"'  (1.575 m)   Wt 156 lb (70.8 kg)   SpO2 97%   BMI 28.53 kg/m  Well developed and nourished in no acute distress HENT normal  Neck supple with JVP-flat Carotids brisk and full without bruits Clear Regular rate and rhythm, no murmurs or gallops Abd-soft with active BS without hepatomegaly No Clubbing cyanosis edema Skin-warm and dry A & Oriented  Grossly normal sensory and motor function  ECG   Assessment and Plan   Paroxysmal atrial fibrillation   No intercurrent atrial fibrillation or flutter  On Anticoagulation;  No bleeding issues

## 2017-01-22 NOTE — Patient Instructions (Addendum)
Medication Instructions: - Your physician recommends that you continue on your current medications as directed. Please refer to the Current Medication list given to you today.  Labwork: - none ordered  Procedures/Testing: - none ordered  Follow-Up: - Your physician wants you to follow-up in: 6 months with Roderic Palau, NP in the a-fib clinic for Dr. Caryl Comes. You will receive a reminder letter in the mail two months in advance. If you don't receive a letter, please call our office to schedule the follow-up appointment.  Any Additional Special Instructions Will Be Listed Below (If Applicable).     If you need a refill on your cardiac medications before your next appointment, please call your pharmacy.

## 2017-01-25 DIAGNOSIS — N939 Abnormal uterine and vaginal bleeding, unspecified: Secondary | ICD-10-CM | POA: Diagnosis not present

## 2017-01-29 DIAGNOSIS — N939 Abnormal uterine and vaginal bleeding, unspecified: Secondary | ICD-10-CM | POA: Diagnosis not present

## 2017-01-29 DIAGNOSIS — N952 Postmenopausal atrophic vaginitis: Secondary | ICD-10-CM | POA: Diagnosis not present

## 2017-02-07 DIAGNOSIS — I4891 Unspecified atrial fibrillation: Secondary | ICD-10-CM | POA: Diagnosis not present

## 2017-02-07 DIAGNOSIS — Z0000 Encounter for general adult medical examination without abnormal findings: Secondary | ICD-10-CM | POA: Diagnosis not present

## 2017-02-07 DIAGNOSIS — N814 Uterovaginal prolapse, unspecified: Secondary | ICD-10-CM | POA: Diagnosis not present

## 2017-02-07 DIAGNOSIS — R030 Elevated blood-pressure reading, without diagnosis of hypertension: Secondary | ICD-10-CM | POA: Diagnosis not present

## 2017-02-21 ENCOUNTER — Other Ambulatory Visit: Payer: Self-pay | Admitting: Internal Medicine

## 2017-02-22 ENCOUNTER — Ambulatory Visit (INDEPENDENT_AMBULATORY_CARE_PROVIDER_SITE_OTHER): Payer: PPO

## 2017-02-22 DIAGNOSIS — I4891 Unspecified atrial fibrillation: Secondary | ICD-10-CM

## 2017-02-22 DIAGNOSIS — I480 Paroxysmal atrial fibrillation: Secondary | ICD-10-CM | POA: Diagnosis not present

## 2017-02-22 LAB — POCT INR: INR: 3

## 2017-02-28 ENCOUNTER — Other Ambulatory Visit: Payer: Self-pay | Admitting: Internal Medicine

## 2017-02-28 ENCOUNTER — Other Ambulatory Visit: Payer: Self-pay | Admitting: *Deleted

## 2017-03-05 DIAGNOSIS — R0902 Hypoxemia: Secondary | ICD-10-CM | POA: Diagnosis not present

## 2017-03-05 DIAGNOSIS — G933 Postviral fatigue syndrome: Secondary | ICD-10-CM | POA: Diagnosis not present

## 2017-03-05 DIAGNOSIS — R5382 Chronic fatigue, unspecified: Secondary | ICD-10-CM | POA: Diagnosis not present

## 2017-03-05 DIAGNOSIS — Z23 Encounter for immunization: Secondary | ICD-10-CM | POA: Diagnosis not present

## 2017-03-05 DIAGNOSIS — D559 Anemia due to enzyme disorder, unspecified: Secondary | ICD-10-CM | POA: Diagnosis not present

## 2017-03-05 DIAGNOSIS — N939 Abnormal uterine and vaginal bleeding, unspecified: Secondary | ICD-10-CM | POA: Diagnosis not present

## 2017-03-05 DIAGNOSIS — I10 Essential (primary) hypertension: Secondary | ICD-10-CM | POA: Diagnosis not present

## 2017-03-13 DIAGNOSIS — N819 Female genital prolapse, unspecified: Secondary | ICD-10-CM | POA: Diagnosis not present

## 2017-03-13 DIAGNOSIS — N8111 Cystocele, midline: Secondary | ICD-10-CM | POA: Diagnosis not present

## 2017-03-13 DIAGNOSIS — N765 Ulceration of vagina: Secondary | ICD-10-CM | POA: Diagnosis not present

## 2017-03-20 DIAGNOSIS — N819 Female genital prolapse, unspecified: Secondary | ICD-10-CM | POA: Diagnosis not present

## 2017-03-25 DIAGNOSIS — M25062 Hemarthrosis, left knee: Secondary | ICD-10-CM

## 2017-03-25 HISTORY — DX: Hemarthrosis, left knee: M25.062

## 2017-04-15 ENCOUNTER — Ambulatory Visit (INDEPENDENT_AMBULATORY_CARE_PROVIDER_SITE_OTHER): Payer: PPO | Admitting: *Deleted

## 2017-04-15 DIAGNOSIS — I480 Paroxysmal atrial fibrillation: Secondary | ICD-10-CM

## 2017-04-15 DIAGNOSIS — Z5181 Encounter for therapeutic drug level monitoring: Secondary | ICD-10-CM | POA: Insufficient documentation

## 2017-04-15 DIAGNOSIS — I4891 Unspecified atrial fibrillation: Secondary | ICD-10-CM

## 2017-04-15 LAB — POCT INR: INR: 3.4

## 2017-04-17 DIAGNOSIS — N8111 Cystocele, midline: Secondary | ICD-10-CM | POA: Diagnosis not present

## 2017-04-17 DIAGNOSIS — N819 Female genital prolapse, unspecified: Secondary | ICD-10-CM | POA: Diagnosis not present

## 2017-04-19 ENCOUNTER — Emergency Department (HOSPITAL_COMMUNITY)
Admission: EM | Admit: 2017-04-19 | Discharge: 2017-04-20 | Disposition: A | Discharge status: Home / Self Care | Payer: PPO | Attending: Emergency Medicine | Admitting: Emergency Medicine

## 2017-04-19 ENCOUNTER — Encounter (HOSPITAL_COMMUNITY): Payer: Self-pay

## 2017-04-19 ENCOUNTER — Emergency Department (HOSPITAL_COMMUNITY): Payer: PPO

## 2017-04-19 DIAGNOSIS — G8929 Other chronic pain: Secondary | ICD-10-CM | POA: Insufficient documentation

## 2017-04-19 DIAGNOSIS — I4891 Unspecified atrial fibrillation: Secondary | ICD-10-CM | POA: Insufficient documentation

## 2017-04-19 DIAGNOSIS — Z79899 Other long term (current) drug therapy: Secondary | ICD-10-CM | POA: Insufficient documentation

## 2017-04-19 DIAGNOSIS — M25462 Effusion, left knee: Secondary | ICD-10-CM | POA: Diagnosis not present

## 2017-04-19 DIAGNOSIS — M2500 Hemarthrosis, unspecified joint: Secondary | ICD-10-CM

## 2017-04-19 DIAGNOSIS — M25062 Hemarthrosis, left knee: Secondary | ICD-10-CM | POA: Diagnosis not present

## 2017-04-19 DIAGNOSIS — Z7901 Long term (current) use of anticoagulants: Secondary | ICD-10-CM | POA: Insufficient documentation

## 2017-04-19 DIAGNOSIS — R030 Elevated blood-pressure reading, without diagnosis of hypertension: Secondary | ICD-10-CM | POA: Diagnosis not present

## 2017-04-19 DIAGNOSIS — M25562 Pain in left knee: Secondary | ICD-10-CM | POA: Diagnosis not present

## 2017-04-19 LAB — BASIC METABOLIC PANEL
ANION GAP: 10 (ref 5–15)
BUN: 16 mg/dL (ref 6–20)
CALCIUM: 9.4 mg/dL (ref 8.9–10.3)
CO2: 27 mmol/L (ref 22–32)
CREATININE: 0.69 mg/dL (ref 0.44–1.00)
Chloride: 102 mmol/L (ref 101–111)
Glucose, Bld: 102 mg/dL — ABNORMAL HIGH (ref 65–99)
Potassium: 3.9 mmol/L (ref 3.5–5.1)
Sodium: 139 mmol/L (ref 135–145)

## 2017-04-19 LAB — PROTIME-INR
INR: 2.33
PROTHROMBIN TIME: 25.4 s — AB (ref 11.4–15.2)

## 2017-04-19 LAB — CBC WITH DIFFERENTIAL/PLATELET
BASOS ABS: 0 10*3/uL (ref 0.0–0.1)
BASOS PCT: 0 %
EOS ABS: 0.1 10*3/uL (ref 0.0–0.7)
Eosinophils Relative: 1 %
HCT: 39.8 % (ref 36.0–46.0)
HEMOGLOBIN: 12.9 g/dL (ref 12.0–15.0)
Lymphocytes Relative: 15 %
Lymphs Abs: 1.4 10*3/uL (ref 0.7–4.0)
MCH: 28.9 pg (ref 26.0–34.0)
MCHC: 32.4 g/dL (ref 30.0–36.0)
MCV: 89 fL (ref 78.0–100.0)
MONO ABS: 0.6 10*3/uL (ref 0.1–1.0)
MONOS PCT: 6 %
NEUTROS PCT: 78 %
Neutro Abs: 7.6 10*3/uL (ref 1.7–7.7)
PLATELETS: 224 10*3/uL (ref 150–400)
RBC: 4.47 MIL/uL (ref 3.87–5.11)
RDW: 14.5 % (ref 11.5–15.5)
WBC: 9.7 10*3/uL (ref 4.0–10.5)

## 2017-04-19 MED ORDER — FENTANYL CITRATE (PF) 100 MCG/2ML IJ SOLN
25.0000 ug | Freq: Once | INTRAMUSCULAR | Status: AC
  Administered 2017-04-19: 25 ug via INTRAVENOUS
  Filled 2017-04-19: qty 2

## 2017-04-19 MED ORDER — SODIUM CHLORIDE 0.9 % IV BOLUS (SEPSIS)
500.0000 mL | Freq: Once | INTRAVENOUS | Status: AC
  Administered 2017-04-19: 500 mL via INTRAVENOUS

## 2017-04-19 MED ORDER — LIDOCAINE-EPINEPHRINE 2 %-1:100000 IJ SOLN
20.0000 mL | Freq: Once | INTRAMUSCULAR | Status: DC
  Filled 2017-04-19: qty 20

## 2017-04-19 MED ORDER — LIDOCAINE-EPINEPHRINE (PF) 2 %-1:200000 IJ SOLN
INTRAMUSCULAR | Status: AC
  Administered 2017-04-19: 5 mL
  Filled 2017-04-19: qty 20

## 2017-04-19 MED ORDER — MORPHINE SULFATE (PF) 4 MG/ML IV SOLN
4.0000 mg | Freq: Once | INTRAVENOUS | Status: AC
  Administered 2017-04-19: 4 mg via INTRAVENOUS
  Filled 2017-04-19: qty 1

## 2017-04-19 MED ORDER — OXYCODONE-ACETAMINOPHEN 5-325 MG PO TABS: 1.0000 | ORAL_TABLET | Freq: Four times a day (QID) | ORAL | 0 refills | Status: DC | PRN

## 2017-04-19 NOTE — Discharge Instructions (Signed)
Please see the information and instructions below regarding your visit.  Your diagnoses today include:  1. Hemarthrosis    When the INR becomes high, you can get spontaneous bleeding into the knee.  This is likely what happened with your left knee.  Tests performed today include: See side panel of your discharge paperwork for testing performed today. Vital signs are listed at the bottom of these instructions.   Irene Shipper joint effusion -INR-2.33 today  Medications prescribed:    Take any prescribed medications only as prescribed, and any over the counter medications only as directed on the packaging.  Please hold your dose of Coumadin tonight.  Home care instructions:  Please follow any educational materials contained in this packet.   Please apply ice to your left knee multiple times a day.  Please put a towel in between your skin and the knee.  Follow-up instructions: Please follow-up with your primary care provider on Tuesday for further evaluation of your symptoms.   Return instructions:  Please return to the Emergency Department if you experience worsening symptoms.  Please return to the emergency department for any increasing swelling, rebleeding, redness to the knee, fever, chills, nausea, vomiting, numbness or weakness to your left lower leg, swelling, redness, or pallor to your left lower leg. Please return if you have any other emergent concerns.  Additional Information:   Your vital signs today were: BP (!) 145/53    Pulse 75    Temp 97.8 F (36.6 C) (Oral)    Resp 18    SpO2 95%  If your blood pressure (BP) was elevated on multiple readings during this visit above 130 for the top number or above 80 for the bottom number, please have this repeated by your primary care provider within one month. --------------  Thank you for allowing [place] to participate in your care today. It was a pleasure taking care of you!

## 2017-04-19 NOTE — ED Notes (Signed)
Pt states that she needs to have a BM. Pt placed on bedpan at this time

## 2017-04-19 NOTE — ED Provider Notes (Addendum)
[facility] DEPT Provider Note   CSN: [id number] Arrival date & time: 04/19/17  1856     History   Chief Complaint Chief Complaint  Patient presents with  . Knee Pain    left    HPI Samantha Foley is a 83 y.o. female.  HPI   Patient is an 83 year old female with a history of atrial fibrillation and osteoarthritis presenting for acute atraumatic left knee pain for 2 days.  Patient reports that she routinely has problems with this knee with her osteoarthritis, and requires steroid injections every 3 months, however pain of current episode is 10 out of 10 and worse than any prior pain.  Patient reports that pain is all around the knee and nonfocal.  No falls or injury proceeded this knee pain.  Patient noted that it was getting progressively worse yesterday, but noticed swelling on the morning of 04-19-2017 in the superior aspect of the knee.  Patient denies any fever, chills, nausea, vomiting, numbness, weakness, swelling of the left lower extremity, pallor or erythema to the left lower extremity or knee.  Patient denies any chest pain, shortness of breath, abdominal pain, or dysuria.  Patient attempted acetaminophen for her symptoms, which did not relieve the pain.  Patient reports that she had a supra therapeutic INR of 3.2 4 days ago.  Past Medical History:  Diagnosis Date  . Arthritis   . Atrial fibrillation ([place])    reported but not yet documented  . Chronic back pain    Dr. Tonita Cong  . Chronic knee pain    Dr. Rhona Raider  . Colon polyp    Dr. Wynetta Emery  . Dysrhythmia    atrial fib- mild per pt- no meds  . Food allergy    Scallops--no other seafood allergies  . GERD (gastroesophageal reflux disease)   . Hiatal hernia   . Hyperlipidemia   . Impaired glucose tolerance   . Lactose intolerance   . Osteopenia   . PVC's (premature ventricular contractions)   . Seasonal allergies   . Vision problem    wears contacts    Patient Active Problem  List   Diagnosis Date Noted  . Encounter for therapeutic drug monitoring 04/15/2017  . S/P total hysterectomy - Davinci 03/09/2016  . Postoperative state 03/08/2016  . Age-related macular degeneration 06/28/2015  . Disorder of rotator cuff 06/28/2015  . Degenerative drusen 07/11/2014  . Chest pain 12/16/2013  . Shoulder injury 12/18/2012  . Lipoma of neck 02/27/2012  . GERD (gastroesophageal reflux disease) 08/08/2011  . Impaired fasting glucose 08/08/2011  . Pure hypercholesterolemia 08/08/2011  . Osteopenia 08/08/2011  . Atrial fibrillation ([place]) 08/08/2011    Past Surgical History:  Procedure Laterality Date  . BACK SURGERY     several vertbea ae rcemntekd  . ROBOTIC ASSISTED TOTAL HYSTERECTOMY WITH BILATERAL SALPINGO OOPHERECTOMY Bilateral 03/08/2016   Procedure: ROBOTIC ASSISTED TOTAL HYSTERECTOMY WITH BILATERAL SALPINGO OOPHORECTOMY/Uterosacral Ligament Suspension;  Surgeon: Princess Bruins, MD;  Location: [facility];  Service: Gynecology;  Laterality: Bilateral;  . sebaceous cyst excision  2009   neck  . TONSILLECTOMY AND ADENOIDECTOMY  age 84  . VERTEBROPLASTY  05/2009  . WISDOM TOOTH EXTRACTION      OB History    No data available       Home Medications    Prior to Admission medications   Medication Sig Start Date End Date Taking? Authorizing Provider  Cyanocobalamin (VITAMIN B-12 PO) Take 1 tablet by mouth daily.   Yes [provider]  Multiple Vitamins-Minerals (MULTIVITAMIN ADULTS 50+) TABS Take 1 tablet by mouth daily.   Yes [provider]  omeprazole (PRILOSEC) 20 MG capsule Take 20 mg by mouth daily.    Yes [provider]  warfarin (COUMADIN) 1 MG tablet Take as directed by coumadin clinic Patient taking differently: Take 1-1.5 mg by mouth as directed. Take 1 tablet (1 mg) on (Sun, Tues, Thurs, Sat) & Take 1.5 tablets (1.5 mg) on (MWF) 02/28/17  Yes Deboraha Sprang, MD    Family History Family History  Problem Relation Age of  Onset  . Rheumatic fever Mother   . Diabetes Mother   . Heart disease Mother        rheumatic heart disease  . Cancer Sister 16       breast cancer  . Cancer Brother        bladder and prostate cancer  . Migraines Daughter   . Cancer Sister        leukemia at 57; colon cancer in her late 30's--family ?'s dx    Social History Social History  Substance Use Topics  . Smoking status: Never Smoker  . Smokeless tobacco: Never Used  . Alcohol use No     Comment: maybe one glass per year.     Allergies   Ciprofloxacin; Scallops [shellfish allergy]; Apixaban; Demerol; Doxycycline; Milk-related compounds; Penicillins; Rivaroxaban; and Sudafed [pseudoephedrine hcl]   Review of Systems Review of Systems  Constitutional: Negative for chills and fever.  HENT: Negative for congestion, rhinorrhea and sore throat.   Eyes: Negative for visual disturbance.  Respiratory: Negative for cough, chest tightness and shortness of breath.   Cardiovascular: Negative for chest pain and leg swelling.  Gastrointestinal: Negative for abdominal pain, diarrhea, nausea and vomiting.  Genitourinary: Negative for dysuria and flank pain.  Musculoskeletal: Positive for arthralgias and joint swelling. Negative for back pain and myalgias.  Skin: Negative for color change, pallor and rash.  Neurological: Negative for dizziness, syncope, weakness, light-headedness, numbness and headaches.     Physical Exam Updated Vital Signs BP 139/60 (BP Location: Right Arm)   Pulse 79   Temp 97.8 F (36.6 C) (Oral)   Resp 18   SpO2 99%   Physical Exam  Constitutional: She appears well-developed and well-nourished. No distress.  HENT:  Head: Normocephalic and atraumatic.  Eyes: Conjunctivae and EOM are normal.  Neck: Normal range of motion. Neck supple.  Cardiovascular: Normal rate, regular rhythm, S1 normal, S2 normal, normal heart sounds and intact distal pulses.   No murmur heard. Symmetric, 2+ PT pulses  bilaterally.  Pulmonary/Chest: Effort normal and breath sounds normal. She has no wheezes. She has no rales.  Abdominal: Soft. She exhibits no distension. There is no tenderness. There is no guarding.  Musculoskeletal:  Left knee with edema to the superior lateral aspect of the knee joint.  No erythema of the knee joint.  Tenderness to palpation of joint line, and superior to patella.  Minimal tenderness to palpation of medial or lateral joint lines.  Range of motion of left ankle intact.  Range of motion limited due to pain of left knee. VASCULAR: 2+ dorsalis pedis and posterior tibialis pulses Capillary refill < 2 sec, toes warm and well-perfused COMPARTMENTS: Soft, warm, well-perfused No pain with passive extension No parethesias  Neurological: She is alert.  Cranial nerves grossly intact. LOWER EXTREMITY EXAM:  SENSORY: sensation is intact to light touch in:  Superficial peroneal nerve distribution (over dorsum of foot) Deep peroneal nerve  distribution (over first dorsal web space) Sural nerve distribution (over lateral aspect 5th metatarsal) Saphenous nerve distribution (over medial instep)   Skin: Skin is warm and dry. No rash noted. No erythema.  Psychiatric: She has a normal mood and affect. Her behavior is normal. Judgment and thought content normal.  Nursing note and vitals reviewed.    ED Treatments / Results  Labs (all labs ordered are listed, but only abnormal results are displayed) Labs Reviewed  BASIC METABOLIC PANEL  CBC WITH DIFFERENTIAL/PLATELET  PROTIME-INR    EKG  EKG Interpretation None       Radiology No results found.  Procedures .Joint Aspiration/Arthrocentesis Date/Time: 04/20/2017 1:37 PM Performed by: Milton Ferguson Authorized by: Milton Ferguson   Consent:    Consent obtained:  Verbal   Consent given by:  Patient   Risks discussed:  Infection   Alternatives discussed:  Delayed treatment Location:    Location:  Knee Anesthesia (see  MAR for exact dosages):    Anesthesia method:  Local infiltration   Local anesthetic:  Lidocaine 2% WITH epi Procedure details:    Needle gauge:  18 G   Ultrasound guidance: no     Approach:  Medial   Aspirate characteristics:  Bloody   Steroid injected: no     Specimen collected: no   Post-procedure details:    Dressing:  Adhesive bandage   Patient tolerance of procedure:  Tolerated well, no immediate complications Comments:     Patient had her left knee aspirated without problems.  45 cc of blood-tinged fluid was removed   (including critical care time)  Medications Ordered in ED Medications  fentaNYL (SUBLIMAZE) injection 25 mcg (25 mcg Intravenous Given 04/19/17 2027)  sodium chloride 0.9 % bolus 500 mL (500 mLs Intravenous New Bag/Given 04/19/17 2027)     Initial Impression / Assessment and Plan / ED Course  I have reviewed the triage vital signs and the nursing notes.  Pertinent labs & imaging results that were available during my care of the patient were reviewed by me and considered in my medical decision making (see chart for details).     Final Clinical Impressions(s) / ED Diagnoses   Final diagnoses:  None   Patient is well-appearing, afebrile, and in no acute distress at rest.  Patient reports that with slight movement, her pain is 9 out of 10. Differential diagnosis includes hemarthrosis, septic arthritis, synovial effusion, worsening osteoarthritis, gout.  We will seek pain control with Fentanyl, check INR, and get x-rays to assess for effusion.  May proceed with arthrocentesis if indicated.   INR 2.33 today.  No leukocytosis.  X-ray demonstrated medial joint space narrowing consistent with osteoarthritis and a moderate joint effusion.  This is likely hemarthrosis.  Arthrocentesis performed by Dr. Milton Ferguson.  45 mL of pure blood aspirated out of left joint space.  No purulence noted.  Pain controlled with morphine and fentanyl in ED today.  Will discharge  patient with Percocet and close primary care/orthopedic follow-up.  Return precautions given for any increasing swelling, increasing pain, erythema of the knee, fever, chills, nausea, or vomiting.  This is a shared visit with Dr. Milton Ferguson. Patient was independently evaluated by this attending physician. Attending physician consulted in evaluation and discharge management.  New Prescriptions New Prescriptions   No medications on file     Tamala Julian 04/20/17 [id number]    Milton Ferguson, MD 04/20/17 1334    Milton Ferguson, MD 04/20/17 [phone]

## 2017-04-19 NOTE — ED Triage Notes (Signed)
Patient complaining of pain and increased swelling to left knee. Patient reports inability to bear weight on left knee. Patient reports getting cortisone shots in her knee in the past for pain, but when she called her PCP, he stated he could not get her in for a cortisone shot until Tuesday. Palpable pulses to bilateral pedal pulses.

## 2017-04-21 ENCOUNTER — Emergency Department (HOSPITAL_COMMUNITY): Payer: PPO

## 2017-04-21 ENCOUNTER — Emergency Department (HOSPITAL_COMMUNITY)
Admission: EM | Admit: 2017-04-21 | Discharge: 2017-04-21 | Disposition: A | Discharge status: Home / Self Care | Payer: PPO | Attending: Emergency Medicine | Admitting: Emergency Medicine

## 2017-04-21 ENCOUNTER — Encounter (HOSPITAL_COMMUNITY): Payer: Self-pay | Admitting: Emergency Medicine

## 2017-04-21 DIAGNOSIS — M2500 Hemarthrosis, unspecified joint: Secondary | ICD-10-CM

## 2017-04-21 DIAGNOSIS — Z7901 Long term (current) use of anticoagulants: Secondary | ICD-10-CM | POA: Insufficient documentation

## 2017-04-21 DIAGNOSIS — M25062 Hemarthrosis, left knee: Secondary | ICD-10-CM | POA: Insufficient documentation

## 2017-04-21 DIAGNOSIS — M25462 Effusion, left knee: Secondary | ICD-10-CM | POA: Diagnosis not present

## 2017-04-21 DIAGNOSIS — M25562 Pain in left knee: Secondary | ICD-10-CM

## 2017-04-21 LAB — CBC WITH DIFFERENTIAL/PLATELET
BASOS PCT: 1 %
Basophils Absolute: 0 10*3/uL (ref 0.0–0.1)
EOS ABS: 0.1 10*3/uL (ref 0.0–0.7)
Eosinophils Relative: 1 %
HEMATOCRIT: 36 % (ref 36.0–46.0)
Hemoglobin: 11.8 g/dL — ABNORMAL LOW (ref 12.0–15.0)
LYMPHS PCT: 14 %
Lymphs Abs: 1.1 10*3/uL (ref 0.7–4.0)
MCH: 29.4 pg (ref 26.0–34.0)
MCHC: 32.8 g/dL (ref 30.0–36.0)
MCV: 89.8 fL (ref 78.0–100.0)
Monocytes Absolute: 0.8 10*3/uL (ref 0.1–1.0)
Monocytes Relative: 10 %
NEUTROS ABS: 6 10*3/uL (ref 1.7–7.7)
NEUTROS PCT: 74 %
PLATELETS: 192 10*3/uL (ref 150–400)
RBC: 4.01 MIL/uL (ref 3.87–5.11)
RDW: 14.5 % (ref 11.5–15.5)
WBC: 8.1 10*3/uL (ref 4.0–10.5)

## 2017-04-21 LAB — PROTIME-INR
INR: 2.61
Prothrombin Time: 27.7 seconds — ABNORMAL HIGH (ref 11.4–15.2)

## 2017-04-21 MED ORDER — OXYCODONE-ACETAMINOPHEN 5-325 MG PO TABS: 1.0000 | ORAL_TABLET | Freq: Four times a day (QID) | ORAL | 0 refills | Status: DC | PRN

## 2017-04-21 MED ORDER — HYDROMORPHONE HCL 2 MG/ML IJ SOLN
2.0000 mg | Freq: Once | INTRAMUSCULAR | Status: AC
  Administered 2017-04-21: 2 mg via INTRAMUSCULAR
  Filled 2017-04-21: qty 1

## 2017-04-21 NOTE — ED Triage Notes (Addendum)
Patient brought in by [facility] from home for left knee swelling, heat and erythema. Patient was seen here on Friday where had fluid drained from left knee. Patient woke up with pain in foot and ankle pain as well with swelling. Patient denies any new falls or anything that can injury her left leg.

## 2017-04-21 NOTE — Discharge Instructions (Signed)
Please see the information and instructions below regarding your visit.  Your diagnoses today include:  1. Acute pain of left knee   2. Hemarthrosis    Your CT demonstrated some evidence of rebleeding, which can happen after an acute bleed into the knee joint. There is also a Baker's cyst (behind the knee) in the left knee, which is likely chronic. This is a common finding and is a fluid collection in the normal structures of the knee.  I spoke with Dr. Lyla Glassing of [facility] orthopedics, who reports that these these symptoms are consistent with bleeding into the knee. He reported that the pain can get worse before it gets better and the swelling can travel down the leg with gravity.   Tests performed today include: See side panel of your discharge paperwork for testing performed today. Vital signs are listed at the bottom of these instructions.   CT scan of the knee  INR-2.61 today  CBC-Your hemoglobin dropped a small amount in 48 hours, but this is consistent with a bleed into the knee joint.  Medications prescribed:    Take any prescribed medications only as prescribed, and any over the counter medications only as directed on the packaging.  You have been prescribed Percocet for pain. This is an opioid pain medication. You may take this medication every 4-6 hours as needed for pain. Only take this medication if you need it for breakthrough pain.   Do not combine this medication with Tylenol, as it may increase the risk of liver problems.  Do not combine this medication with alcohol.  Please be advised to avoid driving or operating heavy machinery while taking this medication, as it may make you drowsy or impair judgment.    Home care instructions:  Please follow any educational materials contained in this packet.   Please wear your knee immobilizer at all times.  You may take it off to shower or wash the leg.  Please  Ice your knee every couple of hours up to 7-8 times per day.   Please place a towel between the skin and the ice.  You may take the knee immobilizer off to ice her knee.  Follow-up instructions: Please follow-up with your cardiologist regarding your Coumadin and bleeding problems.  Please follow up with Dr. Wynelle Link in 2 days.  Return instructions:  Please return to the Emergency Department if you experience worsening symptoms.  Return to the emergency department for any redness around the knee, increased swelling, fever, chills, or other systemic symptoms such as nausea or vomiting consistent with not feeling well. Please return if you have any other emergent concerns.  Additional Information:   Your vital signs today were: BP 133/76    Pulse 86    Temp 97.9 F (36.6 C) (Oral)    Resp 16    SpO2 97%  If your blood pressure (BP) was elevated on multiple readings during this visit above 130 for the top number or above 80 for the bottom number, please have this repeated by your primary care provider within one month. --------------  Thank you for allowing [place] to participate in your care today.

## 2017-04-21 NOTE — ED Notes (Signed)
Ortho tech at bedside 

## 2017-04-21 NOTE — ED Provider Notes (Signed)
[facility] DEPT Provider Note   CSN: [id number] Arrival date & time: 04/21/17  1150     History   Chief Complaint Chief Complaint  Patient presents with  . Knee Pain  . Joint Swelling  . Ankle Pain    HPI Samantha Foley is a 83 y.o. female.  HPI  Patient is an 83 year old female with a history of atrial fibrillation (on Warfarin), osteoarthritis, GERD, HLD and recent hemarthrosis in the setting of supratherapeutic INR 1 week ago presenting for increased pain in left knee and swelling distal to the left knee.  Patient reports she has had increased swelling in the superior lateral aspect of the left knee following the arthrocentesis, as well as inability to flex or extend the knee.  Patient reports she has been nonweightbearing on the left knee since this time and patient and family were concerned regarding continued pain and pain with weightbearing.  No fever, chills, abdominal pain, nausea, vomiting, chest pain, shortness of breath.  No calf pain.  Patient presented on 04-19-2017 for acute left knee swelling and ultimately had a hemarthrosis with 45 mL of blood removed on arthrocentesis.  No erythema of the knee at that time.  Past Medical History:  Diagnosis Date  . Arthritis   . Atrial fibrillation ([place])    reported but not yet documented  . Chronic back pain    Dr. Tonita Cong  . Chronic knee pain    Dr. Rhona Raider  . Colon polyp    Dr. Wynetta Emery  . Dysrhythmia    atrial fib- mild per pt- no meds  . Food allergy    Scallops--no other seafood allergies  . GERD (gastroesophageal reflux disease)   . Hiatal hernia   . Hyperlipidemia   . Impaired glucose tolerance   . Lactose intolerance   . Osteopenia   . PVC's (premature ventricular contractions)   . Seasonal allergies   . Vision problem    wears contacts    Patient Active Problem List   Diagnosis Date Noted  . Encounter for therapeutic drug monitoring 04/15/2017  . S/P total  hysterectomy - Davinci 03/09/2016  . Postoperative state 03/08/2016  . Age-related macular degeneration 06/28/2015  . Disorder of rotator cuff 06/28/2015  . Degenerative drusen 07/11/2014  . Chest pain 12/16/2013  . Shoulder injury 12/18/2012  . Lipoma of neck 02/27/2012  . GERD (gastroesophageal reflux disease) 08/08/2011  . Impaired fasting glucose 08/08/2011  . Pure hypercholesterolemia 08/08/2011  . Osteopenia 08/08/2011  . Atrial fibrillation ([place]) 08/08/2011    Past Surgical History:  Procedure Laterality Date  . BACK SURGERY     several vertbea ae rcemntekd  . ROBOTIC ASSISTED TOTAL HYSTERECTOMY WITH BILATERAL SALPINGO OOPHERECTOMY Bilateral 03/08/2016   Procedure: ROBOTIC ASSISTED TOTAL HYSTERECTOMY WITH BILATERAL SALPINGO OOPHORECTOMY/Uterosacral Ligament Suspension;  Surgeon: Princess Bruins, MD;  Location: [facility];  Service: Gynecology;  Laterality: Bilateral;  . sebaceous cyst excision  2009   neck  . TONSILLECTOMY AND ADENOIDECTOMY  age 77  . VERTEBROPLASTY  05/2009  . WISDOM TOOTH EXTRACTION      OB History    No data available       Home Medications    Prior to Admission medications   Medication Sig Start Date End Date Taking? Authorizing Provider  Cyanocobalamin (VITAMIN B-12 PO) Take 1 tablet by mouth daily.    [provider]  Multiple Vitamins-Minerals (MULTIVITAMIN ADULTS 50+) TABS Take 1 tablet by mouth daily.    [provider]  omeprazole (  PRILOSEC) 20 MG capsule Take 20 mg by mouth daily.     [provider]  oxyCODONE-acetaminophen (PERCOCET/ROXICET) 5-325 MG tablet Take 1 tablet by mouth every 6 (six) hours as needed for severe pain. 04/19/17   Langston Masker B, PA-C  warfarin (COUMADIN) 1 MG tablet Take as directed by coumadin clinic Patient taking differently: Take 1-1.5 mg by mouth as directed. Take 1 tablet (1 mg) on (Sun, Tues, Thurs, Sat) & Take 1.5 tablets (1.5 mg) on (MWF) 02/28/17   Deboraha Sprang, MD    Family  History Family History  Problem Relation Age of Onset  . Rheumatic fever Mother   . Diabetes Mother   . Heart disease Mother        rheumatic heart disease  . Cancer Sister 28       breast cancer  . Cancer Brother        bladder and prostate cancer  . Migraines Daughter   . Cancer Sister        leukemia at 86; colon cancer in her late 30's--family ?'s dx    Social History Social History  Substance Use Topics  . Smoking status: Never Smoker  . Smokeless tobacco: Never Used  . Alcohol use No     Comment: maybe one glass per year.     Allergies   Ciprofloxacin; Scallops [shellfish allergy]; Apixaban; Demerol; Doxycycline; Milk-related compounds; Penicillins; Rivaroxaban; and Sudafed [pseudoephedrine hcl]   Review of Systems Review of Systems  Constitutional: Negative for chills and fever.  HENT: Negative for congestion, rhinorrhea and sore throat.   Eyes: Negative for visual disturbance.  Respiratory: Negative for cough, chest tightness and shortness of breath.   Cardiovascular: Negative for chest pain, palpitations and leg swelling.  Gastrointestinal: Positive for constipation. Negative for abdominal pain, diarrhea, nausea and vomiting.  Genitourinary: Negative for dysuria.  Musculoskeletal: Positive for arthralgias and joint swelling. Negative for back pain and myalgias.  Skin: Negative for rash.  Neurological: Negative for dizziness, syncope, light-headedness and headaches.     Physical Exam Updated Vital Signs BP (!) 144/70 (BP Location: Left Arm)   Pulse 91   Temp 97.9 F (36.6 C) (Oral)   Resp 19   SpO2 98%   Physical Exam  Constitutional: She appears well-developed and well-nourished. No distress.  HENT:  Head: Normocephalic and atraumatic.  Mouth/Throat: Oropharynx is clear and moist.  Eyes: Conjunctivae and EOM are normal.  Neck: Normal range of motion. Neck supple.  Cardiovascular: Normal rate, regular rhythm, S1 normal, S2 normal, normal heart  sounds and intact distal pulses.   No murmur heard. 2+ and equal DP pulses bilaterally.  Unable to palpate left PT pulse d/t swelling.  Intact, 2+ popliteal pulse on left.  Pulmonary/Chest: Effort normal and breath sounds normal. She has no wheezes. She has no rales.  Abdominal: Soft. She exhibits no distension. There is no tenderness. There is no guarding.  Musculoskeletal:  Left knee slightly more edematous compared to right. No erythema of left knee joint.  Pain to palpation over the patella and superior lateral left knee joint.  Pain to palpation of left calcaneus and Achilles tendon.  Small amount of swelling around medial malleolus of left ankle.  Small amount of ecchymosis accompanying swelling of medial malleolus. Slight erythema of left MTP joint.  No posterior left calf pain.  Neurological: She is alert.  Cranial nerves grossly intact. Patient moves upper extremities with good coordination and symmetrically. ROM of left leg limited d/t pain.  Skin:  Skin is warm and dry. No rash noted. No erythema.  Psychiatric: She has a normal mood and affect. Her behavior is normal. Judgment and thought content normal.  Nursing note and vitals reviewed.    ED Treatments / Results  Labs (all labs ordered are listed, but only abnormal results are displayed) Labs Reviewed  CBC WITH DIFFERENTIAL/PLATELET  PROTIME-INR    EKG  EKG Interpretation None       Radiology Dg Knee Complete 4 Views Left  Result Date: 04/19/2017 CLINICAL DATA:  Chronic generalized left knee pain and swelling. Inability to bear weight today. EXAM: LEFT KNEE - COMPLETE 4+ VIEW COMPARISON:  None. FINDINGS: Exam demonstrates moderate spurring and joint space narrowing of the medial compartment with mild degenerative changes of the patellofemoral joint. Moderate-sized joint effusion. No acute fracture or dislocation. IMPRESSION: Moderate osteoarthritic change most prominent over the medial compartment. Moderate joint  effusion. Electronically Signed   By: Marin Olp M.D.   On: 04/19/2017 20:47    Procedures Procedures (including critical care time)  Medications Ordered in ED Medications  HYDROmorphone (DILAUDID) injection 2 mg (not administered)     Initial Impression / Assessment and Plan / ED Course  I have reviewed the triage vital signs and the nursing notes.  Pertinent labs & imaging results that were available during my care of the patient were reviewed by me and considered in my medical decision making (see chart for details).      Final Clinical Impressions(s) / ED Diagnoses   Final diagnoses:  None   83 year old female status post arthrocentesis for acute hemarthrosis of left knee following supra therapeutic INR presenting for continued pain and decreased range of motion of the left knee.  Will reassess INR, CBC, and obtain CT to assess bony or ligamentous abnormality causing pain. Call placed to patient's [facility] at [facility].  We will order IM Dilaudid for pain control.  Spoke with Dr. Lyla Glassing of [facility]. Recommended ice, compression, elevation, and pain control. Per Dr. Lyla Glassing, patient's symptoms are within normal limits of post-hemarthrosis findings and these bleeds become worse before they get better. Provided INR is therapeutic, patient should be able to maintain regular OP follow up per Dr. Lyla Glassing.  CT scan demonstrates chronic osteoarthritic changes and moderate effusion, consistent with some rebleeding.  Baker's cyst present.  INR therapeutic at 2.61 today. Pain controlled in the emergency department and patient stable for discharge with knee immobilizer and instructions to elevate, ice, and rest until follow-up on Tuesday.  Patient and family are in understanding and agree with the plan of care.  This is a shared visit with Dr. Isla Pence. Patient was independently evaluated by this attending physician. Attending physician  consulted in evaluation and discharge management.  New Prescriptions New Prescriptions   No medications on file     Tamala Julian 04/21/17 [place], Julie, MD 04/21/17 [phone]

## 2017-04-23 ENCOUNTER — Other Ambulatory Visit: Payer: Self-pay | Admitting: Internal Medicine

## 2017-04-23 DIAGNOSIS — G8929 Other chronic pain: Secondary | ICD-10-CM | POA: Diagnosis not present

## 2017-04-23 DIAGNOSIS — M25562 Pain in left knee: Secondary | ICD-10-CM | POA: Diagnosis not present

## 2017-04-23 DIAGNOSIS — M1712 Unilateral primary osteoarthritis, left knee: Secondary | ICD-10-CM | POA: Diagnosis not present

## 2017-04-30 DIAGNOSIS — I4891 Unspecified atrial fibrillation: Secondary | ICD-10-CM | POA: Diagnosis not present

## 2017-05-09 ENCOUNTER — Ambulatory Visit (INDEPENDENT_AMBULATORY_CARE_PROVIDER_SITE_OTHER): Payer: PPO

## 2017-05-09 DIAGNOSIS — Z5181 Encounter for therapeutic drug level monitoring: Secondary | ICD-10-CM | POA: Diagnosis not present

## 2017-05-09 DIAGNOSIS — I4891 Unspecified atrial fibrillation: Secondary | ICD-10-CM | POA: Diagnosis not present

## 2017-05-09 LAB — POCT INR: INR: 2.2

## 2017-05-09 NOTE — Patient Instructions (Signed)
Continue on same dosage 1 tablet everyday except 1.5 tablets on Mondays, Wednesdays, and Fridays. Recheck in 4 weeks. Call with any procedures or new medications [phone]

## 2017-06-13 DIAGNOSIS — L03319 Cellulitis of trunk, unspecified: Secondary | ICD-10-CM | POA: Diagnosis not present

## 2017-06-19 ENCOUNTER — Ambulatory Visit (INDEPENDENT_AMBULATORY_CARE_PROVIDER_SITE_OTHER): Payer: PPO | Admitting: *Deleted

## 2017-06-19 DIAGNOSIS — Z5181 Encounter for therapeutic drug level monitoring: Secondary | ICD-10-CM | POA: Diagnosis not present

## 2017-06-19 DIAGNOSIS — I4891 Unspecified atrial fibrillation: Secondary | ICD-10-CM

## 2017-06-19 LAB — POCT INR: INR: 3.7

## 2017-06-19 NOTE — Patient Instructions (Signed)
Description   Skip today's dose, then Continue on same dosage 1 tablet everyday except 1.5 tablets on Mondays, Wednesdays, and Fridays. Recheck in 2-3 weeks. Call with any procedures or new medications [phone]

## 2017-06-25 DIAGNOSIS — M7122 Synovial cyst of popliteal space [Baker], left knee: Secondary | ICD-10-CM

## 2017-06-25 HISTORY — DX: Synovial cyst of popliteal space (Baker), left knee: M71.22

## 2017-06-28 ENCOUNTER — Other Ambulatory Visit: Payer: Self-pay | Admitting: Obstetrics & Gynecology

## 2017-06-28 ENCOUNTER — Telehealth: Payer: Self-pay | Admitting: Internal Medicine

## 2017-06-28 DIAGNOSIS — N6320 Unspecified lump in the left breast, unspecified quadrant: Secondary | ICD-10-CM

## 2017-06-28 NOTE — Telephone Encounter (Signed)
New Message  Pt call requesting to speak with RN to see if she would need to keep appt for the afib clinic.

## 2017-06-28 NOTE — Telephone Encounter (Signed)
Pts OV with Dr. Caryl Comes in July 2018 says for patient to follow up in 6 months with Doristine Devoid in [facility]. Therefore that is who she needs to be scheduled with

## 2017-06-28 NOTE — Telephone Encounter (Signed)
Returned call to patient. She states she received a recall letter for a 6 month follow up appt with Dr. Caryl Comes. Patient states she would like to schedule appt. Informed patient I would forward to Dr. Olin Pia scheduler to call back to schedule. Patient verbalized understanding and thanked me for the call.

## 2017-07-01 ENCOUNTER — Other Ambulatory Visit: Payer: PPO

## 2017-07-01 ENCOUNTER — Other Ambulatory Visit: Payer: Self-pay | Admitting: Internal Medicine

## 2017-07-01 DIAGNOSIS — I10 Essential (primary) hypertension: Secondary | ICD-10-CM | POA: Diagnosis not present

## 2017-07-01 DIAGNOSIS — I82409 Acute embolism and thrombosis of unspecified deep veins of unspecified lower extremity: Secondary | ICD-10-CM | POA: Diagnosis not present

## 2017-07-01 DIAGNOSIS — R52 Pain, unspecified: Secondary | ICD-10-CM

## 2017-07-01 DIAGNOSIS — L03319 Cellulitis of trunk, unspecified: Secondary | ICD-10-CM | POA: Diagnosis not present

## 2017-07-02 ENCOUNTER — Ambulatory Visit
Admission: RE | Admit: 2017-07-02 | Discharge: 2017-07-02 | Disposition: A | Discharge status: Home / Self Care | Payer: PPO | Source: Ambulatory Visit | Attending: Internal Medicine | Admitting: Internal Medicine

## 2017-07-02 DIAGNOSIS — R52 Pain, unspecified: Secondary | ICD-10-CM

## 2017-07-02 DIAGNOSIS — M7989 Other specified soft tissue disorders: Secondary | ICD-10-CM | POA: Diagnosis not present

## 2017-07-05 DIAGNOSIS — M7122 Synovial cyst of popliteal space [Baker], left knee: Secondary | ICD-10-CM | POA: Diagnosis not present

## 2017-07-05 DIAGNOSIS — M1712 Unilateral primary osteoarthritis, left knee: Secondary | ICD-10-CM | POA: Diagnosis not present

## 2017-07-11 ENCOUNTER — Ambulatory Visit
Admission: RE | Admit: 2017-07-11 | Discharge: 2017-07-11 | Disposition: A | Discharge status: Home / Self Care | Payer: PPO | Source: Ambulatory Visit | Attending: Obstetrics & Gynecology | Admitting: Obstetrics & Gynecology

## 2017-07-11 DIAGNOSIS — N6320 Unspecified lump in the left breast, unspecified quadrant: Secondary | ICD-10-CM

## 2017-07-11 DIAGNOSIS — R928 Other abnormal and inconclusive findings on diagnostic imaging of breast: Secondary | ICD-10-CM | POA: Diagnosis not present

## 2017-07-23 ENCOUNTER — Telehealth: Payer: Self-pay | Admitting: Internal Medicine

## 2017-07-23 NOTE — Telephone Encounter (Signed)
I spoke with pt. She is due for follow up in Atrial fib clinic.  I scheduled appointment for her in atrial fib clinic on 08/01/17 at 12:00.

## 2017-07-23 NOTE — Telephone Encounter (Signed)
New message   Patient would like to schedule an appt for a stress test but no order in system Please call

## 2017-07-25 ENCOUNTER — Ambulatory Visit (INDEPENDENT_AMBULATORY_CARE_PROVIDER_SITE_OTHER): Payer: PPO

## 2017-07-25 DIAGNOSIS — Z5181 Encounter for therapeutic drug level monitoring: Secondary | ICD-10-CM

## 2017-07-25 DIAGNOSIS — I4891 Unspecified atrial fibrillation: Secondary | ICD-10-CM | POA: Diagnosis not present

## 2017-07-25 LAB — POCT INR: INR: 3

## 2017-07-25 NOTE — Patient Instructions (Signed)
Description   Continue on same dosage 1 tablet everyday except 1.5 tablets on Mondays, Wednesdays, and Fridays. Recheck in 4 weeks. Call with any procedures or new medications [phone]

## 2017-08-01 ENCOUNTER — Encounter (HOSPITAL_COMMUNITY): Payer: Self-pay | Admitting: Nurse Practitioner

## 2017-08-01 ENCOUNTER — Ambulatory Visit (HOSPITAL_COMMUNITY)
Admission: RE | Admit: 2017-08-01 | Discharge: 2017-08-01 | Disposition: A | Discharge status: Home / Self Care | Payer: PPO | Source: Ambulatory Visit | Attending: Nurse Practitioner | Admitting: Nurse Practitioner

## 2017-08-01 VITALS — BP 122/76 | HR 80 | Ht 62.0 in | Wt 157.0 lb

## 2017-08-01 DIAGNOSIS — Z885 Allergy status to narcotic agent status: Secondary | ICD-10-CM | POA: Diagnosis not present

## 2017-08-01 DIAGNOSIS — Z79891 Long term (current) use of opiate analgesic: Secondary | ICD-10-CM | POA: Diagnosis not present

## 2017-08-01 DIAGNOSIS — Z881 Allergy status to other antibiotic agents status: Secondary | ICD-10-CM | POA: Diagnosis not present

## 2017-08-01 DIAGNOSIS — I480 Paroxysmal atrial fibrillation: Secondary | ICD-10-CM

## 2017-08-01 DIAGNOSIS — Z90710 Acquired absence of both cervix and uterus: Secondary | ICD-10-CM | POA: Insufficient documentation

## 2017-08-01 DIAGNOSIS — Z7901 Long term (current) use of anticoagulants: Secondary | ICD-10-CM | POA: Diagnosis not present

## 2017-08-01 DIAGNOSIS — Z8249 Family history of ischemic heart disease and other diseases of the circulatory system: Secondary | ICD-10-CM | POA: Diagnosis not present

## 2017-08-01 DIAGNOSIS — Z79899 Other long term (current) drug therapy: Secondary | ICD-10-CM | POA: Diagnosis not present

## 2017-08-01 DIAGNOSIS — Z833 Family history of diabetes mellitus: Secondary | ICD-10-CM | POA: Diagnosis not present

## 2017-08-01 DIAGNOSIS — Z9889 Other specified postprocedural states: Secondary | ICD-10-CM | POA: Diagnosis not present

## 2017-08-01 DIAGNOSIS — Z880 Allergy status to penicillin: Secondary | ICD-10-CM | POA: Insufficient documentation

## 2017-08-01 MED ORDER — DILTIAZEM HCL 30 MG PO TABS: ORAL_TABLET | ORAL | 2 refills | Status: DC

## 2017-08-01 NOTE — Progress Notes (Addendum)
Primary Care Physician: Levin Erp, MD Referring Physician:Dr. Jrue Yambao is a 84 y.o. female with a h/o paroxysmal afib fallowed by Dr. Caryl Comes for f/u. She reports that she has low afib burden, just a few brief flutters over the last 6 months.However, she did have a spell of afib that lasted 2 hours last Sunday while in church,. She sat in her car for a while until she felt better then drove home, returned to SR about a hour later. She is on warfarin, followed by coumadin clinic.  Today, she denies symptoms of palpitations, chest pain, shortness of breath, orthopnea, PND, lower extremity edema, dizziness, presyncope, syncope, or neurologic sequela. The patient is tolerating medications without difficulties and is otherwise without complaint today.   Past Medical History:  Diagnosis Date  . Arthritis   . Atrial fibrillation ([place])    reported but not yet documented  . Chronic back pain    Dr. Tonita Cong  . Chronic knee pain    Dr. Rhona Raider  . Colon polyp    Dr. Wynetta [place]y  . Dysrhythmia    atrial fib- mild per pt- no meds  . Food allergy    Scallops--no other seafood allergies  . GERD (gastroesophageal reflux disease)   . Hiatal hernia   . Hyperlipidemia   . Impaired glucose tolerance   . Lactose intolerance   . Osteopenia   . PVC's (premature ventricular contractions)   . Seasonal allergies   . Vision problem    wears contacts   Past Surgical History:  Procedure Laterality Date  . BACK SURGERY     several vertbea ae rcemntekd  . ROBOTIC ASSISTED TOTAL HYSTERECTOMY WITH BILATERAL SALPINGO OOPHERECTOMY Bilateral 03/08/2016   Procedure: ROBOTIC ASSISTED TOTAL HYSTERECTOMY WITH BILATERAL SALPINGO OOPHORECTOMY/Uterosacral Ligament Suspension;  Surgeon: Princess Bruins, MD;  Location: [facility];  Service: Gynecology;  Laterality: Bilateral;  . sebaceous cyst excision  2009   neck  . TONSILLECTOMY AND ADENOIDECTOMY  age 54  . VERTEBROPLASTY  05/2009  . WISDOM TOOTH  EXTRACTION      Current Outpatient Medications  Medication Sig Dispense Refill  . Cyanocobalamin (VITAMIN B-12 PO) Take 1 tablet by mouth daily.    Marland Kitchen omeprazole (PRILOSEC) 20 MG capsule Take 20 mg by mouth daily.     Marland Kitchen oxyCODONE-acetaminophen (PERCOCET/ROXICET) 5-325 MG tablet Take 1 tablet by mouth every 6 (six) hours as needed for severe pain. 10 tablet 0  . warfarin (COUMADIN) 1 MG tablet Take as directed by coumadin clinic (Patient taking differently: Take 1-1.5 mg by mouth as directed. Take 1 tablet (1 mg) on (Sun, Tues, Thurs, Sat) & Take 1.5 tablets (1.5 mg) on (MWF)) 120 tablet 0  . warfarin (COUMADIN) 1 MG tablet TAKE 1-1 1/2 TABLETS DAILY AS DIRECTED PER COUMADIN CLINIC 40 tablet 2  . diltiazem (CARDIZEM) 30 MG tablet Take 1 tablet every 4 hours AS NEEDED for AFIB  heart rate >100 0. 45 tablet 2   No current facility-administered medications for this encounter.     Allergies  Allergen Reactions  . Ciprofloxacin Shortness Of Breath and Rash  . Scallops [Shellfish Allergy] Anaphylaxis  . Apixaban Other (See Comments)    fatigue  . Demerol Other (See Comments)    Patient states it made her crazy and sleep for 12 hours  . Doxycycline Nausea And Vomiting  . Milk-Related Compounds Diarrhea  . Penicillins Other (See Comments)    unknown  . Rivaroxaban Other (See Comments)    Pt unsure  but d/c use  . Sudafed [Pseudoephedrine Hcl] Other (See Comments)    hyperactive    Social History   Socioeconomic History  . Marital status: Married    Spouse name: Not on file  . Number of children: 2  . Years of education: Not on file  . Highest education level: Not on file  Social Needs  . Financial resource strain: Not on file  . Food insecurity - worry: Not on file  . Food insecurity - inability: Not on file  . Transportation needs - medical: Not on file  . Transportation needs - non-medical: Not on file  Occupational History  . Occupation: retired (ran [employer] with her  husband)  Tobacco Use  . Smoking status: Never Smoker  . Smokeless tobacco: Never Used  Substance and Sexual Activity  . Alcohol use: No    Comment: maybe one glass per year.  . Drug use: No  . Sexual activity: Not Currently  Other Topics Concern  . Not on file  Social History Narrative   Lives with her husband.  No pets.  Daughter in [place], [place]; Daughter in [place]. Cares for her 2 grandsons    Family History  Problem Relation Age of Onset  . Rheumatic fever Mother   . Diabetes Mother   . Heart disease Mother        rheumatic heart disease  . Cancer Sister 70       breast cancer  . Cancer Brother        bladder and prostate cancer  . Migraines Daughter   . Cancer Sister        leukemia at 67; colon cancer in her late 30's--family ?'s dx    ROS- All systems are reviewed and negative except as per the HPI above  Physical Exam: Vitals:   08/01/17 1200  BP: 122/76  Pulse: 80  Weight: 157 lb (71.2 kg)  Height: 5\' 2"  (1.575 m)   Wt Readings from Last 3 Encounters:  08/01/17 157 lb (71.2 kg)  01/22/17 156 lb (70.8 kg)  10/09/16 158 lb (71.7 kg)    Labs: Lab Results  Component Value Date   NA 139 04/19/2017   K 3.9 04/19/2017   CL 102 04/19/2017   CO2 27 04/19/2017   GLUCOSE 102 (H) 04/19/2017   BUN 16 04/19/2017   CREATININE 0.69 04/19/2017   CALCIUM 9.4 04/19/2017   MG 2.2 01/13/2017   Lab Results  Component Value Date   INR 3.0 07/25/2017   Lab Results  Component Value Date   CHOL 209 (H) 05/26/2012   HDL 46 05/26/2012   LDLCALC 145 (H) 05/26/2012   TRIG 88 05/26/2012     GEN- The patient is well appearing, alert and oriented x 3 today.   Head- normocephalic, atraumatic Eyes-  Sclera clear, conjunctiva pink Ears- hearing intact Oropharynx- clear Neck- supple, no JVP Lymph- no cervical lymphadenopathy Lungs- Clear to ausculation bilaterally, normal work of breathing Heart- Regular rate and rhythm, no murmurs, rubs or gallops, PMI not laterally  displaced GI- soft, NT, ND, + BS Extremities- no clubbing, cyanosis, or edema MS- no significant deformity or atrophy Skin- no rash or lesion Psych- euthymic mood, full affect Neuro- strength and sensation are intact  EKG-NSR at 80 bpm, pr int 148 ms, qrs int 76 ms, qtc 417 ms Epic records reviewed    Assessment and Plan: 1. Paroxysmal afib  General education re afib discussed So far burden sounds low But will rx Cardizem  30 mg as pill in pocket for episodes that do not break after one hour She understands how to use drug Continue warfarin for chadsvasc score of at least 3  F/u with Dr. Caryl Comes in July  Butch Penny C. Ruvim Risko, [facility] [address] [place], [place] [address] [phone]

## 2017-08-01 NOTE — Patient Instructions (Signed)
Cardizem 30mg  -- take 1 tablet every 4 hours AS NEEDED for AFIB heart rate >100 as long as top number of the blood pressure >100.

## 2017-08-08 DIAGNOSIS — R5383 Other fatigue: Secondary | ICD-10-CM | POA: Diagnosis not present

## 2017-08-12 NOTE — Addendum Note (Signed)
Encounter addended by: Sherran Needs, NP on: 08/12/2017 9:22 AM  Actions taken: LOS modified

## 2017-08-22 ENCOUNTER — Ambulatory Visit (INDEPENDENT_AMBULATORY_CARE_PROVIDER_SITE_OTHER): Payer: PPO | Admitting: *Deleted

## 2017-08-22 DIAGNOSIS — Z5181 Encounter for therapeutic drug level monitoring: Secondary | ICD-10-CM

## 2017-08-22 DIAGNOSIS — I4891 Unspecified atrial fibrillation: Secondary | ICD-10-CM

## 2017-08-22 LAB — POCT INR: INR: 4.4

## 2017-08-22 NOTE — Patient Instructions (Signed)
Description   Do not take any Coumadin today and take 1/2 tablet tomorrow then start taking 1 tablet everyday except 1.5 tablets on Mondays and Fridays. Recheck in 2 weeks. Call with any procedures or new medications [phone]

## 2017-09-05 ENCOUNTER — Ambulatory Visit (INDEPENDENT_AMBULATORY_CARE_PROVIDER_SITE_OTHER): Payer: PPO | Admitting: Pharmacist

## 2017-09-05 DIAGNOSIS — Z5181 Encounter for therapeutic drug level monitoring: Secondary | ICD-10-CM | POA: Diagnosis not present

## 2017-09-05 DIAGNOSIS — I4891 Unspecified atrial fibrillation: Secondary | ICD-10-CM

## 2017-09-05 LAB — POCT INR: INR: 2.7

## 2017-09-05 NOTE — Patient Instructions (Signed)
Description   Continue 1 tablet everyday except 1.5 tablets on Mondays and Fridays. Recheck in 3 weeks. Call with any procedures or new medications [phone]

## 2017-09-11 DIAGNOSIS — M1712 Unilateral primary osteoarthritis, left knee: Secondary | ICD-10-CM | POA: Diagnosis not present

## 2017-09-14 ENCOUNTER — Other Ambulatory Visit: Payer: Self-pay | Admitting: Internal Medicine

## 2017-09-18 DIAGNOSIS — M1712 Unilateral primary osteoarthritis, left knee: Secondary | ICD-10-CM | POA: Diagnosis not present

## 2017-09-26 ENCOUNTER — Ambulatory Visit (INDEPENDENT_AMBULATORY_CARE_PROVIDER_SITE_OTHER): Payer: PPO | Admitting: *Deleted

## 2017-09-26 DIAGNOSIS — M1712 Unilateral primary osteoarthritis, left knee: Secondary | ICD-10-CM | POA: Diagnosis not present

## 2017-09-26 DIAGNOSIS — Z5181 Encounter for therapeutic drug level monitoring: Secondary | ICD-10-CM

## 2017-09-26 DIAGNOSIS — I4891 Unspecified atrial fibrillation: Secondary | ICD-10-CM

## 2017-09-26 LAB — POCT INR: INR: 3.2

## 2017-09-26 NOTE — Patient Instructions (Signed)
Description   Today take 0.5mg  (1/2 tablet) then continue taking  1 tablet everyday except 1.5 tablets on Mondays and Fridays. Recheck in 3 weeks. Call with any procedures or new medications [phone]

## 2017-11-06 ENCOUNTER — Ambulatory Visit (INDEPENDENT_AMBULATORY_CARE_PROVIDER_SITE_OTHER): Payer: PPO | Admitting: *Deleted

## 2017-11-06 DIAGNOSIS — I4891 Unspecified atrial fibrillation: Secondary | ICD-10-CM

## 2017-11-06 DIAGNOSIS — Z5181 Encounter for therapeutic drug level monitoring: Secondary | ICD-10-CM | POA: Diagnosis not present

## 2017-11-06 LAB — POCT INR: INR: 2.7

## 2017-11-06 NOTE — Patient Instructions (Signed)
Description   Continue taking 1 tablet everyday except 1.5 tablets on Mondays and Fridays. Recheck in 4 weeks. Call with any procedures or new medications [phone]

## 2017-11-07 DIAGNOSIS — M1712 Unilateral primary osteoarthritis, left knee: Secondary | ICD-10-CM | POA: Diagnosis not present

## 2017-12-05 ENCOUNTER — Ambulatory Visit (INDEPENDENT_AMBULATORY_CARE_PROVIDER_SITE_OTHER): Payer: PPO | Admitting: *Deleted

## 2017-12-05 DIAGNOSIS — I4891 Unspecified atrial fibrillation: Secondary | ICD-10-CM

## 2017-12-05 DIAGNOSIS — Z5181 Encounter for therapeutic drug level monitoring: Secondary | ICD-10-CM

## 2017-12-05 LAB — POCT INR: INR: 3.2 — AB (ref 2.0–3.0)

## 2017-12-05 NOTE — Patient Instructions (Addendum)
Description   Do not take coumadin today June 13th then continue taking 1 tablet everyday except 1.5 tablets on Mondays and Fridays. Recheck in 2 weeks. Call with any procedures or new medications [phone]  Decrease intake of greens by 1 serving

## 2017-12-12 DIAGNOSIS — M1990 Unspecified osteoarthritis, unspecified site: Secondary | ICD-10-CM | POA: Diagnosis not present

## 2017-12-12 DIAGNOSIS — G4700 Insomnia, unspecified: Secondary | ICD-10-CM | POA: Diagnosis not present

## 2017-12-12 DIAGNOSIS — I4891 Unspecified atrial fibrillation: Secondary | ICD-10-CM | POA: Diagnosis not present

## 2017-12-23 ENCOUNTER — Ambulatory Visit: Payer: PPO | Admitting: *Deleted

## 2017-12-23 DIAGNOSIS — I4891 Unspecified atrial fibrillation: Secondary | ICD-10-CM

## 2017-12-23 DIAGNOSIS — Z5181 Encounter for therapeutic drug level monitoring: Secondary | ICD-10-CM

## 2017-12-23 LAB — POCT INR: INR: 2.9 (ref 2.0–3.0)

## 2017-12-23 NOTE — Patient Instructions (Signed)
Description   Continue taking 1 tablet everyday except 1.5 tablets on Mondays and Fridays. Recheck in 3 weeks. Call with any procedures or new medications [phone]

## 2018-01-09 DIAGNOSIS — G4700 Insomnia, unspecified: Secondary | ICD-10-CM | POA: Diagnosis not present

## 2018-01-21 DIAGNOSIS — R0602 Shortness of breath: Secondary | ICD-10-CM | POA: Diagnosis not present

## 2018-01-21 DIAGNOSIS — R5383 Other fatigue: Secondary | ICD-10-CM | POA: Diagnosis not present

## 2018-01-21 DIAGNOSIS — R5382 Chronic fatigue, unspecified: Secondary | ICD-10-CM | POA: Diagnosis not present

## 2018-02-12 DIAGNOSIS — N814 Uterovaginal prolapse, unspecified: Secondary | ICD-10-CM | POA: Diagnosis not present

## 2018-02-12 DIAGNOSIS — I4891 Unspecified atrial fibrillation: Secondary | ICD-10-CM | POA: Diagnosis not present

## 2018-02-12 DIAGNOSIS — R030 Elevated blood-pressure reading, without diagnosis of hypertension: Secondary | ICD-10-CM | POA: Diagnosis not present

## 2018-02-12 DIAGNOSIS — R5382 Chronic fatigue, unspecified: Secondary | ICD-10-CM | POA: Diagnosis not present

## 2018-02-12 DIAGNOSIS — I10 Essential (primary) hypertension: Secondary | ICD-10-CM | POA: Diagnosis not present

## 2018-02-13 ENCOUNTER — Ambulatory Visit (INDEPENDENT_AMBULATORY_CARE_PROVIDER_SITE_OTHER): Payer: PPO | Admitting: Pharmacist

## 2018-02-13 ENCOUNTER — Encounter: Payer: Self-pay | Admitting: Internal Medicine

## 2018-02-13 ENCOUNTER — Ambulatory Visit (INDEPENDENT_AMBULATORY_CARE_PROVIDER_SITE_OTHER): Payer: PPO | Admitting: Internal Medicine

## 2018-02-13 VITALS — BP 132/86 | HR 81 | Ht 62.0 in | Wt 158.0 lb

## 2018-02-13 DIAGNOSIS — I480 Paroxysmal atrial fibrillation: Secondary | ICD-10-CM

## 2018-02-13 DIAGNOSIS — I4891 Unspecified atrial fibrillation: Secondary | ICD-10-CM | POA: Diagnosis not present

## 2018-02-13 DIAGNOSIS — Z5181 Encounter for therapeutic drug level monitoring: Secondary | ICD-10-CM

## 2018-02-13 LAB — POCT INR: INR: 2.1 (ref 2.0–3.0)

## 2018-02-13 NOTE — Patient Instructions (Signed)
Description   Continue taking 1 tablet everyday except 1.5 tablets on Mondays and Fridays. Recheck in 3 weeks. Call with any procedures or new medications [phone]. Call with the name of your new medication that your doctor prescribed.

## 2018-02-13 NOTE — Progress Notes (Signed)
Patient Care Team: Levin Erp, MD as PCP - General (Internal Medicine)   HPI  Samantha Foley is a 84 y.o. female Seen in followup for paroxysmal atrial fibrillation of long-standing     Overall she feels like she is doing pretty well.  She has infrequent palpitations.  No chest pain or shortness of breath.  Still some fatigue. No bleeding    Has been a recently challenging time with loneliness following the death of her husband 09-03-14  He called her [name]  She met him when she was 89     Past Medical History:  Diagnosis Date  . Arthritis   . Atrial fibrillation ([place])    reported but not yet documented  . Chronic back pain    Dr. Tonita Cong  . Chronic knee pain    Dr. Rhona Raider  . Colon polyp    Dr. Wynetta Emer[place]  . Dysrhythmia    atrial fib- mild per pt- no meds  . Food allergy    Scallops--no other seafood allergies  . GERD (gastroesophageal reflux disease)   . Hiatal hernia   . Hyperlipidemia   . Impaired glucose tolerance   . Lactose intolerance   . Osteopenia   . PVC's (premature ventricular contractions)   . Seasonal allergies   . Vision problem    wears contacts    Past Surgical History:  Procedure Laterality Date  . BACK SURGERY     several vertbea ae rcemntekd  . ROBOTIC ASSISTED TOTAL HYSTERECTOMY WITH BILATERAL SALPINGO OOPHERECTOMY Bilateral 03/08/2016   Procedure: ROBOTIC ASSISTED TOTAL HYSTERECTOMY WITH BILATERAL SALPINGO OOPHORECTOMY/Uterosacral Ligament Suspension;  Surgeon: Princess Bruins, MD;  Location: [facility];  Service: Gynecology;  Laterality: Bilateral;  . sebaceous cyst excision  Sep 03, 2007   neck  . TONSILLECTOMY AND ADENOIDECTOMY  age 72  . VERTEBROPLASTY  05/2009  . WISDOM TOOTH EXTRACTION      Current Outpatient Medications  Medication Sig Dispense Refill  . Cyanocobalamin (VITAMIN B-12 PO) Take 1 tablet by mouth daily.    Marland Kitchen diltiazem (CARDIZEM) 30 MG tablet Take 1 tablet every 4 hours AS NEEDED for AFIB  heart rate >100 0. 45 tablet  2  . Glucosamine-Chondroitin (GLUCOSAMINE CHONDR COMPLEX PO) Take 1 tablet by mouth daily. 1572m glucosamine, 12080mchondroitin    . omeprazole (PRILOSEC) 20 MG capsule Take 20 mg by mouth daily.     . Marland KitchenxyCODONE-acetaminophen (PERCOCET/ROXICET) 5-325 MG tablet Take 1 tablet by mouth every 6 (six) hours as needed for severe pain. 10 tablet 0  . warfarin (COUMADIN) 1 MG tablet TAKE 1-1 1/2 TABLETS DAILY AS DIRECTED PER COUMADIN CLINIC 40 tablet 3   No current facility-administered medications for this visit.     Allergies  Allergen Reactions  . Ciprofloxacin Shortness Of Breath and Rash  . Scallops [Shellfish Allergy] Anaphylaxis  . Apixaban Other (See Comments)    fatigue  . Demerol Other (See Comments)    Patient states it made her crazy and sleep for 12 hours  . Doxycycline Nausea And Vomiting  . Milk-Related Compounds Diarrhea  . Penicillins Other (See Comments)    unknown  . Rivaroxaban Other (See Comments)    Pt unsure but d/c use  . Sudafed [Pseudoephedrine Hcl] Other (See Comments)    hyperactive    Review of Systems negative except from HPI and PMH  Physical Exam BP 132/86   Pulse 81   Ht '5\' 2"'  (1.575 m)   Wt 158 lb (71.7 kg)  SpO2 96%   BMI 28.90 kg/m  Well developed and nourished in no acute distress HENT normal Neck supple with JVP-flat Clear Regular rate and rhythm, no murmurs or gallops Abd-soft with active BS No Clubbing cyanosis edema Skin-warm and dry A & Oriented  Grossly normal sensory and motor function   ECG sinus @ 81 15/08/37 O/w normal  Assessment and Plan   Paroxysmal atrial fibrillation   Scant intermittent afib  On Anticoagulation;  No bleeding issues   Struggling with loss of her husband still and loneliness

## 2018-02-13 NOTE — Patient Instructions (Signed)

## 2018-02-17 DIAGNOSIS — H903 Sensorineural hearing loss, bilateral: Secondary | ICD-10-CM | POA: Diagnosis not present

## 2018-02-17 DIAGNOSIS — H9113 Presbycusis, bilateral: Secondary | ICD-10-CM | POA: Diagnosis not present

## 2018-04-04 ENCOUNTER — Ambulatory Visit (INDEPENDENT_AMBULATORY_CARE_PROVIDER_SITE_OTHER): Payer: PPO | Admitting: Pharmacist Clinician (PhC)/ Clinical Pharmacy Specialist

## 2018-04-04 DIAGNOSIS — I4891 Unspecified atrial fibrillation: Secondary | ICD-10-CM

## 2018-04-04 DIAGNOSIS — Z5181 Encounter for therapeutic drug level monitoring: Secondary | ICD-10-CM

## 2018-04-04 LAB — POCT INR: INR: 2.9 (ref 2.0–3.0)

## 2018-04-04 NOTE — Patient Instructions (Signed)
Description   Continue taking 1 tablet everyday except 1.5 tablets on Mondays and Fridays. Recheck in 3 weeks. Call with any procedures or new medications [phone].      

## 2018-04-10 DIAGNOSIS — M1711 Unilateral primary osteoarthritis, right knee: Secondary | ICD-10-CM | POA: Diagnosis not present

## 2018-04-21 DIAGNOSIS — M1712 Unilateral primary osteoarthritis, left knee: Secondary | ICD-10-CM | POA: Diagnosis not present

## 2018-04-29 ENCOUNTER — Telehealth: Payer: Self-pay | Admitting: Cardiology

## 2018-04-29 NOTE — Telephone Encounter (Signed)
Received a page via the answering service from patient stating that she had noted a fast heart rate for two hours.   Patient reports a history of paroxsymal atrial fibrillation and that her current episode of tachycardia was fast and irregular typical of her prior episodes.   At the time of our phone call, Ms. Cunning stated that her heart rate had slowed to the 90s but remained irregular.   She denied chest pain, lightheadedness, or dizziness. She did state that she felt a bit more short of breath than usual, but that this was typical (again) of her prior episodes of atrial fibrillation.   Given her worsening dyspnea, evaluation in the E.D. was offered, but patient declined at this time stating that if her symptoms worsen tonight she will present for further evaluation.   She requested that I alert Dr. Caryl Comes of her return to AF, and for a member of his office to call to schedule and appointment.   Lauren K. Marletta Lor, MD

## 2018-04-30 DIAGNOSIS — M1712 Unilateral primary osteoarthritis, left knee: Secondary | ICD-10-CM | POA: Diagnosis not present

## 2018-05-01 NOTE — Telephone Encounter (Signed)
Patient Samantha Foley need follow up in AF clinic.

## 2018-05-01 NOTE — Telephone Encounter (Signed)
Spoke to patient and arranged her an OV at the A fib clinic per Dr Curt Bears Monday 11/11 at 3 pm with parking instructions.  I told her that if symptoms worsen, she will need to go to the ED.  She verbalized understanding.

## 2018-05-05 ENCOUNTER — Encounter (HOSPITAL_COMMUNITY): Payer: Self-pay | Admitting: Nurse Practitioner

## 2018-05-05 ENCOUNTER — Ambulatory Visit (HOSPITAL_COMMUNITY)
Admission: RE | Admit: 2018-05-05 | Discharge: 2018-05-05 | Disposition: A | Discharge status: Home / Self Care | Payer: PPO | Source: Ambulatory Visit | Attending: Nurse Practitioner | Admitting: Nurse Practitioner

## 2018-05-05 VITALS — BP 114/68 | HR 68 | Ht 62.0 in | Wt 159.0 lb

## 2018-05-05 DIAGNOSIS — Z90710 Acquired absence of both cervix and uterus: Secondary | ICD-10-CM | POA: Insufficient documentation

## 2018-05-05 DIAGNOSIS — K449 Diaphragmatic hernia without obstruction or gangrene: Secondary | ICD-10-CM | POA: Diagnosis not present

## 2018-05-05 DIAGNOSIS — Z79899 Other long term (current) drug therapy: Secondary | ICD-10-CM | POA: Diagnosis not present

## 2018-05-05 DIAGNOSIS — R002 Palpitations: Secondary | ICD-10-CM | POA: Diagnosis not present

## 2018-05-05 DIAGNOSIS — I4891 Unspecified atrial fibrillation: Secondary | ICD-10-CM | POA: Diagnosis present

## 2018-05-05 DIAGNOSIS — Z7901 Long term (current) use of anticoagulants: Secondary | ICD-10-CM | POA: Diagnosis not present

## 2018-05-05 DIAGNOSIS — Z885 Allergy status to narcotic agent status: Secondary | ICD-10-CM | POA: Diagnosis not present

## 2018-05-05 DIAGNOSIS — I480 Paroxysmal atrial fibrillation: Secondary | ICD-10-CM | POA: Diagnosis not present

## 2018-05-05 DIAGNOSIS — E785 Hyperlipidemia, unspecified: Secondary | ICD-10-CM | POA: Insufficient documentation

## 2018-05-05 DIAGNOSIS — M8580 Other specified disorders of bone density and structure, unspecified site: Secondary | ICD-10-CM | POA: Insufficient documentation

## 2018-05-05 DIAGNOSIS — Z880 Allergy status to penicillin: Secondary | ICD-10-CM | POA: Insufficient documentation

## 2018-05-05 DIAGNOSIS — Z881 Allergy status to other antibiotic agents status: Secondary | ICD-10-CM | POA: Insufficient documentation

## 2018-05-05 DIAGNOSIS — K219 Gastro-esophageal reflux disease without esophagitis: Secondary | ICD-10-CM | POA: Insufficient documentation

## 2018-05-05 DIAGNOSIS — Z8249 Family history of ischemic heart disease and other diseases of the circulatory system: Secondary | ICD-10-CM | POA: Diagnosis not present

## 2018-05-05 MED ORDER — METOPROLOL TARTRATE 25 MG PO TABS: 12.5000 mg | ORAL_TABLET | Freq: Two times a day (BID) | ORAL | 3 refills | Status: DC

## 2018-05-05 NOTE — Patient Instructions (Signed)
Start metoprolol 1/2 tablet twice a day   

## 2018-05-05 NOTE — Progress Notes (Signed)
Primary Care Physician: Levin Erp, MD Referring Physician:Dr. Ozetta Flatley is a 84 y.o. female with a h/o paroxysmal afib fallowed by Dr. Caryl Comes for f/u. She reports that she has low afib burden, just a few brief flutters over the last 6 months.However, she did have a spell of afib that lasted 2 hours last Sunday while in church,. She sat in her car for a while until she felt better then drove home, returned to SR about a hour later. She is on warfarin, followed by coumadin clinic.  Seeing pt in afib clinic 11/11, for irregular heart beat last week. She noticed  about every evening for one week. Usually noted in the evening when she watched TV. But by her description it sounds more like premature contractions. She thought these were very bad for her heart and might  shorten her life, therefore it caused her great concern.. She is on warfarin.   Today, she denies symptoms of palpitations, chest pain, shortness of breath, orthopnea, PND, lower extremity edema, dizziness, presyncope, syncope, or neurologic sequela. The patient is tolerating medications without difficulties and is otherwise without complaint today.   Past Medical History:  Diagnosis Date  . Arthritis   . Atrial fibrillation ([place])    reported but not yet documented  . Chronic back pain    Dr. Tonita Cong  . Chronic knee pain    Dr. Rhona Raider  . Colon polyp    Dr. Wynetta Emery  . Dysrhythmia    atrial fib- mild per pt- no meds  . Food allergy    Scallops--no other seafood allergies  . GERD (gastroesophageal reflux disease)   . Hiatal hernia   . Hyperlipidemia   . Impaired glucose tolerance   . Lactose intolerance   . Osteopenia   . PVC's (premature ventricular contractions)   . Seasonal allergies   . Vision problem    wears contacts   Past Surgical History:  Procedure Laterality Date  . BACK SURGERY     several vertbea ae rcemntekd  . ROBOTIC ASSISTED TOTAL HYSTERECTOMY WITH BILATERAL SALPINGO OOPHERECTOMY  Bilateral 03/08/2016   Procedure: ROBOTIC ASSISTED TOTAL HYSTERECTOMY WITH BILATERAL SALPINGO OOPHORECTOMY/Uterosacral Ligament Suspension;  Surgeon: Princess Bruins, MD;  Location: [facility];  Service: Gynecology;  Laterality: Bilateral;  . sebaceous cyst excision  2009   neck  . TONSILLECTOMY AND ADENOIDECTOMY  age 62  . VERTEBROPLASTY  05/2009  . WISDOM TOOTH EXTRACTION      Current Outpatient Medications  Medication Sig Dispense Refill  . Cyanocobalamin (VITAMIN B-12 PO) Take 1 tablet by mouth daily.    . Glucosamine-Chondroitin (GLUCOSAMINE CHONDR COMPLEX PO) Take 1 tablet by mouth daily. 1500mg  glucosamine, 1200mg  chondroitin    . omeprazole (PRILOSEC) 20 MG capsule Take 20 mg by mouth daily.     Marland Kitchen oxyCODONE-acetaminophen (PERCOCET/ROXICET) 5-325 MG tablet Take 1 tablet by mouth every 6 (six) hours as needed for severe pain. 10 tablet 0  . warfarin (COUMADIN) 1 MG tablet TAKE 1-1 1/2 TABLETS DAILY AS DIRECTED PER COUMADIN CLINIC 40 tablet 3  . diltiazem (CARDIZEM) 30 MG tablet Take 1 tablet every 4 hours AS NEEDED for AFIB  heart rate >100 0. (Patient not taking: Reported on 05/05/2018) 45 tablet 2  . metoprolol tartrate (LOPRESSOR) 25 MG tablet Take 0.5 tablets (12.5 mg total) by mouth 2 (two) times daily. 30 tablet 3   No current facility-administered medications for this encounter.     Allergies  Allergen Reactions  . Ciprofloxacin Shortness Of  Breath and Rash  . Scallops [Shellfish Allergy] Anaphylaxis  . Apixaban Other (See Comments)    fatigue  . Demerol Other (See Comments)    Patient states it made her crazy and sleep for 12 hours  . Doxycycline Nausea And Vomiting  . Milk-Related Compounds Diarrhea  . Penicillins Other (See Comments)    unknown  . Rivaroxaban Other (See Comments)    Pt unsure but d/c use  . Sudafed [Pseudoephedrine Hcl] Other (See Comments)    hyperactive    Social History   Socioeconomic History  . Marital status: Married    Spouse name: Not  on file  . Number of children: 2  . Years of education: Not on file  . Highest education level: Not on file  Occupational History  . Occupation: retired (ran [employer] with her husband)  Social Needs  . Financial resource strain: Not on file  . Food insecurity:    Worry: Not on file    Inability: Not on file  . Transportation needs:    Medical: Not on file    Non-medical: Not on file  Tobacco Use  . Smoking status: Never Smoker  . Smokeless tobacco: Never Used  Substance and Sexual Activity  . Alcohol use: No    Comment: maybe one glass per year.  . Drug use: No  . Sexual activity: Not Currently  Lifestyle  . Physical activity:    Days per week: Not on file    Minutes per session: Not on file  . Stress: Not on file  Relationships  . Social connections:    Talks on phone: Not on file    Gets together: Not on file    Attends religious service: Not on file    Active member of club or organization: Not on file    Attends meetings of clubs or organizations: Not on file    Relationship status: Not on file  . Intimate partner violence:    Fear of current or ex partner: Not on file    Emotionally abused: Not on file    Physically abused: Not on file    Forced sexual activity: Not on file  Other Topics Concern  . Not on file  Social History Narrative   Lives with her husband.  No pets.  Daughter in [place], [place]; Daughter in [place]. Cares for her 2 grandsons    Family History  Problem Relation Age of Onset  . Rheumatic fever Mother   . Diabetes Mother   . Heart disease Mother        rheumatic heart disease  . Cancer Sister 21       breast cancer  . Cancer Brother        bladder and prostate cancer  . Migraines Daughter   . Cancer Sister        leukemia at 16; colon cancer in her late 30's--family ?'s dx    ROS- All systems are reviewed and negative except as per the HPI above  Physical Exam: Vitals:   05/05/18 1526  BP: 114/68  Pulse: 68  Weight: 72.1 kg    Height: 5\' 2"  (1.575 m)   Wt Readings from Last 3 Encounters:  05/05/18 72.1 kg  02/13/18 71.7 kg  08/01/17 71.2 kg    Labs: Lab Results  Component Value Date   NA 139 04/19/2017   K 3.9 04/19/2017   CL 102 04/19/2017   CO2 27 04/19/2017   GLUCOSE 102 (H) 04/19/2017   BUN  16 04/19/2017   CREATININE 0.69 04/19/2017   CALCIUM 9.4 04/19/2017   MG 2.2 01/13/2017   Lab Results  Component Value Date   INR 2.9 04/04/2018   Lab Results  Component Value Date   CHOL 209 (H) 05/26/2012   HDL 46 05/26/2012   LDLCALC 145 (H) 05/26/2012   TRIG 88 05/26/2012     GEN- The patient is well appearing, alert and oriented x 3 today.   Head- normocephalic, atraumatic Eyes-  Sclera clear, conjunctiva pink Ears- hearing intact Oropharynx- clear Neck- supple, no JVP Lymph- no cervical lymphadenopathy Lungs- Clear to ausculation bilaterally, normal work of breathing Heart- Regular rate and rhythm, no murmurs, rubs or gallops, PMI not laterally displaced GI- soft, NT, ND, + BS Extremities- no clubbing, cyanosis, or edema MS- no significant deformity or atrophy Skin- no rash or lesion Psych- euthymic mood, full affect Neuro- strength and sensation are intact  EKG-NSR at 68  bpm, pr int 164 ms, qrs int 68 ms, qtc 412 ms Epic records reviewed    Assessment and Plan: 1. Paroxysmal afib /plapitations By pt's description, it sounds more like premature contractions vrs afib, as she will notice a skip in her pulse, longest episode was 4 hours recently Notices more when she is quiet in the evenings Will place on low dose daily BB, metoprolol tartrate 25 mg 1/2 tab bid  Offered a monitor but she declined today  Continue warfarin for chadsvasc score of at least 3  Will see back in 7-10 days   Butch Penny C. , [facility] [address] [place], [place] [address] [phone]

## 2018-05-14 ENCOUNTER — Other Ambulatory Visit: Payer: Self-pay | Admitting: *Deleted

## 2018-05-15 ENCOUNTER — Ambulatory Visit (HOSPITAL_COMMUNITY): Payer: PPO | Admitting: Nurse Practitioner

## 2018-05-15 DIAGNOSIS — M7711 Lateral epicondylitis, right elbow: Secondary | ICD-10-CM | POA: Diagnosis not present

## 2018-05-15 DIAGNOSIS — M7541 Impingement syndrome of right shoulder: Secondary | ICD-10-CM | POA: Diagnosis not present

## 2018-05-15 DIAGNOSIS — M25521 Pain in right elbow: Secondary | ICD-10-CM | POA: Diagnosis not present

## 2018-05-29 ENCOUNTER — Other Ambulatory Visit: Payer: Self-pay | Admitting: *Deleted

## 2018-05-29 ENCOUNTER — Encounter (HOSPITAL_COMMUNITY): Payer: Self-pay | Admitting: Nurse Practitioner

## 2018-05-29 ENCOUNTER — Ambulatory Visit (HOSPITAL_COMMUNITY)
Admission: RE | Admit: 2018-05-29 | Discharge: 2018-05-29 | Disposition: A | Discharge status: Home / Self Care | Payer: PPO | Source: Ambulatory Visit | Attending: Nurse Practitioner | Admitting: Nurse Practitioner

## 2018-05-29 VITALS — BP 118/64 | HR 67 | Ht 62.0 in | Wt 158.0 lb

## 2018-05-29 DIAGNOSIS — Z7901 Long term (current) use of anticoagulants: Secondary | ICD-10-CM | POA: Insufficient documentation

## 2018-05-29 DIAGNOSIS — Z885 Allergy status to narcotic agent status: Secondary | ICD-10-CM | POA: Diagnosis not present

## 2018-05-29 DIAGNOSIS — Z880 Allergy status to penicillin: Secondary | ICD-10-CM | POA: Insufficient documentation

## 2018-05-29 DIAGNOSIS — I480 Paroxysmal atrial fibrillation: Secondary | ICD-10-CM | POA: Insufficient documentation

## 2018-05-29 DIAGNOSIS — M1990 Unspecified osteoarthritis, unspecified site: Secondary | ICD-10-CM | POA: Insufficient documentation

## 2018-05-29 DIAGNOSIS — E785 Hyperlipidemia, unspecified: Secondary | ICD-10-CM | POA: Insufficient documentation

## 2018-05-29 DIAGNOSIS — K219 Gastro-esophageal reflux disease without esophagitis: Secondary | ICD-10-CM | POA: Diagnosis not present

## 2018-05-29 DIAGNOSIS — Z8249 Family history of ischemic heart disease and other diseases of the circulatory system: Secondary | ICD-10-CM | POA: Diagnosis not present

## 2018-05-29 DIAGNOSIS — G8929 Other chronic pain: Secondary | ICD-10-CM | POA: Insufficient documentation

## 2018-05-29 DIAGNOSIS — M8580 Other specified disorders of bone density and structure, unspecified site: Secondary | ICD-10-CM | POA: Insufficient documentation

## 2018-05-29 DIAGNOSIS — Z881 Allergy status to other antibiotic agents status: Secondary | ICD-10-CM | POA: Insufficient documentation

## 2018-05-29 DIAGNOSIS — Z79899 Other long term (current) drug therapy: Secondary | ICD-10-CM | POA: Insufficient documentation

## 2018-05-29 NOTE — Progress Notes (Signed)
Primary Care Physician: Levin Erp, MD Referring Physician:Dr. Mabelle Mungin is a 84 y.o. female with a h/o paroxysmal afib fallowed by Dr. Caryl Comes for f/u. She reports that she has low afib burden, just a few brief flutters over the last 6 months.However, she did have a spell of afib that lasted 2 hours one Sunday while in church,. She sat in her car for a while until she felt better then drove home, returned to SR about a hour later. She is on warfarin, followed by coumadin clinic.  F/u in afib clinic 12/5, at pt's request. She had an epiosde of afib that lasted around 2 hours last night. Could not remember how to use her 30 mg cardizem tablet. Ask to come in and review instructions. She is in SR. She doe not have a BP cuff at home and encouraged to do so  Today, she denies symptoms of palpitations, chest pain, shortness of breath, orthopnea, PND, lower extremity edema, dizziness, presyncope, syncope, or neurologic sequela. The patient is tolerating medications without difficulties and is otherwise without complaint today.   Past Medical History:  Diagnosis Date  . Arthritis   . Atrial fibrillation ([place])    reported but not yet documented  . Chronic back pain    Dr. Tonita Cong  . Chronic knee pain    Dr. Rhona Raider  . Colon polyp    Dr. Wynetta Emery  . Dysrhythmia    atrial fib- mild per pt- no meds  . Food allergy    Scallops--no other seafood allergies  . GERD (gastroesophageal reflux disease)   . Hiatal hernia   . Hyperlipidemia   . Impaired glucose tolerance   . Lactose intolerance   . Osteopenia   . PVC's (premature ventricular contractions)   . Seasonal allergies   . Vision problem    wears contacts   Past Surgical History:  Procedure Laterality Date  . BACK SURGERY     several vertbea ae rcemntekd  . ROBOTIC ASSISTED TOTAL HYSTERECTOMY WITH BILATERAL SALPINGO OOPHERECTOMY Bilateral 03/08/2016   Procedure: ROBOTIC ASSISTED TOTAL HYSTERECTOMY WITH BILATERAL SALPINGO  OOPHORECTOMY/Uterosacral Ligament Suspension;  Surgeon: Princess Bruins, MD;  Location: [facility];  Service: Gynecology;  Laterality: Bilateral;  . sebaceous cyst excision  2009   neck  . TONSILLECTOMY AND ADENOIDECTOMY  age 43  . VERTEBROPLASTY  05/2009  . WISDOM TOOTH EXTRACTION      Current Outpatient Medications  Medication Sig Dispense Refill  . Cyanocobalamin (VITAMIN B-12 PO) Take 1 tablet by mouth daily.    . Glucosamine-Chondroitin (GLUCOSAMINE CHONDR COMPLEX PO) Take 1 tablet by mouth daily. 1500mg  glucosamine, 1200mg  chondroitin    . metoprolol tartrate (LOPRESSOR) 25 MG tablet Take 0.5 tablets (12.5 mg total) by mouth 2 (two) times daily. 30 tablet 3  . omeprazole (PRILOSEC) 20 MG capsule Take 20 mg by mouth daily.     Marland Kitchen oxyCODONE-acetaminophen (PERCOCET/ROXICET) 5-325 MG tablet Take 1 tablet by mouth every 6 (six) hours as needed for severe pain. 10 tablet 0  . warfarin (COUMADIN) 1 MG tablet TAKE 1-1 1/2 TABLETS DAILY AS DIRECTED PER COUMADIN CLINIC 40 tablet 3  . diltiazem (CARDIZEM) 30 MG tablet Take 1 tablet every 4 hours AS NEEDED for AFIB  heart rate >100 0. (Patient not taking: Reported on 05/29/2018) 45 tablet 2   No current facility-administered medications for this encounter.     Allergies  Allergen Reactions  . Ciprofloxacin Shortness Of Breath and Rash  . Scallops [Shellfish Allergy] Anaphylaxis  .  Apixaban Other (See Comments)    fatigue  . Demerol Other (See Comments)    Patient states it made her crazy and sleep for 12 hours  . Doxycycline Nausea And Vomiting  . Milk-Related Compounds Diarrhea  . Penicillins Other (See Comments)    unknown  . Rivaroxaban Other (See Comments)    Pt unsure but d/c use  . Sudafed [Pseudoephedrine Hcl] Other (See Comments)    hyperactive    Social History   Socioeconomic History  . Marital status: Married    Spouse name: Not on file  . Number of children: 2  . Years of education: Not on file  . Highest education  level: Not on file  Occupational History  . Occupation: retired (ran [employer] with her husband)  Social Needs  . Financial resource strain: Not on file  . Food insecurity:    Worry: Not on file    Inability: Not on file  . Transportation needs:    Medical: Not on file    Non-medical: Not on file  Tobacco Use  . Smoking status: Never Smoker  . Smokeless tobacco: Never Used  Substance and Sexual Activity  . Alcohol use: No    Comment: maybe one glass per year.  . Drug use: No  . Sexual activity: Not Currently  Lifestyle  . Physical activity:    Days per week: Not on file    Minutes per session: Not on file  . Stress: Not on file  Relationships  . Social connections:    Talks on phone: Not on file    Gets together: Not on file    Attends religious service: Not on file    Active member of club or organization: Not on file    Attends meetings of clubs or organizations: Not on file    Relationship status: Not on file  . Intimate partner violence:    Fear of current or ex partner: Not on file    Emotionally abused: Not on file    Physically abused: Not on file    Forced sexual activity: Not on file  Other Topics Concern  . Not on file  Social History Narrative   Lives with her husband.  No pets.  Daughter in [place], [place]; Daughter in [place]. Cares for her 2 grandsons    Family History  Problem Relation Age of Onset  . Rheumatic fever Mother   . Diabetes Mother   . Heart disease Mother        rheumatic heart disease  . Cancer Sister 9       breast cancer  . Cancer Brother        bladder and prostate cancer  . Migraines Daughter   . Cancer Sister        leukemia at 37; colon cancer in her late 30's--family ?'s dx    ROS- All systems are reviewed and negative except as per the HPI above  Physical Exam: Vitals:   05/29/18 1436  BP: 118/64  Pulse: 67  Weight: 71.7 kg  Height: 5\' 2"  (1.575 m)   Wt Readings from Last 3 Encounters:  05/29/18 71.7 kg    05/05/18 72.1 kg  02/13/18 71.7 kg    Labs: Lab Results  Component Value Date   NA 139 04/19/2017   K 3.9 04/19/2017   CL 102 04/19/2017   CO2 27 04/19/2017   GLUCOSE 102 (H) 04/19/2017   BUN 16 04/19/2017   CREATININE 0.69 04/19/2017   CALCIUM 9.4  04/19/2017   MG 2.2 01/13/2017   Lab Results  Component Value Date   INR 2.9 04/04/2018   Lab Results  Component Value Date   CHOL 209 (H) 05/26/2012   HDL 46 05/26/2012   LDLCALC 145 (H) 05/26/2012   TRIG 88 05/26/2012     GEN- The patient is well appearing, alert and oriented x 3 today.   Head- normocephalic, atraumatic Eyes-  Sclera clear, conjunctiva pink Ears- hearing intact Oropharynx- clear Neck- supple, no JVP Lymph- no cervical lymphadenopathy Lungs- Clear to ausculation bilaterally, normal work of breathing Heart- Regular rate and rhythm, no murmurs, rubs or gallops, PMI not laterally displaced GI- soft, NT, ND, + BS Extremities- no clubbing, cyanosis, or edema MS- no significant deformity or atrophy Skin- no rash or lesion Psych- euthymic mood, full affect Neuro- strength and sensation are intact  EKG-NSR at 67 bpm, with PVC's,  pr int 156 ms, qrs int 76 ms, qtc 403 ms Epic records reviewed    Assessment and Plan: 1. Paroxysmal afib  General education re afib discussed So far burden sounds low Reviewed use of Cardizem 30 mg as pill in pocket for episodes that do not break after 45-80 mins, if systolic BP is over 998 and HR is over 100 She will buy a BP cuff today Continue warfarin for chadsvasc score of at least 3  F/u with Dr. Caryl Comes per recall afib clinic as needed  Geroge Baseman. Nicholette Dolson, [facility] [address] [place], [place] [address] [phone]

## 2018-05-29 NOTE — Patient Instructions (Signed)
Cardizem 30mg  -- take 1 tablet every 4 hours AS NEEDED for AFIB heart rate over 100 as long as the top number of the blood pressure over 100

## 2018-06-06 DIAGNOSIS — M1712 Unilateral primary osteoarthritis, left knee: Secondary | ICD-10-CM | POA: Diagnosis not present

## 2018-06-09 ENCOUNTER — Ambulatory Visit (INDEPENDENT_AMBULATORY_CARE_PROVIDER_SITE_OTHER): Payer: PPO | Admitting: Pharmacist

## 2018-06-09 DIAGNOSIS — I480 Paroxysmal atrial fibrillation: Secondary | ICD-10-CM

## 2018-06-09 DIAGNOSIS — Z5181 Encounter for therapeutic drug level monitoring: Secondary | ICD-10-CM

## 2018-06-09 LAB — POCT INR: INR: 2.1 (ref 2.0–3.0)

## 2018-06-09 MED ORDER — WARFARIN SODIUM 1 MG PO TABS: ORAL_TABLET | ORAL | 0 refills | Status: DC

## 2018-06-09 NOTE — Patient Instructions (Signed)
Continue taking 1 tablet everyday except 1.5 tablets on Mondays and Fridays. Recheck in 3 weeks. Call with any procedures or new medications [phone].

## 2018-06-23 IMAGING — CT CT KNEE*L* W/O CM
3 series · 16 of 33 positions shown, 19 images · non-contrast
Comparison: Radiographs 04/19/2017.

CLINICAL DATA: Left knee pain, swelling and erythema. Joint
aspiration 2 days ago.

EXAM:
CT OF THE LEFT KNEE WITHOUT CONTRAST
TECHNIQUE: Multidetector CT imaging of the left knee was performed according to
the standard protocol. Multiplanar CT image reconstructions were
also generated.

[Series 3: pelvis st · axial · 0.44mm/px · z∈[-754,-600]mm · 8 of 92 slices shown, 10 images]
[im 8/92  soft-tissue]
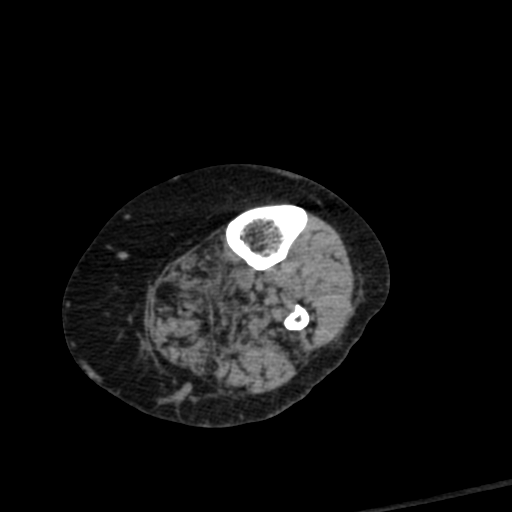
[im 8/92  bone]
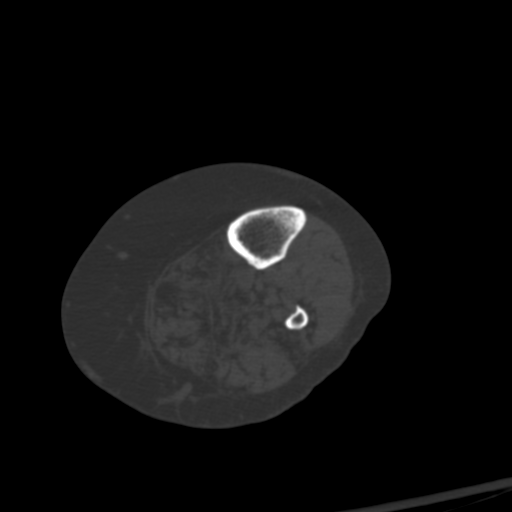
[im 22/92  bone]
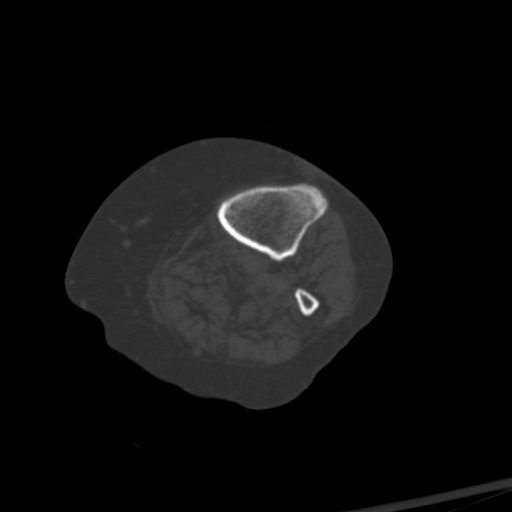
[im 29/92  bone]
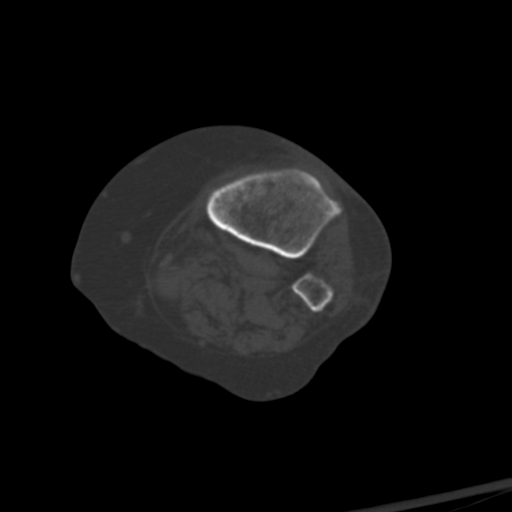
[im 43/92  bone]
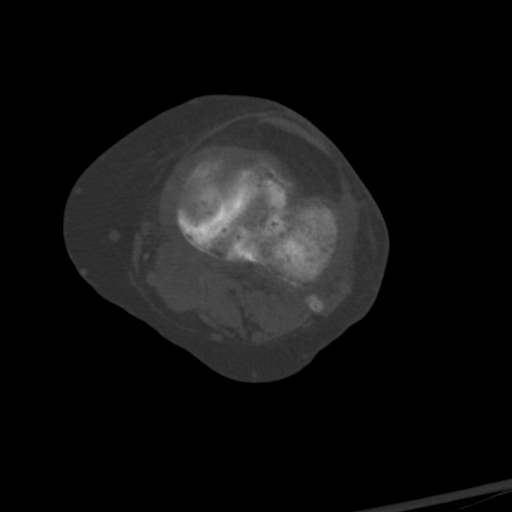
[im 50/92  soft-tissue]
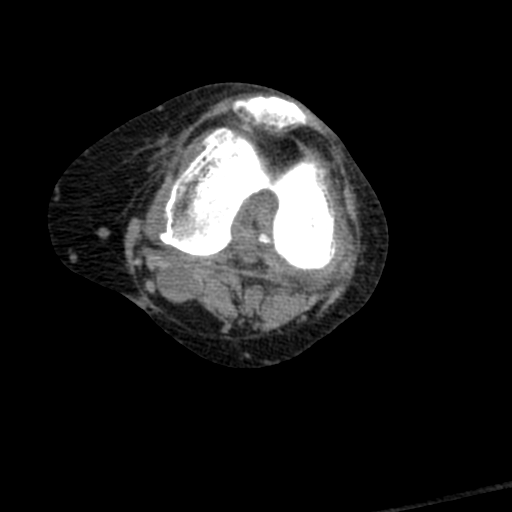
[im 50/92  bone]
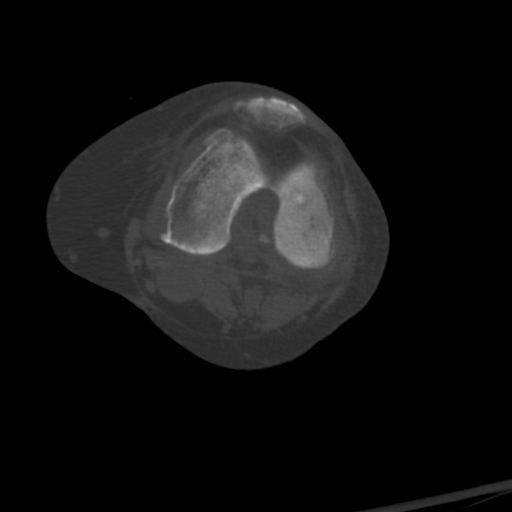
[im 64/92  bone]
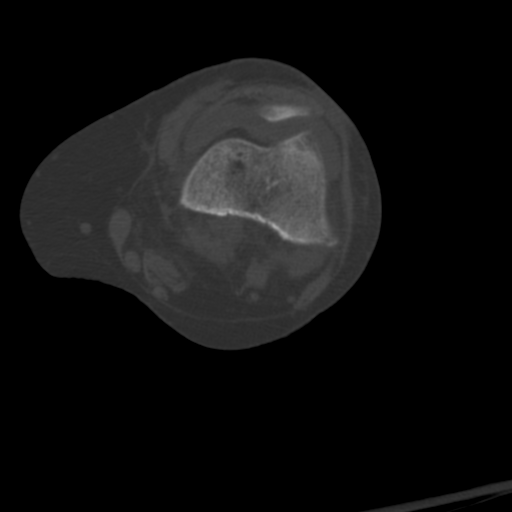
[im 71/92  bone]
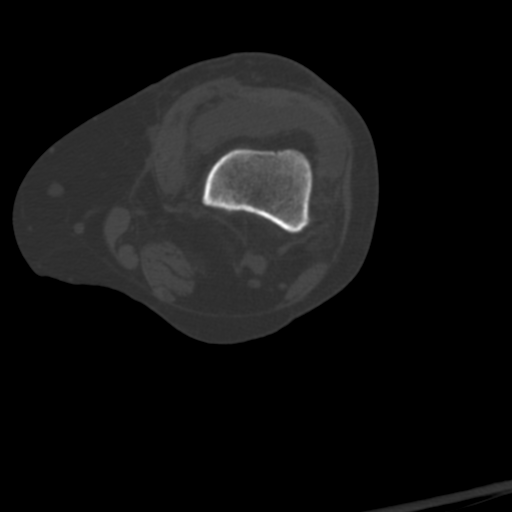
[im 85/92  bone]
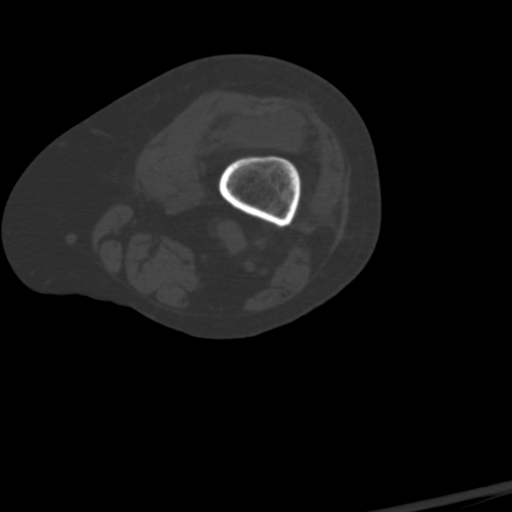

[Series 7: coronal images · coronal · 0.36mm/px · 3 of 76 slices shown]
[im 16/76  bone]
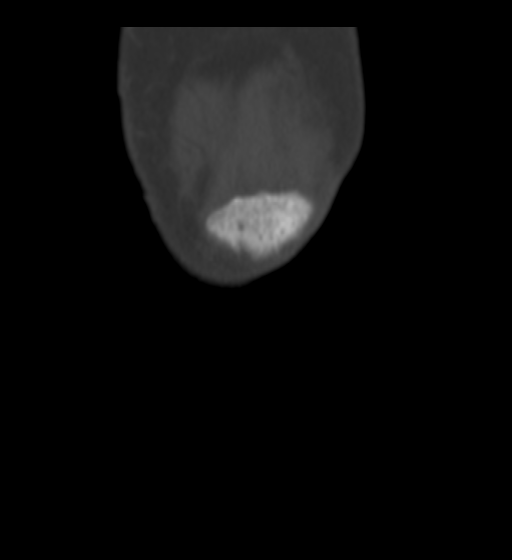
[im 31/76  bone]
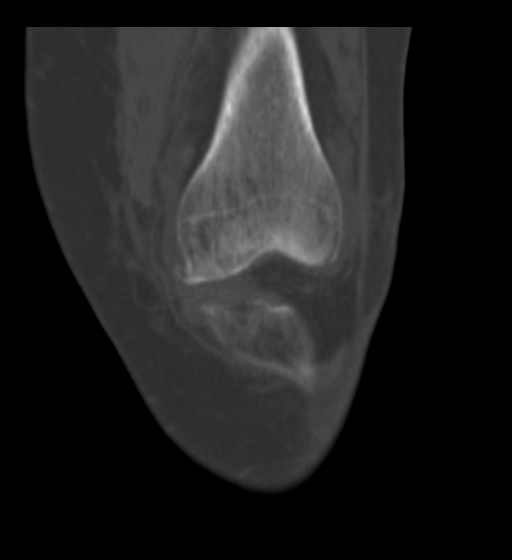
[im 46/76  bone]
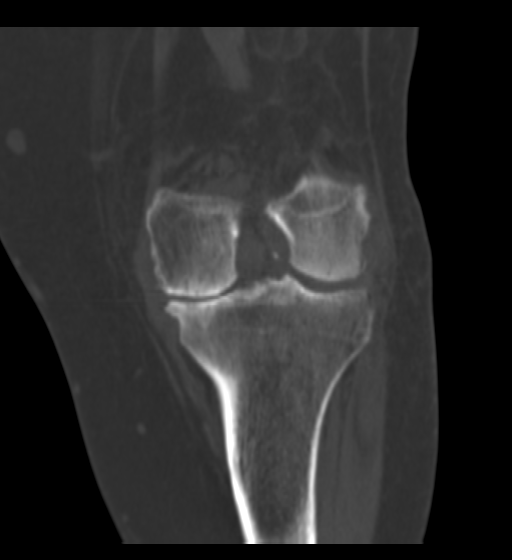

[Series 8: sagittal images · sagittal · 0.40mm/px · 5 of 68 slices shown, 6 images]
[im 23/68  bone]
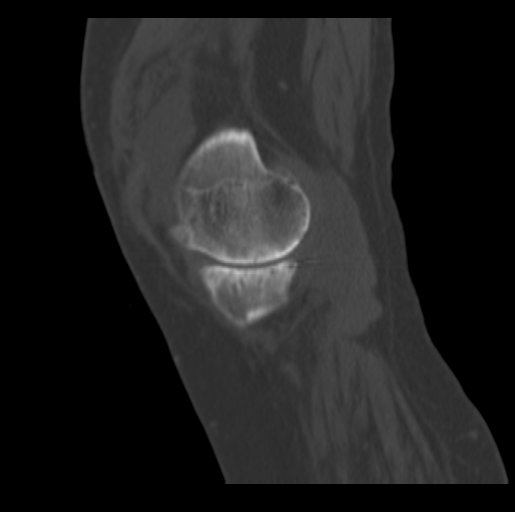
[im 28/68  bone]
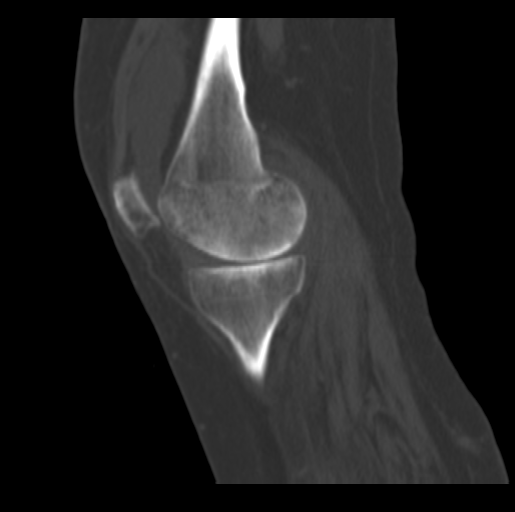
[im 34/68  soft-tissue]
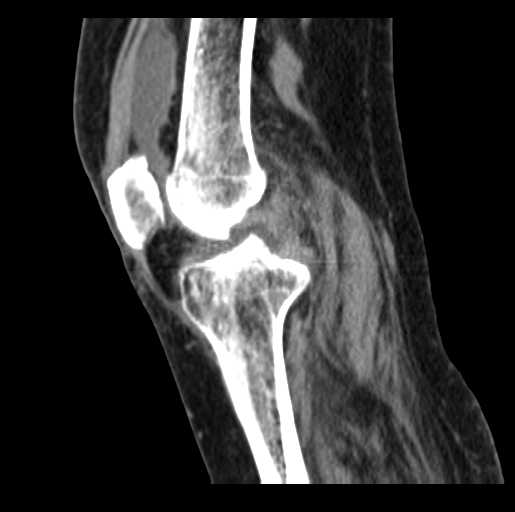
[im 34/68  bone]
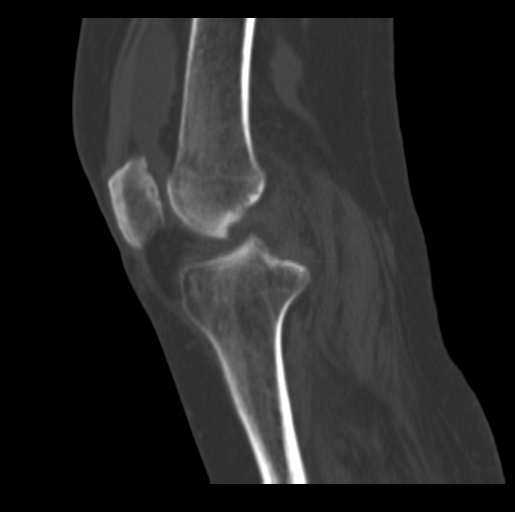
[im 40/68  bone]
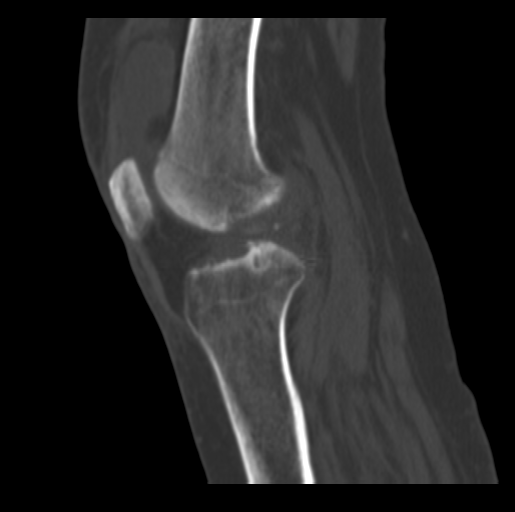
[im 45/68  bone]
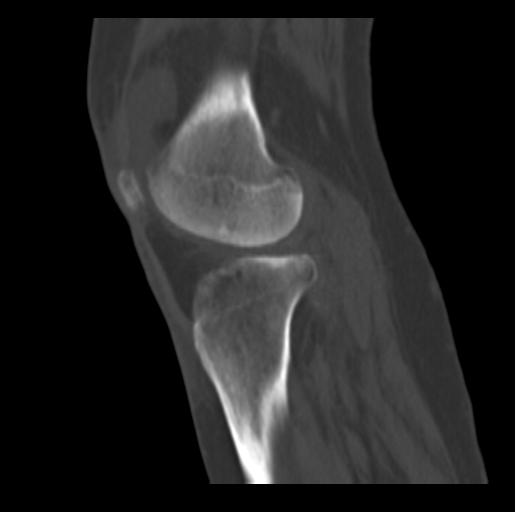

[16 of 33 positions shown; findings below may reference images not displayed]

FINDINGS: Bones/Joint/Cartilage

The bones are demineralized. There is tricompartmental joint space
loss with subchondral cyst formation or erosions and osteophytes,
greatest in the medial compartment. No bone destruction identified.
There is no evidence of acute fracture or dislocation.

There is a moderate size knee joint effusion. This has dependent
high-density components, especially within a moderate-sized Baker's
cyst. There is a small central loose body. No intra-articular air
demonstrated.

Ligaments

Suboptimally assessed by CT. The cruciate ligaments are grossly
intact.

Muscles and Tendons

Intact extensor mechanism. There is muscular atrophy within the
posterior compartment of the lower leg.

Soft tissues

Baker's cyst measures up to 6.4 cm in length. Mild edema within the
subcutaneous fat anteriorly. No focal fluid collections.
IMPRESSION: 1. Moderate size knee joint effusion with high-density components
inferiorly. Moderate-sized Baker's cyst.
2. No acute osseous findings demonstrated. Tricompartmental joint
space narrowing with osteophytes and subchondral cysts or erosions,
greatest in the medial compartment.

## 2018-07-07 ENCOUNTER — Ambulatory Visit (INDEPENDENT_AMBULATORY_CARE_PROVIDER_SITE_OTHER): Payer: PPO | Admitting: Pharmacist

## 2018-07-07 DIAGNOSIS — I480 Paroxysmal atrial fibrillation: Secondary | ICD-10-CM | POA: Diagnosis not present

## 2018-07-07 DIAGNOSIS — Z5181 Encounter for therapeutic drug level monitoring: Secondary | ICD-10-CM | POA: Diagnosis not present

## 2018-07-07 LAB — POCT INR: INR: 4.2 — AB (ref 2.0–3.0)

## 2018-07-18 DIAGNOSIS — M5417 Radiculopathy, lumbosacral region: Secondary | ICD-10-CM | POA: Diagnosis not present

## 2018-07-18 DIAGNOSIS — M545 Low back pain: Secondary | ICD-10-CM | POA: Diagnosis not present

## 2018-07-19 ENCOUNTER — Other Ambulatory Visit (HOSPITAL_COMMUNITY): Payer: Self-pay | Admitting: Nurse Practitioner

## 2018-08-14 DIAGNOSIS — S46011D Strain of muscle(s) and tendon(s) of the rotator cuff of right shoulder, subsequent encounter: Secondary | ICD-10-CM | POA: Diagnosis not present

## 2018-08-14 DIAGNOSIS — M7541 Impingement syndrome of right shoulder: Secondary | ICD-10-CM | POA: Diagnosis not present

## 2018-08-14 DIAGNOSIS — M25511 Pain in right shoulder: Secondary | ICD-10-CM | POA: Diagnosis not present

## 2018-08-25 DIAGNOSIS — R972 Elevated prostate specific antigen [PSA]: Secondary | ICD-10-CM | POA: Diagnosis not present

## 2018-09-01 ENCOUNTER — Other Ambulatory Visit: Payer: Self-pay | Admitting: *Deleted

## 2018-09-01 ENCOUNTER — Ambulatory Visit (INDEPENDENT_AMBULATORY_CARE_PROVIDER_SITE_OTHER): Payer: PPO | Admitting: *Deleted

## 2018-09-01 DIAGNOSIS — I480 Paroxysmal atrial fibrillation: Secondary | ICD-10-CM

## 2018-09-01 DIAGNOSIS — Z5181 Encounter for therapeutic drug level monitoring: Secondary | ICD-10-CM | POA: Diagnosis not present

## 2018-09-01 LAB — POCT INR: INR: 3 (ref 2.0–3.0)

## 2018-09-01 MED ORDER — WARFARIN SODIUM 1 MG PO TABS: ORAL_TABLET | ORAL | 0 refills | Status: DC

## 2018-09-01 NOTE — Patient Instructions (Signed)
Description   Continue taking 1 tablet everyday except 1.5 tablets on Mondays and Fridays. Recheck in 3 weeks. Call with any procedures or new medications [phone].

## 2018-09-25 ENCOUNTER — Telehealth: Payer: Self-pay

## 2018-09-25 NOTE — Telephone Encounter (Signed)
lmom for prescreen/drive thru 

## 2018-10-23 ENCOUNTER — Telehealth: Payer: Self-pay | Admitting: Pharmacist Clinician (PhC)/ Clinical Pharmacy Specialist

## 2018-10-23 NOTE — Telephone Encounter (Signed)
Spoke with patient.  She has not been driving for several weeks due to stay at home order.  Had fall several days ago and had to call EMS.  Did not go to ED, but states when she told them she was on warfarin they did an INR check then.  She cannot recall reading, but was told that her blood work looked fine.  No Epic notes from EMS, so cannot verify.    Will reach out to patient in early June to determine when INR in office can be done again.

## 2018-10-23 NOTE — Telephone Encounter (Signed)
LMOM   Patient has declined driving to [facility] office for drive thru INR checks.  She drives herself and states she is not comfortable driving that far.  Left message today stating that it will be most likely another 6 weeks before we get the coumadin clinic going in the office again.    Of note, we did a DOAC pricing based on her insurance card.  Looks like she has a $0 copay and $45/month or $90/3 month copay for either Eliquis or Xarelto.

## 2018-11-01 ENCOUNTER — Emergency Department (HOSPITAL_COMMUNITY): Payer: PPO

## 2018-11-01 ENCOUNTER — Other Ambulatory Visit: Payer: Self-pay

## 2018-11-01 ENCOUNTER — Encounter (HOSPITAL_COMMUNITY): Payer: Self-pay

## 2018-11-01 ENCOUNTER — Inpatient Hospital Stay (HOSPITAL_COMMUNITY)
Admission: EM | Admit: 2018-11-01 | Discharge: 2018-11-06 | DRG: 854 | Disposition: A | Discharge status: Home / Self Care | Payer: PPO | Attending: Family Medicine | Admitting: Family Medicine

## 2018-11-01 DIAGNOSIS — Z806 Family history of leukemia: Secondary | ICD-10-CM

## 2018-11-01 DIAGNOSIS — R932 Abnormal findings on diagnostic imaging of liver and biliary tract: Secondary | ICD-10-CM | POA: Diagnosis not present

## 2018-11-01 DIAGNOSIS — I110 Hypertensive heart disease with heart failure: Secondary | ICD-10-CM | POA: Diagnosis present

## 2018-11-01 DIAGNOSIS — A419 Sepsis, unspecified organism: Secondary | ICD-10-CM | POA: Diagnosis not present

## 2018-11-01 DIAGNOSIS — Z8249 Family history of ischemic heart disease and other diseases of the circulatory system: Secondary | ICD-10-CM

## 2018-11-01 DIAGNOSIS — I5030 Unspecified diastolic (congestive) heart failure: Secondary | ICD-10-CM | POA: Diagnosis present

## 2018-11-01 DIAGNOSIS — R0689 Other abnormalities of breathing: Secondary | ICD-10-CM | POA: Diagnosis not present

## 2018-11-01 DIAGNOSIS — K449 Diaphragmatic hernia without obstruction or gangrene: Secondary | ICD-10-CM | POA: Diagnosis not present

## 2018-11-01 DIAGNOSIS — Z7951 Long term (current) use of inhaled steroids: Secondary | ICD-10-CM | POA: Diagnosis not present

## 2018-11-01 DIAGNOSIS — E7800 Pure hypercholesterolemia, unspecified: Secondary | ICD-10-CM | POA: Diagnosis present

## 2018-11-01 DIAGNOSIS — I517 Cardiomegaly: Secondary | ICD-10-CM | POA: Diagnosis not present

## 2018-11-01 DIAGNOSIS — M8580 Other specified disorders of bone density and structure, unspecified site: Secondary | ICD-10-CM | POA: Diagnosis present

## 2018-11-01 DIAGNOSIS — Z800 Family history of malignant neoplasm of digestive organs: Secondary | ICD-10-CM | POA: Diagnosis not present

## 2018-11-01 DIAGNOSIS — R935 Abnormal findings on diagnostic imaging of other abdominal regions, including retroperitoneum: Secondary | ICD-10-CM | POA: Diagnosis not present

## 2018-11-01 DIAGNOSIS — Z90710 Acquired absence of both cervix and uterus: Secondary | ICD-10-CM

## 2018-11-01 DIAGNOSIS — M549 Dorsalgia, unspecified: Secondary | ICD-10-CM | POA: Diagnosis not present

## 2018-11-01 DIAGNOSIS — G8929 Other chronic pain: Secondary | ICD-10-CM | POA: Diagnosis not present

## 2018-11-01 DIAGNOSIS — Z7901 Long term (current) use of anticoagulants: Secondary | ICD-10-CM

## 2018-11-01 DIAGNOSIS — Z880 Allergy status to penicillin: Secondary | ICD-10-CM

## 2018-11-01 DIAGNOSIS — Z803 Family history of malignant neoplasm of breast: Secondary | ICD-10-CM | POA: Diagnosis not present

## 2018-11-01 DIAGNOSIS — Z8719 Personal history of other diseases of the digestive system: Secondary | ICD-10-CM

## 2018-11-01 DIAGNOSIS — Z20828 Contact with and (suspected) exposure to other viral communicable diseases: Secondary | ICD-10-CM | POA: Diagnosis present

## 2018-11-01 DIAGNOSIS — K660 Peritoneal adhesions (postprocedural) (postinfection): Secondary | ICD-10-CM | POA: Diagnosis present

## 2018-11-01 DIAGNOSIS — D734 Cyst of spleen: Secondary | ICD-10-CM | POA: Diagnosis not present

## 2018-11-01 DIAGNOSIS — K8070 Calculus of gallbladder and bile duct without cholecystitis without obstruction: Secondary | ICD-10-CM

## 2018-11-01 DIAGNOSIS — I4891 Unspecified atrial fibrillation: Secondary | ICD-10-CM | POA: Diagnosis present

## 2018-11-01 DIAGNOSIS — K8010 Calculus of gallbladder with chronic cholecystitis without obstruction: Secondary | ICD-10-CM | POA: Diagnosis not present

## 2018-11-01 DIAGNOSIS — K219 Gastro-esophageal reflux disease without esophagitis: Secondary | ICD-10-CM | POA: Diagnosis present

## 2018-11-01 DIAGNOSIS — Z01810 Encounter for preprocedural cardiovascular examination: Secondary | ICD-10-CM | POA: Diagnosis not present

## 2018-11-01 DIAGNOSIS — R945 Abnormal results of liver function studies: Secondary | ICD-10-CM | POA: Diagnosis not present

## 2018-11-01 DIAGNOSIS — I480 Paroxysmal atrial fibrillation: Secondary | ICD-10-CM | POA: Diagnosis not present

## 2018-11-01 DIAGNOSIS — Z79899 Other long term (current) drug therapy: Secondary | ICD-10-CM

## 2018-11-01 DIAGNOSIS — E785 Hyperlipidemia, unspecified: Secondary | ICD-10-CM | POA: Diagnosis present

## 2018-11-01 DIAGNOSIS — Z03818 Encounter for observation for suspected exposure to other biological agents ruled out: Secondary | ICD-10-CM | POA: Diagnosis not present

## 2018-11-01 DIAGNOSIS — R740 Nonspecific elevation of levels of transaminase and lactic acid dehydrogenase [LDH]: Secondary | ICD-10-CM | POA: Diagnosis not present

## 2018-11-01 DIAGNOSIS — Z833 Family history of diabetes mellitus: Secondary | ICD-10-CM

## 2018-11-01 DIAGNOSIS — K8309 Other cholangitis: Secondary | ICD-10-CM | POA: Diagnosis present

## 2018-11-01 DIAGNOSIS — K8020 Calculus of gallbladder without cholecystitis without obstruction: Secondary | ICD-10-CM | POA: Diagnosis not present

## 2018-11-01 DIAGNOSIS — I340 Nonrheumatic mitral (valve) insufficiency: Secondary | ICD-10-CM | POA: Diagnosis not present

## 2018-11-01 DIAGNOSIS — R000 Tachycardia, unspecified: Secondary | ICD-10-CM | POA: Diagnosis not present

## 2018-11-01 DIAGNOSIS — R933 Abnormal findings on diagnostic imaging of other parts of digestive tract: Secondary | ICD-10-CM

## 2018-11-01 DIAGNOSIS — R0682 Tachypnea, not elsewhere classified: Secondary | ICD-10-CM | POA: Diagnosis not present

## 2018-11-01 DIAGNOSIS — I361 Nonrheumatic tricuspid (valve) insufficiency: Secondary | ICD-10-CM | POA: Diagnosis not present

## 2018-11-01 DIAGNOSIS — E860 Dehydration: Secondary | ICD-10-CM | POA: Diagnosis not present

## 2018-11-01 DIAGNOSIS — Z79891 Long term (current) use of opiate analgesic: Secondary | ICD-10-CM

## 2018-11-01 DIAGNOSIS — R0902 Hypoxemia: Secondary | ICD-10-CM | POA: Diagnosis not present

## 2018-11-01 LAB — SARS CORONAVIRUS 2 BY RT PCR (HOSPITAL ORDER, PERFORMED IN ~~LOC~~ HOSPITAL LAB): SARS Coronavirus 2: NEGATIVE

## 2018-11-01 LAB — COMPREHENSIVE METABOLIC PANEL
ALT: 778 U/L — ABNORMAL HIGH (ref 0–44)
AST: 758 U/L — ABNORMAL HIGH (ref 15–41)
Albumin: 3.5 g/dL (ref 3.5–5.0)
Alkaline Phosphatase: 238 U/L — ABNORMAL HIGH (ref 38–126)
Anion gap: 9 (ref 5–15)
BUN: 9 mg/dL (ref 8–23)
CO2: 22 mmol/L (ref 22–32)
Calcium: 8.4 mg/dL — ABNORMAL LOW (ref 8.9–10.3)
Chloride: 105 mmol/L (ref 98–111)
Creatinine, Ser: 0.62 mg/dL (ref 0.44–1.00)
GFR calc Af Amer: >60 mL/min (ref >60)
GFR calc non Af Amer: >60 mL/min (ref >60)
Glucose, Bld: 119 mg/dL — ABNORMAL HIGH (ref 70–99)
Potassium: 3.9 mmol/L (ref 3.5–5.1)
Sodium: 136 mmol/L (ref 135–145)
Total Bilirubin: 4.9 mg/dL — ABNORMAL HIGH (ref 0.3–1.2)
Total Protein: 6.6 g/dL (ref 6.5–8.1)

## 2018-11-01 LAB — URINALYSIS, ROUTINE W REFLEX MICROSCOPIC
Bilirubin Urine: NEGATIVE
Glucose, UA: NEGATIVE mg/dL
Hgb urine dipstick: NEGATIVE
Ketones, ur: 20 mg/dL — AB
Nitrite: NEGATIVE
Protein, ur: NEGATIVE mg/dL
Specific Gravity, Urine: 1.013 (ref 1.005–1.030)
WBC, UA: >50 WBC/hpf — ABNORMAL HIGH (ref 0–5)
pH: 7 (ref 5.0–8.0)

## 2018-11-01 LAB — CBC WITH DIFFERENTIAL/PLATELET
Abs Immature Granulocytes: 0.05 10*3/uL (ref 0.00–0.07)
Basophils Absolute: 0 10*3/uL (ref 0.0–0.1)
Basophils Relative: 0 %
Eosinophils Absolute: 0 10*3/uL (ref 0.0–0.5)
Eosinophils Relative: 0 %
HCT: 38.1 % (ref 36.0–46.0)
Hemoglobin: 12.6 g/dL (ref 12.0–15.0)
Immature Granulocytes: 0 %
Lymphocytes Relative: 2 %
Lymphs Abs: 0.2 10*3/uL — ABNORMAL LOW (ref 0.7–4.0)
MCH: 29.7 pg (ref 26.0–34.0)
MCHC: 33.1 g/dL (ref 30.0–36.0)
MCV: 89.9 fL (ref 80.0–100.0)
Monocytes Absolute: 0.5 10*3/uL (ref 0.1–1.0)
Monocytes Relative: 3 %
Neutro Abs: 12.6 10*3/uL — ABNORMAL HIGH (ref 1.7–7.7)
Neutrophils Relative %: 95 %
Platelets: 183 10*3/uL (ref 150–400)
RBC: 4.24 MIL/uL (ref 3.87–5.11)
RDW: 14 % (ref 11.5–15.5)
WBC: 13.4 10*3/uL — ABNORMAL HIGH (ref 4.0–10.5)
nRBC: 0 % (ref 0.0–0.2)

## 2018-11-01 LAB — PROTIME-INR
INR: 2.9 — ABNORMAL HIGH (ref 0.8–1.2)
Prothrombin Time: 29.7 seconds — ABNORMAL HIGH (ref 11.4–15.2)

## 2018-11-01 LAB — LACTIC ACID, PLASMA: Lactic Acid, Venous: 1.9 mmol/L (ref 0.5–1.9)

## 2018-11-01 LAB — TROPONIN I: Troponin I: <0.03 ng/mL (ref <0.03)

## 2018-11-01 LAB — LIPASE, BLOOD: Lipase: 26 U/L (ref 11–51)

## 2018-11-01 MED ORDER — PIPERACILLIN-TAZOBACTAM 3.375 G IVPB 30 MIN
3.3750 g | Freq: Once | INTRAVENOUS | Status: AC
  Administered 2018-11-01: 18:00:00 3.375 g via INTRAVENOUS
  Filled 2018-11-01: qty 50

## 2018-11-01 MED ORDER — HYDROCODONE-ACETAMINOPHEN 5-325 MG PO TABS: 1.0000 | ORAL_TABLET | ORAL | Status: DC | PRN

## 2018-11-01 MED ORDER — SODIUM CHLORIDE 0.9% FLUSH
3.0000 mL | Freq: Once | INTRAVENOUS | Status: AC
  Administered 2018-11-01: 15:00:00 3 mL via INTRAVENOUS

## 2018-11-01 MED ORDER — GADOBUTROL 1 MMOL/ML IV SOLN
7.0000 mL | Freq: Once | INTRAVENOUS | Status: AC | PRN
  Administered 2018-11-01: 20:00:00 7 mL via INTRAVENOUS

## 2018-11-01 MED ORDER — SODIUM CHLORIDE 0.9 % IV BOLUS
1000.0000 mL | Freq: Once | INTRAVENOUS | Status: AC
  Administered 2018-11-01: 19:00:00 1000 mL via INTRAVENOUS

## 2018-11-01 MED ORDER — METOPROLOL TARTRATE 25 MG PO TABS
12.5000 mg | ORAL_TABLET | Freq: Two times a day (BID) | ORAL | Status: DC
  Administered 2018-11-01 – 2018-11-02 (×2): 12.5 mg via ORAL
  Filled 2018-11-01 (×2): qty 1

## 2018-11-01 MED ORDER — ACETAMINOPHEN 650 MG RE SUPP: 650.0000 mg | Freq: Four times a day (QID) | RECTAL | Status: DC | PRN

## 2018-11-01 MED ORDER — ONDANSETRON HCL 4 MG/2ML IJ SOLN: 4.0000 mg | Freq: Four times a day (QID) | INTRAMUSCULAR | Status: DC | PRN

## 2018-11-01 MED ORDER — FENTANYL CITRATE (PF) 100 MCG/2ML IJ SOLN: 25.0000 ug | INTRAMUSCULAR | Status: DC | PRN

## 2018-11-01 MED ORDER — ONDANSETRON HCL 4 MG PO TABS: 4.0000 mg | ORAL_TABLET | Freq: Four times a day (QID) | ORAL | Status: DC | PRN

## 2018-11-01 MED ORDER — SODIUM CHLORIDE 0.9 % IV SOLN
INTRAVENOUS | Status: AC
  Administered 2018-11-01: 22:00:00 via INTRAVENOUS

## 2018-11-01 MED ORDER — ACETAMINOPHEN 325 MG PO TABS
650.0000 mg | ORAL_TABLET | Freq: Four times a day (QID) | ORAL | Status: DC | PRN
  Administered 2018-11-02 – 2018-11-03 (×3): 650 mg via ORAL
  Filled 2018-11-01 (×3): qty 2

## 2018-11-01 NOTE — H&P (Addendum)
Samantha Foley EXB:[id number] DOB: Mar 25, 1930 DOA: 11/01/2018     PCP: Levin Erp, MD   Outpatient Specialists:   CARDS:   Dr. Caryl Comes      Patient arrived to ER on 11/01/18 at 1420  Patient coming from: home Lives alone,        Chief Complaint:  Chief Complaint  Patient presents with   Abdominal Pain   Fever    HPI: Samantha Foley is a 85 y.o. female with medical history significant of A.fib, chronic back pain, GERD, HLD     Presented with  Malaise and fever has been having abdominal pain for the past 1 week some nausea and vomiting at home temperature as high as 102.  Has been having progressive weakness for the past 2 weeks decreased appetite. On arrival temperature 102.7 tachycardic up to 120s respirations in the 30s Patient felt that this was her gallbladder problems and stopped eating fatty foods but continued to have more pain despite this. Denies associated cough or dysuria no diarrhea Patient has been isolating at home her family when visits her wears masks  Infectious risk factors:  Reports  Fever, tested COVID NEG IN ER    Regarding pertinent Chronic problems:     HTN on metoprolol  Reports compliant with her medications   CHF diastolic  - last echo 1027 showed preserved EF but grade 1 diastolic dysfunction      A. Fib -   CHA2DS2 vas score >3 :            - currently  on anticoagulation with  Coumadin followed by A. fib clinic        -  Rate control:  Metoprolol,             Reports compliant with her medications  While in ER: Noted to have evidence of sepsis with leukocytosis And elevated LFTs CT of abdomen was done showing RUQ US  done Gallstones but no biliary dilatation Case was discussed with [facility] who agreed to see patient in consult tomorrow but would like MRCP done Patient suspected to have cholangitis Started on Zosyn  The following Work up has been ordered so far:  Orders Placed This Encounter  Procedures   Culture, blood  (Routine x 2)   SARS Coronavirus 2 (CEPHEID - Performed in [place] hospital lab), [place]   DG Chest 2 View   US Abdomen Complete   MR ABDOMEN MRCP W WO CONTAST   MR 3D Recon At Scanner   Comprehensive metabolic panel   CBC with Differential   Protime-INR   Urinalysis, Routine w reflex microscopic   Lipase, blood   Troponin I - Add-On to previous collection   Notify Physician if pt is possible Sepsis patient   Document Actual / Estimated Weight   Insert / maintain saline lock   Cardiac monitoring   Consult to gastroenterology   Consult to hospitalist   EKG 12-Lead   EKG 12-Lead   Admit to Inpatient (patient's expected length of stay will be greater than 2 midnights or inpatient only procedure)    Following Medications were ordered in ER: Medications  sodium chloride flush (NS) 0.9 % injection 3 mL (3 mLs Intravenous Given 11/01/18 1453)  piperacillin-tazobactam (ZOSYN) IVPB 3.375 g (0 g Intravenous Stopped 11/01/18 1822)  sodium chloride 0.9 % bolus 1,000 mL (0 mLs Intravenous Stopped 11/01/18 2053)  gadobutrol (GADAVIST) 1 MMOL/ML injection 7 mL (7 mLs Intravenous Contrast Given 11/01/18 1946)  Consult Orders  (From admission, onward)         Start     Ordered   11/01/18 1758  Consult to hospitalist  Once    Provider:  Toy Baker, MD  Question Answer Comment  Place call to: [facility]   Reason for Consult Admit      11/01/18 1757          ER Provider Called:   [facility]   Dr Therisa Doyne  They Recommend admit to medicine order MRCP Will see in AM    Significant initial  Findings: Abnormal Labs Reviewed  COMPREHENSIVE METABOLIC PANEL - Abnormal; Notable for the following components:      Result Value   Glucose, Bld 119 (*)    Calcium 8.4 (*)    AST 758 (*)    ALT 778 (*)    Alkaline Phosphatase 238 (*)    Total Bilirubin 4.9 (*)    All other components within normal limits  CBC WITH DIFFERENTIAL/PLATELET - Abnormal;  Notable for the following components:   WBC 13.4 (*)    Neutro Abs 12.6 (*)    Lymphs Abs 0.2 (*)    All other components within normal limits  PROTIME-INR - Abnormal; Notable for the following components:   Prothrombin Time 29.7 (*)    INR 2.9 (*)    All other components within normal limits  URINALYSIS, ROUTINE W REFLEX MICROSCOPIC - Abnormal; Notable for the following components:   Color, Urine AMBER (*)    APPearance HAZY (*)    Ketones, ur 20 (*)    Leukocytes,Ua LARGE (*)    WBC, UA >50 (*)    Bacteria, UA FEW (*)    Non Squamous Epithelial 0-5 (*)    All other components within normal limits   Otherwise labs showing:    Recent Labs  Lab 11/01/18 1428  NA 136  K 3.9  CO2 22  GLUCOSE 119*  BUN 9  CREATININE 0.62  CALCIUM 8.4*    Cr   stable,  Up from baseline see below Lab Results  Component Value Date   CREATININE 0.62 11/01/2018   CREATININE 0.69 04/19/2017   CREATININE 0.68 01/13/2017    Recent Labs  Lab 11/01/18 1428  AST 758*  ALT 778*  ALKPHOS 238*  BILITOT 4.9*  PROT 6.6  ALBUMIN 3.5   Lab Results  Component Value Date   CALCIUM 8.4 (L) 11/01/2018   INR 2.9     WBC       Component Value Date/Time   WBC 13.4 (H) 11/01/2018 1428   ANC    Component Value Date/Time   NEUTROABS 12.6 (H) 11/01/2018 1428      Lactic Acid, Venous    Component Value Date/Time   LATICACIDVEN 1.9 11/01/2018 1428      HG/HCT stable,      Component Value Date/Time   HGB 12.6 11/01/2018 1428   HCT 38.1 11/01/2018 1428    Recent Labs  Lab 11/01/18 1443  LIPASE 26         UA large leukoesterase and leukocytes but no bacteria   Urine analysis:    Component Value Date/Time   COLORURINE AMBER (A) 11/01/2018 1428   APPEARANCEUR HAZY (A) 11/01/2018 1428   LABSPEC 1.013 11/01/2018 1428   PHURINE 7.0 11/01/2018 1428   GLUCOSEU NEGATIVE 11/01/2018 [place] 11/01/2018 [place] 11/01/2018 1428   BILIRUBINUR neg  08/15/2011 1214   KETONESUR 20 (A) 11/01/2018  [place] 11/01/2018 1428   UROBILINOGEN 0.2 09/23/2012 1628   NITRITE NEGATIVE 11/01/2018 1428   LEUKOCYTESUR LARGE (A) 11/01/2018 1428        CXR -cardiomegaly  Right upper quadrant ultra Sound- Cholelithiasis, No cholecystitis  ECG:  Personally reviewed by me showing: HR : 101 Rhythm:  Sinus tachycardia     no evidence of ischemic changes QTC 440      ED Triage Vitals  Enc Vitals Group     BP 11/01/18 1429 (!) 144/69     Pulse Rate 11/01/18 1429 (!) 117     Resp 11/01/18 1429 17     Temp 11/01/18 1429 (!) 100.4 F (38 C)     Temp Source 11/01/18 1429 Oral     SpO2 11/01/18 1429 99 %     Weight 11/01/18 1516 158 lb 8.2 oz (71.9 kg)     Height --      Head Circumference --      Peak Flow --      Pain Score 11/01/18 1425 3     Pain Loc --      Pain Edu? --      Excl. in [place]? --   TMAX(24)@       Latest  Blood pressure 107/60, pulse (!) 105, temperature (!) 100.4 F (38 C), temperature source Oral, resp. rate 19, weight 71.9 kg, SpO2 94 %.     Hospitalist was called for admission for presumed cholangitis   Review of Systems:    Pertinent positives include:  Fevers, chills  abdominal pain, nausea, vomiting,  Constitutional:  No weight loss, night sweats,, fatigue, weight loss  HEENT:  No headaches, Difficulty swallowing,Tooth/dental problems,Sore throat,  No sneezing, itching, ear ache, nasal congestion, post nasal drip,  Cardio-vascular:  No chest pain, Orthopnea, PND, anasarca, dizziness, palpitations.no Bilateral lower extremity swelling  GI:  No heartburn, indigestion, diarrhea, change in bowel habits, loss of appetite, melena, blood in stool, hematemesis Resp:  no shortness of breath at rest. No dyspnea on exertion, No excess mucus, no productive cough, No non-productive cough, No coughing up of blood.No change in color of mucus.No wheezing. Skin:  no rash or lesions. No jaundice GU:    no dysuria, change in color of urine, no urgency or frequency. No straining to urinate.  No flank pain.  Musculoskeletal:  No joint pain or no joint swelling. No decreased range of motion. No back pain.  Psych:  No change in mood or affect. No depression or anxiety. No memory loss.  Neuro: no localizing neurological complaints, no tingling, no weakness, no double vision, no gait abnormality, no slurred speech, no confusion  All systems reviewed and apart from [place] all are negative  Past Medical History:   Past Medical History:  Diagnosis Date   Arthritis    Atrial fibrillation ([place])    reported but not yet documented   Chronic back pain    Dr. Tonita Cong   Chronic knee pain    Dr. Rhona Raider   Colon polyp    Dr. Wynetta Emery   Dysrhythmia    atrial fib- mild per pt- no meds   Food allergy    Scallops--no other seafood allergies   GERD (gastroesophageal reflux disease)    Hiatal hernia    Hyperlipidemia    Impaired glucose tolerance    Lactose intolerance    Osteopenia    PVC's (premature ventricular contractions)    Seasonal allergies    Vision problem  wears contacts      Past Surgical History:  Procedure Laterality Date   BACK SURGERY     several vertbea ae rcemntekd   ROBOTIC ASSISTED TOTAL HYSTERECTOMY WITH BILATERAL SALPINGO OOPHERECTOMY Bilateral 03/08/2016   Procedure: ROBOTIC ASSISTED TOTAL HYSTERECTOMY WITH BILATERAL SALPINGO OOPHORECTOMY/Uterosacral Ligament Suspension;  Surgeon: Princess Bruins, MD;  Location: [facility];  Service: Gynecology;  Laterality: Bilateral;   sebaceous cyst excision  2009   neck   TONSILLECTOMY AND ADENOIDECTOMY  age 48   VERTEBROPLASTY  05/2009   WISDOM TOOTH EXTRACTION      Social History:  Ambulatory  independently       reports that she has never smoked. She has never used smokeless tobacco. She reports that she does not drink alcohol or use drugs.     Family History:   Family History  Problem Relation  Age of Onset   Rheumatic fever Mother    Diabetes Mother    Heart disease Mother        rheumatic heart disease   Cancer Sister 50       breast cancer   Cancer Brother        bladder and prostate cancer   Migraines Daughter    Cancer Sister        leukemia at 58; colon cancer in her late 30's--family ?'s dx    Allergies: Allergies  Allergen Reactions   Ciprofloxacin Shortness Of Breath and Rash   Scallops [Shellfish Allergy] Anaphylaxis   Apixaban Other (See Comments)    fatigue   Demerol Other (See Comments)    Patient states it made her crazy and sleep for 12 hours   Doxycycline Nausea And Vomiting   Milk-Related Compounds Diarrhea   Penicillins Other (See Comments)    unknown   Rivaroxaban Other (See Comments)    Pt unsure but d/c use   Sudafed [Pseudoephedrine Hcl] Other (See Comments)    hyperactive     Prior to Admission medications   Medication Sig Start Date End Date Taking? Authorizing Provider  Cyanocobalamin (VITAMIN B-12 PO) Take 1 tablet by mouth daily.    [provider]  diltiazem (CARDIZEM) 30 MG tablet Take 1 tablet every 4 hours AS NEEDED for AFIB  heart rate >100 0. Patient not taking: Reported on 05/29/2018 08/01/17   Sherran Needs, NP  Glucosamine-Chondroitin (GLUCOSAMINE CHONDR COMPLEX PO) Take 1 tablet by mouth daily. 1500mg  glucosamine, 1200mg  chondroitin    [provider]  metoprolol tartrate (LOPRESSOR) 25 MG tablet TAKE 1/2 TABLET BY MOUTH 2 TIMES DAILY 07/21/18   Sherran Needs, NP  omeprazole (PRILOSEC) 20 MG capsule Take 20 mg by mouth daily.     [provider]  oxyCODONE-acetaminophen (PERCOCET/ROXICET) 5-325 MG tablet Take 1 tablet by mouth every 6 (six) hours as needed for severe pain. 04/21/17   Langston Masker B, PA-C  warfarin (COUMADIN) 1 MG tablet TAKE 1-1 1/2 TABLETS DAILY AS DIRECTED PER COUMADIN CLINIC 09/01/18   Deboraha Sprang, MD   Physical Exam: Blood pressure 107/60, pulse (!) 105,  temperature (!) 100.4 F (38 C), temperature source Oral, resp. rate 19, weight 71.9 kg, SpO2 94 %. 1. General:  in No  Acute distress    Chronically  well  -appearing 2. Psychological: Alert and   Oriented 3. Head/ENT:    Dry Mucous Membranes                          Head  Non traumatic, neck supple                           Poor Dentition 4. SKIN:   decreased Skin turgor,  Skin clean Dry and intact no rash 5. Heart: Regular rate and rhythm no  Murmur, no Rub or gallop 6. Lungs:  Clear to auscultation bilaterally, no wheezes or crackles   7. Abdomen: Soft, non-tender, Non distended obese bowel bowel sounds present 8. Lower extremities: no clubbing, cyanosis, no  edema 9. Neurologically Grossly intact, moving all 4 extremities equally  10. MSK: Normal range of motion   All other LABS:     Recent Labs  Lab 11/01/18 1428  WBC 13.4*  NEUTROABS 12.6*  HGB 12.6  HCT 38.1  MCV 89.9  PLT 183     Recent Labs  Lab 11/01/18 1428  NA 136  K 3.9  CL 105  CO2 22  GLUCOSE 119*  BUN 9  CREATININE 0.62  CALCIUM 8.4*     Recent Labs  Lab 11/01/18 1428  AST 758*  ALT 778*  ALKPHOS 238*  BILITOT 4.9*  PROT 6.6  ALBUMIN 3.5       Cultures:    Component Value Date/Time   SDES  11/01/2018 1446    BLOOD RIGHT ANTECUBITAL Performed at [facility], [name] [address], [place], [place] [address]    [place]  11/01/2018 1446    BOTTLES DRAWN AEROBIC AND ANAEROBIC Blood Culture adequate volume Performed at [facility] [address] [place], [place] [address]    CULT PENDING 11/01/2018 1446   REPTSTATUS PENDING 11/01/2018 1446     Radiological Exams on Admission: Dg Chest 2 View  Result Date: 11/01/2018 CLINICAL DATA:  Abdominal pain, nausea, vomiting EXAM: CHEST - 2 VIEW COMPARISON:  01/13/2017 FINDINGS: Cardiomegaly. Both lungs are clear. The visualized skeletal structures are unremarkable. IMPRESSION: Cardiomegaly without acute abnormality of  the lungs. Electronically Signed   By: Eddie Candle M.D.   On: 11/01/2018 15:36   US Abdomen Complete  Result Date: 11/01/2018 CLINICAL DATA:  85 year old female with abdominal pain and fever for 1 week. EXAM: ABDOMEN ULTRASOUND COMPLETE COMPARISON:  None. FINDINGS: Gallbladder: Mobile gallstones are identified, the largest measuring 7 mm. There is no evidence of gallbladder wall thickening, pericholecystic fluid or sonographic Murphy sign. Common bile duct: Diameter: 5 mm. No intrahepatic or extrahepatic biliary dilatation. Liver: 2 homogeneously hyperechoic LEFT hepatic lesions are identified measuring 0.8 cm and 1 cm. These most likely represent hemangiomas in the absence of primary malignancy. A LEFT hepatic cyst is present. Portal vein is patent on color Doppler imaging with normal direction of blood flow towards the liver. IVC: No abnormality visualized. Pancreas: Visualized portion unremarkable. Spleen: Size and appearance within normal limits. Right Kidney: Length: 9.3 cm. Echogenicity within normal limits. No mass or hydronephrosis visualized. Left Kidney: Length: 10.5 cm. Echogenicity within normal limits. No mass or hydronephrosis visualized. Abdominal aorta: No aneurysm visualized. Other findings: None. IMPRESSION: 1. Cholelithiasis without sonographic evidence of acute cholecystitis. No biliary dilatation. 2. Two hyperechoic LEFT hepatic lesions measuring 0.8 cm and 1 cm. These likely represent hemangiomas. If there is history of primary malignancy, consider elective MRI with and without contrast. 3. No other significant abnormalities. Electronically Signed   By: Margarette Canada M.D.   On: 11/01/2018 17:27    Chart has been reviewed    Assessment/Plan  85 y.o. female with medical history significant of  A.fib, chronic back pain, GERD, HLD,  Admitted for presumed cholangitis and sepsis  Present on Admission:  suspected Cholangitis - await results of MRCP, [facility] to see in AM, Continue  Zosyn, started 11/01/18 IV fluids, IV pain medications   Sepsis ([place]) -   -Patient meets sepsis criteria with   fever     leukocytosis  Tachycardia   Initial lactic acid Lactic Acid, Venous    Component Value Date/Time   LATICACIDVEN 1.9 11/01/2018 1428   Source most likely: cholangitis   -We will rehydrate, treat with IV antibiotics, follow lactic acid - Await results of blood culture and adjust antibiotics as needed    GERD (gastroesophageal reflux disease) - stable chronic  Pure hypercholesterolemia -   Atrial fibrillation (HCC) -         - CHA2DS2 vas score 4 :hold current anticoagulation  Because she may need further procedure such as ERCP        -  Rate control:  Metoprolol      Other plan as per orders.  DVT prophylaxis:  SCD      Code Status:  FULL CODE rescue team if any meds he needs has been a little urine as per patient   I had personally discussed CODE STATUS with patient    Family Communication:   Family not at  Bedside    Disposition Plan:   Patient   to home once workup is complete and patient is stable                                          Consults called: [facility]  Admission status:  ED Disposition    ED Disposition Condition [place]: [place] [100102]  Level of Care: Telemetry [5]  Admit to tele based on following criteria: Other see comments  Comments: tachycardia  Covid Evaluation: N/A  Diagnosis: Cholangitis [576.1.ICD-9-CM]  Admitting Physician: Toy Baker [[id number]]  Attending Physician: Toy Baker [[id number]]  Estimated length of stay: 3 - 4 days  Certification:: I certify this patient will need inpatient services for at least 2 midnights  PT Class (Do Not Modify): Inpatient [101]  PT Acc Code (Do Not Modify): Private [1]         inpatient     Expect 2 midnight stay secondary to severity of patient's current illness including   hemodynamic instability despite optimal  treatment (tachycardia  )  Severe lab/radiological/exam abnormalities including:  elevated LFT's    and extensive comorbidities including: a.fib on Chronic anticoagulation  That are currently affecting medical management.   I expect  patient to be hospitalized for 2 midnights requiring inpatient medical care.  Patient is at high risk for adverse outcome (such as loss of life or disability) if not treated.  Indication for inpatient stay as follows:    severe pain requiring acute inpatient management,  inability to maintain oral hydration    Need for operative/procedural  intervention    Need for IV antibiotics, IV fluids, IV pain medications     Level of care      tele  For  24H    Precautions:  NONE  PPE: Used by the provider:   P100  eye Goggles,  Gloves     Kaelynne Christley 11/01/2018, 9:05 PM    [facility]     after 2 AM  please page floor coverage PA If 7AM-7PM, please contact the day team taking care of the patient using [email or web address]

## 2018-11-01 NOTE — ED Notes (Signed)
ED TO INPATIENT HANDOFF REPORT  ED Nurse Name and Phone #: Tori [phone] RN  S Name/Age/Gender Samantha Foley 85 y.o. female Room/Bed: WA19/WA19  Code Status   Code Status: Prior  Home/SNF/Other Home Patient oriented to: self, place, time and situation Is this baseline? Yes   Triage Complete: Triage complete  Chief Complaint abd pain, fever  Triage Note Pt BIB EMS from home. Pt reports increased abdominal pain x1 week. Pt reports n/v. Temp of 102 at home. Pt has increased weakness x2 weeks. A&O x4. Pt has lose of appetite.  Temp 102.7 BP 140/80 HR 120 CBG 141 RR 30 SpO2 97% RA  18G LFA 1 L of NS    Allergies Allergies  Allergen Reactions  . Ciprofloxacin Shortness Of Breath and Rash  . Scallops [Shellfish Allergy] Anaphylaxis  . Apixaban Other (See Comments)    fatigue  . Demerol Other (See Comments)    Patient states it made her crazy and sleep for 12 hours  . Doxycycline Nausea And Vomiting  . Milk-Related Compounds Diarrhea  . Penicillins Other (See Comments)    unknown  . Rivaroxaban Other (See Comments)    Pt unsure but d/c use  . Sudafed [Pseudoephedrine Hcl] Other (See Comments)    hyperactive    Level of Care/Admitting Diagnosis ED Disposition    ED Disposition Condition Comment   Admit  Hospital Area: [facility] [[id number]]  Level of Care: Telemetry [5]  Admit to tele based on following criteria: Other see comments  Comments: tachycardia  Covid Evaluation: N/A  Diagnosis: Cholangitis [576.1.ICD-9-CM]  Admitting Physician: Toy Baker [[id number]]  Attending Physician: Toy Baker [[id number]]  Estimated length of stay: 3 - 4 days  Certification:: I certify this patient will need inpatient services for at least 2 midnights  PT Class (Do Not Modify): Inpatient [101]  PT Acc Code (Do Not Modify): Private [1]       B Medical/Surgery History Past Medical History:  Diagnosis Date  . Arthritis   . Atrial  fibrillation ([place])    reported but not yet documented  . Chronic back pain    Dr. Tonita Cong  . Chronic knee pain    Dr. Rhona Raider  . Colon polyp    Dr. Wynetta Emery  . Dysrhythmia    atrial fib- mild per pt- no meds  . Food allergy    Scallops--no other seafood allergies  . GERD (gastroesophageal reflux disease)   . Hiatal hernia   . Hyperlipidemia   . Impaired glucose tolerance   . Lactose intolerance   . Osteopenia   . PVC's (premature ventricular contractions)   . Seasonal allergies   . Vision problem    wears contacts   Past Surgical History:  Procedure Laterality Date  . BACK SURGERY     several vertbea ae rcemntekd  . ROBOTIC ASSISTED TOTAL HYSTERECTOMY WITH BILATERAL SALPINGO OOPHERECTOMY Bilateral 03/08/2016   Procedure: ROBOTIC ASSISTED TOTAL HYSTERECTOMY WITH BILATERAL SALPINGO OOPHORECTOMY/Uterosacral Ligament Suspension;  Surgeon: Princess Bruins, MD;  Location: [facility];  Service: Gynecology;  Laterality: Bilateral;  . sebaceous cyst excision  2009   neck  . TONSILLECTOMY AND ADENOIDECTOMY  age 49  . VERTEBROPLASTY  05/2009  . WISDOM TOOTH EXTRACTION       A IV Location/Drains/Wounds Patient Lines/Drains/Airways Status   Active Line/Drains/Airways    Name:   Placement date:   Placement time:   Site:   Days:   Peripheral IV 11/01/18 Left Forearm   11/01/18    -  Forearm   less than 1   Peripheral IV 11/01/18 Right Antecubital   11/01/18    1453    Antecubital   less than 1          Intake/Output Last 24 hours  Intake/Output Summary (Last 24 hours) at 11/01/2018 2059 Last data filed at 11/01/2018 2053 Gross per 24 hour  Intake 1000 ml  Output -  Net 1000 ml    Labs/Imaging Results for orders placed or performed during the hospital encounter of 11/01/18 (from the past 48 hour(s))  Comprehensive metabolic panel     Status: Abnormal   Collection Time: 11/01/18  2:28 PM  Result Value Ref Range   Sodium 136 135 - 145 mmol/L   Potassium 3.9 3.5 - 5.1 mmol/L    Chloride 105 98 - 111 mmol/L   CO2 22 22 - 32 mmol/L   Glucose, Bld 119 (H) 70 - 99 mg/dL   BUN 9 8 - 23 mg/dL   Creatinine, Ser 0.62 0.44 - 1.00 mg/dL   Calcium 8.4 (L) 8.9 - 10.3 mg/dL   Total Protein 6.6 6.5 - 8.1 g/dL   Albumin 3.5 3.5 - 5.0 g/dL   AST 758 (H) 15 - 41 U/L   ALT 778 (H) 0 - 44 U/L   Alkaline Phosphatase 238 (H) 38 - 126 U/L   Total Bilirubin 4.9 (H) 0.3 - 1.2 mg/dL   GFR calc non Af Amer >60 >60 mL/min   GFR calc Af Amer >60 >60 mL/min   Anion gap 9 5 - 15    Comment: Performed at [facility], [place] [address] [place], [place] [address]  Lactic acid, plasma     Status: None   Collection Time: 11/01/18  2:28 PM  Result Value Ref Range   Lactic Acid, Venous 1.9 0.5 - 1.9 mmol/L    Comment: Performed at [facility], [place] [address] [place], [place] [address]  CBC with Differential     Status: Abnormal   Collection Time: 11/01/18  2:28 PM  Result Value Ref Range   WBC 13.4 (H) 4.0 - 10.5 K/uL   RBC 4.24 3.87 - 5.11 MIL/uL   Hemoglobin 12.6 12.0 - 15.0 g/dL   HCT 38.1 36.0 - 46.0 %   MCV 89.9 80.0 - 100.0 fL   MCH 29.7 26.0 - 34.0 pg   MCHC 33.1 30.0 - 36.0 g/dL   RDW 14.0 11.5 - 15.5 %   Platelets 183 150 - 400 K/uL   nRBC 0.0 0.0 - 0.2 %   Neutrophils Relative % 95 %   Neutro Abs 12.6 (H) 1.7 - 7.7 K/uL   Lymphocytes Relative 2 %   Lymphs Abs 0.2 (L) 0.7 - 4.0 K/uL   Monocytes Relative 3 %   Monocytes Absolute 0.5 0.1 - 1.0 K/uL   Eosinophils Relative 0 %   Eosinophils Absolute 0.0 0.0 - 0.5 K/uL   Basophils Relative 0 %   Basophils Absolute 0.0 0.0 - 0.1 K/uL   Immature Granulocytes 0 %   Abs Immature Granulocytes 0.05 0.00 - 0.07 K/uL    Comment: Performed at [facility], [place] [address], [place], [place] [address]  Protime-INR     Status: Abnormal   Collection Time: 11/01/18  2:28 PM  Result Value Ref Range   Prothrombin Time 29.7 (H) 11.4 - 15.2 seconds   INR 2.9 (H) 0.8 - 1.2     Comment: (NOTE) INR goal varies based on device  and disease states. Performed at [facility], [place] [address] [place], [place] [address]   Urinalysis, Routine w reflex microscopic     Status: Abnormal   Collection Time: 11/01/18  2:28 PM  Result Value Ref Range   Color, Urine AMBER (A) YELLOW    Comment: BIOCHEMICALS MAY BE AFFECTED BY COLOR   APPearance HAZY (A) CLEAR   Specific Gravity, Urine 1.013 1.005 - 1.030   pH 7.0 5.0 - 8.0   Glucose, UA NEGATIVE NEGATIVE mg/dL   Hgb urine dipstick NEGATIVE NEGATIVE   Bilirubin Urine NEGATIVE NEGATIVE   Ketones, ur 20 (A) NEGATIVE mg/dL   Protein, ur NEGATIVE NEGATIVE mg/dL   Nitrite NEGATIVE NEGATIVE   Leukocytes,Ua LARGE (A) NEGATIVE   RBC / HPF 6-10 0 - 5 RBC/hpf   WBC, UA >50 (H) 0 - 5 WBC/hpf   Bacteria, UA FEW (A) NONE SEEN   Squamous Epithelial / LPF 21-50 0 - 5   Hyaline Casts, UA PRESENT    Non Squamous Epithelial 0-5 (A) NONE SEEN    Comment: Performed at [facility], [place] [address] [place], [place] [address]  Lipase, blood     Status: None   Collection Time: 11/01/18  2:43 PM  Result Value Ref Range   Lipase 26 11 - 51 U/L    Comment: Performed at [facility], [place] [address], [place], [place] [address]  SARS Coronavirus 2 (CEPHEID - Performed in [place] hospital lab), Hosp Order     Status: None   Collection Time: 11/01/18  2:44 PM  Result Value Ref Range   SARS Coronavirus 2 NEGATIVE NEGATIVE    Comment: (NOTE) If result is NEGATIVE SARS-CoV-2 target nucleic acids are NOT DETECTED. The SARS-CoV-2 RNA is generally detectable in upper and lower  respiratory specimens during the acute phase of infection. The lowest  concentration of SARS-CoV-2 viral copies this assay can detect is 250  copies / mL. A negative result does not preclude SARS-CoV-2 infection  and should not be used as the sole basis for treatment or other  patient management decisions.  A  negative result may occur with  improper specimen collection / handling, submission of specimen other  than nasopharyngeal swab, presence of viral mutation(s) within the  areas targeted by this assay, and inadequate number of viral copies  (<250 copies / mL). A negative result must be combined with clinical  observations, patient history, and epidemiological information. If result is POSITIVE SARS-CoV-2 target nucleic acids are DETECTED. The SARS-CoV-2 RNA is generally detectable in upper and lower  respiratory specimens dur ing the acute phase of infection.  Positive  results are indicative of active infection with SARS-CoV-2.  Clinical  correlation with patient history and other diagnostic information is  necessary to determine patient infection status.  Positive results do  not rule out bacterial infection or co-infection with other viruses. If result is PRESUMPTIVE POSTIVE SARS-CoV-2 nucleic acids MAY BE PRESENT.   A presumptive positive result was obtained on the submitted specimen  and confirmed on repeat testing.  While 2019 novel coronavirus  (SARS-CoV-2) nucleic acids may be present in the submitted sample  additional confirmatory testing may be necessary for epidemiological  and / or clinical management purposes  to differentiate between  SARS-CoV-2 and other Sarbecovirus currently known to infect humans.  If clinically indicated additional testing with an alternate test  methodology [phone]) is advised. The SARS-CoV-2 RNA is generally  detectable in upper and lower  respiratory sp ecimens during the acute  phase of infection. The expected result is Negative. Fact Sheet for Patients:  StrictlyIdeas.no Fact Sheet for Healthcare Providers: BankingDealers.co.za This test is not yet approved or cleared by the [place] FDA and has been authorized for detection and/or diagnosis of SARS-CoV-2 by FDA under an Emergency Use  Authorization (EUA).  This EUA will remain in effect (meaning this test can be used) for the duration of the COVID-19 declaration under Section 564(b)(1) of the Act, 21 U.S.C. section 360bbb-3(b)(1), unless the authorization is terminated or revoked sooner. Performed at [facility], [name] [address], [place], [place] [address]   Culture, blood (Routine x 2)     Status: None (Preliminary result)   Collection Time: 11/01/18  2:46 PM  Result Value Ref Range   Specimen Description      BLOOD RIGHT ANTECUBITAL Performed at [facility], [place] [address], [place], [place] [address]    Special Requests      BOTTLES DRAWN AEROBIC AND ANAEROBIC Blood Culture adequate volume Performed at [employer] [address], [place], [place] [address]    Culture PENDING    Report Status PENDING    Dg Chest 2 View  Result Date: 11/01/2018 CLINICAL DATA:  Abdominal pain, nausea, vomiting EXAM: CHEST - 2 VIEW COMPARISON:  01/13/2017 FINDINGS: Cardiomegaly. Both lungs are clear. The visualized skeletal structures are unremarkable. IMPRESSION: Cardiomegaly without acute abnormality of the lungs. Electronically Signed   By: Eddie Candle M.D.   On: 11/01/2018 15:36   US Abdomen Complete  Result Date: 11/01/2018 CLINICAL DATA:  85 year old female with abdominal pain and fever for 1 week. EXAM: ABDOMEN ULTRASOUND COMPLETE COMPARISON:  None. FINDINGS: Gallbladder: Mobile gallstones are identified, the largest measuring 7 mm. There is no evidence of gallbladder wall thickening, pericholecystic fluid or sonographic Murphy sign. Common bile duct: Diameter: 5 mm. No intrahepatic or extrahepatic biliary dilatation. Liver: 2 homogeneously hyperechoic LEFT hepatic lesions are identified measuring 0.8 cm and 1 cm. These most likely represent hemangiomas in the absence of primary malignancy. A LEFT hepatic cyst is present. Portal vein is patent on color Doppler imaging with normal  direction of blood flow towards the liver. IVC: No abnormality visualized. Pancreas: Visualized portion unremarkable. Spleen: Size and appearance within normal limits. Right Kidney: Length: 9.3 cm. Echogenicity within normal limits. No mass or hydronephrosis visualized. Left Kidney: Length: 10.5 cm. Echogenicity within normal limits. No mass or hydronephrosis visualized. Abdominal aorta: No aneurysm visualized. Other findings: None. IMPRESSION: 1. Cholelithiasis without sonographic evidence of acute cholecystitis. No biliary dilatation. 2. Two hyperechoic LEFT hepatic lesions measuring 0.8 cm and 1 cm. These likely represent hemangiomas. If there is history of primary malignancy, consider elective MRI with and without contrast. 3. No other significant abnormalities. Electronically Signed   By: Margarette Canada M.D.   On: 11/01/2018 17:27    Pending Labs Unresulted Labs (From admission, onward)    Start     Ordered   11/01/18 1955  Troponin I - Add-On to previous collection  Add-on,   R     11/01/18 1954   11/01/18 1428  Culture, blood (Routine x 2)  BLOOD CULTURE X 2,   STAT     11/01/18 1427   Signed and Held  Lactic acid, plasma  STAT Now then every 3 hours,   STAT     Signed and Held   Signed and Held  Procalcitonin  Add-on,   R  Signed and Held   Signed and Held  APTT  Tomorrow morning,   R     Signed and Held   Signed and Held  Protime-INR  Tomorrow morning,   R     Signed and Held   Signed and Held  Magnesium  Tomorrow morning,   R    Comments:  Call MD if <1.5    Signed and Held   Signed and Held  Phosphorus  Tomorrow morning,   R     Signed and Held   Signed and Held  TSH  Once,   R    Comments:  Cancel if already done within 1 month and notify MD    Signed and Held   Signed and Held  Comprehensive metabolic panel  Once,   R    Comments:  Cal MD for K<3.5 or >5.0    Signed and Held   Signed and Held  CBC  Once,   R    Comments:  Call for hg <8.0    Signed and Held           Vitals/Pain Today's Vitals   11/01/18 2011 11/01/18 2031 11/01/18 2048 11/01/18 2053  BP: 123/72 (!) 88/77  114/64  Pulse: 95 87 71 92  Resp: (!) 25 14 18 16   Temp:    98.8 F (37.1 C)  TempSrc:    Oral  SpO2: 99% 97% 100% 99%  Weight:      PainSc:        Isolation Precautions No active isolations  Medications Medications  sodium chloride flush (NS) 0.9 % injection 3 mL (3 mLs Intravenous Given 11/01/18 1453)  piperacillin-tazobactam (ZOSYN) IVPB 3.375 g (0 g Intravenous Stopped 11/01/18 1822)  sodium chloride 0.9 % bolus 1,000 mL (0 mLs Intravenous Stopped 11/01/18 2053)  gadobutrol (GADAVIST) 1 MMOL/ML injection 7 mL (7 mLs Intravenous Contrast Given 11/01/18 1946)    Mobility walks with device Moderate fall risk   Focused Assessments Cardiac Assessment Handoff:    Lab Results  Component Value Date   TROPONINI <0.30 12/16/2013   No results found for: DDIMER Does the Patient currently have chest pain? No     R Recommendations: See Admitting Provider Note  Report given to:   Additional Notes: fever with abd pain

## 2018-11-01 NOTE — ED Notes (Signed)
US at bedside

## 2018-11-01 NOTE — ED Notes (Signed)
Bed: FP69 Expected date:  Expected time:  Means of arrival:  Comments: 87 malaise, fever

## 2018-11-01 NOTE — ED Notes (Signed)
Pt transferred to MRI by this RN

## 2018-11-01 NOTE — ED Provider Notes (Signed)
[facility] DEPT Provider Note   CSN: [id number] Arrival date & time: 11/01/18  1420    History   Chief Complaint Chief Complaint  Patient presents with  . Abdominal Pain  . Fever    HPI Samantha Foley is a 85 y.o. female.     HPI Patient presents with upper abdominal pain.  Has had the abdominal pain over the last few weeks.  States she thinks she has gallbladder problems.  States the pain gets worse when she eats fatty food.  States she has stopped eating fatty foods.  However yesterday developed a fever.  Pain is in her right upper abdomen.  Decreased appetite.  No cough.  No dysuria.  Had one episode of vomiting a few days ago but none now.  No diarrhea.. Past Medical History:  Diagnosis Date  . Arthritis   . Atrial fibrillation ([place])    reported but not yet documented  . Chronic back pain    Dr. Tonita Cong  . Chronic knee pain    Dr. Rhona Raider  . Colon polyp    Dr. Wynetta Emery  . Dysrhythmia    atrial fib- mild per pt- no meds  . Food allergy    Scallops--no other seafood allergies  . GERD (gastroesophageal reflux disease)   . Hiatal hernia   . Hyperlipidemia   . Impaired glucose tolerance   . Lactose intolerance   . Osteopenia   . PVC's (premature ventricular contractions)   . Seasonal allergies   . Vision problem    wears contacts    Patient Active Problem List   Diagnosis Date Noted  . Encounter for therapeutic drug monitoring 04/15/2017  . S/P total hysterectomy - [place] 03/09/2016  . Postoperative state 03/08/2016  . Age-related macular degeneration 06/28/2015  . Disorder of rotator cuff 06/28/2015  . Degenerative drusen 07/11/2014  . Chest pain 12/16/2013  . Shoulder injury 12/18/2012  . Lipoma of neck 02/27/2012  . GERD (gastroesophageal reflux disease) 08/08/2011  . Impaired fasting glucose 08/08/2011  . Pure hypercholesterolemia 08/08/2011  . Osteopenia 08/08/2011  . Atrial fibrillation ([place]) 08/08/2011    Past  Surgical History:  Procedure Laterality Date  . BACK SURGERY     several vertbea ae rcemntekd  . ROBOTIC ASSISTED TOTAL HYSTERECTOMY WITH BILATERAL SALPINGO OOPHERECTOMY Bilateral 03/08/2016   Procedure: ROBOTIC ASSISTED TOTAL HYSTERECTOMY WITH BILATERAL SALPINGO OOPHORECTOMY/Uterosacral Ligament Suspension;  Surgeon: Princess Bruins, MD;  Location: [facility];  Service: Gynecology;  Laterality: Bilateral;  . sebaceous cyst excision  2009   neck  . TONSILLECTOMY AND ADENOIDECTOMY  age 54  . VERTEBROPLASTY  05/2009  . WISDOM TOOTH EXTRACTION       OB History   No obstetric history on file.      Home Medications    Prior to Admission medications   Medication Sig Start Date End Date Taking? Authorizing Provider  Cyanocobalamin (VITAMIN B-12 PO) Take 1 tablet by mouth daily.    [provider]  diltiazem (CARDIZEM) 30 MG tablet Take 1 tablet every 4 hours AS NEEDED for AFIB  heart rate >100 0. Patient not taking: Reported on 05/29/2018 08/01/17   Sherran Needs, NP  Glucosamine-Chondroitin (GLUCOSAMINE CHONDR COMPLEX PO) Take 1 tablet by mouth daily. 1500mg  glucosamine, 1200mg  chondroitin    [provider]  metoprolol tartrate (LOPRESSOR) 25 MG tablet TAKE 1/2 TABLET BY MOUTH 2 TIMES DAILY 07/21/18   Sherran Needs, NP  omeprazole (PRILOSEC) 20 MG capsule Take 20 mg by mouth daily.  [provider]  oxyCODONE-acetaminophen (PERCOCET/ROXICET) 5-325 MG tablet Take 1 tablet by mouth every 6 (six) hours as needed for severe pain. 04/21/17   Langston Masker B, PA-C  warfarin (COUMADIN) 1 MG tablet TAKE 1-1 1/2 TABLETS DAILY AS DIRECTED PER COUMADIN CLINIC 09/01/18   Deboraha Sprang, MD    Family History Family History  Problem Relation Age of Onset  . Rheumatic fever Mother   . Diabetes Mother   . Heart disease Mother        rheumatic heart disease  . Cancer Sister 47       breast cancer  . Cancer Brother        bladder and prostate cancer  . Migraines  Daughter   . Cancer Sister        leukemia at 59; colon cancer in her late 30's--family ?'s dx    Social History Social History   Tobacco Use  . Smoking status: Never Smoker  . Smokeless tobacco: Never Used  Substance Use Topics  . Alcohol use: No    Comment: maybe one glass per year.  . Drug use: No     Allergies   Ciprofloxacin; Scallops [shellfish allergy]; Apixaban; Demerol; Doxycycline; Milk-related compounds; Penicillins; Rivaroxaban; and Sudafed [pseudoephedrine hcl]   Review of Systems Review of Systems  Constitutional: Positive for appetite change and fever.  HENT: Negative for congestion.   Cardiovascular: Negative for chest pain.  Gastrointestinal: Positive for abdominal pain and nausea.  Genitourinary: Negative for flank pain.  Musculoskeletal: Negative for back pain.  Skin: Negative for rash.  Neurological: Negative for weakness.  Psychiatric/Behavioral: Negative for confusion.     Physical Exam Updated Vital Signs BP 115/70   Pulse 92   Temp (!) 100.4 F (38 C) (Oral)   Resp 19   Wt 71.9 kg   SpO2 96%   BMI 28.99 kg/m   Physical Exam Vitals signs and nursing note reviewed.  HENT:     Head: Atraumatic.  Cardiovascular:     Rate and Rhythm: Tachycardia present.  Pulmonary:     Breath sounds: Normal breath sounds.  Abdominal:     Tenderness: Negative signs include Murphy's sign.     Hernia: No hernia is present.     Comments: Mild right upper abdominal tenderness.  Not necessarily tender over gallbladder however.no hernia palpated.  No CVA tenderness.    Skin:    General: Skin is warm.     Capillary Refill: Capillary refill takes less than 2 seconds.  Neurological:     Mental Status: She is alert and oriented to person, place, and time.      ED Treatments / Results  Labs (all labs ordered are listed, but only abnormal results are displayed) Labs Reviewed  COMPREHENSIVE METABOLIC PANEL - Abnormal; Notable for the following components:       Result Value   Glucose, Bld 119 (*)    Calcium 8.4 (*)    AST 758 (*)    ALT 778 (*)    Alkaline Phosphatase 238 (*)    Total Bilirubin 4.9 (*)    All other components within normal limits  CBC WITH DIFFERENTIAL/PLATELET - Abnormal; Notable for the following components:   WBC 13.4 (*)    Neutro Abs 12.6 (*)    Lymphs Abs 0.2 (*)    All other components within normal limits  PROTIME-INR - Abnormal; Notable for the following components:   Prothrombin Time 29.7 (*)    INR 2.9 (*)  All other components within normal limits  URINALYSIS, ROUTINE W REFLEX MICROSCOPIC - Abnormal; Notable for the following components:   Color, Urine AMBER (*)    APPearance HAZY (*)    Ketones, ur 20 (*)    Leukocytes,Ua LARGE (*)    WBC, UA >50 (*)    Bacteria, UA FEW (*)    Non Squamous Epithelial 0-5 (*)    All other components within normal limits  CULTURE, BLOOD (ROUTINE X 2)  CULTURE, BLOOD (ROUTINE X 2)  SARS CORONAVIRUS 2 (HOSPITAL ORDER, [facility])  LACTIC ACID, PLASMA  LIPASE, BLOOD    EKG None  Radiology Dg Chest 2 View  Result Date: 11/01/2018 CLINICAL DATA:  Abdominal pain, nausea, vomiting EXAM: CHEST - 2 VIEW COMPARISON:  01/13/2017 FINDINGS: Cardiomegaly. Both lungs are clear. The visualized skeletal structures are unremarkable. IMPRESSION: Cardiomegaly without acute abnormality of the lungs. Electronically Signed   By: Eddie Candle M.D.   On: 11/01/2018 15:36    Procedures Procedures (including critical care time)  Medications Ordered in ED Medications  sodium chloride flush (NS) 0.9 % injection 3 mL (3 mLs Intravenous Given 11/01/18 1453)     Initial Impression / Assessment and Plan / ED Course  I have reviewed the triage vital signs and the nursing notes.  Pertinent labs & imaging results that were available during my care of the patient were reviewed by me and considered in my medical decision making (see chart for details).         Patient with upper abdominal pain.  Reportedly having fevers.  White count somewhat elevated at 13.  However LFTs are elevated.  She is having biliary type symptoms at home even preceding the fevers.  Does not appear ill at this time.  Will get ultrasound to further evaluate.  Ascending cholangitis considered but does not appear very ill at this time.  Care will be turned over to Dr. Wilson Singer.  Final Clinical Impressions(s) / ED Diagnoses   Final diagnoses:  None    ED Discharge Orders    None       Davonna Belling, MD 11/01/18 [phone]

## 2018-11-01 NOTE — ED Triage Notes (Addendum)
Pt BIB EMS from home. Pt reports increased abdominal pain x1 week. Pt reports n/v. Temp of 102 at home. Pt has increased weakness x2 weeks. A&O x4. Pt has lose of appetite.  Temp 102.7 BP 140/80 HR 120 CBG 141 RR 30 SpO2 97% RA  18G LFA 1 L of NS

## 2018-11-01 NOTE — ED Provider Notes (Signed)
Assumed care in sign out. Clinical concern for possible cholangitis. [place] did not show dilation of CBD though. Zosyn ordered. Discussed with Dr Therisa Doyne, GI. Requesting MRCP. Will discuss with medicine for admission.    Virgel Manifold, MD 11/01/18 [phone]

## 2018-11-01 NOTE — ED Notes (Signed)
Patient transported to X-ray 

## 2018-11-02 DIAGNOSIS — R933 Abnormal findings on diagnostic imaging of other parts of digestive tract: Secondary | ICD-10-CM

## 2018-11-02 LAB — BLOOD CULTURE ID PANEL (REFLEXED)

## 2018-11-02 LAB — COMPREHENSIVE METABOLIC PANEL
ALT: 510 U/L — ABNORMAL HIGH (ref 0–44)
AST: 314 U/L — ABNORMAL HIGH (ref 15–41)
Albumin: 3 g/dL — ABNORMAL LOW (ref 3.5–5.0)
Alkaline Phosphatase: 180 U/L — ABNORMAL HIGH (ref 38–126)
Anion gap: 10 (ref 5–15)
BUN: 9 mg/dL (ref 8–23)
CO2: 19 mmol/L — ABNORMAL LOW (ref 22–32)
Calcium: 8.2 mg/dL — ABNORMAL LOW (ref 8.9–10.3)
Chloride: 110 mmol/L (ref 98–111)
Creatinine, Ser: 0.56 mg/dL (ref 0.44–1.00)
GFR calc Af Amer: >60 mL/min (ref >60)
GFR calc non Af Amer: >60 mL/min (ref >60)
Glucose, Bld: 76 mg/dL (ref 70–99)
Potassium: 3.8 mmol/L (ref 3.5–5.1)
Sodium: 139 mmol/L (ref 135–145)
Total Bilirubin: 2.5 mg/dL — ABNORMAL HIGH (ref 0.3–1.2)
Total Protein: 5.7 g/dL — ABNORMAL LOW (ref 6.5–8.1)

## 2018-11-02 LAB — CBC WITH DIFFERENTIAL/PLATELET
Abs Immature Granulocytes: 0.03 10*3/uL (ref 0.00–0.07)
Basophils Absolute: 0 10*3/uL (ref 0.0–0.1)
Basophils Relative: 0 %
Eosinophils Absolute: 0 10*3/uL (ref 0.0–0.5)
Eosinophils Relative: 0 %
HCT: 35.2 % — ABNORMAL LOW (ref 36.0–46.0)
Hemoglobin: 11.4 g/dL — ABNORMAL LOW (ref 12.0–15.0)
Immature Granulocytes: 0 %
Lymphocytes Relative: 4 %
Lymphs Abs: 0.4 10*3/uL — ABNORMAL LOW (ref 0.7–4.0)
MCH: 29.5 pg (ref 26.0–34.0)
MCHC: 32.4 g/dL (ref 30.0–36.0)
MCV: 91 fL (ref 80.0–100.0)
Monocytes Absolute: 0.5 10*3/uL (ref 0.1–1.0)
Monocytes Relative: 7 %
Neutro Abs: 7.1 10*3/uL (ref 1.7–7.7)
Neutrophils Relative %: 89 %
Platelets: 153 10*3/uL (ref 150–400)
RBC: 3.87 MIL/uL (ref 3.87–5.11)
RDW: 14.7 % (ref 11.5–15.5)
WBC: 8.1 10*3/uL (ref 4.0–10.5)
nRBC: 0 % (ref 0.0–0.2)

## 2018-11-02 LAB — CBC
HCT: 34.6 % — ABNORMAL LOW (ref 36.0–46.0)
Hemoglobin: 11 g/dL — ABNORMAL LOW (ref 12.0–15.0)
MCH: 29 pg (ref 26.0–34.0)
MCHC: 31.8 g/dL (ref 30.0–36.0)
MCV: 91.3 fL (ref 80.0–100.0)
Platelets: 174 10*3/uL (ref 150–400)
RBC: 3.79 MIL/uL — ABNORMAL LOW (ref 3.87–5.11)
RDW: 14.5 % (ref 11.5–15.5)
WBC: 9 10*3/uL (ref 4.0–10.5)
nRBC: 0 % (ref 0.0–0.2)

## 2018-11-02 LAB — LACTIC ACID, PLASMA
Lactic Acid, Venous: 0.8 mmol/L (ref 0.5–1.9)
Lactic Acid, Venous: 0.9 mmol/L (ref 0.5–1.9)
Lactic Acid, Venous: 1.2 mmol/L (ref 0.5–1.9)
Lactic Acid, Venous: 1.4 mmol/L (ref 0.5–1.9)

## 2018-11-02 LAB — GLUCOSE, CAPILLARY: Glucose-Capillary: 83 mg/dL (ref 70–99)

## 2018-11-02 LAB — MAGNESIUM: Magnesium: 2.1 mg/dL (ref 1.7–2.4)

## 2018-11-02 LAB — PROTIME-INR
INR: 3.2 — ABNORMAL HIGH (ref 0.8–1.2)
Prothrombin Time: 32.6 seconds — ABNORMAL HIGH (ref 11.4–15.2)

## 2018-11-02 LAB — PROCALCITONIN: Procalcitonin: 2.11 ng/mL

## 2018-11-02 LAB — TSH: TSH: 0.532 u[IU]/mL (ref 0.350–4.500)

## 2018-11-02 LAB — MRSA PCR SCREENING: MRSA by PCR: NEGATIVE

## 2018-11-02 LAB — PHOSPHORUS: Phosphorus: 2.4 mg/dL — ABNORMAL LOW (ref 2.5–4.6)

## 2018-11-02 LAB — APTT: aPTT: 48 seconds — ABNORMAL HIGH (ref 24–36)

## 2018-11-02 MED ORDER — METOPROLOL SUCCINATE ER 25 MG PO TB24
12.5000 mg | ORAL_TABLET | Freq: Every day | ORAL | Status: DC
  Administered 2018-11-02: 12.5 mg via ORAL
  Filled 2018-11-02: qty 1

## 2018-11-02 MED ORDER — METOPROLOL TARTRATE 25 MG PO TABS
12.5000 mg | ORAL_TABLET | Freq: Once | ORAL | Status: AC
  Administered 2018-11-02: 12.5 mg via ORAL
  Filled 2018-11-02: qty 1

## 2018-11-02 MED ORDER — PANTOPRAZOLE SODIUM 40 MG IV SOLR
40.0000 mg | Freq: Two times a day (BID) | INTRAVENOUS | Status: DC
  Administered 2018-11-02 – 2018-11-06 (×9): 40 mg via INTRAVENOUS
  Filled 2018-11-02 (×9): qty 40

## 2018-11-02 MED ORDER — SODIUM CHLORIDE 0.9 % IV SOLN
INTRAVENOUS | Status: DC
  Administered 2018-11-02: 14:00:00 via INTRAVENOUS

## 2018-11-02 MED ORDER — SODIUM CHLORIDE 0.9 % IV SOLN: INTRAVENOUS | Status: DC

## 2018-11-02 MED ORDER — PIPERACILLIN-TAZOBACTAM 3.375 G IVPB
3.3750 g | Freq: Three times a day (TID) | INTRAVENOUS | Status: DC
  Administered 2018-11-02 – 2018-11-04 (×8): 3.375 g via INTRAVENOUS
  Filled 2018-11-02 (×7): qty 50

## 2018-11-02 MED ORDER — DILTIAZEM HCL 100 MG IV SOLR
5.0000 mg/h | INTRAVENOUS | Status: DC
  Administered 2018-11-02 – 2018-11-05 (×4): 5 mg/h via INTRAVENOUS
  Filled 2018-11-02 (×6): qty 100

## 2018-11-02 MED ORDER — ALUM & MAG HYDROXIDE-SIMETH 200-200-20 MG/5ML PO SUSP
15.0000 mL | ORAL | Status: DC | PRN
  Administered 2018-11-02: 15 mL via ORAL
  Filled 2018-11-02: qty 30

## 2018-11-02 MED ORDER — DILTIAZEM HCL 100 MG IV SOLR: 5.0000 mg/h | INTRAVENOUS | Status: DC

## 2018-11-02 MED ORDER — CHLORHEXIDINE GLUCONATE CLOTH 2 % EX PADS
6.0000 | MEDICATED_PAD | Freq: Every day | CUTANEOUS | Status: DC
  Administered 2018-11-03 – 2018-11-06 (×4): 6 via TOPICAL

## 2018-11-02 MED ORDER — METOPROLOL TARTRATE 5 MG/5ML IV SOLN
2.5000 mg | Freq: Four times a day (QID) | INTRAVENOUS | Status: DC
  Filled 2018-11-02: qty 5

## 2018-11-02 MED ORDER — METOPROLOL SUCCINATE ER 25 MG PO TB24
25.0000 mg | ORAL_TABLET | Freq: Every day | ORAL | Status: DC
  Administered 2018-11-03 – 2018-11-05 (×3): 25 mg via ORAL
  Filled 2018-11-02 (×3): qty 1

## 2018-11-02 MED ORDER — METOPROLOL TARTRATE 5 MG/5ML IV SOLN
INTRAVENOUS | Status: AC
  Administered 2018-11-02: 2.5 mg
  Filled 2018-11-02: qty 5

## 2018-11-02 NOTE — Progress Notes (Signed)
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 Therisa Doyne Defer to surgery She is having belching and abdominal pain and refuses medication-give K pad, Protonix and I have asked that nursing reach back out to surgeon to discuss further management Splenic cysts Coincidental at this time Atrial fibrillation, chads score >3/Coumadin Rate is not controlled-I gave 12.5 XL metoprolol in addition to her twice daily metoprolol 12.5 Will titrate meds as needed keep on monitors indefinitely Check magnesium in addition to potassium in a.m. Hiatal hernia Monitor Uterine prolapse status post repair Pathological grieving Home alone We will get PT to see dependent on nursing mobility   DVT prophylaxis-on Coumadin which has been held will need heparin when INR drops below 2 Full code Inpatient No family present   Verlon Au, MD  [facility] [place] [email or web address] 7PM-7AM contact night coverage as above 11/02/2018, 7:28 AM  LOS: 1 day   Consultants:  Gastroenterology  General surgery  Procedures:  MRCP, CT scan  Antimicrobials:  Zosyn  Interval history/Subjective: Awake alert some discomfort reported Has not really tolerated  clears well No actual nausea or vomiting No diarrhea Passing gas   Objective:  Vitals:  Vitals:   11/02/18 0013 11/02/18 0428  BP: (!) 123/53 (!) 127/54  Pulse: 68 79  Resp: 14 19  Temp: 99.3 F (37.4 C) (!) 100.4 F (38 C)  SpO2: 99% 95%    Exam:  EOMI NCAT no icterus no pallor S1-S2 tachycardic rates 120s A. fib Chest clear Abdomen soft no right upper quadrant tenderness however she does have some distention No lower extremity edema Euthymic congruent very coherent   I have personally reviewed the following:  DATA   Labs:  AST/ALT 758/778 down to 3 one 4/510  Magnesium 2.1  CO2 19  Lactic acid 1.9-->0.8  INR 3.2  Imaging studies: MRCP 5/9 1. No biliary duct dilatation.  No choledocholithiasis. 2. Gallstones without evidence of cholecystitis. 3. Simple hepatic cysts and probable hemangioma in the RIGHT hepatic lobe. 4. Normal pancreas. 5. Multiple splenic lesions with one dominant 3 cm lesion are indeterminate. However given no significant change from CT 11/09/2015, favor benign lesions.  Medical tests:  Yes  Test discussed with performing physician:  Case discussed with both Dr. Therisa Doyne and Dr. Marlou Starks this morning  Scheduled Meds: . metoprolol tartrate  12.5 mg Oral BID   Continuous Infusions: . sodium chloride 100 mL/hr at 11/01/18 2150  . piperacillin-tazobactam (ZOSYN)  IV 3.375 g (11/02/18 5465)    Active Problems:   GERD (gastroesophageal reflux disease)   Pure hypercholesterolemia   Atrial fibrillation ([place])   Cholangitis   Sepsis ([address])   LOS: 1 day

## 2018-11-02 NOTE — Consult Note (Signed)
Reason for Consult:elevated lft's Referring Physician: Dr. [name] is an 85 y.o. female.  HPI: The patient is an 85 year old female who presents with feelings of malaise and burping for the last few weeks.  She has not had much of an appetite.  She denies any significant abdominal pain.  She had one episode of vomiting during the middle of last week.  She denies any fevers or chills.  She had an MRCP that shows stones in the gallbladder but no gallbladder wall thickening or ductal dilatation and no common duct stone.  Her liver functions are elevated.  Past Medical History:  Diagnosis Date  . Arthritis   . Atrial fibrillation ([place])    reported but not yet documented  . Chronic back pain    Dr. Tonita Cong  . Chronic knee pain    Dr. Rhona Raider  . Colon polyp    Dr. Wynetta Emery  . Dysrhythmia    atrial fib- mild per pt- no meds  . Food allergy    Scallops--no other seafood allergies  . GERD (gastroesophageal reflux disease)   . Hiatal hernia   . Hyperlipidemia   . Impaired glucose tolerance   . Lactose intolerance   . Osteopenia   . PVC's (premature ventricular contractions)   . Seasonal allergies   . Vision problem    wears contacts    Past Surgical History:  Procedure Laterality Date  . BACK SURGERY     several vertbea ae rcemntekd  . ROBOTIC ASSISTED TOTAL HYSTERECTOMY WITH BILATERAL SALPINGO OOPHERECTOMY Bilateral 03/08/2016   Procedure: ROBOTIC ASSISTED TOTAL HYSTERECTOMY WITH BILATERAL SALPINGO OOPHORECTOMY/Uterosacral Ligament Suspension;  Surgeon: Princess Bruins, MD;  Location: [facility];  Service: Gynecology;  Laterality: Bilateral;  . sebaceous cyst excision  2009   neck  . TONSILLECTOMY AND ADENOIDECTOMY  age 46  . VERTEBROPLASTY  05/2009  . WISDOM TOOTH EXTRACTION      Family History  Problem Relation Age of Onset  . Rheumatic fever Mother   . Diabetes Mother   . Heart disease Mother        rheumatic heart disease  . Cancer Sister 36       breast  cancer  . Cancer Brother        bladder and prostate cancer  . Migraines Daughter   . Cancer Sister        leukemia at 70; colon cancer in her late 30's--family ?'s dx    Social History:  reports that she has never smoked. She has never used smokeless tobacco. She reports that she does not drink alcohol or use drugs.  Allergies:  Allergies  Allergen Reactions  . Ciprofloxacin Shortness Of Breath and Rash  . Scallops [Shellfish Allergy] Anaphylaxis  . Apixaban Other (See Comments)    fatigue  . Cherry Other (See Comments)    Vertigo  . Demerol Other (See Comments)    Patient states it made her crazy and sleep for 12 hours  . Doxycycline Nausea And Vomiting  . Milk-Related Compounds Diarrhea  . Penicillins Other (See Comments)    Did it involve swelling of the face/tongue/throat, SOB, or low BP? Unknown Did it involve sudden or severe rash/hives, skin peeling, or any reaction on the inside of your mouth or nose? Unknown Did you need to seek medical attention at a hospital or doctor's office? Unknown When did it last happen? If all above answers are "NO", may proceed with cephalosporin use.   . Rivaroxaban Other (See Comments)  Pt unsure but d/c use  . Sudafed [Pseudoephedrine Hcl] Other (See Comments)    hyperactive    Medications: I have reviewed the patient's current medications.  Results for orders placed or performed during the hospital encounter of 11/01/18 (from the past 48 hour(s))  Comprehensive metabolic panel     Status: Abnormal   Collection Time: 11/01/18  2:28 PM  Result Value Ref Range   Sodium 136 135 - 145 mmol/L   Potassium 3.9 3.5 - 5.1 mmol/L   Chloride 105 98 - 111 mmol/L   CO2 22 22 - 32 mmol/L   Glucose, Bld 119 (H) 70 - 99 mg/dL   BUN 9 8 - 23 mg/dL   Creatinine, Ser 0.62 0.44 - 1.00 mg/dL   Calcium 8.4 (L) 8.9 - 10.3 mg/dL   Total Protein 6.6 6.5 - 8.1 g/dL   Albumin 3.5 3.5 - 5.0 g/dL   AST 758 (H) 15 - 41 U/L   ALT 778 (H) 0 - 44  U/L   Alkaline Phosphatase 238 (H) 38 - 126 U/L   Total Bilirubin 4.9 (H) 0.3 - 1.2 mg/dL   GFR calc non Af Amer >60 >60 mL/min   GFR calc Af Amer >60 >60 mL/min   Anion gap 9 5 - 15    Comment: Performed at [facility], [place] [address], [place], [place] [address]  Lactic acid, plasma     Status: None   Collection Time: 11/01/18  2:28 PM  Result Value Ref Range   Lactic Acid, Venous 1.9 0.5 - 1.9 mmol/L    Comment: Performed at [facility], [place] [address], [place], [place] [address]  CBC with Differential     Status: Abnormal   Collection Time: 11/01/18  2:28 PM  Result Value Ref Range   WBC 13.4 (H) 4.0 - 10.5 K/uL   RBC 4.24 3.87 - 5.11 MIL/uL   Hemoglobin 12.6 12.0 - 15.0 g/dL   HCT 38.1 36.0 - 46.0 %   MCV 89.9 80.0 - 100.0 fL   MCH 29.7 26.0 - 34.0 pg   MCHC 33.1 30.0 - 36.0 g/dL   RDW 14.0 11.5 - 15.5 %   Platelets 183 150 - 400 K/uL   nRBC 0.0 0.0 - 0.2 %   Neutrophils Relative % 95 %   Neutro Abs 12.6 (H) 1.7 - 7.7 K/uL   Lymphocytes Relative 2 %   Lymphs Abs 0.2 (L) 0.7 - 4.0 K/uL   Monocytes Relative 3 %   Monocytes Absolute 0.5 0.1 - 1.0 K/uL   Eosinophils Relative 0 %   Eosinophils Absolute 0.0 0.0 - 0.5 K/uL   Basophils Relative 0 %   Basophils Absolute 0.0 0.0 - 0.1 K/uL   Immature Granulocytes 0 %   Abs Immature Granulocytes 0.05 0.00 - 0.07 K/uL    Comment: Performed at [facility], [place] [address], [place], [place] [address]  Protime-INR     Status: Abnormal   Collection Time: 11/01/18  2:28 PM  Result Value Ref Range   Prothrombin Time 29.7 (H) 11.4 - 15.2 seconds   INR 2.9 (H) 0.8 - 1.2    Comment: (NOTE) INR goal varies based on device and disease states. Performed at [facility], [place] [address], [place], [place] [address]   Urinalysis, Routine w reflex microscopic     Status: Abnormal   Collection Time: 11/01/18  2:28 PM  Result Value Ref Range   Color, Urine AMBER (A)  YELLOW    Comment: BIOCHEMICALS MAY BE AFFECTED BY COLOR   APPearance HAZY (A) CLEAR   Specific Gravity, Urine 1.013 1.005 - 1.030   pH 7.0 5.0 - 8.0   Glucose, UA NEGATIVE NEGATIVE mg/dL   Hgb urine dipstick NEGATIVE NEGATIVE   Bilirubin Urine NEGATIVE NEGATIVE   Ketones, ur 20 (A) NEGATIVE mg/dL   Protein, ur NEGATIVE NEGATIVE mg/dL   Nitrite NEGATIVE NEGATIVE   Leukocytes,Ua LARGE (A) NEGATIVE   RBC / HPF 6-10 0 - 5 RBC/hpf   WBC, UA >50 (H) 0 - 5 WBC/hpf   Bacteria, UA FEW (A) NONE SEEN   Squamous Epithelial / LPF 21-50 0 - 5   Hyaline Casts, UA PRESENT    Non Squamous Epithelial 0-5 (A) NONE SEEN    Comment: Performed at [facility], [place] [address], [place], [place] [address]  Lipase, blood     Status: None   Collection Time: 11/01/18  2:43 PM  Result Value Ref Range   Lipase 26 11 - 51 U/L    Comment: Performed at [facility], [place] [address], [place], [place] [address]  SARS Coronavirus 2 (CEPHEID - Performed in [place] hospital lab), Hosp Order     Status: None   Collection Time: 11/01/18  2:44 PM  Result Value Ref Range   SARS Coronavirus 2 NEGATIVE NEGATIVE    Comment: (NOTE) If result is NEGATIVE SARS-CoV-2 target nucleic acids are NOT DETECTED. The SARS-CoV-2 RNA is generally detectable in upper and lower  respiratory specimens during the acute phase of infection. The lowest  concentration of SARS-CoV-2 viral copies this assay can detect is 250  copies / mL. A negative result does not preclude SARS-CoV-2 infection  and should not be used as the sole basis for treatment or other  patient management decisions.  A negative result may occur with  improper specimen collection / handling, submission of specimen other  than nasopharyngeal swab, presence of viral mutation(s) within the  areas targeted by this assay, and inadequate number of viral copies  (<250 copies / mL). A negative result must be combined with clinical   observations, patient history, and epidemiological information. If result is POSITIVE SARS-CoV-2 target nucleic acids are DETECTED. The SARS-CoV-2 RNA is generally detectable in upper and lower  respiratory specimens dur ing the acute phase of infection.  Positive  results are indicative of active infection with SARS-CoV-2.  Clinical  correlation with patient history and other diagnostic information is  necessary to determine patient infection status.  Positive results do  not rule out bacterial infection or co-infection with other viruses. If result is PRESUMPTIVE POSTIVE SARS-CoV-2 nucleic acids MAY BE PRESENT.   A presumptive positive result was obtained on the submitted specimen  and confirmed on repeat testing.  While 2019 novel coronavirus  (SARS-CoV-2) nucleic acids may be present in the submitted sample  additional confirmatory testing may be necessary for epidemiological  and / or clinical management purposes  to differentiate between  SARS-CoV-2 and other Sarbecovirus currently known to infect humans.  If clinically indicated additional testing with an alternate test  methodology [phone]) is advised. The SARS-CoV-2 RNA is generally  detectable in upper and lower respiratory sp ecimens during the acute  phase of infection. The expected result is Negative. Fact Sheet for Patients:  StrictlyIdeas.no Fact Sheet for Healthcare Providers: BankingDealers.co.za This test is not yet approved or cleared by the [place] FDA and has been authorized for detection and/or diagnosis  of SARS-CoV-2 by FDA under an Emergency Use Authorization (EUA).  This EUA will remain in effect (meaning this test can be used) for the duration of the COVID-19 declaration under Section 564(b)(1) of the Act, 21 U.S.C. section 360bbb-3(b)(1), unless the authorization is terminated or revoked sooner. Performed at [facility], [place]  [address], [place], [place] [address]   Culture, blood (Routine x 2)     Status: None (Preliminary result)   Collection Time: 11/01/18  2:46 PM  Result Value Ref Range   Specimen Description      BLOOD LEFT FOREARM Performed at [place] [address], [place], [place] [address]    Special Requests      BOTTLES DRAWN AEROBIC AND ANAEROBIC Blood Culture adequate volume Performed at [place] [address] [place], [place] [address]    Culture  Setup Time      GRAM NEGATIVE RODS IN BOTH AEROBIC AND ANAEROBIC BOTTLES Organism ID to follow CRITICAL RESULT CALLED TO, READ BACK BY AND VERIFIED WITH: Shelda Jakes PHARMD, AT [place] 11/02/18 BY D. VANHOOK    Culture      NO GROWTH < 24 HOURS Performed at [facility], [address] [place], [place] [address]    Report Status PENDING   Culture, blood (Routine x 2)     Status: None (Preliminary result)   Collection Time: 11/01/18  2:46 PM  Result Value Ref Range   Specimen Description      BLOOD RIGHT ANTECUBITAL Performed at [facility], [place] [address], [place], [place] [address]    Special Requests      BOTTLES DRAWN AEROBIC AND ANAEROBIC Blood Culture adequate volume Performed at [place] [address], [place], [place] [address]    Culture  Setup Time      GRAM NEGATIVE RODS IN BOTH AEROBIC AND ANAEROBIC BOTTLES Performed at [facility], [place] [address], [place], [place] [address]    Culture GRAM NEGATIVE RODS    Report Status PENDING   Blood Culture ID Panel (Reflexed)     Status: Abnormal   Collection Time: 11/01/18  2:46 PM  Result Value Ref Range   Enterococcus species NOT DETECTED NOT DETECTED   Listeria monocytogenes NOT DETECTED NOT DETECTED   Staphylococcus species NOT DETECTED NOT DETECTED   Staphylococcus aureus (BCID) NOT DETECTED NOT DETECTED   Streptococcus species NOT DETECTED NOT DETECTED   Streptococcus agalactiae NOT  DETECTED NOT DETECTED   Streptococcus pneumoniae NOT DETECTED NOT DETECTED   Streptococcus pyogenes NOT DETECTED NOT DETECTED   Acinetobacter baumannii NOT DETECTED NOT DETECTED   Enterobacteriaceae species DETECTED (A) NOT DETECTED    Comment: Enterobacteriaceae represent a large family of gram-negative bacteria, not a single organism. CRITICAL RESULT CALLED TO, READ BACK BY AND VERIFIED WITH: Shelda Jakes PHARMD, AT 2440 11/02/18 BY D. VANHOOK    Enterobacter cloacae complex NOT DETECTED NOT DETECTED   Escherichia coli DETECTED (A) NOT DETECTED    Comment: CRITICAL RESULT CALLED TO, READ BACK BY AND VERIFIED WITH: Shelda Jakes PHARMD, AT 1027 11/02/18 BY D. VANHOOK    Klebsiella oxytoca NOT DETECTED NOT DETECTED   Klebsiella pneumoniae NOT DETECTED NOT DETECTED   Proteus species NOT DETECTED NOT DETECTED   Serratia marcescens NOT DETECTED NOT DETECTED   Carbapenem resistance NOT DETECTED NOT DETECTED   Haemophilus influenzae NOT DETECTED NOT DETECTED   Neisseria meningitidis NOT DETECTED NOT DETECTED   Pseudomonas  aeruginosa NOT DETECTED NOT DETECTED   Candida albicans NOT DETECTED NOT DETECTED   Candida glabrata NOT DETECTED NOT DETECTED   Candida krusei NOT DETECTED NOT DETECTED   Candida parapsilosis NOT DETECTED NOT DETECTED   Candida tropicalis NOT DETECTED NOT DETECTED    Comment: Performed at [facility], [place] [address] [place], [place] [address]  Troponin I - Add-On to previous collection     Status: None   Collection Time: 11/01/18  9:47 PM  Result Value Ref Range   Troponin I <0.03 <0.03 ng/mL    Comment: Performed at [facility], [place] [address] [place], [place] [address]  Magnesium     Status: None   Collection Time: 11/02/18  4:38 AM  Result Value Ref Range   Magnesium 2.1 1.7 - 2.4 mg/dL    Comment: Performed at [facility], [place] [address], [place], [place] [address]  Phosphorus     Status: Abnormal   Collection Time:  11/02/18  4:38 AM  Result Value Ref Range   Phosphorus 2.4 (L) 2.5 - 4.6 mg/dL    Comment: Performed at [facility], [place] [address], [place], [place] [address]  TSH     Status: None   Collection Time: 11/02/18  4:38 AM  Result Value Ref Range   TSH 0.532 0.350 - 4.500 uIU/mL    Comment: Performed by a 3rd Generation assay with a functional sensitivity of <=0.01 uIU/mL. Performed at [facility], [place] [address], [place], [place] [address]   Comprehensive metabolic panel     Status: Abnormal   Collection Time: 11/02/18  4:38 AM  Result Value Ref Range   Sodium 139 135 - 145 mmol/L   Potassium 3.8 3.5 - 5.1 mmol/L   Chloride 110 98 - 111 mmol/L   CO2 19 (L) 22 - 32 mmol/L   Glucose, Bld 76 70 - 99 mg/dL   BUN 9 8 - 23 mg/dL   Creatinine, Ser 0.56 0.44 - 1.00 mg/dL   Calcium 8.2 (L) 8.9 - 10.3 mg/dL   Total Protein 5.7 (L) 6.5 - 8.1 g/dL   Albumin 3.0 (L) 3.5 - 5.0 g/dL   AST 314 (H) 15 - 41 U/L   ALT 510 (H) 0 - 44 U/L   Alkaline Phosphatase 180 (H) 38 - 126 U/L   Total Bilirubin 2.5 (H) 0.3 - 1.2 mg/dL   GFR calc non Af Amer >60 >60 mL/min   GFR calc Af Amer >60 >60 mL/min   Anion gap 10 5 - 15    Comment: Performed at [facility], [place] [address] [place], [place] [address]  CBC     Status: Abnormal   Collection Time: 11/02/18  4:38 AM  Result Value Ref Range   WBC 9.0 4.0 - 10.5 K/uL   RBC 3.79 (L) 3.87 - 5.11 MIL/uL   Hemoglobin 11.0 (L) 12.0 - 15.0 g/dL   HCT 34.6 (L) 36.0 - 46.0 %   MCV 91.3 80.0 - 100.0 fL   MCH 29.0 26.0 - 34.0 pg   MCHC 31.8 30.0 - 36.0 g/dL   RDW 14.5 11.5 - 15.5 %   Platelets 174 150 - 400 K/uL   nRBC 0.0 0.0 - 0.2 %    Comment: Performed at [facility], [place] [address], [place], [place] [address]  Lactic acid, plasma     Status: None   Collection Time: 11/02/18  4:38 AM  Result  Value Ref Range   Lactic Acid, Venous 0.8 0.5 - 1.9 mmol/L    Comment: Performed at  [facility], [place] [address], [place], [place] [address]  APTT     Status: Abnormal   Collection Time: 11/02/18  4:38 AM  Result Value Ref Range   aPTT 48 (H) 24 - 36 seconds    Comment:        IF BASELINE aPTT IS ELEVATED, SUGGEST PATIENT RISK ASSESSMENT BE USED TO DETERMINE APPROPRIATE ANTICOAGULANT THERAPY. Performed at [facility], [place] [address], [place], [place] [address]   Protime-INR     Status: Abnormal   Collection Time: 11/02/18  4:38 AM  Result Value Ref Range   Prothrombin Time 32.6 (H) 11.4 - 15.2 seconds   INR 3.2 (H) 0.8 - 1.2    Comment: (NOTE) INR goal varies based on device and disease states. Performed at [facility], [place] [address], [place], [place] [address]   Lactic acid, plasma     Status: None   Collection Time: 11/02/18  8:14 AM  Result Value Ref Range   Lactic Acid, Venous 0.9 0.5 - 1.9 mmol/L    Comment: Performed at [facility], [place] [address], [place],  [address]    Dg Chest 2 View  Result Date: 11/01/2018 CLINICAL DATA:  Abdominal pain, nausea, vomiting EXAM: CHEST - 2 VIEW COMPARISON:  01/13/2017 FINDINGS: Cardiomegaly. Both lungs are clear. The visualized skeletal structures are unremarkable. IMPRESSION: Cardiomegaly without acute abnormality of the lungs. Electronically Signed   By: Eddie Candle M.D.   On: 11/01/2018 15:36   US Abdomen Complete  Result Date: 11/01/2018 CLINICAL DATA:  85 year old female with abdominal pain and fever for 1 week. EXAM: ABDOMEN ULTRASOUND COMPLETE COMPARISON:  None. FINDINGS: Gallbladder: Mobile gallstones are identified, the largest measuring 7 mm. There is no evidence of gallbladder wall thickening, pericholecystic fluid or sonographic Murphy sign. Common bile duct: Diameter: 5 mm. No intrahepatic or extrahepatic biliary dilatation. Liver: 2 homogeneously hyperechoic LEFT hepatic lesions are identified measuring 0.8 cm and 1 cm.  These most likely represent hemangiomas in the absence of primary malignancy. A LEFT hepatic cyst is present. Portal vein is patent on color Doppler imaging with normal direction of blood flow towards the liver. IVC: No abnormality visualized. Pancreas: Visualized portion unremarkable. Spleen: Size and appearance within normal limits. Right Kidney: Length: 9.3 cm. Echogenicity within normal limits. No mass or hydronephrosis visualized. Left Kidney: Length: 10.5 cm. Echogenicity within normal limits. No mass or hydronephrosis visualized. Abdominal aorta: No aneurysm visualized. Other findings: None. IMPRESSION: 1. Cholelithiasis without sonographic evidence of acute cholecystitis. No biliary dilatation. 2. Two hyperechoic LEFT hepatic lesions measuring 0.8 cm and 1 cm. These likely represent hemangiomas. If there is history of primary malignancy, consider elective MRI with and without contrast. 3. No other significant abnormalities. Electronically Signed   By: Margarette Canada M.D.   On: 11/01/2018 17:27   Mr 3d Recon At [place]  Result Date: 11/02/2018 CLINICAL DATA:  Increased abdominal pain for 1 week. EXAM: MRI ABDOMEN WITHOUT AND WITH CONTRAST (INCLUDING MRCP) TECHNIQUE: Multiplanar multisequence MR imaging of the abdomen was performed both before and after the administration of intravenous contrast. Heavily T2-weighted images of the biliary and pancreatic ducts were obtained, and three-dimensional MRCP images were rendered by post processing. CONTRAST:  7 mL Gadavist COMPARISON:  None. FINDINGS: Lower chest:  Lung bases are clear. Hepatobiliary: Several small dependent gallstones. No gallbladder wall thickening  or pericholecystic fluid. Common bile duct is normal caliber. No intra or extrahepatic duct dilatation. Several nonenhancing cysts within the liver. Focus of cortical scarring and retraction lateral RIGHT hepatic lobe probable small hemangioma at this site (image [id number]) Pancreas: Normal pancreatic  parenchymal intensity. No ductal dilatation or inflammation. Spleen: Multiple lesions within the spleen. One dominant round lesion measures 3 cm x 3.3 cm and is hyperintense to splenic tissue ( image 29/9) on T2 weighted imaging. Additional smaller lesions also hyperintense on T2 weighted imaging. Lesions enhance similar splenic tissue with slightly hypo enhancing. Lesions are indeterminate, however comparison chest CT of 11/09/2015 demonstrates no significant change Adrenals/urinary tract: Adrenal glands and kidneys are normal. Stomach/Bowel: Moderate hiatal hernia stomach and limited of the small bowel is unremarkable Vascular/Lymphatic: Abdominal aortic normal caliber. No retroperitoneal periportal lymphadenopathy. Musculoskeletal: No aggressive osseous lesion IMPRESSION: 1. No biliary duct dilatation.  No choledocholithiasis. 2. Gallstones without evidence of cholecystitis. 3. Simple hepatic cysts and probable hemangioma in the RIGHT hepatic lobe. 4. Normal pancreas. 5. Multiple splenic lesions with one dominant 3 cm lesion are indeterminate. However given no significant change from CT 11/09/2015, favor benign lesions. Electronically Signed   By: Suzy Bouchard M.D.   On: 11/02/2018 04:30   Mr Abdomen Mrcp Moise Boring Contast  Result Date: 11/02/2018 CLINICAL DATA:  Increased abdominal pain for 1 week. EXAM: MRI ABDOMEN WITHOUT AND WITH CONTRAST (INCLUDING MRCP) TECHNIQUE: Multiplanar multisequence MR imaging of the abdomen was performed both before and after the administration of intravenous contrast. Heavily T2-weighted images of the biliary and pancreatic ducts were obtained, and three-dimensional MRCP images were rendered by post processing. CONTRAST:  7 mL Gadavist COMPARISON:  None. FINDINGS: Lower chest:  Lung bases are clear. Hepatobiliary: Several small dependent gallstones. No gallbladder wall thickening or pericholecystic fluid. Common bile duct is normal caliber. No intra or extrahepatic duct  dilatation. Several nonenhancing cysts within the liver. Focus of cortical scarring and retraction lateral RIGHT hepatic lobe probable small hemangioma at this site (image 35/1202) Pancreas: Normal pancreatic parenchymal intensity. No ductal dilatation or inflammation. Spleen: Multiple lesions within the spleen. One dominant round lesion measures 3 cm x 3.3 cm and is hyperintense to splenic tissue ( image 29/9) on T2 weighted imaging. Additional smaller lesions also hyperintense on T2 weighted imaging. Lesions enhance similar splenic tissue with slightly hypo enhancing. Lesions are indeterminate, however comparison chest CT of 11/09/2015 demonstrates no significant change Adrenals/urinary tract: Adrenal glands and kidneys are normal. Stomach/Bowel: Moderate hiatal hernia stomach and limited of the small bowel is unremarkable Vascular/Lymphatic: Abdominal aortic normal caliber. No retroperitoneal periportal lymphadenopathy. Musculoskeletal: No aggressive osseous lesion IMPRESSION: 1. No biliary duct dilatation.  No choledocholithiasis. 2. Gallstones without evidence of cholecystitis. 3. Simple hepatic cysts and probable hemangioma in the RIGHT hepatic lobe. 4. Normal pancreas. 5. Multiple splenic lesions with one dominant 3 cm lesion are indeterminate. However given no significant change from CT 11/09/2015, favor benign lesions. Electronically Signed   By: Suzy Bouchard M.D.   On: 11/02/2018 04:30    Review of Systems  Constitutional: Positive for malaise/fatigue.  HENT: Negative.   Eyes: Negative.   Respiratory: Negative.   Cardiovascular: Negative.   Gastrointestinal: Positive for vomiting. Negative for abdominal pain.  Genitourinary: Negative.   Musculoskeletal: Negative.   Skin: Negative.   Neurological: Negative.   Endo/Heme/Allergies: Negative.   Psychiatric/Behavioral: Negative.    Blood pressure 107/77, pulse (!) 130, temperature (!) 100.4 F (38 C), temperature source Oral, resp. rate  19, height  5\' 2"  (1.575 m), weight 68 kg, SpO2 95 %. Physical Exam  Constitutional: She is oriented to person, place, and time. She appears well-developed and well-nourished. No distress.  HENT:  Head: Normocephalic and atraumatic.  Mouth/Throat: No oropharyngeal exudate.  Eyes: Pupils are equal, round, and reactive to light. Conjunctivae and EOM are normal.  Neck: Normal range of motion. Neck supple.  Cardiovascular:  Irregular and tachycardic  Respiratory: Effort normal and breath sounds normal. No stridor. No respiratory distress.  GI: Soft. Bowel sounds are normal. She exhibits no distension and no mass. There is no abdominal tenderness.  Musculoskeletal: Normal range of motion.        General: No tenderness or edema.  Neurological: She is alert and oriented to person, place, and time. Coordination normal.  Skin: Skin is warm and dry. No rash noted.  Psychiatric: She has a normal mood and affect. Her behavior is normal. Thought content normal.    Assessment/Plan: The patient appears to have gallstones but it is questionable how symptomatic they are.  Her elevated liver functions could be from a recently passed common duct stone that did not show up on the MRCP.  If her liver functions do not resolve over the next couple days then she may need a GI evaluation.  Otherwise if we feel that she is simply having symptomatic gallstones then she may benefit in the long run from having her gallbladder removed.  We could plan for this during this hospitalization.  She is on Coumadin and her INR is 3.2 so this will have to be normalized prior to planning any sort of surgery.  We will follow her closely with you.  Samantha Foley 11/02/2018, 10:01 AM

## 2018-11-02 NOTE — Consult Note (Signed)
[facility] Consult  Referring Provider: Virgel Manifold, MD/ER Primary Care Physician:  Levin Erp, MD Primary Gastroenterologist: Dr.Martin Wynetta Emery  Reason for Consultation: Abnormal liver enzymes, possible cholangitis  HPI: Samantha Foley is a 85 y.o. female states that she was in her usual state of health until the last 3 weeks she developed progressively worsening burping, epigastric and right upper quadrant abdominal pain, especially after meals, early satiety and a weight loss of 5 pounds.  Patient complains of heartburn and acid reflux and takes omeprazole at home twice a day.  She denies difficulty swallowing, pain on swallowing but has had nausea and vomiting a few days ago which is now resolved.  As her symptoms progressively worsened she presented to the ER where she was found to have a temperature of 100.4 F and was noted to have elevated LFTs, T bili 4.9/AST 758/ALT 778 and ALP 238 with mild leukocytosis of 13.4.  Ultrasound performed in ER showed mobile gallstones, largest 7 mm, normal CBD of 5 mm with no intra-or extrahepatic biliary dilatation.  Patient denies yellowish discoloration of skin or eyes but has noticed that her urine is unusually dark since admission. She is on Coumadin for atrial fibrillation with reported allergy to Eliquis and Xarelto. Patient has regular bowel movements and denies black stools or bloody bowel movements. Last colonoscopy was almost 10 years ago by Dr. Wynetta Emery.    Past Medical History:  Diagnosis Date  . Arthritis   . Atrial fibrillation ([place])    reported but not yet documented  . Chronic back pain    Dr. Tonita Cong  . Chronic knee pain    Dr. Rhona Raider  . Colon polyp    Dr. Wynetta Emery  . Dysrhythmia    atrial fib- mild per pt- no meds  . Food allergy    Scallops--no other seafood allergies  . GERD (gastroesophageal reflux disease)   . Hiatal hernia   . Hyperlipidemia   . Impaired glucose tolerance   . Lactose intolerance   .  Osteopenia   . PVC's (premature ventricular contractions)   . Seasonal allergies   . Vision problem    wears contacts    Past Surgical History:  Procedure Laterality Date  . BACK SURGERY     several vertbea ae rcemntekd  . ROBOTIC ASSISTED TOTAL HYSTERECTOMY WITH BILATERAL SALPINGO OOPHERECTOMY Bilateral 03/08/2016   Procedure: ROBOTIC ASSISTED TOTAL HYSTERECTOMY WITH BILATERAL SALPINGO OOPHORECTOMY/Uterosacral Ligament Suspension;  Surgeon: Princess Bruins, MD;  Location: [facility];  Service: Gynecology;  Laterality: Bilateral;  . sebaceous cyst excision  2009   neck  . TONSILLECTOMY AND ADENOIDECTOMY  age 79  . VERTEBROPLASTY  05/2009  . WISDOM TOOTH EXTRACTION      Prior to Admission medications   Medication Sig Start Date End Date Taking? Authorizing Provider  Cyanocobalamin (VITAMIN B-12 PO) Take 1 tablet by mouth daily.   Yes [provider]  fluticasone (FLONASE) 50 MCG/ACT nasal spray Place 1 spray into both nostrils daily as needed for allergies or rhinitis.   Yes [provider]  metoprolol tartrate (LOPRESSOR) 25 MG tablet TAKE 1/2 TABLET BY MOUTH 2 TIMES DAILY Patient taking differently: Take 12.5 mg by mouth 2 (two) times daily.  07/21/18  Yes Sherran Needs, NP  Multiple Vitamin (MULTIVITAMIN WITH MINERALS) TABS tablet Take 1 tablet by mouth daily.   Yes [provider]  Multiple Vitamins-Minerals (ICAPS AREDS 2 PO) Take 1 capsule by mouth 2 (two) times a day.   Yes [provider]  warfarin (COUMADIN) 1 MG tablet TAKE 1-1 1/2 TABLETS DAILY AS DIRECTED PER COUMADIN CLINIC Patient taking differently: Take 1-1.5 mg by mouth See admin instructions. 1.5 mg on Monday and fridays, all other days is 1 mg 09/01/18  Yes Deboraha Sprang, MD  diltiazem (CARDIZEM) 30 MG tablet Take 1 tablet every 4 hours AS NEEDED for AFIB  heart rate >100 0. Patient not taking: Reported on 05/29/2018 08/01/17   Sherran Needs, NP  oxyCODONE-acetaminophen  (PERCOCET/ROXICET) 5-325 MG tablet Take 1 tablet by mouth every 6 (six) hours as needed for severe pain. Patient not taking: Reported on 11/01/2018 04/21/17   Langston Masker B, PA-C    Current Facility-Administered Medications  Medication Dose Route Frequency Provider Last Rate Last Dose  . acetaminophen (TYLENOL) tablet 650 mg  650 mg Oral Q6H PRN Toy Baker, MD       Or  . acetaminophen (TYLENOL) suppository 650 mg  650 mg Rectal Q6H PRN Doutova, Anastassia, MD      . fentaNYL (SUBLIMAZE) injection 25 mcg  25 mcg Intravenous Q2H PRN Doutova, Anastassia, MD      . HYDROcodone-acetaminophen (NORCO/VICODIN) 5-325 MG per tablet 1-2 tablet  1-2 tablet Oral Q4H PRN Doutova, Anastassia, MD      . metoprolol tartrate (LOPRESSOR) injection 2.5 mg  2.5 mg Intravenous Q6H Samtani, Jai-Gurmukh, MD      . metoprolol tartrate (LOPRESSOR) tablet 12.5 mg  12.5 mg Oral BID Doutova, Anastassia, MD   12.5 mg at 11/02/18 0830  . ondansetron (ZOFRAN) tablet 4 mg  4 mg Oral Q6H PRN Doutova, Anastassia, MD       Or  . ondansetron (ZOFRAN) injection 4 mg  4 mg Intravenous Q6H PRN Doutova, Anastassia, MD      . piperacillin-tazobactam (ZOSYN) IVPB 3.375 g  3.375 g Intravenous Q8H Doutova, Anastassia, MD 12.5 mL/hr at 11/02/18 0632 3.375 g at 11/02/18 2025    Allergies as of 11/01/2018 - Review Complete 11/01/2018  Allergen Reaction Noted  . Ciprofloxacin Shortness Of Breath and Rash 07/02/2011  . Scallops [shellfish allergy] Anaphylaxis 07/26/2011  . Apixaban Other (See Comments) 06/24/2015  . Cherry Other (See Comments) 11/01/2018  . Demerol Other (See Comments) 07/02/2011  . Doxycycline Nausea And Vomiting 01/22/2017  . Milk-related compounds Diarrhea 07/26/2011  . Penicillins Other (See Comments) 07/02/2011  . Rivaroxaban Other (See Comments) 06/24/2015  . Sudafed [pseudoephedrine hcl] Other (See Comments) 07/02/2011    Family History  Problem Relation Age of Onset  . Rheumatic fever Mother    . Diabetes Mother   . Heart disease Mother        rheumatic heart disease  . Cancer Sister 45       breast cancer  . Cancer Brother        bladder and prostate cancer  . Migraines Daughter   . Cancer Sister        leukemia at 63; colon cancer in her late 30's--family ?'s dx    Social History   Socioeconomic History  . Marital status: Married    Spouse name: Not on file  . Number of children: 2  . Years of education: Not on file  . Highest education level: Not on file  Occupational History  . Occupation: retired (ran [employer] with her husband)  Social Needs  . Financial resource strain: Not on file  . Food insecurity:    Worry: Not on file    Inability: Not on file  . Transportation needs:  Medical: Not on file    Non-medical: Not on file  Tobacco Use  . Smoking status: Never Smoker  . Smokeless tobacco: Never Used  Substance and Sexual Activity  . Alcohol use: No    Comment: maybe one glass per year.  . Drug use: No  . Sexual activity: Not Currently  Lifestyle  . Physical activity:    Days per week: Not on file    Minutes per session: Not on file  . Stress: Not on file  Relationships  . Social connections:    Talks on phone: Not on file    Gets together: Not on file    Attends religious service: Not on file    Active member of club or organization: Not on file    Attends meetings of clubs or organizations: Not on file    Relationship status: Not on file  . Intimate partner violence:    Fear of current or ex partner: Not on file    Emotionally abused: Not on file    Physically abused: Not on file    Forced sexual activity: Not on file  Other Topics Concern  . Not on file  Social History Narrative   Lives with her husband.  No pets.  Daughter in [place], [place]; Daughter in [place]. Cares for her 2 grandsons    Review of Systems: Positive for: GI: Described in detail in HPI.    Gen: anorexia, fatigue, weakness,involuntary weight loss, Denies any fever,  chills, rigors, night sweats,  malaise, and sleep disorder CV: Denies chest pain, angina, palpitations, syncope, orthopnea, PND, peripheral edema, and claudication. Resp: Denies dyspnea, cough, sputum, wheezing, coughing up blood. GU : Denies urinary burning, blood in urine, urinary frequency, urinary hesitancy, nocturnal urination, and urinary incontinence. MS: Denies joint pain or swelling.  Denies muscle weakness, cramps, atrophy.  Derm: Denies rash, itching, oral ulcerations, hives, unhealing ulcers.  Psych: Denies depression, anxiety, memory loss, suicidal ideation, hallucinations,  and confusion. Heme: Denies bruising, bleeding, and enlarged lymph nodes. Neuro:  Denies any headaches, dizziness, paresthesias. Endo:  Denies any problems with DM, thyroid, adrenal function.  Physical Exam: Vital signs in last 24 hours: Temp:  [98.6 F (37 C)-100.4 F (38 C)] 100.4 F (38 C) (05/10 0428) Pulse Rate:  [68-147] 147 (05/10 0830) Resp:  [14-27] 19 (05/10 0428) BP: (88-144)/(53-92) 120/70 (05/10 0830) SpO2:  [94 %-100 %] 95 % (05/10 0428) Weight:  [68 kg-71.9 kg] 68 kg (05/09 2237) Last BM Date: 11/01/18  General:   Alert,  Well-developed, well-nourished, pleasant and cooperative in NAD, appears younger than stated age Head:  Normocephalic and atraumatic. Eyes:  Sclera clear, icterus.   Conjunctiva pink. Ears:  Normal auditory acuity. Nose:  No deformity, discharge,  or lesions. Mouth:  No deformity or lesions.  Oropharynx pink & moist. Neck:  Supple; no masses or thyromegaly. Lungs:  Clear throughout to auscultation.   No wheezes, crackles, or rhonchi. No acute distress. Heart:  irregular rate and rhythm; no murmurs, clicks, rubs,  or gallops. Extremities:  Without clubbing or edema. Neurologic:  Alert and  oriented x4;  grossly normal neurologically. Skin:  Intact without significant lesions or rashes. Psych:  Alert and cooperative. Normal mood and affect. Abdomen:  Soft, nontender  and nondistended. No masses, hepatosplenomegaly or hernias noted. Normal bowel sounds, without guarding, and without rebound.         Lab Results: Recent Labs    11/01/18 1428 11/02/18 0438  WBC 13.4* 9.0  HGB 12.6 11.0*  HCT 38.1 34.6*  PLT 183 174   BMET Recent Labs    11/01/18 1428 11/02/18 0438  NA 136 139  K 3.9 3.8  CL 105 110  CO2 22 19*  GLUCOSE 119* 76  BUN 9 9  CREATININE 0.62 0.56  CALCIUM 8.4* 8.2*   LFT Recent Labs    11/02/18 0438  PROT 5.7*  ALBUMIN 3.0*  AST 314*  ALT 510*  ALKPHOS 180*  BILITOT 2.5*   PT/INR Recent Labs    11/01/18 1428 11/02/18 0438  LABPROT 29.7* 32.6*  INR 2.9* 3.2*    Studies/Results: Dg Chest 2 View  Result Date: 11/01/2018 CLINICAL DATA:  Abdominal pain, nausea, vomiting EXAM: CHEST - 2 VIEW COMPARISON:  01/13/2017 FINDINGS: Cardiomegaly. Both lungs are clear. The visualized skeletal structures are unremarkable. IMPRESSION: Cardiomegaly without acute abnormality of the lungs. Electronically Signed   By: Eddie Candle M.D.   On: 11/01/2018 15:36   US Abdomen Complete  Result Date: 11/01/2018 CLINICAL DATA:  85 year old female with abdominal pain and fever for 1 week. EXAM: ABDOMEN ULTRASOUND COMPLETE COMPARISON:  None. FINDINGS: Gallbladder: Mobile gallstones are identified, the largest measuring 7 mm. There is no evidence of gallbladder wall thickening, pericholecystic fluid or sonographic Murphy sign. Common bile duct: Diameter: 5 mm. No intrahepatic or extrahepatic biliary dilatation. Liver: 2 homogeneously hyperechoic LEFT hepatic lesions are identified measuring 0.8 cm and 1 cm. These most likely represent hemangiomas in the absence of primary malignancy. A LEFT hepatic cyst is present. Portal vein is patent on color Doppler imaging with normal direction of blood flow towards the liver. IVC: No abnormality visualized. Pancreas: Visualized portion unremarkable. Spleen: Size and appearance within normal limits. Right  Kidney: Length: 9.3 cm. Echogenicity within normal limits. No mass or hydronephrosis visualized. Left Kidney: Length: 10.5 cm. Echogenicity within normal limits. No mass or hydronephrosis visualized. Abdominal aorta: No aneurysm visualized. Other findings: None. IMPRESSION: 1. Cholelithiasis without sonographic evidence of acute cholecystitis. No biliary dilatation. 2. Two hyperechoic LEFT hepatic lesions measuring 0.8 cm and 1 cm. These likely represent hemangiomas. If there is history of primary malignancy, consider elective MRI with and without contrast. 3. No other significant abnormalities. Electronically Signed   By: Margarette Canada M.D.   On: 11/01/2018 17:27   Mr 3d Recon At Scanner  Result Date: 11/02/2018 CLINICAL DATA:  Increased abdominal pain for 1 week. EXAM: MRI ABDOMEN WITHOUT AND WITH CONTRAST (INCLUDING MRCP) TECHNIQUE: Multiplanar multisequence MR imaging of the abdomen was performed both before and after the administration of intravenous contrast. Heavily T2-weighted images of the biliary and pancreatic ducts were obtained, and three-dimensional MRCP images were rendered by post processing. CONTRAST:  7 mL Gadavist COMPARISON:  None. FINDINGS: Lower chest:  Lung bases are clear. Hepatobiliary: Several small dependent gallstones. No gallbladder wall thickening or pericholecystic fluid. Common bile duct is normal caliber. No intra or extrahepatic duct dilatation. Several nonenhancing cysts within the liver. Focus of cortical scarring and retraction lateral RIGHT hepatic lobe probable small hemangioma at this site (image [id number]) Pancreas: Normal pancreatic parenchymal intensity. No ductal dilatation or inflammation. Spleen: Multiple lesions within the spleen. One dominant round lesion measures 3 cm x 3.3 cm and is hyperintense to splenic tissue ( image 29/9) on T2 weighted imaging. Additional smaller lesions also hyperintense on T2 weighted imaging. Lesions enhance similar splenic tissue with  slightly hypo enhancing. Lesions are indeterminate, however comparison chest CT of 11/09/2015 demonstrates no significant change Adrenals/urinary tract: Adrenal glands and kidneys are normal. Stomach/Bowel:  Moderate hiatal hernia stomach and limited of the small bowel is unremarkable Vascular/Lymphatic: Abdominal aortic normal caliber. No retroperitoneal periportal lymphadenopathy. Musculoskeletal: No aggressive osseous lesion IMPRESSION: 1. No biliary duct dilatation.  No choledocholithiasis. 2. Gallstones without evidence of cholecystitis. 3. Simple hepatic cysts and probable hemangioma in the RIGHT hepatic lobe. 4. Normal pancreas. 5. Multiple splenic lesions with one dominant 3 cm lesion are indeterminate. However given no significant change from CT 11/09/2015, favor benign lesions. Electronically Signed   By: Suzy Bouchard M.D.   On: 11/02/2018 04:30   Mr Abdomen Mrcp Moise Boring Contast  Result Date: 11/02/2018 CLINICAL DATA:  Increased abdominal pain for 1 week. EXAM: MRI ABDOMEN WITHOUT AND WITH CONTRAST (INCLUDING MRCP) TECHNIQUE: Multiplanar multisequence MR imaging of the abdomen was performed both before and after the administration of intravenous contrast. Heavily T2-weighted images of the biliary and pancreatic ducts were obtained, and three-dimensional MRCP images were rendered by post processing. CONTRAST:  7 mL Gadavist COMPARISON:  None. FINDINGS: Lower chest:  Lung bases are clear. Hepatobiliary: Several small dependent gallstones. No gallbladder wall thickening or pericholecystic fluid. Common bile duct is normal caliber. No intra or extrahepatic duct dilatation. Several nonenhancing cysts within the liver. Focus of cortical scarring and retraction lateral RIGHT hepatic lobe probable small hemangioma at this site (image [id number]) Pancreas: Normal pancreatic parenchymal intensity. No ductal dilatation or inflammation. Spleen: Multiple lesions within the spleen. One dominant round lesion measures 3  cm x 3.3 cm and is hyperintense to splenic tissue ( image 29/9) on T2 weighted imaging. Additional smaller lesions also hyperintense on T2 weighted imaging. Lesions enhance similar splenic tissue with slightly hypo enhancing. Lesions are indeterminate, however comparison chest CT of 11/09/2015 demonstrates no significant change Adrenals/urinary tract: Adrenal glands and kidneys are normal. Stomach/Bowel: Moderate hiatal hernia stomach and limited of the small bowel is unremarkable Vascular/Lymphatic: Abdominal aortic normal caliber. No retroperitoneal periportal lymphadenopathy. Musculoskeletal: No aggressive osseous lesion IMPRESSION: 1. No biliary duct dilatation.  No choledocholithiasis. 2. Gallstones without evidence of cholecystitis. 3. Simple hepatic cysts and probable hemangioma in the RIGHT hepatic lobe. 4. Normal pancreas. 5. Multiple splenic lesions with one dominant 3 cm lesion are indeterminate. However given no significant change from CT 11/09/2015, favor benign lesions. Electronically Signed   By: Suzy Bouchard M.D.   On: 11/02/2018 04:30    Impression: Symptomatic cholelithiasis with abnormal liver enzymes, mild leukocytosis on presentation with a temperature of 100.28F, normal lipase Ultrasound on admission did not show any intra-or extrahepatic biliary dilatation and CBD was 5 mm MRI MRCP was requested subsequently which showed no choledocholithiasis, no biliary ductal dilatation, gallstones without evidence of cholecystitis, simple hepatic cyst and probable hemangioma in right lobe of the liver, multiple splenic lesions with one dominant 3 cm lesion which is indeterminant, favor benign etiology Liver enzymes have improved to bili 2.5/AST 314/ALT 510/ALP 180, possibility of a passed common bile duct stone  Atrial fibrillation, was on Coumadin, INR 2.9 on admission and 3.2 today  Plan: Surgical evaluation for cholecystectomy, recommend intraoperative cholangiogram, reconsult GI if  cholangiogram is abnormal  Patient may benefit from switching to heparin for atrial fibrillation which can be stopped for 6 hours prior to procedure, discussed the same with patient's hospitalist Dr. Verlon Au Discussed about labs, imaging and clinical course with patient in details along with her daughter Bethann Punches over the phone. No evidence of cholangitis, patient remains pain-free, there is no leukocytosis, improvement in liver enzyme. Patient is started on clear liquid diet and  continued on IV fluids and IV Zosyn.   LOS: 1 day   Ronnette Juniper, MD  11/02/2018, 9:26 AM  Pager [phone] If no answer or after 5 PM call [phone]

## 2018-11-02 NOTE — Progress Notes (Addendum)
Patient with history of atrial fib, notified by CCMD heart rate in the 140's. Patient states she can feel her heart racing.   Scheduled po Lopressor given, BP 120/70.  MD notified.    Patient states she has frequent episodes like this at home, her heart will starting racing, she will check the rate on her phone and it will be "all over the place" and then it just stops.

## 2018-11-02 NOTE — Progress Notes (Signed)
Spoke with surgeon on call regarding patients worsening abdominal pain with clear liquids as well as fever (primary MD addressing fever).  Patient continues to refuse any pain medication.

## 2018-11-02 NOTE — Progress Notes (Signed)
Pharmacy Antibiotic Note  Samantha Foley is a 85 y.o. female admitted on 11/01/2018 with Intra-abdominal infection.  Pharmacy has been consulted for zosyn dosing.  Plan: Zosyn 3.375g IV q8h (4 hour infusion).  Height: 5\' 2"  (157.5 cm) Weight: 149 lb 14.6 oz (68 kg) IBW/kg (Calculated) : 50.1  Temp (24hrs), Avg:99.5 F (37.5 C), Min:98.6 F (37 C), Max:100.4 F (38 C)  Recent Labs  Lab 11/01/18 1428 11/02/18 0438  WBC 13.4* 9.0  CREATININE 0.62 0.56  LATICACIDVEN 1.9 0.8    Estimated Creatinine Clearance: 45.7 mL/min (by C-G formula based on SCr of 0.56 mg/dL).    Allergies  Allergen Reactions  . Ciprofloxacin Shortness Of Breath and Rash  . Scallops [Shellfish Allergy] Anaphylaxis  . Apixaban Other (See Comments)    fatigue  . Cherry Other (See Comments)    Vertigo  . Demerol Other (See Comments)    Patient states it made her crazy and sleep for 12 hours  . Doxycycline Nausea And Vomiting  . Milk-Related Compounds Diarrhea  . Penicillins Other (See Comments)    Did it involve swelling of the face/tongue/throat, SOB, or low BP? Unknown Did it involve sudden or severe rash/hives, skin peeling, or any reaction on the inside of your mouth or nose? Unknown Did you need to seek medical attention at a hospital or doctor's office? Unknown When did it last happen? If all above answers are "NO", may proceed with cephalosporin use.   . Rivaroxaban Other (See Comments)    Pt unsure but d/c use  . Sudafed [Pseudoephedrine Hcl] Other (See Comments)    hyperactive    Antimicrobials this admission: Zosyn 11/02/2018 >>   Dose adjustments this admission: -  Microbiology results: -  Thank you for allowing pharmacy to be a part of this patient's care.  Nani Skillern Crowford 11/02/2018 6:31 AM

## 2018-11-02 NOTE — Progress Notes (Signed)
PHARMACY - PHYSICIAN COMMUNICATION CRITICAL VALUE ALERT - BLOOD CULTURE IDENTIFICATION (BCID)  Samantha Foley is an 85 y.o. female who presented to [facility] on 11/01/2018 with a chief complaint of abdominal pain and fever.  Assessment:   BCx (5/9): BCID + 4/4 GNR, E.coli, no KPC detected  Name of physician (or Provider) Contacted: Dr. Verlon Au  Current antibiotics: Piperacillin/tazobactam 3.375 g IV q8h  Of note, pt has PCN allergy listed on chart (unknown reaction) - MD aware. Patient has received 3 doses thus far during admission. Confirmed with RN that pt is not having any signs/symptoms of allergic reaction.   Changes to prescribed antibiotics recommended: Per discussion with MD, no changes to antibiotics at this time.  Antibiogram shows good coverage of E.coli with piperacillin/tazobactam. Recommended de-escalation to ceftriaxone 2 g IV daily per protocol. +/- metronidazole if anaerobic coverage needed.   Results for orders placed or performed during the hospital encounter of 11/01/18  Blood Culture ID Panel (Reflexed) (Collected: 11/01/2018  2:46 PM)  Result Value Ref Range   Enterococcus species NOT DETECTED NOT DETECTED   Listeria monocytogenes NOT DETECTED NOT DETECTED   Staphylococcus species NOT DETECTED NOT DETECTED   Staphylococcus aureus (BCID) NOT DETECTED NOT DETECTED   Streptococcus species NOT DETECTED NOT DETECTED   Streptococcus agalactiae NOT DETECTED NOT DETECTED   Streptococcus pneumoniae NOT DETECTED NOT DETECTED   Streptococcus pyogenes NOT DETECTED NOT DETECTED   Acinetobacter baumannii NOT DETECTED NOT DETECTED   Enterobacteriaceae species DETECTED (A) NOT DETECTED   Enterobacter cloacae complex NOT DETECTED NOT DETECTED   Escherichia coli DETECTED (A) NOT DETECTED   Klebsiella oxytoca NOT DETECTED NOT DETECTED   Klebsiella pneumoniae NOT DETECTED NOT DETECTED   Proteus species NOT DETECTED NOT DETECTED   Serratia marcescens NOT DETECTED NOT DETECTED   Carbapenem resistance NOT DETECTED NOT DETECTED   Haemophilus influenzae NOT DETECTED NOT DETECTED   Neisseria meningitidis NOT DETECTED NOT DETECTED   Pseudomonas aeruginosa NOT DETECTED NOT DETECTED   Candida albicans NOT DETECTED NOT DETECTED   Candida glabrata NOT DETECTED NOT DETECTED   Candida krusei NOT DETECTED NOT DETECTED   Candida parapsilosis NOT DETECTED NOT DETECTED   Candida tropicalis NOT DETECTED NOT DETECTED    Lenis Noon, PharmD 11/02/2018  7:56 AM

## 2018-11-02 NOTE — Progress Notes (Signed)
Patient states Maalox did nothing, RN noted patient to be shaking and having chills.  Notified MD, new orders received.  Patient continues to defer any kind of pain medication.  K pad applied to right upper quadrant/r upper back.

## 2018-11-02 NOTE — Progress Notes (Addendum)
Still in a fib ~ 140-150 driven by Abd/back pain +Tmax 102.4  BP 123/88 (BP Location: Left Arm)   Pulse (!) 118   Temp (!) 102.4 F (39.1 C) (Rectal)   Resp 20   Ht 5\' 2"  (1.575 m)   Wt 68 kg   SpO2 96%   BMI 27.42 kg/m  She is stable overall but will need  1 Cardizem Gtt  2 continued septic work-up-cycle lactic x 2 + CBC + BC x1 more 3 tx to [place] [daugther ] on phone and left VM  Verneita Griffes, MD [facility] 2:00 PM   Addend 6:33 PM   Patient reviewed-pain still there with soup/other clears and belching ++ Described as epig-boring through Get lipase in am, only water as liquid Re-assess in am  Verneita Griffes, MD [facility] 6:32 PM

## 2018-11-02 NOTE — Progress Notes (Signed)
Patient c/o pain in the right upper abdomen radiating around to back after eating a popsicle and drinking some clear liquids.  Patient says this is what has been happening at home and she can not live like this.  Patient with lots of gas and belching as well.  Patient refuses pain medication (IV or po) but did agree to take Maalox.  Maalox given for indigestion.  Will continue to monitor.

## 2018-11-02 NOTE — Progress Notes (Signed)
2.5 mg IV Lopressor given as per MD order, HR down to 107 within 10 minutes.  Patient states she feels better.  Will continue to monitor.

## 2018-11-03 ENCOUNTER — Inpatient Hospital Stay (HOSPITAL_COMMUNITY): Payer: PPO

## 2018-11-03 DIAGNOSIS — I480 Paroxysmal atrial fibrillation: Secondary | ICD-10-CM

## 2018-11-03 DIAGNOSIS — I340 Nonrheumatic mitral (valve) insufficiency: Secondary | ICD-10-CM

## 2018-11-03 DIAGNOSIS — I361 Nonrheumatic tricuspid (valve) insufficiency: Secondary | ICD-10-CM

## 2018-11-03 DIAGNOSIS — Z01810 Encounter for preprocedural cardiovascular examination: Secondary | ICD-10-CM

## 2018-11-03 LAB — PROTIME-INR
INR: 3.7 — ABNORMAL HIGH (ref 0.8–1.2)
Prothrombin Time: 36.4 seconds — ABNORMAL HIGH (ref 11.4–15.2)

## 2018-11-03 LAB — COMPREHENSIVE METABOLIC PANEL
ALT: 265 U/L — ABNORMAL HIGH (ref 0–44)
AST: 73 U/L — ABNORMAL HIGH (ref 15–41)
Albumin: 2.7 g/dL — ABNORMAL LOW (ref 3.5–5.0)
Alkaline Phosphatase: 142 U/L — ABNORMAL HIGH (ref 38–126)
Anion gap: 8 (ref 5–15)
BUN: 9 mg/dL (ref 8–23)
CO2: 20 mmol/L — ABNORMAL LOW (ref 22–32)
Calcium: 7.9 mg/dL — ABNORMAL LOW (ref 8.9–10.3)
Chloride: 112 mmol/L — ABNORMAL HIGH (ref 98–111)
Creatinine, Ser: 0.7 mg/dL (ref 0.44–1.00)
GFR calc Af Amer: >60 mL/min (ref >60)
GFR calc non Af Amer: >60 mL/min (ref >60)
Glucose, Bld: 81 mg/dL (ref 70–99)
Potassium: 3.3 mmol/L — ABNORMAL LOW (ref 3.5–5.1)
Sodium: 140 mmol/L (ref 135–145)
Total Bilirubin: 1.4 mg/dL — ABNORMAL HIGH (ref 0.3–1.2)
Total Protein: 5.6 g/dL — ABNORMAL LOW (ref 6.5–8.1)

## 2018-11-03 LAB — CBC WITH DIFFERENTIAL/PLATELET
Abs Immature Granulocytes: 0.02 10*3/uL (ref 0.00–0.07)
Basophils Absolute: 0 10*3/uL (ref 0.0–0.1)
Basophils Relative: 0 %
Eosinophils Absolute: 0 10*3/uL (ref 0.0–0.5)
Eosinophils Relative: 1 %
HCT: 31.6 % — ABNORMAL LOW (ref 36.0–46.0)
Hemoglobin: 10.1 g/dL — ABNORMAL LOW (ref 12.0–15.0)
Immature Granulocytes: 0 %
Lymphocytes Relative: 8 %
Lymphs Abs: 0.4 10*3/uL — ABNORMAL LOW (ref 0.7–4.0)
MCH: 29.7 pg (ref 26.0–34.0)
MCHC: 32 g/dL (ref 30.0–36.0)
MCV: 92.9 fL (ref 80.0–100.0)
Monocytes Absolute: 0.4 10*3/uL (ref 0.1–1.0)
Monocytes Relative: 8 %
Neutro Abs: 4.4 10*3/uL (ref 1.7–7.7)
Neutrophils Relative %: 83 %
Platelets: 122 10*3/uL — ABNORMAL LOW (ref 150–400)
RBC: 3.4 MIL/uL — ABNORMAL LOW (ref 3.87–5.11)
RDW: 14.9 % (ref 11.5–15.5)
WBC: 5.4 10*3/uL (ref 4.0–10.5)
nRBC: 0 % (ref 0.0–0.2)

## 2018-11-03 LAB — ECHOCARDIOGRAM COMPLETE
Height: 62 in
Weight: 2479.73 oz

## 2018-11-03 LAB — LIPASE, BLOOD: Lipase: 35 U/L (ref 11–51)

## 2018-11-03 MED ORDER — AMIODARONE LOAD VIA INFUSION
150.0000 mg | Freq: Once | INTRAVENOUS | Status: AC
  Administered 2018-11-03: 150 mg via INTRAVENOUS
  Filled 2018-11-03: qty 83.34

## 2018-11-03 MED ORDER — POTASSIUM CHLORIDE IN NACL 40-0.9 MEQ/L-% IV SOLN
INTRAVENOUS | Status: DC
  Administered 2018-11-03: 75 mL/h via INTRAVENOUS
  Filled 2018-11-03: qty 1000

## 2018-11-03 MED ORDER — KCL IN DEXTROSE-NACL 40-5-0.9 MEQ/L-%-% IV SOLN
INTRAVENOUS | Status: DC
  Administered 2018-11-03 – 2018-11-05 (×4): via INTRAVENOUS
  Filled 2018-11-03 (×7): qty 1000

## 2018-11-03 MED ORDER — AMIODARONE HCL IN DEXTROSE 360-4.14 MG/200ML-% IV SOLN
60.0000 mg/h | INTRAVENOUS | Status: DC
  Administered 2018-11-03: 60 mg/h via INTRAVENOUS

## 2018-11-03 MED ORDER — POTASSIUM CHLORIDE 2 MEQ/ML IV SOLN: INTRAVENOUS | Status: DC

## 2018-11-03 MED ORDER — AMIODARONE HCL IN DEXTROSE 360-4.14 MG/200ML-% IV SOLN
30.0000 mg/h | INTRAVENOUS | Status: DC
  Administered 2018-11-03 – 2018-11-05 (×4): 30 mg/h via INTRAVENOUS
  Filled 2018-11-03 (×3): qty 200

## 2018-11-03 NOTE — Progress Notes (Signed)
Subjective/Chief Complaint: Denies any pain now. Has been having post-prandial "gas pain", belching, etc for quite some time.    Objective: Vital signs in last 24 hours: Temp:  [97.9 F (36.6 C)-102.4 F (39.1 C)] 98.3 F (36.8 C) (05/11 0800) Pulse Rate:  [68-143] 69 (05/11 0600) Resp:  [18-31] 26 (05/11 0600) BP: (85-131)/(41-88) 119/53 (05/11 0600) SpO2:  [92 %-100 %] 97 % (05/11 0600) Weight:  [70.3 kg-71.8 kg] 70.3 kg (05/11 0536) Last BM Date: 11/01/18  Intake/Output from previous day: 05/10 0701 - 05/11 0700 In: 1448.7 [I.V.:1259.9; IV Piggyback:188.8] Out: 900 [Urine:900] Intake/Output this shift: No intake/output data recorded.  General appearance: alert and cooperative Resp: clear to auscultation bilaterally Cardio: regular rate and rhythm GI: soft, non-tender; bowel sounds normal; no masses,  no organomegaly Skin: Skin color, texture, turgor normal. No rashes or lesions Neurologic: Grossly normal  Lab Results:  Recent Labs    11/02/18 1343 11/03/18 0623  WBC 8.1 5.4  HGB 11.4* 10.1*  HCT 35.2* 31.6*  PLT 153 122*   BMET Recent Labs    11/02/18 0438 11/03/18 0623  NA 139 140  K 3.8 3.3*  CL 110 112*  CO2 19* 20*  GLUCOSE 76 81  BUN 9 9  CREATININE 0.56 0.70  CALCIUM 8.2* 7.9*   PT/INR Recent Labs    11/02/18 0438 11/03/18 0623  LABPROT 32.6* 36.4*  INR 3.2* 3.7*   ABG No results for input(s): PHART, HCO3 in the last 72 hours.  Invalid input(s): PCO2, PO2  Studies/Results: Dg Chest 2 View  Result Date: 11/01/2018 CLINICAL DATA:  Abdominal pain, nausea, vomiting EXAM: CHEST - 2 VIEW COMPARISON:  01/13/2017 FINDINGS: Cardiomegaly. Both lungs are clear. The visualized skeletal structures are unremarkable. IMPRESSION: Cardiomegaly without acute abnormality of the lungs. Electronically Signed   By: Eddie Candle M.D.   On: 11/01/2018 15:36   US Abdomen Complete  Result Date: 11/01/2018 CLINICAL DATA:  85 year old female with abdominal  pain and fever for 1 week. EXAM: ABDOMEN ULTRASOUND COMPLETE COMPARISON:  None. FINDINGS: Gallbladder: Mobile gallstones are identified, the largest measuring 7 mm. There is no evidence of gallbladder wall thickening, pericholecystic fluid or sonographic Murphy sign. Common bile duct: Diameter: 5 mm. No intrahepatic or extrahepatic biliary dilatation. Liver: 2 homogeneously hyperechoic LEFT hepatic lesions are identified measuring 0.8 cm and 1 cm. These most likely represent hemangiomas in the absence of primary malignancy. A LEFT hepatic cyst is present. Portal vein is patent on color Doppler imaging with normal direction of blood flow towards the liver. IVC: No abnormality visualized. Pancreas: Visualized portion unremarkable. Spleen: Size and appearance within normal limits. Right Kidney: Length: 9.3 cm. Echogenicity within normal limits. No mass or hydronephrosis visualized. Left Kidney: Length: 10.5 cm. Echogenicity within normal limits. No mass or hydronephrosis visualized. Abdominal aorta: No aneurysm visualized. Other findings: None. IMPRESSION: 1. Cholelithiasis without sonographic evidence of acute cholecystitis. No biliary dilatation. 2. Two hyperechoic LEFT hepatic lesions measuring 0.8 cm and 1 cm. These likely represent hemangiomas. If there is history of primary malignancy, consider elective MRI with and without contrast. 3. No other significant abnormalities. Electronically Signed   By: Margarette Canada M.D.   On: 11/01/2018 17:27   Mr 3d Recon At Scanner  Result Date: 11/02/2018 CLINICAL DATA:  Increased abdominal pain for 1 week. EXAM: MRI ABDOMEN WITHOUT AND WITH CONTRAST (INCLUDING MRCP) TECHNIQUE: Multiplanar multisequence MR imaging of the abdomen was performed both before and after the administration of intravenous contrast. Heavily T2-weighted images of  the biliary and pancreatic ducts were obtained, and three-dimensional MRCP images were rendered by post processing. CONTRAST:  7 mL Gadavist  COMPARISON:  None. FINDINGS: Lower chest:  Lung bases are clear. Hepatobiliary: Several small dependent gallstones. No gallbladder wall thickening or pericholecystic fluid. Common bile duct is normal caliber. No intra or extrahepatic duct dilatation. Several nonenhancing cysts within the liver. Focus of cortical scarring and retraction lateral RIGHT hepatic lobe probable small hemangioma at this site (image 35/1202) Pancreas: Normal pancreatic parenchymal intensity. No ductal dilatation or inflammation. Spleen: Multiple lesions within the spleen. One dominant round lesion measures 3 cm x 3.3 cm and is hyperintense to splenic tissue ( image 29/9) on T2 weighted imaging. Additional smaller lesions also hyperintense on T2 weighted imaging. Lesions enhance similar splenic tissue with slightly hypo enhancing. Lesions are indeterminate, however comparison chest CT of 11/09/2015 demonstrates no significant change Adrenals/urinary tract: Adrenal glands and kidneys are normal. Stomach/Bowel: Moderate hiatal hernia stomach and limited of the small bowel is unremarkable Vascular/Lymphatic: Abdominal aortic normal caliber. No retroperitoneal periportal lymphadenopathy. Musculoskeletal: No aggressive osseous lesion IMPRESSION: 1. No biliary duct dilatation.  No choledocholithiasis. 2. Gallstones without evidence of cholecystitis. 3. Simple hepatic cysts and probable hemangioma in the RIGHT hepatic lobe. 4. Normal pancreas. 5. Multiple splenic lesions with one dominant 3 cm lesion are indeterminate. However given no significant change from CT 11/09/2015, favor benign lesions. Electronically Signed   By: Suzy Bouchard M.D.   On: 11/02/2018 04:30   Mr Abdomen Mrcp Moise Boring Contast  Result Date: 11/02/2018 CLINICAL DATA:  Increased abdominal pain for 1 week. EXAM: MRI ABDOMEN WITHOUT AND WITH CONTRAST (INCLUDING MRCP) TECHNIQUE: Multiplanar multisequence MR imaging of the abdomen was performed both before and after the  administration of intravenous contrast. Heavily T2-weighted images of the biliary and pancreatic ducts were obtained, and three-dimensional MRCP images were rendered by post processing. CONTRAST:  7 mL Gadavist COMPARISON:  None. FINDINGS: Lower chest:  Lung bases are clear. Hepatobiliary: Several small dependent gallstones. No gallbladder wall thickening or pericholecystic fluid. Common bile duct is normal caliber. No intra or extrahepatic duct dilatation. Several nonenhancing cysts within the liver. Focus of cortical scarring and retraction lateral RIGHT hepatic lobe probable small hemangioma at this site (image 35/1202) Pancreas: Normal pancreatic parenchymal intensity. No ductal dilatation or inflammation. Spleen: Multiple lesions within the spleen. One dominant round lesion measures 3 cm x 3.3 cm and is hyperintense to splenic tissue ( image 29/9) on T2 weighted imaging. Additional smaller lesions also hyperintense on T2 weighted imaging. Lesions enhance similar splenic tissue with slightly hypo enhancing. Lesions are indeterminate, however comparison chest CT of 11/09/2015 demonstrates no significant change Adrenals/urinary tract: Adrenal glands and kidneys are normal. Stomach/Bowel: Moderate hiatal hernia stomach and limited of the small bowel is unremarkable Vascular/Lymphatic: Abdominal aortic normal caliber. No retroperitoneal periportal lymphadenopathy. Musculoskeletal: No aggressive osseous lesion IMPRESSION: 1. No biliary duct dilatation.  No choledocholithiasis. 2. Gallstones without evidence of cholecystitis. 3. Simple hepatic cysts and probable hemangioma in the RIGHT hepatic lobe. 4. Normal pancreas. 5. Multiple splenic lesions with one dominant 3 cm lesion are indeterminate. However given no significant change from CT 11/09/2015, favor benign lesions. Electronically Signed   By: Suzy Bouchard M.D.   On: 11/02/2018 04:30    Anti-infectives: Anti-infectives (From admission, onward)   Start      Dose/Rate Route Frequency Ordered Stop   11/02/18 0045  piperacillin-tazobactam (ZOSYN) IVPB 3.375 g     3.375 g 12.5 mL/hr over 240  Minutes Intravenous Every 8 hours 11/02/18 0043     11/01/18 1730  piperacillin-tazobactam (ZOSYN) IVPB 3.375 g     3.375 g 100 mL/hr over 30 Minutes Intravenous  Once 11/01/18 1718 11/01/18 1822      Assessment/Plan: 85yo woman  Afib on coumadin- INR 3.7. Converted to sinus around 9pm per bedside RN.   Cardiology evaluation and normalization of INR will be required prior to surgery.   Cholelithiasis (symptomatic) and likely passed a stone  Afebrile overnight but had been febrile up to 102.4. LFTs downtrending. We discussed standard would be to proceed with laparoscopic cholecystectomy with possible cholangiogram. Discussed risks of surgery including bleeding, pain, scarring, intraabdominal injury specifically to the common bile duct and sequelae, bile leak, conversion to open surgery, blood clot, pneumonia, heart attack/ arrhythmia, stroke, failure to resolve symptoms, etc/ Questions welcomed and answered. Discussed alternative of forgoing surgery with risk of ongoing symptoms, recurrent cholecystitis/choledocholithiasis, cholangitis, pancreatitis etc.  She is agreeable to proceed with surgery. Anticipate we can do this in 1-2 days pending normalization of LFTs, normalization of INR, and cardiology eval.    LOS: 2 days    Samantha Foley 11/03/2018

## 2018-11-03 NOTE — Progress Notes (Signed)
Patient's computer chart reviewed and case discussed with my partner Dr. Therisa Doyne and agree with surgical note and would recommend a Intra-Op cholangiogram at the time of cholecystectomy and please call me if that is positive for CBD stones otherwise I agree patient probably passed a stone as LFTs continue to decrease and MRCP okay

## 2018-11-03 NOTE — Progress Notes (Signed)
   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 Milus Mallick 11/05/2018--INR up--if still ?  In am might consider low dose vit K, need INR ~ 1.8?  Splenic cysts Coincidental at this time--no furthe rwork-up Peri-op eval Atrial fibrillation, chads score >3/Coumadin HfpEF, last echo 10/2012 Appreciate Dr. Johnsie Cancel input---Cleared for surgery Amio GTT started 5/11, on Cardizem ~ 4 mets can do  Await Echo update Hiatal hernia Monitor\ Hypoglycemia  Change base to d5/0.9 saline + K  Do not challenge gut--she cannot tolerate even clears Uterine prolapse status post repair Pathological grieving Home alone We will get PT to see dependent on nursing mobility list dx here  Subjective/interval events   Less pain with only po water No fever some temps today, but lower grade  Objective     BP (!) 119/53   Pulse 69   Temp 98.3 F (36.8 C) (Oral)   Resp (!) 26   Ht 5\' 2"  (1.575 m)   Wt 70.3 kg   SpO2 97%   BMI 28.35 kg/m      Intake/Output Summary (Last 24 hours) at 11/03/2018 0831 Last data filed at 11/03/2018 0600 Gross per 24 hour  Intake 1448.7 ml  Output 900 ml  Net 548.7 ml    GPE Awake pleasant In nad-younger than stated age, good dentition-lucid eomi ncat No ict no pallor Neck soft cta b, without added sound abd soft-nt nd   DATA personally reviewed as of 11/03/18 and significant findings addressed below  Antibiotic/cultures  Zosyn since admit   Best practice:   VAP bundle ordered: n/a Glycemic control ordered: suagrs a little low 70-80 DVT prophylaxis w: Therapeutic coumadin Disposition --SDU today as on 2 separate gtt for rate control Likely tx out am  Code status:    GOC discussion with HCPOA nad was performed on nad.    TIME-20  Verneita Griffes, MD [facility] 3:11 PM

## 2018-11-03 NOTE — Consult Note (Signed)
Cardiology Consultation:   Patient ID: [name]; [id number]; [birth year]   Admit date: 11/01/2018 Date of Consult: 11/03/2018  Primary Care Provider: Levin Erp, MD Primary Cardiologist: No primary care provider on file. Primary Electrophysiologist:  Virl Axe, MD    Patient Profile:   Samantha Foley is a 85 y.o. female with a PMH of HTN, atrial fibrillation on coumadin, HLD, GERD, and chronic back pain who is being seen today for the evaluation of preoperative assessment at the request of Dr. Verlon Au.  History of Present Illness:   [name] presented to the hospital 11/01/2018 with complaints of abdominal pain, malaise, fevers, nausea and vomiting for the past week. Abdominal US revealed cholelithiasis without acute cholecystitis and no biliary dilatation. MRCP revealed the same. She was started on IV antibiotics given concerns for suspected cholangitis as she presented with fever, leukocytosis, and transaminitis. GI consulted recommending surgical evaluation for cholecystectomy. Surgery consulted and recommended CCY this admission pending improvement in LFTs and normalization of INR. She was transitioned from coumadin to heparin for upcoming procedure. Cardiology asked to evaluate for preoperative assessment.   She follows with Roderic Palau, NP in the afib clinic and was last seen 07/2018 at which time she reported a low afib burden and was without cardiac complaints at the time of the visit. She is on coumadin and is followed at the coumadin clinic for management. No issues with bleeding. She is on diltiazem 30mg  prn for episodes of afib that do not spontaneously resolve within 1 hour. No prior heart disease history. Last ischemic evaluation was a NST in 2017 which was without ischemia with EF estimated to be 74%. Last echo in 2014 showed EF 65-70% with G1DD, mild AI, and mild MR.   Patient reports being fairly active prior to this admission. She lives alone and is able to  perform ADLs and household chores like cooking/cleaning without anginal complaints. She reports being able to walk a "good distance" without chest pain or SOB. She is unable to walk up a flight of stairs but attributes this to arthritis.   Past Medical History:  Diagnosis Date   Arthritis    Atrial fibrillation ([place])    reported but not yet documented   Chronic back pain    Dr. Tonita Cong   Chronic knee pain    Dr. Rhona Raider   Colon polyp    Dr. Wynetta Emery   Dysrhythmia    atrial fib- mild per pt- no meds   Food allergy    Scallops--no other seafood allergies   GERD (gastroesophageal reflux disease)    Hiatal hernia    Hyperlipidemia    Impaired glucose tolerance    Lactose intolerance    Osteopenia    PVC's (premature ventricular contractions)    Seasonal allergies    Vision problem    wears contacts    Past Surgical History:  Procedure Laterality Date   BACK SURGERY     several vertbea ae rcemntekd   ROBOTIC ASSISTED TOTAL HYSTERECTOMY WITH BILATERAL SALPINGO OOPHERECTOMY Bilateral 03/08/2016   Procedure: ROBOTIC ASSISTED TOTAL HYSTERECTOMY WITH BILATERAL SALPINGO OOPHORECTOMY/Uterosacral Ligament Suspension;  Surgeon: Princess Bruins, MD;  Location: [facility];  Service: Gynecology;  Laterality: Bilateral;   sebaceous cyst excision  2009   neck   TONSILLECTOMY AND ADENOIDECTOMY  age 64   VERTEBROPLASTY  05/2009   WISDOM TOOTH EXTRACTION       Home Medications:  Prior to Admission medications   Medication Sig Start Date End Date Taking? Authorizing  Provider  Cyanocobalamin (VITAMIN B-12 PO) Take 1 tablet by mouth daily.   Yes [provider]  fluticasone (FLONASE) 50 MCG/ACT nasal spray Place 1 spray into both nostrils daily as needed for allergies or rhinitis.   Yes [provider]  metoprolol tartrate (LOPRESSOR) 25 MG tablet TAKE 1/2 TABLET BY MOUTH 2 TIMES DAILY Patient taking differently: Take 12.5 mg by mouth 2 (two) times daily.   07/21/18  Yes Sherran Needs, NP  Multiple Vitamin (MULTIVITAMIN WITH MINERALS) TABS tablet Take 1 tablet by mouth daily.   Yes [provider]  Multiple Vitamins-Minerals (ICAPS AREDS 2 PO) Take 1 capsule by mouth 2 (two) times a day.   Yes [provider]  warfarin (COUMADIN) 1 MG tablet TAKE 1-1 1/2 TABLETS DAILY AS DIRECTED PER COUMADIN CLINIC Patient taking differently: Take 1-1.5 mg by mouth See admin instructions. 1.5 mg on Monday and fridays, all other days is 1 mg 09/01/18  Yes Deboraha Sprang, MD  diltiazem (CARDIZEM) 30 MG tablet Take 1 tablet every 4 hours AS NEEDED for AFIB  heart rate >100 0. Patient not taking: Reported on 05/29/2018 08/01/17   Sherran Needs, NP  oxyCODONE-acetaminophen (PERCOCET/ROXICET) 5-325 MG tablet Take 1 tablet by mouth every 6 (six) hours as needed for severe pain. Patient not taking: Reported on 11/01/2018 04/21/17   Albesa Seen, PA-C    Inpatient Medications: Scheduled Meds:  amiodarone  150 mg Intravenous Once   Chlorhexidine Gluconate Cloth  6 each Topical Daily   metoprolol succinate  25 mg Oral Daily   pantoprazole (PROTONIX) IV  40 mg Intravenous Q12H   Continuous Infusions:  0.9 % NaCl with KCl 40 mEq / L 75 mL/hr (11/03/18 0824)   amiodarone     Followed by   amiodarone     diltiazem (CARDIZEM) infusion 5 mg/hr (11/03/18 0237)   piperacillin-tazobactam (ZOSYN)  IV Stopped (11/03/18 0953)   PRN Meds: acetaminophen **OR** acetaminophen, alum & mag hydroxide-simeth, HYDROcodone-acetaminophen, [DISCONTINUED] ondansetron **OR** ondansetron (ZOFRAN) IV  Allergies:    Allergies  Allergen Reactions   Ciprofloxacin Shortness Of Breath and Rash   Scallops [Shellfish Allergy] Anaphylaxis   Apixaban Other (See Comments)    fatigue   Cherry Other (See Comments)    Vertigo   Demerol Other (See Comments)    Patient states it made her crazy and sleep for 12 hours   Doxycycline Nausea And Vomiting    Milk-Related Compounds Diarrhea   Penicillins Other (See Comments)    Did it involve swelling of the face/tongue/throat, SOB, or low BP? Unknown Did it involve sudden or severe rash/hives, skin peeling, or any reaction on the inside of your mouth or nose? Unknown Did you need to seek medical attention at a hospital or doctor's office? Unknown When did it last happen? If all above answers are NO, may proceed with cephalosporin use.    Rivaroxaban Other (See Comments)    Pt unsure but d/c use   Sudafed [Pseudoephedrine Hcl] Other (See Comments)    hyperactive    Social History:   Social History   Socioeconomic History   Marital status: Married    Spouse name: Not on file   Number of children: 2   Years of education: Not on file   Highest education level: Not on file  Occupational History   Occupation: retired (ran [employer] with her husband)  Social Designer, fashion/clothing strain: Not on file   Food insecurity:  Worry: Not on file    Inability: Not on file   Transportation needs:    Medical: Not on file    Non-medical: Not on file  Tobacco Use   Smoking status: Never Smoker   Smokeless tobacco: Never Used  Substance and Sexual Activity   Alcohol use: No    Comment: maybe one glass per year.   Drug use: No   Sexual activity: Not Currently  Lifestyle   Physical activity:    Days per week: Not on file    Minutes per session: Not on file   Stress: Not on file  Relationships   Social connections:    Talks on phone: Not on file    Gets together: Not on file    Attends religious service: Not on file    Active member of club or organization: Not on file    Attends meetings of clubs or organizations: Not on file    Relationship status: Not on file   Intimate partner violence:    Fear of current or ex partner: Not on file    Emotionally abused: Not on file    Physically abused: Not on file    Forced sexual activity: Not on file    Other Topics Concern   Not on file  Social History Narrative   Lives with her husband.  No pets.  Daughter in [place], [place]; Daughter in [place]. Cares for her 2 grandsons    Family History:    Family History  Problem Relation Age of Onset   Rheumatic fever Mother    Diabetes Mother    Heart disease Mother        rheumatic heart disease   Cancer Sister 19       breast cancer   Cancer Brother        bladder and prostate cancer   Migraines Daughter    Cancer Sister        leukemia at 27; colon cancer in her late 30's--family ?'s dx     ROS:  Please see the history of present illness.   All other ROS reviewed and negative.     Physical Exam/Data:   Vitals:   11/03/18 0600 11/03/18 0800 11/03/18 1000 11/03/18 1025  BP: (!) 119/53 99/80 109/72   Pulse: 69 71 74 79  Resp: (!) 26 (!) 30 (!) 25 20  Temp:  98.3 F (36.8 C)  100.3 F (37.9 C)  TempSrc:  Oral  Oral  SpO2: 97% 96% 95% 97%  Weight:      Height:        Intake/Output Summary (Last 24 hours) at 11/03/2018 1304 Last data filed at 11/03/2018 0600 Gross per 24 hour  Intake 1448.7 ml  Output 900 ml  Net 548.7 ml   Filed Weights   11/01/18 2237 11/02/18 1608 11/03/18 0536  Weight: 68 kg 71.8 kg 70.3 kg   Body mass index is 28.35 kg/m.   Physical exam per MD:  Affect appropriate Appears younger than stated age  32: normal Neck supple with no adenopathy JVP normal no bruits no thyromegaly Lungs clear with no wheezing and good diaphragmatic motion Heart:  S1/S2 SEM  murmur, no rub, gallop or click PMI normal Abdomen: faint BS RUQ tender  Distal pulses intact with no bruits No edema Neuro non-focal Skin warm and dry No muscular weakness   EKG:  The EKG was personally reviewed and demonstrates:  EKG on admission with sinus tachycardia, rate 101, no STE/D Telemetry:  Telemetry was personally reviewed and demonstrates:  NSR this am   Relevant CV Studies: Echocardiogram 2014: Study  Conclusions  - Left ventricle: The cavity size was normal. Wall thickness was increased in a pattern of mild LVH. There was focal basal hypertrophy. Systolic function was vigorous. The estimated ejection fraction was in the range of 65% to 70%. Wall motion was normal; there were no regional wall motion abnormalities. Doppler parameters are consistent with abnormal left ventricular relaxation (grade 1 diastolic dysfunction). - Aortic valve: Mild regurgitation. - Mitral valve: Mild regurgitation. - Atrial septum: No defect or patent foramen ovale was identified. - Pulmonary arteries: PA peak pressure: 51mm Hg (S).  NST 2017:  The left ventricular ejection fraction is hyperdynamic (>65%).  Nuclear stress EF: 74%.  There was no ST segment deviation noted during stress.  Defect 1: There is a small defect of moderate severity present in the mid inferolateral and apical lateral location. This is present at rest and not at stress. This is artifact.  The study is normal.  This is a low risk study.    Laboratory Data:  Chemistry Recent Labs  Lab 11/01/18 1428 11/02/18 0438 11/03/18 0623  NA 136 139 140  K 3.9 3.8 3.3*  CL 105 110 112*  CO2 22 19* 20*  GLUCOSE 119* 76 81  BUN 9 9 9   CREATININE 0.62 0.56 0.70  CALCIUM 8.4* 8.2* 7.9*  GFRNONAA >60 >60 >60  GFRAA >60 >60 >60  ANIONGAP 9 10 8     Recent Labs  Lab 11/01/18 1428 11/02/18 0438 11/03/18 0623  PROT 6.6 5.7* 5.6*  ALBUMIN 3.5 3.0* 2.7*  AST 758* 314* 73*  ALT 778* 510* 265*  ALKPHOS 238* 180* 142*  BILITOT 4.9* 2.5* 1.4*   Hematology Recent Labs  Lab 11/02/18 0438 11/02/18 1343 11/03/18 0623  WBC 9.0 8.1 5.4  RBC 3.79* 3.87 3.40*  HGB 11.0* 11.4* 10.1*  HCT 34.6* 35.2* 31.6*  MCV 91.3 91.0 92.9  MCH 29.0 29.5 29.7  MCHC 31.8 32.4 32.0  RDW 14.5 14.7 14.9  PLT 174 153 122*   Cardiac Enzymes Recent Labs  Lab 11/01/18 2147  TROPONINI <0.03   No results for input(s): TROPIPOC  in the last 168 hours.  BNPNo results for input(s): BNP, PROBNP in the last 168 hours.  DDimer No results for input(s): DDIMER in the last 168 hours.  Radiology/Studies:  Dg Chest 2 View  Result Date: 11/01/2018 CLINICAL DATA:  Abdominal pain, nausea, vomiting EXAM: CHEST - 2 VIEW COMPARISON:  01/13/2017 FINDINGS: Cardiomegaly. Both lungs are clear. The visualized skeletal structures are unremarkable. IMPRESSION: Cardiomegaly without acute abnormality of the lungs. Electronically Signed   By: Eddie Candle M.D.   On: 11/01/2018 15:36   US Abdomen Complete  Result Date: 11/01/2018 CLINICAL DATA:  85 year old female with abdominal pain and fever for 1 week. EXAM: ABDOMEN ULTRASOUND COMPLETE COMPARISON:  None. FINDINGS: Gallbladder: Mobile gallstones are identified, the largest measuring 7 mm. There is no evidence of gallbladder wall thickening, pericholecystic fluid or sonographic Murphy sign. Common bile duct: Diameter: 5 mm. No intrahepatic or extrahepatic biliary dilatation. Liver: 2 homogeneously hyperechoic LEFT hepatic lesions are identified measuring 0.8 cm and 1 cm. These most likely represent hemangiomas in the absence of primary malignancy. A LEFT hepatic cyst is present. Portal vein is patent on color Doppler imaging with normal direction of blood flow towards the liver. IVC: No abnormality visualized. Pancreas: Visualized portion unremarkable. Spleen: Size and appearance within normal limits. Right Kidney:  Length: 9.3 cm. Echogenicity within normal limits. No mass or hydronephrosis visualized. Left Kidney: Length: 10.5 cm. Echogenicity within normal limits. No mass or hydronephrosis visualized. Abdominal aorta: No aneurysm visualized. Other findings: None. IMPRESSION: 1. Cholelithiasis without sonographic evidence of acute cholecystitis. No biliary dilatation. 2. Two hyperechoic LEFT hepatic lesions measuring 0.8 cm and 1 cm. These likely represent hemangiomas. If there is history of primary  malignancy, consider elective MRI with and without contrast. 3. No other significant abnormalities. Electronically Signed   By: Margarette Canada M.D.   On: 11/01/2018 17:27   Mr 3d Recon At Scanner  Result Date: 11/02/2018 CLINICAL DATA:  Increased abdominal pain for 1 week. EXAM: MRI ABDOMEN WITHOUT AND WITH CONTRAST (INCLUDING MRCP) TECHNIQUE: Multiplanar multisequence MR imaging of the abdomen was performed both before and after the administration of intravenous contrast. Heavily T2-weighted images of the biliary and pancreatic ducts were obtained, and three-dimensional MRCP images were rendered by post processing. CONTRAST:  7 mL Gadavist COMPARISON:  None. FINDINGS: Lower chest:  Lung bases are clear. Hepatobiliary: Several small dependent gallstones. No gallbladder wall thickening or pericholecystic fluid. Common bile duct is normal caliber. No intra or extrahepatic duct dilatation. Several nonenhancing cysts within the liver. Focus of cortical scarring and retraction lateral RIGHT hepatic lobe probable small hemangioma at this site (image [id number]) Pancreas: Normal pancreatic parenchymal intensity. No ductal dilatation or inflammation. Spleen: Multiple lesions within the spleen. One dominant round lesion measures 3 cm x 3.3 cm and is hyperintense to splenic tissue ( image 29/9) on T2 weighted imaging. Additional smaller lesions also hyperintense on T2 weighted imaging. Lesions enhance similar splenic tissue with slightly hypo enhancing. Lesions are indeterminate, however comparison chest CT of 11/09/2015 demonstrates no significant change Adrenals/urinary tract: Adrenal glands and kidneys are normal. Stomach/Bowel: Moderate hiatal hernia stomach and limited of the small bowel is unremarkable Vascular/Lymphatic: Abdominal aortic normal caliber. No retroperitoneal periportal lymphadenopathy. Musculoskeletal: No aggressive osseous lesion IMPRESSION: 1. No biliary duct dilatation.  No choledocholithiasis. 2.  Gallstones without evidence of cholecystitis. 3. Simple hepatic cysts and probable hemangioma in the RIGHT hepatic lobe. 4. Normal pancreas. 5. Multiple splenic lesions with one dominant 3 cm lesion are indeterminate. However given no significant change from CT 11/09/2015, favor benign lesions. Electronically Signed   By: Suzy Bouchard M.D.   On: 11/02/2018 04:30   Mr Abdomen Mrcp Moise Boring Contast  Result Date: 11/02/2018 CLINICAL DATA:  Increased abdominal pain for 1 week. EXAM: MRI ABDOMEN WITHOUT AND WITH CONTRAST (INCLUDING MRCP) TECHNIQUE: Multiplanar multisequence MR imaging of the abdomen was performed both before and after the administration of intravenous contrast. Heavily T2-weighted images of the biliary and pancreatic ducts were obtained, and three-dimensional MRCP images were rendered by post processing. CONTRAST:  7 mL Gadavist COMPARISON:  None. FINDINGS: Lower chest:  Lung bases are clear. Hepatobiliary: Several small dependent gallstones. No gallbladder wall thickening or pericholecystic fluid. Common bile duct is normal caliber. No intra or extrahepatic duct dilatation. Several nonenhancing cysts within the liver. Focus of cortical scarring and retraction lateral RIGHT hepatic lobe probable small hemangioma at this site (image [id number]) Pancreas: Normal pancreatic parenchymal intensity. No ductal dilatation or inflammation. Spleen: Multiple lesions within the spleen. One dominant round lesion measures 3 cm x 3.3 cm and is hyperintense to splenic tissue ( image 29/9) on T2 weighted imaging. Additional smaller lesions also hyperintense on T2 weighted imaging. Lesions enhance similar splenic tissue with slightly hypo enhancing. Lesions are indeterminate, however comparison chest CT of 11/09/2015  demonstrates no significant change Adrenals/urinary tract: Adrenal glands and kidneys are normal. Stomach/Bowel: Moderate hiatal hernia stomach and limited of the small bowel is unremarkable  Vascular/Lymphatic: Abdominal aortic normal caliber. No retroperitoneal periportal lymphadenopathy. Musculoskeletal: No aggressive osseous lesion IMPRESSION: 1. No biliary duct dilatation.  No choledocholithiasis. 2. Gallstones without evidence of cholecystitis. 3. Simple hepatic cysts and probable hemangioma in the RIGHT hepatic lobe. 4. Normal pancreas. 5. Multiple splenic lesions with one dominant 3 cm lesion are indeterminate. However given no significant change from CT 11/09/2015, favor benign lesions. Electronically Signed   By: Suzy Bouchard M.D.   On: 11/02/2018 04:30    Assessment and Plan:   1. Preoperative assessment: patient awaiting cholecystectomy after presenting with abdominal pain and fever, found to have gallstones on abd [place]. Given transaminitis, likely passed a gallstone prior to arrival. Awaiting normalization of LFTs and INR. From a cardiac standpoint she does not have a history of CAD or CHF. No history of TIA/CVAs, DM, or CKD. She is fairly active still and able to easily complete 4 METs without anginal complaints. Last ischemic evaluation was in 2017 - NST negative for ischemia. Last echo in 2014 with EF 65-70%, G1DD, and mild AI/MR.  - Will repeat echo at this time. If no LV systolic dysfunction, wall motion abnormalities, or significant valvular abnormalities, do not anticipate additional cardiac work-up prior to surgery  2. Paroxysmal atrial fibrillation: episode of atrial fibrillation with RVR seen on telemetry yesterday with conversion to sinus rhyhm last night. Home metoprolol increased to 25mg  BID and she was started on a diltiazem gtt 11/02/2018 for rate control. CHA2DS2-VASc Score 3 (Female and Age >67). RVR likely driven by infection.  - Start amiodarone gtt for rhythm control in the perioperative setting - Continue diltiazem gtt  - Continue metoprolol  - INR still over 3 may be slow to come down due to NPO status Hopefully less than 2.2 for surgery Wendsday AM  assuming LFTls down and no further fevers.   3. Cholangitis: presented with fever and abdominal pain. Found to have gallstones on abd [place], leukocytosis, and transaminitis. On Zosyn. Surgery planning for CCY in 1-2 days once LFTs and INR normalizes - Continue management per primary team and surgery.   Clear to have surgery Echo ordered to assess EF since it's been 6 years. Converted to NSR staying on iv amiodarone Follow INR   For questions or updates, please contact [facility] Please consult [email or web address] for contact info under Cardiology/STEMI.   Jenkins Rouge

## 2018-11-03 NOTE — Progress Notes (Signed)
  Echocardiogram 2D Echocardiogram has been performed.  Samantha Foley M 11/03/2018, 2:53 PM

## 2018-11-04 LAB — COMPREHENSIVE METABOLIC PANEL
ALT: 195 U/L — ABNORMAL HIGH (ref 0–44)
AST: 39 U/L (ref 15–41)
Albumin: 2.7 g/dL — ABNORMAL LOW (ref 3.5–5.0)
Alkaline Phosphatase: 130 U/L — ABNORMAL HIGH (ref 38–126)
Anion gap: 7 (ref 5–15)
BUN: 5 mg/dL — ABNORMAL LOW (ref 8–23)
CO2: 19 mmol/L — ABNORMAL LOW (ref 22–32)
Calcium: 7.9 mg/dL — ABNORMAL LOW (ref 8.9–10.3)
Chloride: 111 mmol/L (ref 98–111)
Creatinine, Ser: 0.62 mg/dL (ref 0.44–1.00)
GFR calc Af Amer: >60 mL/min (ref >60)
GFR calc non Af Amer: >60 mL/min (ref >60)
Glucose, Bld: 111 mg/dL — ABNORMAL HIGH (ref 70–99)
Potassium: 3.6 mmol/L (ref 3.5–5.1)
Sodium: 137 mmol/L (ref 135–145)
Total Bilirubin: 0.9 mg/dL (ref 0.3–1.2)
Total Protein: 5.4 g/dL — ABNORMAL LOW (ref 6.5–8.1)

## 2018-11-04 LAB — CULTURE, BLOOD (ROUTINE X 2)
Special Requests: ADEQUATE
Special Requests: ADEQUATE

## 2018-11-04 LAB — MAGNESIUM: Magnesium: 1.9 mg/dL (ref 1.7–2.4)

## 2018-11-04 LAB — CBC WITH DIFFERENTIAL/PLATELET
Abs Immature Granulocytes: 0.01 10*3/uL (ref 0.00–0.07)
Basophils Absolute: 0 10*3/uL (ref 0.0–0.1)
Basophils Relative: 1 %
Eosinophils Absolute: 0.1 10*3/uL (ref 0.0–0.5)
Eosinophils Relative: 1 %
HCT: 32 % — ABNORMAL LOW (ref 36.0–46.0)
Hemoglobin: 10.2 g/dL — ABNORMAL LOW (ref 12.0–15.0)
Immature Granulocytes: 0 %
Lymphocytes Relative: 11 %
Lymphs Abs: 0.6 10*3/uL — ABNORMAL LOW (ref 0.7–4.0)
MCH: 29.3 pg (ref 26.0–34.0)
MCHC: 31.9 g/dL (ref 30.0–36.0)
MCV: 92 fL (ref 80.0–100.0)
Monocytes Absolute: 0.6 10*3/uL (ref 0.1–1.0)
Monocytes Relative: 12 %
Neutro Abs: 3.7 10*3/uL (ref 1.7–7.7)
Neutrophils Relative %: 75 %
Platelets: 128 10*3/uL — ABNORMAL LOW (ref 150–400)
RBC: 3.48 MIL/uL — ABNORMAL LOW (ref 3.87–5.11)
RDW: 15 % (ref 11.5–15.5)
WBC: 4.9 10*3/uL (ref 4.0–10.5)
nRBC: 0 % (ref 0.0–0.2)

## 2018-11-04 LAB — PROTIME-INR
INR: 4 — ABNORMAL HIGH (ref 0.8–1.2)
INR: 3.3 — ABNORMAL HIGH (ref 0.8–1.2)
Prothrombin Time: 38.4 seconds — ABNORMAL HIGH (ref 11.4–15.2)
Prothrombin Time: 33 seconds — ABNORMAL HIGH (ref 11.4–15.2)

## 2018-11-04 MED ORDER — SODIUM CHLORIDE 0.9 % IV SOLN
2.0000 g | INTRAVENOUS | Status: DC
  Administered 2018-11-04 – 2018-11-05 (×2): 2 g via INTRAVENOUS
  Filled 2018-11-04 (×2): qty 2
  Filled 2018-11-04: qty 20

## 2018-11-04 MED ORDER — METRONIDAZOLE IN NACL 5-0.79 MG/ML-% IV SOLN
500.0000 mg | Freq: Three times a day (TID) | INTRAVENOUS | Status: DC
  Administered 2018-11-04 – 2018-11-06 (×6): 500 mg via INTRAVENOUS
  Filled 2018-11-04 (×6): qty 100

## 2018-11-04 MED ORDER — MAGNESIUM SULFATE 2 GM/50ML IV SOLN
2.0000 g | Freq: Once | INTRAVENOUS | Status: AC
  Administered 2018-11-04: 2 g via INTRAVENOUS
  Filled 2018-11-04: qty 50

## 2018-11-04 MED ORDER — VITAMIN K1 10 MG/ML IJ SOLN
10.0000 mg | Freq: Once | INTRAMUSCULAR | Status: AC
  Administered 2018-11-04: 10 mg via SUBCUTANEOUS
  Filled 2018-11-04: qty 1

## 2018-11-04 MED ORDER — VITAMIN K1 10 MG/ML IJ SOLN
10.0000 mg | Freq: Once | INTRAMUSCULAR | Status: AC
  Administered 2018-11-04: 09:00:00 10 mg via SUBCUTANEOUS
  Filled 2018-11-04: qty 1

## 2018-11-04 MED ORDER — VITAMIN K1 10 MG/ML IJ SOLN
1.0000 mg | Freq: Once | INTRAVENOUS | Status: DC
  Filled 2018-11-04: qty 0.1

## 2018-11-04 NOTE — Progress Notes (Addendum)
Interim    85 year old CF Known atrial fibrillation chads score >3 on Cardizem/Coumadin Pathological grieving secondary to loss of husband 2016 Multiple orthopedic procedures Hiatal hernia Uterine prolapse status post hysterectomy 02/2016  Presented to [facility] 5/9 T-max 102 tachycardia 120 respiration 30 abdominal pain nausea vomiting 2 weeks anorexia Work-up =elevated LFT, CT abdomen no dilatation, MRCP as below-Zosyn initiated sepsis being treated  Impression  Cholangitis[-] Probable CBD stone that has passed Planning per GI/Gen surg Dr. Milus Mallick 11/05/2018 Splenic cysts Coincidental at this time--no furthe rwork-up Peri-op eval Atrial fibrillation, chads score >3/Coumadin HfpEF, last echo 10/2012 Appreciate Dr. Johnsie Cancel input---Cleared for surgery Amio GTT started 5/11, on Cardizem ~ 4 mets can do  --INR elevated-cardiology is managing the same-repeat INR this p.m. 1600 Hiatal hernia Monitor Hypoglycemia  Change base to d5/0.9 saline + K--K is improved, mag is being replaced by IV  Do not challenge gut--she cannot tolerate even clears Uterine prolapse status post repair-stable at this time Pathological grieving-seemingly resolved at this time Home alone We will get PT to see dependent on nursing mobility list dx here  Subjective/interval events   Pleasant oriented no further recurrence of pain low-grade fever noted 99.3 otherwise seems well Is on Cardizem and amiodarone drip requiring close monitoring  Objective     BP (!) 148/79   Pulse 85   Temp 98.7 F (37.1 C) (Oral)   Resp (!) 31   Ht 5\' 2"  (1.575 m)   Wt 70.3 kg   SpO2 95%   BMI 28.35 kg/m      Intake/Output Summary (Last 24 hours) at 11/04/2018 1110 Last data filed at 11/04/2018 0600 Gross per 24 hour  Intake 1053.11 ml  Output 1750 ml  Net -696.89 ml    GPE  Pleasant awake alert no real distress External ocular movements intact No icterus no pallor Abdomen soft no rebound  seems less tender than previous On monitors sinus with significant PVCs but no A. fib  DATA personally reviewed as of 11/04/18 and significant findings addressed below  Echocardiogram from 5/11  1. The left ventricle has normal systolic function with an ejection fraction of 60-65%. Left ventricular diastolic Doppler parameters are consistent with pseudonormalization.  2. The right ventricle has mildly reduced systolic function. The cavity was mildly enlarged. There is no increase in right ventricular wall thickness.  3. No evidence of mitral valve stenosis.  4. Aortic valve regurgitation is mild by color flow Doppler. No stenosis of the aortic valve.  5. The aortic root and ascending aorta are normal in size and structure.  6. The interatrial septum was not assessed.  Antibiotic/cultures  Zosyn since admit  Best practice:   VAP bundle ordered: n/a Glycemic control ordered: suagrs a little low 70-80 DVT prophylaxis w: Therapeutic coumadin Disposition --SDU today as on 2 separate gtt for rate control Likely tx out am  Code status:    [place] discussion with HCPOA nad was performed on nad.    TIME-20  Verneita Griffes, MD [facility] 11:10 AM   Current Facility-Administered Medications:  .  acetaminophen (TYLENOL) tablet 650 mg, 650 mg, Oral, Q6H PRN, 650 mg at 11/03/18 1025 **OR** acetaminophen (TYLENOL) suppository 650 mg, 650 mg, Rectal, Q6H PRN, Ryin Schillo, Jai-Gurmukh, MD .  alum & mag hydroxide-simeth (MAALOX/MYLANTA) 200-200-20 MG/5ML suspension 15 mL, 15 mL, Oral, Q4H PRN, Verlon Au, Jai-Gurmukh, MD, 15 mL at 11/02/18 1112 .  [COMPLETED] amiodarone (NEXTERONE) 1.8 mg/mL load via infusion 150 mg, 150 mg, Intravenous, Once, 150 mg  at 11/03/18 1635 **FOLLOWED BY** [EXPIRED] amiodarone (NEXTERONE PREMIX) 360-4.14 MG/200ML-% (1.8 mg/mL) IV infusion, 60 mg/hr, Intravenous, Continuous, Last Rate: 33.3 mL/hr at 11/03/18 2145, 60 mg/hr at 11/03/18 2145 **FOLLOWED BY** amiodarone (NEXTERONE  PREMIX) 360-4.14 MG/200ML-% (1.8 mg/mL) IV infusion, 30 mg/hr, Intravenous, Continuous, Josue Hector, MD, Last Rate: 16.67 mL/hr at 11/04/18 0654, 30 mg/hr at 11/04/18 0654 .  Chlorhexidine Gluconate Cloth 2 % PADS 6 each, 6 each, Topical, Daily, Nita Sells, MD, 6 each at 11/04/18 0929 .  dextrose 5 % and 0.9 % NaCl with KCl 40 mEq/L infusion, , Intravenous, Continuous, Neeraj Housand, Jai-Gurmukh, MD, Last Rate: 75 mL/hr at 11/04/18 0600 .  diltiazem (CARDIZEM) 100 mg in dextrose 5 % 100 mL (1 mg/mL) infusion, 5-15 mg/hr, Intravenous, Continuous, Sybilla Malhotra, Jai-Gurmukh, MD, Last Rate: 5 mL/hr at 11/03/18 1713, 5 mg/hr at 11/03/18 1713 .  HYDROcodone-acetaminophen (NORCO/VICODIN) 5-325 MG per tablet 1-2 tablet, 1-2 tablet, Oral, Q4H PRN, Baylie Drakes, Jai-Gurmukh, MD .  magnesium sulfate IVPB 2 g 50 mL, 2 g, Intravenous, Once, Aubri Gathright, Jai-Gurmukh, MD .  metoprolol succinate (TOPROL-XL) 24 hr tablet 25 mg, 25 mg, Oral, Daily, Sorina Derrig, Jai-Gurmukh, MD, 25 mg at 11/04/18 0917 .  [DISCONTINUED] ondansetron (ZOFRAN) tablet 4 mg, 4 mg, Oral, Q6H PRN **OR** ondansetron (ZOFRAN) injection 4 mg, 4 mg, Intravenous, Q6H PRN, Monserrath Junio, Jai-Gurmukh, MD .  pantoprazole (PROTONIX) injection 40 mg, 40 mg, Intravenous, Q12H, Marlen Koman, Jai-Gurmukh, MD, 40 mg at 11/04/18 0917 .  piperacillin-tazobactam (ZOSYN) IVPB 3.375 g, 3.375 g, Intravenous, Q8H, Early Steel, Jai-Gurmukh, MD, Last Rate: 12.5 mL/hr at 11/04/18 0539, 3.375 g at 11/04/18 0539

## 2018-11-04 NOTE — Anesthesia Preprocedure Evaluation (Addendum)
Anesthesia Evaluation  Patient identified by MRN, date of birth, ID band Patient awake    Reviewed: Allergy & Precautions, NPO status , Patient's Chart, lab work & pertinent test results, reviewed documented beta blocker date and time   History of Anesthesia Complications Negative for: history of anesthetic complications  Airway Mallampati: II  TM Distance: >3 FB Neck ROM: Full    Dental  (+) Teeth Intact, Dental Advisory Given   Pulmonary neg pulmonary ROS,    Pulmonary exam normal breath sounds clear to auscultation       Cardiovascular Normal cardiovascular exam+ dysrhythmias (on coumadin (INR 1.7 today), on diltiazem and amiodarone infusions d/t RVR in setting of sepsis) Atrial Fibrillation  Rhythm:Regular Rate:Normal  TTE 11/03/18: EF 60-65%, mild RV enlargement with mildly reduced systolic function, mild AVR   Afib with RVR in setting of infection, previously on amio and dilt gtt's   Neuro/Psych negative neurological ROS     GI/Hepatic Neg liver ROS, hiatal hernia, GERD  Controlled,Acute cholangitis   Endo/Other  negative endocrine ROS  Renal/GU negative Renal ROS     Musculoskeletal  (+) Arthritis ,   Abdominal   Peds  Hematology negative hematology ROS (+)   Anesthesia Other Findings Day of surgery medications reviewed with the patient.  Reproductive/Obstetrics                           Anesthesia Physical Anesthesia Plan  ASA: III  Anesthesia Plan: General   Post-op Pain Management:    Induction: Intravenous, Rapid sequence and Cricoid pressure planned  PONV Risk Score and Plan: 4 or greater and Ondansetron, Dexamethasone and Treatment may vary due to age or medical condition  Airway Management Planned: Oral ETT  Additional Equipment:   Intra-op Plan:   Post-operative Plan: Extubation in OR  Informed Consent: I have reviewed the patients History and Physical, chart,  labs and discussed the procedure including the risks, benefits and alternatives for the proposed anesthesia with the patient or authorized representative who has indicated his/her understanding and acceptance.     Dental advisory given  Plan Discussed with: CRNA  Anesthesia Plan Comments:       Anesthesia Quick Evaluation

## 2018-11-04 NOTE — Progress Notes (Signed)
Subjective:  Denies SSCP, palpitations or Dyspnea Only taking water  Objective:  Vitals:   11/04/18 0200 11/04/18 0300 11/04/18 0400 11/04/18 0754  BP: (!) 147/65  (!) 151/59   Pulse: 75 77 74   Resp: (!) 29 (!) 33 (!) 27   Temp:   99.3 F (37.4 C) 98.7 F (37.1 C)  TempSrc:   Oral Oral  SpO2: 95% 96% 93%   Weight:      Height:        Intake/Output from previous day:  Intake/Output Summary (Last 24 hours) at 11/04/2018 0817 Last data filed at 11/04/2018 0600 Gross per 24 hour  Intake 1053.11 ml  Output 1750 ml  Net -696.89 ml    Physical Exam: Affect appropriate Healthy:  appears stated age HEENT: normal Neck supple with no adenopathy JVP normal no bruits no thyromegaly Lungs clear with no wheezing and good diaphragmatic motion Heart:  S1/S2 no murmur, no rub, gallop or click PMI normal Abdomen: RUQ tender  no bruit.  No HSM or HJR Distal pulses intact with no bruits No edema Neuro non-focal Skin warm and dry No muscular weakness   Lab Results: Basic Metabolic Panel: Recent Labs    11/02/18 0438 11/03/18 0623 11/04/18 0303  NA 139 140 137  K 3.8 3.3* 3.6  CL 110 112* 111  CO2 19* 20* 19*  GLUCOSE 76 81 111*  BUN 9 9 5*  CREATININE 0.56 0.70 0.62  CALCIUM 8.2* 7.9* 7.9*  MG 2.1  --  1.9  PHOS 2.4*  --   --    Liver Function Tests: Recent Labs    11/03/18 0623 11/04/18 0303  AST 73* 39  ALT 265* 195*  ALKPHOS 142* 130*  BILITOT 1.4* 0.9  PROT 5.6* 5.4*  ALBUMIN 2.7* 2.7*   Recent Labs    11/01/18 1443 11/03/18 0623  LIPASE 26 35   CBC: Recent Labs    11/03/18 0623 11/04/18 0303  WBC 5.4 4.9  NEUTROABS 4.4 3.7  HGB 10.1* 10.2*  HCT 31.6* 32.0*  MCV 92.9 92.0  PLT 122* 128*   Cardiac Enzymes: Recent Labs    11/01/18 2147  TROPONINI <0.03   Thyroid Function Tests: Recent Labs    11/02/18 0438  TSH 0.532    Imaging: No results found.  Cardiac Studies:  ECG: SR rate 67 normal    Telemetry: afib rates 80   Echo: EF 60-65% mild AR mild MR similar to 2014   Medications:   . Chlorhexidine Gluconate Cloth  6 each Topical Daily  . metoprolol succinate  25 mg Oral Daily  . pantoprazole (PROTONIX) IV  40 mg Intravenous Q12H  . phytonadione  10 mg Subcutaneous Once     . amiodarone 30 mg/hr (11/04/18 0654)  . dextrose 5 % and 0.9 % NaCl with KCl 40 mEq/L 75 mL/hr at 11/04/18 0600  . diltiazem (CARDIZEM) infusion 5 mg/hr (11/03/18 1713)  . phytonadione (VITAMIN K) IV    . piperacillin-tazobactam (ZOSYN)  IV 3.375 g (11/04/18 0539)    Assessment/Plan:   PAF:  Going in/out PAF rates are fine INR over 4 No vitamin K in diet as she has not taken any food. LFT;s coming down no fever but cannot get surgery in am if INR too high have written for a dose of vitamin K [place] 10 mg this am Continue amiodarone iv for rate and rhythm control  GB:  On Zosyn improved afebrile for surgery when INR less than 2.2  Jenkins Rouge 11/04/2018, 8:17 AM

## 2018-11-04 NOTE — Progress Notes (Addendum)
   Subjective/Chief Complaint: No acute change    Objective: Vital signs in last 24 hours: Temp:  [98.4 F (36.9 C)-100.3 F (37.9 C)] 98.7 F (37.1 C) (05/12 0754) Pulse Rate:  [62-79] 74 (05/12 0400) Resp:  [18-33] 27 (05/12 0400) BP: (89-151)/(56-90) 151/59 (05/12 0400) SpO2:  [93 %-98 %] 93 % (05/12 0400) Last BM Date: 11/01/18  Intake/Output from previous day: 05/11 0701 - 05/12 0700 In: 1053.1 [P.O.:530; I.V.:473.1; IV Piggyback:50] Out: 1750 [Urine:1750] Intake/Output this shift: No intake/output data recorded.  General appearance: alert and cooperative Resp: clear to auscultation bilaterally Cardio: regular rate and rhythm GI: soft, non-tender; bowel sounds normal; no masses,  no organomegaly Skin: Skin color, texture, turgor normal. No rashes or lesions Neurologic: Grossly normal  Lab Results:  Recent Labs    11/03/18 0623 11/04/18 0303  WBC 5.4 4.9  HGB 10.1* 10.2*  HCT 31.6* 32.0*  PLT 122* 128*   BMET Recent Labs    11/03/18 0623 11/04/18 0303  NA 140 137  K 3.3* 3.6  CL 112* 111  CO2 20* 19*  GLUCOSE 81 111*  BUN 9 5*  CREATININE 0.70 0.62  CALCIUM 7.9* 7.9*   PT/INR Recent Labs    11/03/18 0623 11/04/18 0303  LABPROT 36.4* 38.4*  INR 3.7* 4.0*   ABG No results for input(s): PHART, HCO3 in the last 72 hours.  Invalid input(s): PCO2, PO2  Studies/Results: No results found.  Anti-infectives: Anti-infectives (From admission, onward)   Start     Dose/Rate Route Frequency Ordered Stop   11/02/18 0045  piperacillin-tazobactam (ZOSYN) IVPB 3.375 g     3.375 g 12.5 mL/hr over 240 Minutes Intravenous Every 8 hours 11/02/18 0043     11/01/18 1730  piperacillin-tazobactam (ZOSYN) IVPB 3.375 g     3.375 g 100 mL/hr over 30 Minutes Intravenous  Once 11/01/18 1718 11/01/18 1822      Assessment/Plan: 85yo woman  Afib on coumadin- INR 4. Converted to sinus currently.   Cardiology has evaluated, echo done, but she likely will need  vit K / FFP to correct INR prior to surgery  Cholelithiasis (symptomatic) and likely passed a stone  Afebrile, LFTs downtrending. Plan lap chole with IOC, hopefully tomorrow, pending INR. Vit K has been ordered for this morning by primary team   LOS: 3 days    Samantha Foley 11/04/2018

## 2018-11-05 ENCOUNTER — Inpatient Hospital Stay (HOSPITAL_COMMUNITY): Payer: PPO | Admitting: Certified Registered"

## 2018-11-05 ENCOUNTER — Encounter (HOSPITAL_COMMUNITY)
Admission: EM | Disposition: A | Discharge status: Home / Self Care | Payer: Self-pay | Source: Home / Self Care | Attending: Family Medicine

## 2018-11-05 HISTORY — PX: CHOLECYSTECTOMY: SHX55

## 2018-11-05 LAB — CBC WITH DIFFERENTIAL/PLATELET
Abs Immature Granulocytes: 0.02 10*3/uL (ref 0.00–0.07)
Basophils Absolute: 0.1 10*3/uL (ref 0.0–0.1)
Basophils Relative: 1 %
Eosinophils Absolute: 0.1 10*3/uL (ref 0.0–0.5)
Eosinophils Relative: 1 %
HCT: 35.4 % — ABNORMAL LOW (ref 36.0–46.0)
Hemoglobin: 11.5 g/dL — ABNORMAL LOW (ref 12.0–15.0)
Immature Granulocytes: 0 %
Lymphocytes Relative: 13 %
Lymphs Abs: 0.7 10*3/uL (ref 0.7–4.0)
MCH: 29.3 pg (ref 26.0–34.0)
MCHC: 32.5 g/dL (ref 30.0–36.0)
MCV: 90.1 fL (ref 80.0–100.0)
Monocytes Absolute: 0.8 10*3/uL (ref 0.1–1.0)
Monocytes Relative: 15 %
Neutro Abs: 4 10*3/uL (ref 1.7–7.7)
Neutrophils Relative %: 70 %
Platelets: 170 10*3/uL (ref 150–400)
RBC: 3.93 MIL/uL (ref 3.87–5.11)
RDW: 15 % (ref 11.5–15.5)
WBC: 5.7 10*3/uL (ref 4.0–10.5)
nRBC: 0 % (ref 0.0–0.2)

## 2018-11-05 LAB — MAGNESIUM: Magnesium: 2.1 mg/dL (ref 1.7–2.4)

## 2018-11-05 LAB — COMPREHENSIVE METABOLIC PANEL
ALT: 149 U/L — ABNORMAL HIGH (ref 0–44)
AST: 30 U/L (ref 15–41)
Albumin: 2.8 g/dL — ABNORMAL LOW (ref 3.5–5.0)
Alkaline Phosphatase: 129 U/L — ABNORMAL HIGH (ref 38–126)
Anion gap: 7 (ref 5–15)
BUN: <5 mg/dL — ABNORMAL LOW (ref 8–23)
CO2: 21 mmol/L — ABNORMAL LOW (ref 22–32)
Calcium: 8.3 mg/dL — ABNORMAL LOW (ref 8.9–10.3)
Chloride: 111 mmol/L (ref 98–111)
Creatinine, Ser: 0.57 mg/dL (ref 0.44–1.00)
GFR calc Af Amer: >60 mL/min (ref >60)
GFR calc non Af Amer: >60 mL/min (ref >60)
Glucose, Bld: 118 mg/dL — ABNORMAL HIGH (ref 70–99)
Potassium: 3.7 mmol/L (ref 3.5–5.1)
Sodium: 139 mmol/L (ref 135–145)
Total Bilirubin: 0.7 mg/dL (ref 0.3–1.2)
Total Protein: 6 g/dL — ABNORMAL LOW (ref 6.5–8.1)

## 2018-11-05 LAB — PROTIME-INR
INR: 1.7 — ABNORMAL HIGH (ref 0.8–1.2)
Prothrombin Time: 19.6 seconds — ABNORMAL HIGH (ref 11.4–15.2)

## 2018-11-05 SURGERY — LAPAROSCOPIC CHOLECYSTECTOMY WITH INTRAOPERATIVE CHOLANGIOGRAM
Anesthesia: General

## 2018-11-05 MED ORDER — DEXAMETHASONE SODIUM PHOSPHATE 10 MG/ML IJ SOLN
INTRAMUSCULAR | Status: DC | PRN
  Administered 2018-11-05: 4 mg via INTRAVENOUS

## 2018-11-05 MED ORDER — PHENYLEPHRINE 40 MCG/ML (10ML) SYRINGE FOR IV PUSH (FOR BLOOD PRESSURE SUPPORT)
PREFILLED_SYRINGE | INTRAVENOUS | Status: AC
  Filled 2018-11-05: qty 10

## 2018-11-05 MED ORDER — PROPOFOL 10 MG/ML IV BOLUS
INTRAVENOUS | Status: AC
  Filled 2018-11-05: qty 20

## 2018-11-05 MED ORDER — ROCURONIUM BROMIDE 10 MG/ML (PF) SYRINGE
PREFILLED_SYRINGE | INTRAVENOUS | Status: DC | PRN
  Administered 2018-11-05: 30 mg via INTRAVENOUS

## 2018-11-05 MED ORDER — ONDANSETRON HCL 4 MG/2ML IJ SOLN
INTRAMUSCULAR | Status: DC | PRN
  Administered 2018-11-05: 4 mg via INTRAVENOUS

## 2018-11-05 MED ORDER — ACETAMINOPHEN 10 MG/ML IV SOLN: 1000.0000 mg | Freq: Once | INTRAVENOUS | Status: DC | PRN

## 2018-11-05 MED ORDER — FENTANYL CITRATE (PF) 100 MCG/2ML IJ SOLN
25.0000 ug | INTRAMUSCULAR | Status: DC | PRN
  Administered 2018-11-05: 50 ug via INTRAVENOUS

## 2018-11-05 MED ORDER — LACTATED RINGERS IR SOLN
Status: DC | PRN
  Administered 2018-11-05: 1000 mL

## 2018-11-05 MED ORDER — PHENYLEPHRINE 40 MCG/ML (10ML) SYRINGE FOR IV PUSH (FOR BLOOD PRESSURE SUPPORT)
PREFILLED_SYRINGE | INTRAVENOUS | Status: DC | PRN
  Administered 2018-11-05: 40 ug via INTRAVENOUS

## 2018-11-05 MED ORDER — FENTANYL CITRATE (PF) 100 MCG/2ML IJ SOLN
INTRAMUSCULAR | Status: AC
  Filled 2018-11-05: qty 2

## 2018-11-05 MED ORDER — LACTATED RINGERS IV SOLN: INTRAVENOUS | Status: DC | PRN

## 2018-11-05 MED ORDER — FENTANYL CITRATE (PF) 250 MCG/5ML IJ SOLN
INTRAMUSCULAR | Status: DC | PRN
  Administered 2018-11-05 (×2): 50 ug via INTRAVENOUS

## 2018-11-05 MED ORDER — OXYCODONE HCL 5 MG PO TABS: 5.0000 mg | ORAL_TABLET | Freq: Once | ORAL | Status: DC | PRN

## 2018-11-05 MED ORDER — FENTANYL CITRATE (PF) 100 MCG/2ML IJ SOLN: 25.0000 ug | INTRAMUSCULAR | Status: DC | PRN

## 2018-11-05 MED ORDER — ROCURONIUM BROMIDE 10 MG/ML (PF) SYRINGE
PREFILLED_SYRINGE | INTRAVENOUS | Status: AC
  Filled 2018-11-05: qty 10

## 2018-11-05 MED ORDER — ONDANSETRON HCL 4 MG/2ML IJ SOLN
INTRAMUSCULAR | Status: AC
  Filled 2018-11-05: qty 2

## 2018-11-05 MED ORDER — TRAMADOL HCL 50 MG PO TABS: 50.0000 mg | ORAL_TABLET | Freq: Four times a day (QID) | ORAL | Status: DC | PRN

## 2018-11-05 MED ORDER — DOCUSATE SODIUM 100 MG PO CAPS
100.0000 mg | ORAL_CAPSULE | Freq: Two times a day (BID) | ORAL | Status: DC
  Administered 2018-11-05: 100 mg via ORAL
  Filled 2018-11-05 (×2): qty 1

## 2018-11-05 MED ORDER — PROPOFOL 10 MG/ML IV BOLUS
INTRAVENOUS | Status: DC | PRN
  Administered 2018-11-05: 140 mg via INTRAVENOUS

## 2018-11-05 MED ORDER — LACTATED RINGERS IV SOLN
INTRAVENOUS | Status: DC | PRN
  Administered 2018-11-05: 12:00:00 via INTRAVENOUS

## 2018-11-05 MED ORDER — SUCCINYLCHOLINE CHLORIDE 200 MG/10ML IV SOSY
PREFILLED_SYRINGE | INTRAVENOUS | Status: AC
  Filled 2018-11-05: qty 10

## 2018-11-05 MED ORDER — LIDOCAINE 2% (20 MG/ML) 5 ML SYRINGE
INTRAMUSCULAR | Status: AC
  Filled 2018-11-05: qty 5

## 2018-11-05 MED ORDER — LIDOCAINE 2% (20 MG/ML) 5 ML SYRINGE
INTRAMUSCULAR | Status: DC | PRN
  Administered 2018-11-05: 40 mg via INTRAVENOUS

## 2018-11-05 MED ORDER — EPHEDRINE SULFATE-NACL 50-0.9 MG/10ML-% IV SOSY
PREFILLED_SYRINGE | INTRAVENOUS | Status: DC | PRN
  Administered 2018-11-05: 10 mg via INTRAVENOUS
  Administered 2018-11-05: 5 mg via INTRAVENOUS
  Administered 2018-11-05: 10 mg via INTRAVENOUS

## 2018-11-05 MED ORDER — OXYCODONE HCL 5 MG/5ML PO SOLN: 5.0000 mg | Freq: Once | ORAL | Status: DC | PRN

## 2018-11-05 MED ORDER — BUPIVACAINE-EPINEPHRINE (PF) 0.25% -1:200000 IJ SOLN
INTRAMUSCULAR | Status: AC
  Filled 2018-11-05: qty 30

## 2018-11-05 MED ORDER — EPHEDRINE 5 MG/ML INJ
INTRAVENOUS | Status: AC
  Filled 2018-11-05: qty 10

## 2018-11-05 MED ORDER — BUPIVACAINE-EPINEPHRINE 0.25% -1:200000 IJ SOLN
INTRAMUSCULAR | Status: DC | PRN
  Administered 2018-11-05: 30 mL

## 2018-11-05 MED ORDER — SUGAMMADEX SODIUM 200 MG/2ML IV SOLN
INTRAVENOUS | Status: AC
  Filled 2018-11-05: qty 2

## 2018-11-05 MED ORDER — SODIUM CHLORIDE 0.9 % IV SOLN
INTRAVENOUS | Status: AC
  Filled 2018-11-05: qty 20

## 2018-11-05 MED ORDER — SUCCINYLCHOLINE CHLORIDE 200 MG/10ML IV SOSY
PREFILLED_SYRINGE | INTRAVENOUS | Status: DC | PRN
  Administered 2018-11-05: 120 mg via INTRAVENOUS

## 2018-11-05 MED ORDER — PROMETHAZINE HCL 25 MG/ML IJ SOLN: 6.2500 mg | INTRAMUSCULAR | Status: DC | PRN

## 2018-11-05 MED ORDER — SUGAMMADEX SODIUM 200 MG/2ML IV SOLN
INTRAVENOUS | Status: DC | PRN
  Administered 2018-11-05: 140 mg via INTRAVENOUS

## 2018-11-05 MED ORDER — DEXAMETHASONE SODIUM PHOSPHATE 10 MG/ML IJ SOLN
INTRAMUSCULAR | Status: AC
  Filled 2018-11-05: qty 1

## 2018-11-05 SURGICAL SUPPLY — 39 items
ADH SKN CLS APL DERMABOND .7 (GAUZE/BANDAGES/DRESSINGS) ×1
APL PRP STRL LF DISP 70% ISPRP (MISCELLANEOUS) ×1
APPLIER CLIP ROT 10 11.4 M/L (STAPLE) ×3
APR CLP MED LRG 11.4X10 (STAPLE) ×1
BAG SPEC RTRVL LRG 6X4 10 (ENDOMECHANICALS) ×1
CABLE HIGH FREQUENCY MONO STRZ (ELECTRODE) ×3 IMPLANT
CHLORAPREP W/TINT 26 (MISCELLANEOUS) ×3 IMPLANT
CLIP APPLIE ROT 10 11.4 M/L (STAPLE) ×1 IMPLANT
COVER MAYO STAND STRL (DRAPES) IMPLANT
COVER SURGICAL LIGHT HANDLE (MISCELLANEOUS) ×3 IMPLANT
COVER WAND RF STERILE (DRAPES) IMPLANT
DECANTER SPIKE VIAL GLASS SM (MISCELLANEOUS) ×3 IMPLANT
DERMABOND ADVANCED (GAUZE/BANDAGES/DRESSINGS) ×2
DERMABOND ADVANCED .7 DNX12 (GAUZE/BANDAGES/DRESSINGS) ×1 IMPLANT
DRAPE C-ARM 42X120 X-RAY (DRAPES) IMPLANT
ELECT REM PT RETURN 15FT ADLT (MISCELLANEOUS) ×3 IMPLANT
GLOVE BIO SURGEON STRL SZ 6 (GLOVE) ×3 IMPLANT
GLOVE INDICATOR 6.5 STRL GRN (GLOVE) ×3 IMPLANT
GOWN STRL REUS W/TWL LRG LVL3 (GOWN DISPOSABLE) ×3 IMPLANT
GOWN STRL REUS W/TWL XL LVL3 (GOWN DISPOSABLE) ×6 IMPLANT
GRASPER SUT TROCAR 14GX15 (MISCELLANEOUS) IMPLANT
HEMOSTAT SNOW SURGICEL 2X4 (HEMOSTASIS) IMPLANT
HEMOSTAT SURGICEL 2X14 (HEMOSTASIS) ×2 IMPLANT
KIT BASIN OR (CUSTOM PROCEDURE TRAY) ×3 IMPLANT
KIT TURNOVER KIT A (KITS) IMPLANT
NDL INSUFFLATION 14GA 120MM (NEEDLE) ×1 IMPLANT
NEEDLE INSUFFLATION 14GA 120MM (NEEDLE) ×3 IMPLANT
POUCH SPECIMEN RETRIEVAL 10MM (ENDOMECHANICALS) ×3 IMPLANT
SCISSORS LAP 5X35 DISP (ENDOMECHANICALS) ×3 IMPLANT
SET CHOLANGIOGRAPH MIX (MISCELLANEOUS) IMPLANT
SET IRRIG TUBING LAPAROSCOPIC (IRRIGATION / IRRIGATOR) ×3 IMPLANT
SET TUBE SMOKE EVAC HIGH FLOW (TUBING) ×3 IMPLANT
SLEEVE XCEL OPT CAN 5 100 (ENDOMECHANICALS) ×6 IMPLANT
SUT MNCRL AB 4-0 PS2 18 (SUTURE) ×3 IMPLANT
TOWEL OR 17X26 10 PK STRL BLUE (TOWEL DISPOSABLE) ×3 IMPLANT
TOWEL OR NON WOVEN STRL DISP B (DISPOSABLE) IMPLANT
TRAY LAPAROSCOPIC (CUSTOM PROCEDURE TRAY) ×3 IMPLANT
TROCAR BLADELESS OPT 5 100 (ENDOMECHANICALS) ×3 IMPLANT
TROCAR XCEL 12X100 BLDLESS (ENDOMECHANICALS) ×3 IMPLANT

## 2018-11-05 NOTE — Progress Notes (Signed)
    Subjective:  Denies SSCP, palpitations or Dyspnea Only taking water INR below 2 surgery latter today Had two 10 mg [place] doses of Vit K  Objective:  Vitals:   11/05/18 0100 11/05/18 0200 11/05/18 0400 11/05/18 0800  BP:      Pulse: (!) 124 (!) 133    Resp: (!) 32 (!) 27    Temp:   97.9 F (36.6 C) 97.8 F (36.6 C)  TempSrc:   Oral Oral  SpO2: 96% 95%    Weight:      Height:        Intake/Output from previous day:  Intake/Output Summary (Last 24 hours) at 11/05/2018 4034 Last data filed at 11/05/2018 0600 Gross per 24 hour  Intake 2850.45 ml  Output 1300 ml  Net 1550.45 ml    Physical Exam: Affect appropriate Healthy:  appears stated age HEENT: normal Neck supple with no adenopathy JVP normal no bruits no thyromegaly Lungs clear with no wheezing and good diaphragmatic motion Heart:  S1/S2 no murmur, no rub, gallop or click PMI normal Abdomen: RUQ tender  no bruit.  No HSM or HJR Distal pulses intact with no bruits No edema Neuro non-focal Skin warm and dry No muscular weakness   Lab Results: Basic Metabolic Panel: Recent Labs    11/04/18 0303 11/05/18 0246  NA 137 139  K 3.6 3.7  CL 111 111  CO2 19* 21*  GLUCOSE 111* 118*  BUN 5* <5*  CREATININE 0.62 0.57  CALCIUM 7.9* 8.3*  MG 1.9 2.1   Liver Function Tests: Recent Labs    11/04/18 0303 11/05/18 0246  AST 39 30  ALT 195* 149*  ALKPHOS 130* 129*  BILITOT 0.9 0.7  PROT 5.4* 6.0*  ALBUMIN 2.7* 2.8*   Recent Labs    11/03/18 0623  LIPASE 35   CBC: Recent Labs    11/04/18 0303 11/05/18 0246  WBC 4.9 5.7  NEUTROABS 3.7 4.0  HGB 10.2* 11.5*  HCT 32.0* 35.4*  MCV 92.0 90.1  PLT 128* 170   Cardiac Enzymes: No results for input(s): CKTOTAL, CKMB, CKMBINDEX, TROPONINI in the last 72 hours. Thyroid Function Tests: No results for input(s): TSH, T4TOTAL, T3FREE, THYROIDAB in the last 72 hours.  Invalid input(s): FREET3  Imaging: No results found.  Cardiac Studies:  ECG: SR  rate 67 normal    Telemetry: afib rates 80   Echo: EF 60-65% mild AR mild MR similar to 2014   Medications:   . Chlorhexidine Gluconate Cloth  6 each Topical Daily  . metoprolol succinate  25 mg Oral Daily  . pantoprazole (PROTONIX) IV  40 mg Intravenous Q12H     . amiodarone 30 mg/hr (11/05/18 0600)  . cefTRIAXone (ROCEPHIN)  IV Stopped (11/04/18 1415)  . dextrose 5 % and 0.9 % NaCl with KCl 40 mEq/L 75 mL/hr at 11/05/18 0600  . diltiazem (CARDIZEM) infusion 7.5 mg/hr (11/05/18 0600)  . metronidazole 500 mg (11/05/18 0630)    Assessment/Plan:   PAF:  NSR continue iv amiodarone and cardizem Anesthesia can stop cardizem if BP low in OR. INR 1.7 after Vit K. Cannot start heparin to cover as not clear when surgery will be done today  Please Surgery indicate when it is safe to restart heparin / coumadin post oop   GB:  On Zosyn improved afebrile for surgery today   Jenkins Rouge 11/05/2018, 8:19 AM

## 2018-11-05 NOTE — Anesthesia Postprocedure Evaluation (Signed)
Anesthesia Post Note  Patient: Samantha Foley  Procedure(s) Performed: LAPAROSCOPIC CHOLECYSTECTOMY (N/A )     Patient location during evaluation: PACU Anesthesia Type: General Level of consciousness: awake and alert Pain management: pain level controlled Vital Signs Assessment: post-procedure vital signs reviewed and stable Respiratory status: spontaneous breathing, nonlabored ventilation and respiratory function stable Cardiovascular status: blood pressure returned to baseline and stable Postop Assessment: no apparent nausea or vomiting Anesthetic complications: no    Last Vitals:  Vitals:   11/05/18 1400 11/05/18 1415  BP: (!) 114/52 (!) 109/50  Pulse: 74 73  Resp: 18 (!) 21  Temp:  36.6 C  SpO2: 94% 94%    Last Pain:  Vitals:   11/05/18 1415  TempSrc:   PainSc: [place]

## 2018-11-05 NOTE — Op Note (Signed)
Operative Note  Samantha Foley 85 y.o. female [id number]  11/05/2018  Surgeon: Clovis Riley MD  Assistant: OR staff  Procedure performed: Laparoscopic Cholecystectomy  Preop diagnosis: history of choledocholithiasis Post-op diagnosis/intraop findings: same  Specimens: gallbladder  EBL: 81OF  Complications: none  Description of procedure: After obtaining informed consent the patient was brought to the operating room. Prophylactic antibiotics were administered. SCD's were applied. General endotracheal anesthesia was initiated and a formal time-out was performed. The abdomen was prepped and draped in the usual sterile fashion and the abdomen was entered using visiport technique in the left upper quadrant after instilling the site with local. Insufflation to 22mmHg was obtained and gross inspection revealed no evidence of injury from our entry or other intraabdominal abnormalities. Three 63mm trocars were introduced in the supraumbilical, right midclavicular and right anterior axillary lines under direct visualization and following infiltration with local. An 58mm trocar was placed in the epigastrium. The gallbladder was distended and had omental adhesions consistent with chronic cholecystitis. The gallbladder was retracted cephalad. Omental adhesions were carefully taken down with cautery and blunt dissection until the infundibulum could be grasped and retracted laterally.  A combination of hook electrocautery and blunt dissection was utilized to clear the peritoneum from the neck and cystic duct, circumferentially isolating the cystic artery and cystic duct and lifting the gallbladder from the cystic plate. The critical view of safety was achieved with the cystic artery, cystic duct, and liver bed visualized between them with no other structures. The cystic duct was quite short and a cholangiogram was not attempted. The artery was clipped with a single clip proximally and distally and divided  as was the cystic duct with three clips on the proximal end. The gallbladder was dissected from the liver plate using electrocautery. We did encounter a small amount of bleeding from a small posterior branch cystic artery coming up onto the gallbladder. This was controlled with clips and cautery. Once freed the gallbladder was placed in an endocatch bag and removed intact through the epigastric trocar site. A small amount of bleeding on the liver bed was controlled with cautery. This was aspirated and the right upper quadrant was irrigated copiously until the effluent was clear. Hemostasis was once again confirmed, and reinspection of the abdomen revealed no injuries. The clips were well opposed without any bile leak from the duct or the liver bed. A piece of surgicel was placed in the liver bed. The 36mm trocar site in the epigastrium was closed with a 0 vicryl in the fascia under direct visualization using a PMI device. The abdomen was desufflated and all trocars removed. The skin incisions were closed with running subcuticular monocryl and Dermabond. The patient was awakened, extubated and transported to the recovery room in stable condition.   All counts were correct at the completion of the case.

## 2018-11-05 NOTE — Progress Notes (Signed)
   Subjective/Chief Complaint: No acute change. INR down to 1.7 today    Objective: Vital signs in last 24 hours: Temp:  [97.9 F (36.6 C)-98.7 F (37.1 C)] 97.9 F (36.6 C) (05/13 0400) Pulse Rate:  [71-133] 133 (05/13 0200) Resp:  [20-33] 27 (05/13 0200) BP: (141-148)/(64-87) 141/87 (05/12 1600) SpO2:  [94 %-96 %] 95 % (05/13 0200) Last BM Date: 11/04/18  Intake/Output from previous day: 05/12 0701 - 05/13 0700 In: 2850.5 [P.O.:124; I.V.:2331.1; IV Piggyback:395.3] Out: 1300 [Urine:1300] Intake/Output this shift: No intake/output data recorded.  General appearance: alert and cooperative Resp: clear to auscultation bilaterally Cardio: regular rate and rhythm GI: soft, non-tender; bowel sounds normal; no masses,  no organomegaly Skin: Skin color, texture, turgor normal. No rashes or lesions Neurologic: Grossly normal  Lab Results:  Recent Labs    11/04/18 0303 11/05/18 0246  WBC 4.9 5.7  HGB 10.2* 11.5*  HCT 32.0* 35.4*  PLT 128* 170   BMET Recent Labs    11/04/18 0303 11/05/18 0246  NA 137 139  K 3.6 3.7  CL 111 111  CO2 19* 21*  GLUCOSE 111* 118*  BUN 5* <5*  CREATININE 0.62 0.57  CALCIUM 7.9* 8.3*   PT/INR Recent Labs    11/04/18 1544 11/05/18 0246  LABPROT 33.0* 19.6*  INR 3.3* 1.7*   ABG No results for input(s): PHART, HCO3 in the last 72 hours.  Invalid input(s): PCO2, PO2  Studies/Results: No results found.  Anti-infectives: Anti-infectives (From admission, onward)   Start     Dose/Rate Route Frequency Ordered Stop   11/04/18 1400  cefTRIAXone (ROCEPHIN) 2 g in sodium chloride 0.9 % 100 mL IVPB     2 g 200 mL/hr over 30 Minutes Intravenous Every 24 hours 11/04/18 1133     11/04/18 1400  metroNIDAZOLE (FLAGYL) IVPB 500 mg     500 mg 100 mL/hr over 60 Minutes Intravenous Every 8 hours 11/04/18 1133     11/02/18 0045  piperacillin-tazobactam (ZOSYN) IVPB 3.375 g  Status:  Discontinued     3.375 g 12.5 mL/hr over 240 Minutes  Intravenous Every 8 hours 11/02/18 0043 11/04/18 1133   11/01/18 1730  piperacillin-tazobactam (ZOSYN) IVPB 3.375 g     3.375 g 100 mL/hr over 30 Minutes Intravenous  Once 11/01/18 1718 11/01/18 1822      Assessment/Plan: 85yo woman  Afib on coumadin- INR down to 1.7. Converted to sinus currently.   Cardiology has evaluated, echo done, and cleared   Cholelithiasis (symptomatic) and likely passed a stone  Afebrile, LFTs downtrending. Plan lap chole with IOC this afternoon. I have previously discussed the surgery, risks/ benefits/ alternatives with her as documented in Monday's progress note. She remains agreeable to proceed with surgery.   LOS: 4 days    Clovis Riley 11/05/2018

## 2018-11-05 NOTE — Progress Notes (Signed)
PROGRESS NOTE    Samantha Foley  AST:[id number] DOB: 02-16-1930 DOA: 11/01/2018 PCP: Levin Erp, MD   Brief Narrative:  85 year old female with known history of atrial fibrillation and multiple orthopedic fractures and hiatal hernia presented to [facility] on 5 9 with temperature tachycardia and tachypnea as well as abdominal pain.  Based on initial work-up in the emergency department, she was diagnosed with acute cholangitis.  MRCP and ultrasound of the abdomen showed cholelithiasis and possibly choledocholithiasis with no signs of cholecystitis.  She initially had elevated LFTs which have improved ever since giving the impression that she probably has passed the stone.  She has been seen by GI and there are no plans for ERCP.  She is being seen by general surgery and plan is for laparoscopic cholecystectomy today with IOC.  Consultants:   General surgery and GI and cardiology  Procedures:   None  Antimicrobials:   Rocephin   Subjective: Patient seen and examined.  She has no complaints.  No abdominal pain.  No palpitation or chest pain.  She is alert and oriented.  Objective: Vitals:   11/05/18 0000 11/05/18 0100 11/05/18 0200 11/05/18 0400  BP:      Pulse: 97 (!) 124 (!) 133   Resp: (!) 33 (!) 32 (!) 27   Temp: 98.7 F (37.1 C)   97.9 F (36.6 C)  TempSrc: Oral   Oral  SpO2: 95% 96% 95%   Weight:      Height:        Intake/Output Summary (Last 24 hours) at 11/05/2018 0809 Last data filed at 11/05/2018 0600 Gross per 24 hour  Intake 2850.45 ml  Output 1300 ml  Net 1550.45 ml   Filed Weights   11/01/18 2237 11/02/18 1608 11/03/18 0536  Weight: 68 kg 71.8 kg 70.3 kg    Examination:  General exam: Appears calm and comfortable  Respiratory system: Clear to auscultation. Respiratory effort normal. Cardiovascular system: S1 & S2 heard, RRR. No JVD, murmurs, rubs, gallops or clicks. No pedal edema. Gastrointestinal system: Abdomen is nondistended, soft  and nontender. No organomegaly or masses felt. Normal bowel sounds heard. Central nervous system: Alert and oriented. No focal neurological deficits. Extremities: Symmetric 5 x 5 power. Skin: No rashes, lesions or ulcers Psychiatry: Judgement and insight appear normal. Mood & affect appropriate.    Data Reviewed: I have personally reviewed following labs and imaging studies  CBC: Recent Labs  Lab 11/01/18 1428 11/02/18 0438 11/02/18 1343 11/03/18 0623 11/04/18 0303 11/05/18 0246  WBC 13.4* 9.0 8.1 5.4 4.9 5.7  NEUTROABS 12.6*  --  7.1 4.4 3.7 4.0  HGB 12.6 11.0* 11.4* 10.1* 10.2* 11.5*  HCT 38.1 34.6* 35.2* 31.6* 32.0* 35.4*  MCV 89.9 91.3 91.0 92.9 92.0 90.1  PLT 183 174 153 122* 128* 989   Basic Metabolic Panel: Recent Labs  Lab 11/01/18 1428 11/02/18 0438 11/03/18 0623 11/04/18 0303 11/05/18 0246  NA 136 139 140 137 139  K 3.9 3.8 3.3* 3.6 3.7  CL 105 110 112* 111 111  CO2 22 19* 20* 19* 21*  GLUCOSE 119* 76 81 111* 118*  BUN 9 9 9  5* <5*  CREATININE 0.62 0.56 0.70 0.62 0.57  CALCIUM 8.4* 8.2* 7.9* 7.9* 8.3*  MG  --  2.1  --  1.9 2.1  PHOS  --  2.4*  --   --   --    GFR: Estimated Creatinine Clearance: 46.4 mL/min (by C-G formula based on SCr of 0.57 mg/dL).  Liver Function Tests: Recent Labs  Lab 11/01/18 1428 11/02/18 0438 11/03/18 0623 11/04/18 0303 11/05/18 0246  AST 758* 314* 73* 39 30  ALT 778* 510* 265* 195* 149*  ALKPHOS 238* 180* 142* 130* 129*  BILITOT 4.9* 2.5* 1.4* 0.9 0.7  PROT 6.6 5.7* 5.6* 5.4* 6.0*  ALBUMIN 3.5 3.0* 2.7* 2.7* 2.8*   Recent Labs  Lab 11/01/18 1443 11/03/18 0623  LIPASE 26 35   No results for input(s): AMMONIA in the last 168 hours. Coagulation Profile: Recent Labs  Lab 11/02/18 0438 11/03/18 0623 11/04/18 0303 11/04/18 1544 11/05/18 0246  INR 3.2* 3.7* 4.0* 3.3* 1.7*   Cardiac Enzymes: Recent Labs  Lab 11/01/18 2147  TROPONINI <0.03   BNP (last 3 results) No results for input(s): PROBNP in the last  8760 hours. HbA1C: No results for input(s): HGBA1C in the last 72 hours. CBG: Recent Labs  Lab 11/02/18 1537  GLUCAP 83   Lipid Profile: No results for input(s): CHOL, HDL, LDLCALC, TRIG, CHOLHDL, LDLDIRECT in the last 72 hours. Thyroid Function Tests: No results for input(s): TSH, T4TOTAL, FREET4, T3FREE, THYROIDAB in the last 72 hours. Anemia Panel: No results for input(s): VITAMINB12, FOLATE, FERRITIN, TIBC, IRON, RETICCTPCT in the last 72 hours. Sepsis Labs: Recent Labs  Lab 11/02/18 0438 11/02/18 0814 11/02/18 1343 11/02/18 1555 11/02/18 1656  PROCALCITON  --   --   --   --  2.11  LATICACIDVEN 0.8 0.9 1.2 1.4  --     Recent Results (from the past 240 hour(s))  SARS Coronavirus 2 (CEPHEID - Performed in [facility] hospital lab), Hosp Order     Status: None   Collection Time: 11/01/18  2:44 PM  Result Value Ref Range Status   SARS Coronavirus 2 NEGATIVE NEGATIVE Final    Comment: (NOTE) If result is NEGATIVE SARS-CoV-2 target nucleic acids are NOT DETECTED. The SARS-CoV-2 RNA is generally detectable in upper and lower  respiratory specimens during the acute phase of infection. The lowest  concentration of SARS-CoV-2 viral copies this assay can detect is 250  copies / mL. A negative result does not preclude SARS-CoV-2 infection  and should not be used as the sole basis for treatment or other  patient management decisions.  A negative result may occur with  improper specimen collection / handling, submission of specimen other  than nasopharyngeal swab, presence of viral mutation(s) within the  areas targeted by this assay, and inadequate number of viral copies  (<250 copies / mL). A negative result must be combined with clinical  observations, patient history, and epidemiological information. If result is POSITIVE SARS-CoV-2 target nucleic acids are DETECTED. The SARS-CoV-2 RNA is generally detectable in upper and lower  respiratory specimens dur ing the acute  phase of infection.  Positive  results are indicative of active infection with SARS-CoV-2.  Clinical  correlation with patient history and other diagnostic information is  necessary to determine patient infection status.  Positive results do  not rule out bacterial infection or co-infection with other viruses. If result is PRESUMPTIVE POSTIVE SARS-CoV-2 nucleic acids MAY BE PRESENT.   A presumptive positive result was obtained on the submitted specimen  and confirmed on repeat testing.  While 2019 novel coronavirus  (SARS-CoV-2) nucleic acids may be present in the submitted sample  additional confirmatory testing may be necessary for epidemiological  and / or clinical management purposes  to differentiate between  SARS-CoV-2 and other Sarbecovirus currently known to infect humans.  If clinically indicated additional testing with  an alternate test  methodology [phone]) is advised. The SARS-CoV-2 RNA is generally  detectable in upper and lower respiratory sp ecimens during the acute  phase of infection. The expected result is Negative. Fact Sheet for Patients:  StrictlyIdeas.no Fact Sheet for Healthcare Providers: BankingDealers.co.za This test is not yet approved or cleared by the [place] FDA and has been authorized for detection and/or diagnosis of SARS-CoV-2 by FDA under an Emergency Use Authorization (EUA).  This EUA will remain in effect (meaning this test can be used) for the duration of the COVID-19 declaration under Section 564(b)(1) of the Act, 21 U.S.C. section 360bbb-3(b)(1), unless the authorization is terminated or revoked sooner. Performed at [facility], [place] [address], [place], [place] [address]   Culture, blood (Routine x 2)     Status: Abnormal   Collection Time: 11/01/18  2:46 PM  Result Value Ref Range Status   Specimen Description   Final    BLOOD LEFT FOREARM Performed at [facility] [address], [place], [place] [address]    Special Requests   Final    BOTTLES DRAWN AEROBIC AND ANAEROBIC Blood Culture adequate volume Performed at [facility] [address], [place], [place] [address]    Culture  Setup Time   Final    GRAM NEGATIVE RODS IN BOTH AEROBIC AND ANAEROBIC BOTTLES CRITICAL RESULT CALLED TO, READ BACK BY AND VERIFIED WITH: Shelda Jakes PHARMD, AT 7371 11/02/18 BY Rush Landmark Performed at [facility], [place] [address], [place], [place] [address]    Culture ESCHERICHIA COLI (A)  Final   Report Status 11/04/2018 FINAL  Final   Organism ID, Bacteria ESCHERICHIA COLI  Final      Susceptibility   Escherichia coli - MIC*    AMPICILLIN 4 SENSITIVE Sensitive     CEFAZOLIN <=4 SENSITIVE Sensitive     CEFEPIME <=1 SENSITIVE Sensitive     CEFTAZIDIME <=1 SENSITIVE Sensitive     CEFTRIAXONE <=1 SENSITIVE Sensitive     CIPROFLOXACIN <=0.25 SENSITIVE Sensitive     GENTAMICIN <=1 SENSITIVE Sensitive     IMIPENEM <=0.25 SENSITIVE Sensitive     TRIMETH/SULFA <=20 SENSITIVE Sensitive     AMPICILLIN/SULBACTAM <=2 SENSITIVE Sensitive     PIP/TAZO <=4 SENSITIVE Sensitive     Extended ESBL NEGATIVE Sensitive     * ESCHERICHIA COLI  Culture, blood (Routine x 2)     Status: Abnormal   Collection Time: 11/01/18  2:46 PM  Result Value Ref Range Status   Specimen Description   Final    BLOOD RIGHT ANTECUBITAL Performed at [facility], [place] [address], [place], [place] [address]    Special Requests   Final    BOTTLES DRAWN AEROBIC AND ANAEROBIC Blood Culture adequate volume Performed at [facility] [address], [place], [place] [address]    Culture  Setup Time   Final    GRAM NEGATIVE RODS IN BOTH AEROBIC AND ANAEROBIC BOTTLES CRITICAL VALUE NOTED.  VALUE IS CONSISTENT WITH PREVIOUSLY REPORTED AND CALLED VALUE.    Culture (A)  Final    ESCHERICHIA COLI SUSCEPTIBILITIES PERFORMED ON PREVIOUS  CULTURE WITHIN THE LAST 5 DAYS. Performed at [facility], [place] [address] [place], [place] [address]    Report Status 11/04/2018 FINAL  Final  Blood Culture ID Panel (Reflexed)     Status: Abnormal   Collection Time: 11/01/18  2:46 PM  Result Value Ref Range Status  Enterococcus species NOT DETECTED NOT DETECTED Final   Listeria monocytogenes NOT DETECTED NOT DETECTED Final   Staphylococcus species NOT DETECTED NOT DETECTED Final   Staphylococcus aureus (BCID) NOT DETECTED NOT DETECTED Final   Streptococcus species NOT DETECTED NOT DETECTED Final   Streptococcus agalactiae NOT DETECTED NOT DETECTED Final   Streptococcus pneumoniae NOT DETECTED NOT DETECTED Final   Streptococcus pyogenes NOT DETECTED NOT DETECTED Final   Acinetobacter baumannii NOT DETECTED NOT DETECTED Final   Enterobacteriaceae species DETECTED (A) NOT DETECTED Final    Comment: Enterobacteriaceae represent a large family of gram-negative bacteria, not a single organism. CRITICAL RESULT CALLED TO, READ BACK BY AND VERIFIED WITH: Shelda Jakes PHARMD, AT 4132 11/02/18 BY D. VANHOOK    Enterobacter cloacae complex NOT DETECTED NOT DETECTED Final   Escherichia coli DETECTED (A) NOT DETECTED Final    Comment: CRITICAL RESULT CALLED TO, READ BACK BY AND VERIFIED WITH: Shelda Jakes PHARMD, AT 0740 11/02/18 BY D. VANHOOK    Klebsiella oxytoca NOT DETECTED NOT DETECTED Final   Klebsiella pneumoniae NOT DETECTED NOT DETECTED Final   Proteus species NOT DETECTED NOT DETECTED Final   Serratia marcescens NOT DETECTED NOT DETECTED Final   Carbapenem resistance NOT DETECTED NOT DETECTED Final   Haemophilus influenzae NOT DETECTED NOT DETECTED Final   Neisseria meningitidis NOT DETECTED NOT DETECTED Final   Pseudomonas aeruginosa NOT DETECTED NOT DETECTED Final   Candida albicans NOT DETECTED NOT DETECTED Final   Candida glabrata NOT DETECTED NOT DETECTED Final   Candida krusei NOT DETECTED NOT DETECTED Final   Candida  parapsilosis NOT DETECTED NOT DETECTED Final   Candida tropicalis NOT DETECTED NOT DETECTED Final    Comment: Performed at [facility], [place] [address], [place], [place] [address]  Culture, blood (single)     Status: None (Preliminary result)   Collection Time: 11/02/18  1:35 PM  Result Value Ref Range Status   Specimen Description   Final    BLOOD RIGHT HAND Performed at [facility], [place] [address], [place], [place] [address]    Special Requests   Final    BOTTLES DRAWN AEROBIC ONLY Blood Culture adequate volume Performed at [facility] [address], [place], [place] [address]    Culture   Final    NO GROWTH 2 DAYS Performed at [facility] [address], [place], [place] [address]    Report Status PENDING  Incomplete  MRSA PCR Screening     Status: None   Collection Time: 11/02/18  4:52 PM  Result Value Ref Range Status   MRSA by PCR NEGATIVE NEGATIVE Final    Comment:        The GeneXpert MRSA Assay (FDA approved for NASAL specimens only), is one component of a comprehensive MRSA colonization surveillance program. It is not intended to diagnose MRSA infection nor to guide or monitor treatment for MRSA infections. Performed at [facility], [place] [address], [place],  [address]       Radiology Studies: No results found.  Scheduled Meds: . Chlorhexidine Gluconate Cloth  6 each Topical Daily  . metoprolol succinate  25 mg Oral Daily  . pantoprazole (PROTONIX) IV  40 mg Intravenous Q12H   Continuous Infusions: . amiodarone 30 mg/hr (11/05/18 0600)  . cefTRIAXone (ROCEPHIN)  IV Stopped (11/04/18 1415)  . dextrose 5 % and 0.9 % NaCl with KCl 40 mEq/L 75 mL/hr at 11/05/18 0600  . diltiazem (CARDIZEM) infusion 7.5  mg/hr (11/05/18 0600)  . metronidazole 500 mg (11/05/18 0630)     LOS: 4 days   Assessment & Plan:   Active Problems:   GERD (gastroesophageal reflux disease)   Pure  hypercholesterolemia   Atrial fibrillation (HCC)   Cholangitis   Sepsis ([place])   Acute cholangitis: LFT is improving.  GI and general surgery on board.  Appreciate their help.  Plan for laparoscopic cholecystectomy with IOC today.  Continue Rocephin.  She has been cleared by cardiology for surgery.  Atrial fibrillation with RVR: She seems to be in sinus rhythm at this point in time.  She continues to be on amiodarone as well as Cardizem drip.  Managed by cardiology.  Appreciate their help.  Coumadin on hold.  INR 1.7.  Splenic cyst: Incidental finding.  No further work-up.  History of uterine prolapse status post repair.  Stable at this time.  Pathological grieving.  Resolved.  Lives home alone: Get PT OT to assess her after the surgery today.  DVT prophylaxis: SCD, INR 1.7, Coumadin on hold for surgery today. Code Status: Full code Family Communication: Discussed plan of care with the patient Disposition Plan: To be determined.   Time spent: 35 minutes   Darliss Cheney, MD [facility] Pager [phone]  If 7PM-7AM, please contact night-coverage [email or web address] Password [email or web address] 11/05/2018, 8:09 AM

## 2018-11-05 NOTE — Anesthesia Procedure Notes (Signed)
Procedure Name: Intubation Date/Time: 11/05/2018 12:18 PM Performed by: Eben Burow, CRNA Pre-anesthesia Checklist: Patient identified, Emergency Drugs available, Suction available, Patient being monitored and Timeout performed Patient Re-evaluated:Patient Re-evaluated prior to induction Oxygen Delivery Method: Circle system utilized Preoxygenation: Pre-oxygenation with 100% oxygen Induction Type: IV induction and Rapid sequence Laryngoscope Size: Mac and 3 Grade View: Grade II Tube type: Oral Tube size: 7.0 mm Number of attempts: 1 Airway Equipment and Method: Stylet Placement Confirmation: ETT inserted through vocal cords under direct vision,  positive ETCO2 and breath sounds checked- equal and bilateral Secured at: 21 cm Tube secured with: Tape Dental Injury: Teeth and Oropharynx as per pre-operative assessment

## 2018-11-05 NOTE — Transfer of Care (Signed)
Immediate Anesthesia Transfer of Care Note  Patient: Samantha Foley  Procedure(s) Performed: LAPAROSCOPIC CHOLECYSTECTOMY (N/A )  Patient Location: PACU  Anesthesia Type:General  Level of Consciousness: awake, alert  and oriented  Airway & Oxygen Therapy: Patient Spontanous Breathing and Patient connected to face mask oxygen  Post-op Assessment: Report given to RN and Post -op Vital signs reviewed and stable  Post vital signs: Reviewed and stable  Last Vitals:  Vitals Value Taken Time  BP 137/51 11/05/2018  1:28 PM  Temp 36.6 C 11/05/2018  1:28 PM  Pulse 81 11/05/2018  1:30 PM  Resp 24 11/05/2018  1:30 PM  SpO2 97 % 11/05/2018  1:30 PM  Vitals shown include unvalidated device data.  Last Pain:  Vitals:   11/05/18 1135  TempSrc:   PainSc: 0-No pain      Patients Stated Pain Goal: 0 (09/81/19 1478)  Complications: No apparent anesthesia complications

## 2018-11-06 ENCOUNTER — Encounter (HOSPITAL_COMMUNITY): Payer: Self-pay | Admitting: Surgery

## 2018-11-06 DIAGNOSIS — K8070 Calculus of gallbladder and bile duct without cholecystitis without obstruction: Secondary | ICD-10-CM

## 2018-11-06 LAB — COMPREHENSIVE METABOLIC PANEL
ALT: 123 U/L — ABNORMAL HIGH (ref 0–44)
AST: 58 U/L — ABNORMAL HIGH (ref 15–41)
Albumin: 2.5 g/dL — ABNORMAL LOW (ref 3.5–5.0)
Alkaline Phosphatase: 117 U/L (ref 38–126)
Anion gap: 8 (ref 5–15)
BUN: <5 mg/dL — ABNORMAL LOW (ref 8–23)
CO2: 22 mmol/L (ref 22–32)
Calcium: 8.2 mg/dL — ABNORMAL LOW (ref 8.9–10.3)
Chloride: 110 mmol/L (ref 98–111)
Creatinine, Ser: 0.55 mg/dL (ref 0.44–1.00)
GFR calc Af Amer: >60 mL/min (ref >60)
GFR calc non Af Amer: >60 mL/min (ref >60)
Glucose, Bld: 169 mg/dL — ABNORMAL HIGH (ref 70–99)
Potassium: 4.4 mmol/L (ref 3.5–5.1)
Sodium: 140 mmol/L (ref 135–145)
Total Bilirubin: 0.5 mg/dL (ref 0.3–1.2)
Total Protein: 5.6 g/dL — ABNORMAL LOW (ref 6.5–8.1)

## 2018-11-06 LAB — CBC WITH DIFFERENTIAL/PLATELET
Abs Immature Granulocytes: 0.02 10*3/uL (ref 0.00–0.07)
Basophils Absolute: 0 10*3/uL (ref 0.0–0.1)
Basophils Relative: 0 %
Eosinophils Absolute: 0 10*3/uL (ref 0.0–0.5)
Eosinophils Relative: 0 %
HCT: 31.4 % — ABNORMAL LOW (ref 36.0–46.0)
Hemoglobin: 9.9 g/dL — ABNORMAL LOW (ref 12.0–15.0)
Immature Granulocytes: 0 %
Lymphocytes Relative: 5 %
Lymphs Abs: 0.3 10*3/uL — ABNORMAL LOW (ref 0.7–4.0)
MCH: 29 pg (ref 26.0–34.0)
MCHC: 31.5 g/dL (ref 30.0–36.0)
MCV: 92.1 fL (ref 80.0–100.0)
Monocytes Absolute: 0.5 10*3/uL (ref 0.1–1.0)
Monocytes Relative: 8 %
Neutro Abs: 5.7 10*3/uL (ref 1.7–7.7)
Neutrophils Relative %: 87 %
Platelets: 161 10*3/uL (ref 150–400)
RBC: 3.41 MIL/uL — ABNORMAL LOW (ref 3.87–5.11)
RDW: 15.3 % (ref 11.5–15.5)
WBC: 6.5 10*3/uL (ref 4.0–10.5)
nRBC: 0 % (ref 0.0–0.2)

## 2018-11-06 LAB — PROTIME-INR
INR: 1.3 — ABNORMAL HIGH (ref 0.8–1.2)
Prothrombin Time: 16.4 seconds — ABNORMAL HIGH (ref 11.4–15.2)

## 2018-11-06 LAB — MAGNESIUM: Magnesium: 2 mg/dL (ref 1.7–2.4)

## 2018-11-06 MED ORDER — TRAMADOL HCL 50 MG PO TABS: 50.0000 mg | ORAL_TABLET | Freq: Four times a day (QID) | ORAL | Status: DC | PRN

## 2018-11-06 MED ORDER — METOPROLOL TARTRATE 12.5 MG HALF TABLET
12.5000 mg | ORAL_TABLET | Freq: Two times a day (BID) | ORAL | Status: DC
  Administered 2018-11-06: 12.5 mg via ORAL
  Filled 2018-11-06: qty 1

## 2018-11-06 MED ORDER — WARFARIN SODIUM 2 MG PO TABS
2.0000 mg | ORAL_TABLET | Freq: Once | ORAL | Status: AC
  Administered 2018-11-06: 2 mg via ORAL
  Filled 2018-11-06: qty 1

## 2018-11-06 MED ORDER — CEFUROXIME AXETIL 500 MG PO TABS: 500.0000 mg | ORAL_TABLET | Freq: Two times a day (BID) | ORAL | 0 refills | Status: AC

## 2018-11-06 MED ORDER — METRONIDAZOLE 500 MG PO TABS: 500.0000 mg | ORAL_TABLET | Freq: Three times a day (TID) | ORAL | 0 refills | Status: AC

## 2018-11-06 MED ORDER — ENOXAPARIN SODIUM 40 MG/0.4ML ~~LOC~~ SOLN
40.0000 mg | SUBCUTANEOUS | Status: DC
  Filled 2018-11-06: qty 0.4

## 2018-11-06 MED ORDER — WARFARIN - PHARMACIST DOSING INPATIENT: Freq: Every day | Status: DC

## 2018-11-06 NOTE — Progress Notes (Signed)
    Subjective:  Doing great post op day one Had PO apple sauce  Objective:  Vitals:   11/06/18 0400 11/06/18 0401 11/06/18 0500 11/06/18 0600  BP: (!) 134/119     Pulse: 71  70 70  Resp: 20  (!) 21 (!) 23  Temp: 97.9 F (36.6 C) 97.9 F (36.6 C)    TempSrc: Oral Oral    SpO2: 98%  91% 90%  Weight:      Height:        Intake/Output from previous day:  Intake/Output Summary (Last 24 hours) at 11/06/2018 6314 Last data filed at 11/06/2018 0600 Gross per 24 hour  Intake 1426.62 ml  Output 1350 ml  Net 76.62 ml    Physical Exam: Affect appropriate Healthy:  appears stated age HEENT: normal Neck supple with no adenopathy JVP normal no bruits no thyromegaly Lungs clear with no wheezing and good diaphragmatic motion Heart:  S1/S2 no murmur, no rub, gallop or click PMI normal Abdomen: Post lap choly drain out  no bruit.  No HSM or HJR Distal pulses intact with no bruits No edema Neuro non-focal Skin warm and dry No muscular weakness   Lab Results: Basic Metabolic Panel: Recent Labs    11/05/18 0246 11/06/18 0249  NA 139 140  K 3.7 4.4  CL 111 110  CO2 21* 22  GLUCOSE 118* 169*  BUN <5* <5*  CREATININE 0.57 0.55  CALCIUM 8.3* 8.2*  MG 2.1 2.0   Liver Function Tests: Recent Labs    11/05/18 0246 11/06/18 0249  AST 30 58*  ALT 149* 123*  ALKPHOS 129* 117  BILITOT 0.7 0.5  PROT 6.0* 5.6*  ALBUMIN 2.8* 2.5*   No results for input(s): LIPASE, AMYLASE in the last 72 hours. CBC: Recent Labs    11/05/18 0246 11/06/18 0249  WBC 5.7 6.5  NEUTROABS 4.0 5.7  HGB 11.5* 9.9*  HCT 35.4* 31.4*  MCV 90.1 92.1  PLT 170 161   Cardiac Enzymes: No results for input(s): CKTOTAL, CKMB, CKMBINDEX, TROPONINI in the last 72 hours. Thyroid Function Tests: No results for input(s): TSH, T4TOTAL, T3FREE, THYROIDAB in the last 72 hours.  Invalid input(s): FREET3  Imaging: No results found.  Cardiac Studies:  ECG: SR rate 67 normal    Telemetry: afib rates  80   Echo: EF 60-65% mild AR mild MR similar to 2014   Medications:   . Chlorhexidine Gluconate Cloth  6 each Topical Daily  . docusate sodium  100 mg Oral BID  . metoprolol succinate  25 mg Oral Daily  . pantoprazole (PROTONIX) IV  40 mg Intravenous Q12H     . amiodarone 30 mg/hr (11/05/18 1945)  . cefTRIAXone (ROCEPHIN)  IV 0 mL/hr at 11/04/18 1415  . dextrose 5 % and 0.9 % NaCl with KCl 40 mEq/L 75 mL/hr at 11/05/18 1944  . diltiazem (CARDIZEM) infusion Stopped (11/05/18 1222)  . metronidazole Stopped (11/06/18 9702)    Assessment/Plan:   PAF:  NSR will d/c iv drips can start coumadin today May take a while to get Rx given 2 doses of vit K pre op. Will arrange drive by INR check at [address] office Monday  GB:  Post op doing well taking PO advance diet  Jenkins Rouge 11/06/2018, 8:23 AM

## 2018-11-06 NOTE — Progress Notes (Addendum)
[place] for warfarin Indication: Afib  Allergies  Allergen Reactions  . Ciprofloxacin Shortness Of Breath and Rash  . Scallops [Shellfish Allergy] Anaphylaxis  . Apixaban Other (See Comments)    fatigue  . Cherry Other (See Comments)    Vertigo  . Demerol Other (See Comments)    Patient states it made her crazy and sleep for 12 hours  . Doxycycline Nausea And Vomiting  . Milk-Related Compounds Diarrhea  . Penicillins Other (See Comments)    Did it involve swelling of the face/tongue/throat, SOB, or low BP? Unknown Did it involve sudden or severe rash/hives, skin peeling, or any reaction on the inside of your mouth or nose? Unknown Did you need to seek medical attention at a hospital or doctor's office? Unknown When did it last happen? If all above answers are "NO", may proceed with cephalosporin use.   . Rivaroxaban Other (See Comments)    Pt unsure but d/c use  . Sudafed [Pseudoephedrine Hcl] Other (See Comments)    hyperactive    Patient Measurements: Height: 5\' 2"  (157.5 cm) Weight: 154 lb 15.7 oz (70.3 kg) IBW/kg (Calculated) : 50.1  Vital Signs: Temp: 97.9 F (36.6 C) (05/14 0401) Temp Source: Oral (05/14 0401) BP: 134/119 (05/14 0400) Pulse Rate: 70 (05/14 0600)  Labs: Recent Labs    11/04/18 0303 11/04/18 1544 11/05/18 0246 11/06/18 0249  HGB 10.2*  --  11.5* 9.9*  HCT 32.0*  --  35.4* 31.4*  PLT 128*  --  170 161  LABPROT 38.4* 33.0* 19.6* 16.4*  INR 4.0* 3.3* 1.7* 1.3*  CREATININE 0.62  --  0.57 0.55    Estimated Creatinine Clearance: 46.4 mL/min (by C-G formula based on SCr of 0.55 mg/dL).   Medical History: Past Medical History:  Diagnosis Date  . Arthritis   . Atrial fibrillation ([place])    reported but not yet documented  . Chronic back pain    Dr. Tonita Cong  . Chronic knee pain    Dr. Rhona Raider  . Colon polyp    Dr. Wynetta Emery  . Dysrhythmia    atrial fib- mild per pt- no meds  . Food allergy     Scallops--no other seafood allergies  . GERD (gastroesophageal reflux disease)   . Hiatal hernia   . Hyperlipidemia   . Impaired glucose tolerance   . Lactose intolerance   . Osteopenia   . PVC's (premature ventricular contractions)   . Seasonal allergies   . Vision problem    wears contacts    Medications:  Medications Prior to Admission  Medication Sig Dispense Refill Last Dose  . Cyanocobalamin (VITAMIN B-12 PO) Take 1 tablet by mouth daily.   10/31/2018 at Unknown time  . fluticasone (FLONASE) 50 MCG/ACT nasal spray Place 1 spray into both nostrils daily as needed for allergies or rhinitis.   Past Week at Unknown time  . metoprolol tartrate (LOPRESSOR) 25 MG tablet TAKE 1/2 TABLET BY MOUTH 2 TIMES DAILY (Patient taking differently: Take 12.5 mg by mouth 2 (two) times daily. ) 90 tablet 1 10/31/2018 at 12pm  . Multiple Vitamin (MULTIVITAMIN WITH MINERALS) TABS tablet Take 1 tablet by mouth daily.   10/31/2018 at Unknown time  . Multiple Vitamins-Minerals (ICAPS AREDS 2 PO) Take 1 capsule by mouth 2 (two) times a day.   10/31/2018 at Unknown time  . warfarin (COUMADIN) 1 MG tablet TAKE 1-1 1/2 TABLETS DAILY AS DIRECTED PER COUMADIN CLINIC (Patient taking differently: Take 1-1.5 mg by mouth  See admin instructions. 1.5 mg on Monday and fridays, all other days is 1 mg) 135 tablet 0 10/31/2018 at 7pm  . diltiazem (CARDIZEM) 30 MG tablet Take 1 tablet every 4 hours AS NEEDED for AFIB  heart rate >100 0. (Patient not taking: Reported on 05/29/2018) 45 tablet 2 Not Taking at Unknown time  . oxyCODONE-acetaminophen (PERCOCET/ROXICET) 5-325 MG tablet Take 1 tablet by mouth every 6 (six) hours as needed for severe pain. (Patient not taking: Reported on 11/01/2018) 10 tablet 0 Not Taking at Unknown time   Scheduled:  . Chlorhexidine Gluconate Cloth  6 each Topical Daily  . docusate sodium  100 mg Oral BID  . enoxaparin (LOVENOX) injection  40 mg Subcutaneous Q24H  . metoprolol tartrate  12.5 mg Oral BID   . pantoprazole (PROTONIX) IV  40 mg Intravenous Q12H  . warfarin  2 mg Oral Once  . [START ON 11/07/2018] Warfarin - Pharmacist Dosing Inpatient   Does not apply q1800   PRN: acetaminophen **OR** acetaminophen, alum & mag hydroxide-simeth, fentaNYL (SUBLIMAZE) injection, HYDROcodone-acetaminophen, [DISCONTINUED] ondansetron **OR** ondansetron (ZOFRAN) IV, traMADol  Assessment: 4 yoF with PMH HTN, Afib on warfarin, HLD, admitted 5/9 for cholangitis; underwent warfarin reversal and lap cholecystectomy on 5/13. Pharmacy consulted to resume warfarin on 5/14.   Baseline INR subtherapeutic after Vit K 10 mg IV x 2 on 5/13  Prior anticoagulation: warfarin 1 mg daily except 1.5 mg Mon and Fri  Significant events:  Today, 11/06/2018:  CBC: Hgb lower as expected postop, Plt stable WNL  INR SUBtherapeutic  Major drug interactions: Flagyl   No bleeding issues per nursing  CLD ordered, intake not charted  Goal of Therapy: INR 2-3  Plan:  Warfarin 2 mg PO today  Daily INR - will need close follow-up as resistance expected with large Vit K dose, but also started on Flagyl perioperatively; predicting discharge dose will be difficult as a result. However, current plan is for INR recheck on Monday, so at this time would recommend discharging on home warfarin regimen  VTE ppx with enoxaparin 40 mg SQ daily while INR subtherapeutic  CBC at least q72 hr while on warfarin  Monitor for signs of bleeding or thrombosis   Reuel Boom, PharmD, BCPS [phone] 11/06/2018, 10:34 AM

## 2018-11-06 NOTE — Discharge Instructions (Signed)
[facility] ______[facility] °LAPAROSCOPIC SURGERY: POST OP INSTRUCTIONS °Always review your discharge instruction sheet given to you by the facility where your surgery was performed. °IF YOU HAVE DISABILITY OR FAMILY LEAVE FORMS, YOU MUST BRING THEM TO THE OFFICE FOR PROCESSING.   °DO NOT GIVE THEM TO YOUR DOCTOR. ° °1. A prescription for pain medication may be given to you upon discharge.  Take your pain medication as prescribed, if needed.  If narcotic pain medicine is not needed, then you may take acetaminophen (Tylenol) or ibuprofen (Advil) as needed. °2. Take your usually prescribed medications unless otherwise directed. °3. If you need a refill on your pain medication, please contact your pharmacy.  They will contact our office to request authorization. Prescriptions will not be filled after 5pm or on week-ends. °4. You should follow a light diet the first few days after arrival home, such as soup and crackers, etc.  Be sure to include lots of fluids daily. °5. Most patients will experience some swelling and bruising in the area of the incisions.  Ice packs will help.  Swelling and bruising can take several days to resolve.  °6. It is common to experience some constipation if taking pain medication after surgery.  Increasing fluid intake and taking a stool softener (such as Colace) will usually help or prevent this problem from occurring.  A mild laxative (Milk of Magnesia or Miralax) should be taken according to package instructions if there are no bowel movements after 48 hours. °7. Unless discharge instructions indicate otherwise, you may remove your bandages 24-48 hours after surgery, and you may shower at that time.  You may have steri-strips (small skin tapes) in place directly over the incision.  These strips should be left on the skin for 7-10 days.  If your surgeon used skin glue on the incision, you may shower in 24 hours.  The glue will flake off over the next 2-3 weeks.  Any sutures or  staples will be removed at the office during your follow-up visit. °8. ACTIVITIES:  You may resume regular (light) daily activities beginning the next day--such as daily self-care, walking, climbing stairs--gradually increasing activities as tolerated.  You may have sexual intercourse when it is comfortable.  Refrain from any heavy lifting or straining until approved by your doctor. °a. You may drive when you are no longer taking prescription pain medication, you can comfortably wear a seatbelt, and you can safely maneuver your car and apply brakes. °b. RETURN TO WORK:  __________________________________________________________ °9. You should see your doctor in the office for a follow-up appointment approximately 2-3 weeks after your surgery.  Make sure that you call for this appointment within a day or two after you arrive home to insure a convenient appointment time. °10. OTHER INSTRUCTIONS: __________________________________________________________________________________________________________________________ __________________________________________________________________________________________________________________________ °WHEN TO CALL YOUR DOCTOR: °1. Fever over 101.0 °2. Inability to urinate °3. Continued bleeding from incision. °4. Increased pain, redness, or drainage from the incision. °5. Increasing abdominal pain ° °The clinic staff is available to answer your questions during regular business hours.  Please don’t hesitate to call and ask to speak to one of the nurses for clinical concerns.  If you have a medical emergency, go to the nearest emergency room or call 911.  A surgeon from [facility] is always on call at the hospital. °[address], [address], [place], [place]  [address] ? [address] [address], [place], [place]   [address] °[phone] ? [phone] ? FAX [phone] °Web site:   [email or web address] °

## 2018-11-06 NOTE — Progress Notes (Signed)
1 Day Post-Op   Subjective/Chief Complaint: No pain or nausea, feels great   Objective: Vital signs in last 24 hours: Temp:  [97.7 F (36.5 C)-98.1 F (36.7 C)] 97.9 F (36.6 C) (05/14 0401) Pulse Rate:  [64-84] 70 (05/14 0600) Resp:  [12-31] 23 (05/14 0600) BP: (108-150)/(49-119) 134/119 (05/14 0400) SpO2:  [84 %-98 %] 90 % (05/14 0600) Last BM Date: 11/04/18  Intake/Output from previous day: 05/13 0701 - 05/14 0700 In: 1722.5 [I.V.:1423.7; IV Piggyback:298.8] Out: 1350 [Urine:1250; Blood:100] Intake/Output this shift: No intake/output data recorded.  General appearance: alert and cooperative Resp: clear to auscultation bilaterally Cardio: regular rate and rhythm GI: soft, non-tender; bowel sounds normal; no masses,  no organomegaly. Incisions c/d/i with dermabond Skin: Skin color, texture, turgor normal. No rashes or lesions Neurologic: Grossly normal  Lab Results:  Recent Labs    11/05/18 0246 11/06/18 0249  WBC 5.7 6.5  HGB 11.5* 9.9*  HCT 35.4* 31.4*  PLT 170 161   BMET Recent Labs    11/05/18 0246 11/06/18 0249  NA 139 140  K 3.7 4.4  CL 111 110  CO2 21* 22  GLUCOSE 118* 169*  BUN <5* <5*  CREATININE 0.57 0.55  CALCIUM 8.3* 8.2*   PT/INR Recent Labs    11/05/18 0246 11/06/18 0249  LABPROT 19.6* 16.4*  INR 1.7* 1.3*   ABG No results for input(s): PHART, HCO3 in the last 72 hours.  Invalid input(s): PCO2, PO2  Studies/Results: No results found.  Anti-infectives: Anti-infectives (From admission, onward)   Start     Dose/Rate Route Frequency Ordered Stop   11/05/18 1140  sodium chloride 0.9 % with cefTRIAXone (ROCEPHIN) ADS Med    Note to Pharmacy:  Randa Evens  : cabinet override      11/05/18 1140 11/05/18 1230   11/04/18 1400  cefTRIAXone (ROCEPHIN) 2 g in sodium chloride 0.9 % 100 mL IVPB     2 g 200 mL/hr over 30 Minutes Intravenous Every 24 hours 11/04/18 1133     11/04/18 1400  metroNIDAZOLE (FLAGYL) IVPB 500 mg     500  mg 100 mL/hr over 60 Minutes Intravenous Every 8 hours 11/04/18 1133     11/02/18 0045  piperacillin-tazobactam (ZOSYN) IVPB 3.375 g  Status:  Discontinued     3.375 g 12.5 mL/hr over 240 Minutes Intravenous Every 8 hours 11/02/18 0043 11/04/18 1133   11/01/18 1730  piperacillin-tazobactam (ZOSYN) IVPB 3.375 g     3.375 g 100 mL/hr over 30 Minutes Intravenous  Once 11/01/18 1718 11/01/18 1822      Assessment/Plan: 85yo woman  Afib on coumadin- inr 1.3 today. OK to resume coumadin from surgery standpoint  Cholelithiasis (symptomatic) and likely passed a stone s/p lap chole 11/05/18  Afebrile, LFTs are fine this morning. No pain or nausea, incisions look good. Advance diet, mobilize. She can be discharged when medically cleared.    LOS: 5 days    Clovis Riley 11/06/2018

## 2018-11-06 NOTE — Discharge Summary (Signed)
Physician Discharge Summary  JARYN HOCUTT SPQ:[id number] DOB: Jul 19, 1929 DOA: 11/01/2018  PCP: Levin Erp, MD  Admit date: 11/01/2018 Discharge date: 11/06/2018  Admitted From: Home Disposition: Home  Recommendations for Outpatient Follow-up:  1. Follow up with PCP in 1-2 weeks 2. Please obtain BMP/CBC in one week 3. Please follow up on the following pending results:  Home Health: None Equipment/Devices: None  Discharge Condition: Stable CODE STATUS: Full code Diet recommendation: Regular diet  Subjective: Patient seen and examined.  She states that she feels " fantastic" and a completely different person with no complaints at all.  She also said " I have to remind myself that I had a surgery yesterday, I feel so good today".   Brief/Interim Summary: 85 year old female with known history of atrial fibrillation and multiple orthopedic fractures and hiatal hernia presented to [facility] on 5 9 with temperature tachycardia and tachypnea as well as abdominal pain.  Based on initial work-up in the emergency department, she was diagnosed with acute cholangitis.  She was started on broad-spectrum IV antibiotics. MRCP and ultrasound of the abdomen showed cholelithiasis and possibly choledocholithiasis with no signs of cholecystitis.  She initially had elevated LFTs which have improved ever since giving the impression that she probably has passed the stone.  She has been seen by GI and there are no plans for ERCP. General surgery was then consulted and they planned on laparoscopic cholecystectomy with IOC.  Patient had atrial fibrillation with RVR for which cardiology was consulted and she was managed with IV Cardizem drip and since her INR was elevated so she was given couple of doses of vitamin K.  Once her INR became subtherapeutic at 1.7, patient underwent laparoscopic cholecystectomy with IOC on 11/05/2018.  She is doing very well postoperatively.  She is now tolerating regular diet.   She has been cleared by general surgery as well as cardiology to be discharged and she wants to go home as well.  She will resume all her medications.  For acute cholangitis, I have prescribed her Ceftin 500 mg p.o. twice daily along with Flagyl 500 mg 3 times daily for 7 days as she is allergic to fluoroquinolones.  She also has listed allergy to penicillins however she had received cefazolin here without any allergic reaction.  Discharge Diagnoses:  Active Problems:   GERD (gastroesophageal reflux disease)   Pure hypercholesterolemia   Atrial fibrillation (HCC)   Cholangitis   Sepsis ([place])   Cholelithiasis with choledocholithiasis    Discharge Instructions  Discharge Instructions    Discharge patient   Complete by:  As directed    Discharge patient after lunch if patient tolerates the lunch.   Discharge disposition:  01-Home or Self Care   Discharge patient date:  11/06/2018     Allergies as of 11/06/2018      Reactions   Ciprofloxacin Shortness Of Breath, Rash   Scallops [shellfish Allergy] Anaphylaxis   Apixaban Other (See Comments)   fatigue   Cherry Other (See Comments)   Vertigo   Demerol Other (See Comments)   Patient states it made her crazy and sleep for 12 hours   Doxycycline Nausea And Vomiting   Milk-related Compounds Diarrhea   Penicillins Other (See Comments)   Did it involve swelling of the face/tongue/throat, SOB, or low BP? Unknown Did it involve sudden or severe rash/hives, skin peeling, or any reaction on the inside of your mouth or nose? Unknown Did you need to seek medical attention at a hospital  or doctor's office? Unknown When did it last happen? If all above answers are "NO", may proceed with cephalosporin use.   Rivaroxaban Other (See Comments)   Pt unsure but d/c use   Sudafed [pseudoephedrine Hcl] Other (See Comments)   hyperactive      Medication List    TAKE these medications   cefUROXime 500 MG tablet Commonly known as:   CEFTIN Take 1 tablet (500 mg total) by mouth 2 (two) times daily for 7 days.   diltiazem 30 MG tablet Commonly known as:  Cardizem Take 1 tablet every 4 hours AS NEEDED for AFIB  heart rate >100 0.   fluticasone 50 MCG/ACT nasal spray Commonly known as:  FLONASE Place 1 spray into both nostrils daily as needed for allergies or rhinitis.   ICAPS AREDS 2 PO Take 1 capsule by mouth 2 (two) times a day.   metoprolol tartrate 25 MG tablet Commonly known as:  LOPRESSOR TAKE 1/2 TABLET BY MOUTH 2 TIMES DAILY What changed:  See the new instructions.   metroNIDAZOLE 500 MG tablet Commonly known as:  Flagyl Take 1 tablet (500 mg total) by mouth 3 (three) times daily for 7 days.   multivitamin with minerals Tabs tablet Take 1 tablet by mouth daily.   oxyCODONE-acetaminophen 5-325 MG tablet Commonly known as:  PERCOCET/ROXICET Take 1 tablet by mouth every 6 (six) hours as needed for severe pain.   VITAMIN B-12 PO Take 1 tablet by mouth daily.   warfarin 1 MG tablet Commonly known as:  COUMADIN Take as directed. If you are unsure how to take this medication, talk to your nurse or doctor. Original instructions:  TAKE 1-1 1/2 TABLETS DAILY AS DIRECTED PER COUMADIN CLINIC What changed:    how much to take  how to take this  when to take this  additional instructions      Follow-up Information    [facility], [place] Follow up on 11/20/2018.   Specialty:  General Surgery Why:  Your follow up will be a phone call appointment  (due to the [facility] virus, to limit exposure.) Carlena Hurl will call you at 2:30PM.  Please email a picture if you have an concerns with you wound to: [email or web address]  Contact information: [place] [address] [phone]        Levin Erp, MD Follow up in 1 week(s).   Specialty:  Internal Medicine Contact information: [place] [address] [phone]         Deboraha Sprang, MD .   Specialty:  Cardiology Contact information: [phone] [place] [address] [phone]          Allergies  Allergen Reactions  . Ciprofloxacin Shortness Of Breath and Rash  . Scallops [Shellfish Allergy] Anaphylaxis  . Apixaban Other (See Comments)    fatigue  . Cherry Other (See Comments)    Vertigo  . Demerol Other (See Comments)    Patient states it made her crazy and sleep for 12 hours  . Doxycycline Nausea And Vomiting  . Milk-Related Compounds Diarrhea  . Penicillins Other (See Comments)    Did it involve swelling of the face/tongue/throat, SOB, or low BP? Unknown Did it involve sudden or severe rash/hives, skin peeling, or any reaction on the inside of your mouth or nose? Unknown Did you need to seek medical attention at a hospital or doctor's office? Unknown When did it last happen? If all above  answers are "NO", may proceed with cephalosporin use.   . Rivaroxaban Other (See Comments)    Pt unsure but d/c use  . Sudafed [Pseudoephedrine Hcl] Other (See Comments)    hyperactive    Consultations: Cardiology, GI, general surgery   Procedures/Studies: Dg Chest 2 View  Result Date: 11/01/2018 CLINICAL DATA:  Abdominal pain, nausea, vomiting EXAM: CHEST - 2 VIEW COMPARISON:  01/13/2017 FINDINGS: Cardiomegaly. Both lungs are clear. The visualized skeletal structures are unremarkable. IMPRESSION: Cardiomegaly without acute abnormality of the lungs. Electronically Signed   By: Eddie Candle M.D.   On: 11/01/2018 15:36   US Abdomen Complete  Result Date: 11/01/2018 CLINICAL DATA:  85 year old female with abdominal pain and fever for 1 week. EXAM: ABDOMEN ULTRASOUND COMPLETE COMPARISON:  None. FINDINGS: Gallbladder: Mobile gallstones are identified, the largest measuring 7 mm. There is no evidence of gallbladder wall thickening, pericholecystic fluid or sonographic Murphy sign. Common bile duct: Diameter: 5 mm. No  intrahepatic or extrahepatic biliary dilatation. Liver: 2 homogeneously hyperechoic LEFT hepatic lesions are identified measuring 0.8 cm and 1 cm. These most likely represent hemangiomas in the absence of primary malignancy. A LEFT hepatic cyst is present. Portal vein is patent on color Doppler imaging with normal direction of blood flow towards the liver. IVC: No abnormality visualized. Pancreas: Visualized portion unremarkable. Spleen: Size and appearance within normal limits. Right Kidney: Length: 9.3 cm. Echogenicity within normal limits. No mass or hydronephrosis visualized. Left Kidney: Length: 10.5 cm. Echogenicity within normal limits. No mass or hydronephrosis visualized. Abdominal aorta: No aneurysm visualized. Other findings: None. IMPRESSION: 1. Cholelithiasis without sonographic evidence of acute cholecystitis. No biliary dilatation. 2. Two hyperechoic LEFT hepatic lesions measuring 0.8 cm and 1 cm. These likely represent hemangiomas. If there is history of primary malignancy, consider elective MRI with and without contrast. 3. No other significant abnormalities. Electronically Signed   By: Margarette Canada M.D.   On: 11/01/2018 17:27   Mr 3d Recon At Scanner  Result Date: 11/02/2018 CLINICAL DATA:  Increased abdominal pain for 1 week. EXAM: MRI ABDOMEN WITHOUT AND WITH CONTRAST (INCLUDING MRCP) TECHNIQUE: Multiplanar multisequence MR imaging of the abdomen was performed both before and after the administration of intravenous contrast. Heavily T2-weighted images of the biliary and pancreatic ducts were obtained, and three-dimensional MRCP images were rendered by post processing. CONTRAST:  7 mL Gadavist COMPARISON:  None. FINDINGS: Lower chest:  Lung bases are clear. Hepatobiliary: Several small dependent gallstones. No gallbladder wall thickening or pericholecystic fluid. Common bile duct is normal caliber. No intra or extrahepatic duct dilatation. Several nonenhancing cysts within the liver. Focus of  cortical scarring and retraction lateral RIGHT hepatic lobe probable small hemangioma at this site (image 35/1202) Pancreas: Normal pancreatic parenchymal intensity. No ductal dilatation or inflammation. Spleen: Multiple lesions within the spleen. One dominant round lesion measures 3 cm x 3.3 cm and is hyperintense to splenic tissue ( image 29/9) on T2 weighted imaging. Additional smaller lesions also hyperintense on T2 weighted imaging. Lesions enhance similar splenic tissue with slightly hypo enhancing. Lesions are indeterminate, however comparison chest CT of 11/09/2015 demonstrates no significant change Adrenals/urinary tract: Adrenal glands and kidneys are normal. Stomach/Bowel: Moderate hiatal hernia stomach and limited of the small bowel is unremarkable Vascular/Lymphatic: Abdominal aortic normal caliber. No retroperitoneal periportal lymphadenopathy. Musculoskeletal: No aggressive osseous lesion IMPRESSION: 1. No biliary duct dilatation.  No choledocholithiasis. 2. Gallstones without evidence of cholecystitis. 3. Simple hepatic cysts and probable hemangioma in the RIGHT hepatic lobe. 4. Normal pancreas. 5.  Multiple splenic lesions with one dominant 3 cm lesion are indeterminate. However given no significant change from CT 11/09/2015, favor benign lesions. Electronically Signed   By: Suzy Bouchard M.D.   On: 11/02/2018 04:30   Mr Abdomen Mrcp Moise Boring Contast  Result Date: 11/02/2018 CLINICAL DATA:  Increased abdominal pain for 1 week. EXAM: MRI ABDOMEN WITHOUT AND WITH CONTRAST (INCLUDING MRCP) TECHNIQUE: Multiplanar multisequence MR imaging of the abdomen was performed both before and after the administration of intravenous contrast. Heavily T2-weighted images of the biliary and pancreatic ducts were obtained, and three-dimensional MRCP images were rendered by post processing. CONTRAST:  7 mL Gadavist COMPARISON:  None. FINDINGS: Lower chest:  Lung bases are clear. Hepatobiliary: Several small dependent  gallstones. No gallbladder wall thickening or pericholecystic fluid. Common bile duct is normal caliber. No intra or extrahepatic duct dilatation. Several nonenhancing cysts within the liver. Focus of cortical scarring and retraction lateral RIGHT hepatic lobe probable small hemangioma at this site (image 35/1202) Pancreas: Normal pancreatic parenchymal intensity. No ductal dilatation or inflammation. Spleen: Multiple lesions within the spleen. One dominant round lesion measures 3 cm x 3.3 cm and is hyperintense to splenic tissue ( image 29/9) on T2 weighted imaging. Additional smaller lesions also hyperintense on T2 weighted imaging. Lesions enhance similar splenic tissue with slightly hypo enhancing. Lesions are indeterminate, however comparison chest CT of 11/09/2015 demonstrates no significant change Adrenals/urinary tract: Adrenal glands and kidneys are normal. Stomach/Bowel: Moderate hiatal hernia stomach and limited of the small bowel is unremarkable Vascular/Lymphatic: Abdominal aortic normal caliber. No retroperitoneal periportal lymphadenopathy. Musculoskeletal: No aggressive osseous lesion IMPRESSION: 1. No biliary duct dilatation.  No choledocholithiasis. 2. Gallstones without evidence of cholecystitis. 3. Simple hepatic cysts and probable hemangioma in the RIGHT hepatic lobe. 4. Normal pancreas. 5. Multiple splenic lesions with one dominant 3 cm lesion are indeterminate. However given no significant change from CT 11/09/2015, favor benign lesions. Electronically Signed   By: Suzy Bouchard M.D.   On: 11/02/2018 04:30      Discharge Exam: Vitals:   11/06/18 0800 11/06/18 1037  BP: (!) 144/65 130/67  Pulse: 68 75  Resp: (!) 23   Temp: 98 F (36.7 C)   SpO2: 91%    Vitals:   11/06/18 0500 11/06/18 0600 11/06/18 0800 11/06/18 1037  BP:   (!) 144/65 130/67  Pulse: 70 70 68 75  Resp: (!) 21 (!) 23 (!) 23   Temp:   98 F (36.7 C)   TempSrc:   Oral   SpO2: 91% 90% 91%   Weight:       Height:        General: Pt is alert, awake, not in acute distress Cardiovascular: RRR, S1/S2 +, no rubs, no gallops Respiratory: CTA bilaterally, no wheezing, no rhonchi Abdominal: Soft, NT, ND, bowel sounds + has laparoscopic surgeries incisions Extremities: no edema, no cyanosis    The results of significant diagnostics from this hospitalization (including imaging, microbiology, ancillary and laboratory) are listed below for reference.     Microbiology: Recent Results (from the past 240 hour(s))  SARS Coronavirus 2 (CEPHEID - Performed in [facility] hospital lab), Hosp Order     Status: None   Collection Time: 11/01/18  2:44 PM  Result Value Ref Range Status   SARS Coronavirus 2 NEGATIVE NEGATIVE Final    Comment: (NOTE) If result is NEGATIVE SARS-CoV-2 target nucleic acids are NOT DETECTED. The SARS-CoV-2 RNA is generally detectable in upper and lower  respiratory specimens during the  acute phase of infection. The lowest  concentration of SARS-CoV-2 viral copies this assay can detect is 250  copies / mL. A negative result does not preclude SARS-CoV-2 infection  and should not be used as the sole basis for treatment or other  patient management decisions.  A negative result may occur with  improper specimen collection / handling, submission of specimen other  than nasopharyngeal swab, presence of viral mutation(s) within the  areas targeted by this assay, and inadequate number of viral copies  (<250 copies / mL). A negative result must be combined with clinical  observations, patient history, and epidemiological information. If result is POSITIVE SARS-CoV-2 target nucleic acids are DETECTED. The SARS-CoV-2 RNA is generally detectable in upper and lower  respiratory specimens dur ing the acute phase of infection.  Positive  results are indicative of active infection with SARS-CoV-2.  Clinical  correlation with patient history and other diagnostic information is  necessary  to determine patient infection status.  Positive results do  not rule out bacterial infection or co-infection with other viruses. If result is PRESUMPTIVE POSTIVE SARS-CoV-2 nucleic acids MAY BE PRESENT.   A presumptive positive result was obtained on the submitted specimen  and confirmed on repeat testing.  While 2019 novel coronavirus  (SARS-CoV-2) nucleic acids may be present in the submitted sample  additional confirmatory testing may be necessary for epidemiological  and / or clinical management purposes  to differentiate between  SARS-CoV-2 and other Sarbecovirus currently known to infect humans.  If clinically indicated additional testing with an alternate test  methodology [phone]) is advised. The SARS-CoV-2 RNA is generally  detectable in upper and lower respiratory sp ecimens during the acute  phase of infection. The expected result is Negative. Fact Sheet for Patients:  StrictlyIdeas.no Fact Sheet for Healthcare Providers: BankingDealers.co.za This test is not yet approved or cleared by the [place] FDA and has been authorized for detection and/or diagnosis of SARS-CoV-2 by FDA under an Emergency Use Authorization (EUA).  This EUA will remain in effect (meaning this test can be used) for the duration of the COVID-19 declaration under Section 564(b)(1) of the Act, 21 U.S.C. section 360bbb-3(b)(1), unless the authorization is terminated or revoked sooner. Performed at [facility], [place] [address] [place], [place] [address]   Culture, blood (Routine x 2)     Status: Abnormal   Collection Time: 11/01/18  2:46 PM  Result Value Ref Range Status   Specimen Description   Final    BLOOD LEFT FOREARM Performed at [facility] [address] [place], [place] [address]    Special Requests   Final    BOTTLES DRAWN AEROBIC AND ANAEROBIC Blood Culture adequate volume Performed at [facility] [address] [place],  [address]    Culture  Setup Time   Final    GRAM NEGATIVE RODS IN BOTH AEROBIC AND ANAEROBIC BOTTLES CRITICAL RESULT CALLED TO, READ BACK BY AND VERIFIED WITH: Shelda Jakes PHARMD, AT 7628 11/02/18 BY Rush Landmark Performed at [facility], [place] [address], [place],  [address]    Culture ESCHERICHIA COLI (A)  Final   Report Status 11/04/2018 FINAL  Final   Organism ID, Bacteria ESCHERICHIA COLI  Final      Susceptibility   Escherichia coli - MIC*    AMPICILLIN 4 SENSITIVE Sensitive     CEFAZOLIN <=4 SENSITIVE Sensitive     CEFEPIME <=1 SENSITIVE Sensitive     CEFTAZIDIME <=  1 SENSITIVE Sensitive     CEFTRIAXONE <=1 SENSITIVE Sensitive     CIPROFLOXACIN <=0.25 SENSITIVE Sensitive     GENTAMICIN <=1 SENSITIVE Sensitive     IMIPENEM <=0.25 SENSITIVE Sensitive     TRIMETH/SULFA <=20 SENSITIVE Sensitive     AMPICILLIN/SULBACTAM <=2 SENSITIVE Sensitive     PIP/TAZO <=4 SENSITIVE Sensitive     Extended ESBL NEGATIVE Sensitive     * ESCHERICHIA COLI  Culture, blood (Routine x 2)     Status: Abnormal   Collection Time: 11/01/18  2:46 PM  Result Value Ref Range Status   Specimen Description   Final    BLOOD RIGHT ANTECUBITAL Performed at [facility], [place] [address], [place], [place] [address]    Special Requests   Final    BOTTLES DRAWN AEROBIC AND ANAEROBIC Blood Culture adequate volume Performed at [facility] [address], [place], [place] [address]    Culture  Setup Time   Final    GRAM NEGATIVE RODS IN BOTH AEROBIC AND ANAEROBIC BOTTLES CRITICAL VALUE NOTED.  VALUE IS CONSISTENT WITH PREVIOUSLY REPORTED AND CALLED VALUE.    Culture (A)  Final    ESCHERICHIA COLI SUSCEPTIBILITIES PERFORMED ON PREVIOUS CULTURE WITHIN THE LAST 5 DAYS. Performed at [facility], [place] [address], [place], [place] [address]    Report Status 11/04/2018 FINAL  Final  Blood Culture ID Panel (Reflexed)      Status: Abnormal   Collection Time: 11/01/18  2:46 PM  Result Value Ref Range Status   Enterococcus species NOT DETECTED NOT DETECTED Final   Listeria monocytogenes NOT DETECTED NOT DETECTED Final   Staphylococcus species NOT DETECTED NOT DETECTED Final   Staphylococcus aureus (BCID) NOT DETECTED NOT DETECTED Final   Streptococcus species NOT DETECTED NOT DETECTED Final   Streptococcus agalactiae NOT DETECTED NOT DETECTED Final   Streptococcus pneumoniae NOT DETECTED NOT DETECTED Final   Streptococcus pyogenes NOT DETECTED NOT DETECTED Final   Acinetobacter baumannii NOT DETECTED NOT DETECTED Final   Enterobacteriaceae species DETECTED (A) NOT DETECTED Final    Comment: Enterobacteriaceae represent a large family of gram-negative bacteria, not a single organism. CRITICAL RESULT CALLED TO, READ BACK BY AND VERIFIED WITH: Shelda Jakes PHARMD, AT 0938 11/02/18 BY D. VANHOOK    Enterobacter cloacae complex NOT DETECTED NOT DETECTED Final   Escherichia coli DETECTED (A) NOT DETECTED Final    Comment: CRITICAL RESULT CALLED TO, READ BACK BY AND VERIFIED WITH: Shelda Jakes PHARMD, AT 0740 11/02/18 BY D. VANHOOK    Klebsiella oxytoca NOT DETECTED NOT DETECTED Final   Klebsiella pneumoniae NOT DETECTED NOT DETECTED Final   Proteus species NOT DETECTED NOT DETECTED Final   Serratia marcescens NOT DETECTED NOT DETECTED Final   Carbapenem resistance NOT DETECTED NOT DETECTED Final   Haemophilus influenzae NOT DETECTED NOT DETECTED Final   Neisseria meningitidis NOT DETECTED NOT DETECTED Final   Pseudomonas aeruginosa NOT DETECTED NOT DETECTED Final   Candida albicans NOT DETECTED NOT DETECTED Final   Candida glabrata NOT DETECTED NOT DETECTED Final   Candida krusei NOT DETECTED NOT DETECTED Final   Candida parapsilosis NOT DETECTED NOT DETECTED Final   Candida tropicalis NOT DETECTED NOT DETECTED Final    Comment: Performed at [facility], [place] [address], [place],  [address]  Culture,  blood (single)     Status: None (Preliminary result)   Collection Time: 11/02/18  1:35 PM  Result Value Ref Range Status   Specimen Description  Final    BLOOD RIGHT HAND Performed at [facility], [place] [address], [place], [place] [address]    Special Requests   Final    BOTTLES DRAWN AEROBIC ONLY Blood Culture adequate volume Performed at [facility] [address] [place], [place] [address]    Culture   Final    NO GROWTH 4 DAYS Performed at [facility], [place] [address] [place], [place] [address]    Report Status PENDING  Incomplete  MRSA PCR Screening     Status: None   Collection Time: 11/02/18  4:52 PM  Result Value Ref Range Status   MRSA by PCR NEGATIVE NEGATIVE Final    Comment:        The GeneXpert MRSA Assay (FDA approved for NASAL specimens only), is one component of a comprehensive MRSA colonization surveillance program. It is not intended to diagnose MRSA infection nor to guide or monitor treatment for MRSA infections. Performed at [facility], [place] [address] [place], [place] [address]      Labs: BNP (last 3 results) No results for input(s): BNP in the last 8760 hours. Basic Metabolic Panel: Recent Labs  Lab 11/02/18 0438 11/03/18 0623 11/04/18 0303 11/05/18 0246 11/06/18 0249  NA 139 140 137 139 140  K 3.8 3.3* 3.6 3.7 4.4  CL 110 112* 111 111 110  CO2 19* 20* 19* 21* 22  GLUCOSE 76 81 111* 118* 169*  BUN 9 9 5* <5* <5*  CREATININE 0.56 0.70 0.62 0.57 0.55  CALCIUM 8.2* 7.9* 7.9* 8.3* 8.2*  MG 2.1  --  1.9 2.1 2.0  PHOS 2.4*  --   --   --   --    Liver Function Tests: Recent Labs  Lab 11/02/18 0438 11/03/18 0623 11/04/18 0303 11/05/18 0246 11/06/18 0249  AST 314* 73* 39 30 58*  ALT 510* 265* 195* 149* 123*  ALKPHOS 180* 142* 130* 129* 117  BILITOT 2.5* 1.4* 0.9 0.7 0.5  PROT 5.7* 5.6* 5.4* 6.0* 5.6*  ALBUMIN 3.0* 2.7* 2.7* 2.8* 2.5*   Recent Labs  Lab 11/01/18 1443  11/03/18 0623  LIPASE 26 35   No results for input(s): AMMONIA in the last 168 hours. CBC: Recent Labs  Lab 11/02/18 1343 11/03/18 0623 11/04/18 0303 11/05/18 0246 11/06/18 0249  WBC 8.1 5.4 4.9 5.7 6.5  NEUTROABS 7.1 4.4 3.7 4.0 5.7  HGB 11.4* 10.1* 10.2* 11.5* 9.9*  HCT 35.2* 31.6* 32.0* 35.4* 31.4*  MCV 91.0 92.9 92.0 90.1 92.1  PLT 153 122* 128* 170 161   Cardiac Enzymes: Recent Labs  Lab 11/01/18 2147  TROPONINI <0.03   BNP: Invalid input(s): POCBNP CBG: Recent Labs  Lab 11/02/18 1537  GLUCAP 83   D-Dimer No results for input(s): DDIMER in the last 72 hours. Hgb A1c No results for input(s): HGBA1C in the last 72 hours. Lipid Profile No results for input(s): CHOL, HDL, LDLCALC, TRIG, CHOLHDL, LDLDIRECT in the last 72 hours. Thyroid function studies No results for input(s): TSH, T4TOTAL, T3FREE, THYROIDAB in the last 72 hours.  Invalid input(s): FREET3 Anemia work up No results for input(s): VITAMINB12, FOLATE, FERRITIN, TIBC, IRON, RETICCTPCT in the last 72 hours. Urinalysis    Component Value Date/Time   COLORURINE AMBER (A) 11/01/2018 1428   APPEARANCEUR HAZY (A) 11/01/2018 1428   LABSPEC 1.013 11/01/2018 1428   PHURINE 7.0 11/01/2018 1428   GLUCOSEU NEGATIVE 11/01/2018 1428   HGBUR NEGATIVE 11/01/2018 1428   [place]ger Lee  11/01/2018 1428   BILIRUBINUR neg 08/15/2011 1214   KETONESUR 20 (A) 11/01/2018 1428   PROTEINUR NEGATIVE 11/01/2018 1428   UROBILINOGEN 0.2 09/23/2012 1628   NITRITE NEGATIVE 11/01/2018 1428   LEUKOCYTESUR LARGE (A) 11/01/2018 1428   Sepsis Labs Invalid input(s): PROCALCITONIN,  WBC,  LACTICIDVEN Microbiology Recent Results (from the past 240 hour(s))  SARS Coronavirus 2 (CEPHEID - Performed in [facility] hospital lab), Hosp Order     Status: None   Collection Time: 11/01/18  2:44 PM  Result Value Ref Range Status   SARS Coronavirus 2 NEGATIVE NEGATIVE Final    Comment: (NOTE) If result is NEGATIVE SARS-CoV-2  target nucleic acids are NOT DETECTED. The SARS-CoV-2 RNA is generally detectable in upper and lower  respiratory specimens during the acute phase of infection. The lowest  concentration of SARS-CoV-2 viral copies this assay can detect is 250  copies / mL. A negative result does not preclude SARS-CoV-2 infection  and should not be used as the sole basis for treatment or other  patient management decisions.  A negative result may occur with  improper specimen collection / handling, submission of specimen other  than nasopharyngeal swab, presence of viral mutation(s) within the  areas targeted by this assay, and inadequate number of viral copies  (<250 copies / mL). A negative result must be combined with clinical  observations, patient history, and epidemiological information. If result is POSITIVE SARS-CoV-2 target nucleic acids are DETECTED. The SARS-CoV-2 RNA is generally detectable in upper and lower  respiratory specimens dur ing the acute phase of infection.  Positive  results are indicative of active infection with SARS-CoV-2.  Clinical  correlation with patient history and other diagnostic information is  necessary to determine patient infection status.  Positive results do  not rule out bacterial infection or co-infection with other viruses. If result is PRESUMPTIVE POSTIVE SARS-CoV-2 nucleic acids MAY BE PRESENT.   A presumptive positive result was obtained on the submitted specimen  and confirmed on repeat testing.  While 2019 novel coronavirus  (SARS-CoV-2) nucleic acids may be present in the submitted sample  additional confirmatory testing may be necessary for epidemiological  and / or clinical management purposes  to differentiate between  SARS-CoV-2 and other Sarbecovirus currently known to infect humans.  If clinically indicated additional testing with an alternate test  methodology [id number]) is advised. The SARS-CoV-2 RNA is generally  detectable in upper and lower  respiratory sp ecimens during the acute  phase of infection. The expected result is Negative. Fact Sheet for Patients:  StrictlyIdeas.no Fact Sheet for Healthcare Providers: BankingDealers.co.za This test is not yet approved or cleared by the [place] FDA and has been authorized for detection and/or diagnosis of SARS-CoV-2 by FDA under an Emergency Use Authorization (EUA).  This EUA will remain in effect (meaning this test can be used) for the duration of the COVID-19 declaration under Section 564(b)(1) of the Act, 21 U.S.C. section 360bbb-3(b)(1), unless the authorization is terminated or revoked sooner. Performed at [facility], [place] [address], [place], [place] [address]   Culture, blood (Routine x 2)     Status: Abnormal   Collection Time: 11/01/18  2:46 PM  Result Value Ref Range Status   Specimen Description   Final    BLOOD LEFT FOREARM Performed at [place] [address], [place],  [address]    Special Requests   Final    BOTTLES DRAWN AEROBIC AND ANAEROBIC Blood Culture adequate volume Performed  at [facility], [place] [address] [place], [place] [address]    Culture  Setup Time   Final    GRAM NEGATIVE RODS IN BOTH AEROBIC AND ANAEROBIC BOTTLES CRITICAL RESULT CALLED TO, READ BACK BY AND VERIFIED WITH: Shelda Jakes PHARMD, AT 3151 11/02/18 BY Rush Landmark Performed at [facility], [place] [address] [place], [place] [address]    Culture ESCHERICHIA COLI (A)  Final   Report Status 11/04/2018 FINAL  Final   Organism ID, Bacteria ESCHERICHIA COLI  Final      Susceptibility   Escherichia coli - MIC*    AMPICILLIN 4 SENSITIVE Sensitive     CEFAZOLIN <=4 SENSITIVE Sensitive     CEFEPIME <=1 SENSITIVE Sensitive     CEFTAZIDIME <=1 SENSITIVE Sensitive     CEFTRIAXONE <=1 SENSITIVE Sensitive     CIPROFLOXACIN <=0.25 SENSITIVE Sensitive     GENTAMICIN <=1  SENSITIVE Sensitive     IMIPENEM <=0.25 SENSITIVE Sensitive     TRIMETH/SULFA <=20 SENSITIVE Sensitive     AMPICILLIN/SULBACTAM <=2 SENSITIVE Sensitive     PIP/TAZO <=4 SENSITIVE Sensitive     Extended ESBL NEGATIVE Sensitive     * ESCHERICHIA COLI  Culture, blood (Routine x 2)     Status: Abnormal   Collection Time: 11/01/18  2:46 PM  Result Value Ref Range Status   Specimen Description   Final    BLOOD RIGHT ANTECUBITAL Performed at [facility], [place] [address] [place], [place] [address]    Special Requests   Final    BOTTLES DRAWN AEROBIC AND ANAEROBIC Blood Culture adequate volume Performed at [place] [address] [place], [place] [address]    Culture  Setup Time   Final    GRAM NEGATIVE RODS IN BOTH AEROBIC AND ANAEROBIC BOTTLES CRITICAL VALUE NOTED.  VALUE IS CONSISTENT WITH PREVIOUSLY REPORTED AND CALLED VALUE.    Culture (A)  Final    ESCHERICHIA COLI SUSCEPTIBILITIES PERFORMED ON PREVIOUS CULTURE WITHIN THE LAST 5 DAYS. Performed at [facility], [place] [address] [place], [place] [address]    Report Status 11/04/2018 FINAL  Final  Blood Culture ID Panel (Reflexed)     Status: Abnormal   Collection Time: 11/01/18  2:46 PM  Result Value Ref Range Status   Enterococcus species NOT DETECTED NOT DETECTED Final   Listeria monocytogenes NOT DETECTED NOT DETECTED Final   Staphylococcus species NOT DETECTED NOT DETECTED Final   Staphylococcus aureus (BCID) NOT DETECTED NOT DETECTED Final   Streptococcus species NOT DETECTED NOT DETECTED Final   Streptococcus agalactiae NOT DETECTED NOT DETECTED Final   Streptococcus pneumoniae NOT DETECTED NOT DETECTED Final   Streptococcus pyogenes NOT DETECTED NOT DETECTED Final   Acinetobacter baumannii NOT DETECTED NOT DETECTED Final   Enterobacteriaceae species DETECTED (A) NOT DETECTED Final    Comment: Enterobacteriaceae represent a large family of gram-negative bacteria, not a single  organism. CRITICAL RESULT CALLED TO, READ BACK BY AND VERIFIED WITH: Shelda Jakes PHARMD, AT 7035 11/02/18 BY D. VANHOOK    Enterobacter cloacae complex NOT DETECTED NOT DETECTED Final   Escherichia coli DETECTED (A) NOT DETECTED Final    Comment: CRITICAL RESULT CALLED TO, READ BACK BY AND VERIFIED WITH: Shelda Jakes PHARMD, AT 0093 11/02/18 BY D. VANHOOK    Klebsiella oxytoca NOT DETECTED NOT DETECTED Final   Klebsiella pneumoniae NOT DETECTED NOT DETECTED Final   Proteus species NOT DETECTED NOT DETECTED Final   Serratia marcescens NOT  DETECTED NOT DETECTED Final   Carbapenem resistance NOT DETECTED NOT DETECTED Final   Haemophilus influenzae NOT DETECTED NOT DETECTED Final   Neisseria meningitidis NOT DETECTED NOT DETECTED Final   Pseudomonas aeruginosa NOT DETECTED NOT DETECTED Final   Candida albicans NOT DETECTED NOT DETECTED Final   Candida glabrata NOT DETECTED NOT DETECTED Final   Candida krusei NOT DETECTED NOT DETECTED Final   Candida parapsilosis NOT DETECTED NOT DETECTED Final   Candida tropicalis NOT DETECTED NOT DETECTED Final    Comment: Performed at [facility], [place] [address] [place], [place] [address]  Culture, blood (single)     Status: None (Preliminary result)   Collection Time: 11/02/18  1:35 PM  Result Value Ref Range Status   Specimen Description   Final    BLOOD RIGHT HAND Performed at [facility], [place] [address] [place], [place] [address]    Special Requests   Final    BOTTLES DRAWN AEROBIC ONLY Blood Culture adequate volume Performed at [place] [address] [place], [place] [address]    Culture   Final    NO GROWTH 4 DAYS Performed at [facility], [place] [address] [place], [place] [address]    Report Status PENDING  Incomplete  MRSA PCR Screening     Status: None   Collection Time: 11/02/18  4:52 PM  Result Value Ref Range Status   MRSA by PCR NEGATIVE NEGATIVE Final    Comment:        The GeneXpert  MRSA Assay (FDA approved for NASAL specimens only), is one component of a comprehensive MRSA colonization surveillance program. It is not intended to diagnose MRSA infection nor to guide or monitor treatment for MRSA infections. Performed at [facility], [place] [address], [place], [place] [address]      Time coordinating discharge: 45 minutes  SIGNED:   Darliss Cheney, MD  [facility] 11/06/2018, 11:50 AM Pager [phone]  If 7PM-7AM, please contact night-coverage [email or web address] Password [email or web address]

## 2018-11-07 ENCOUNTER — Other Ambulatory Visit: Payer: Self-pay

## 2018-11-07 ENCOUNTER — Emergency Department (HOSPITAL_COMMUNITY)
Admission: EM | Admit: 2018-11-07 | Discharge: 2018-11-07 | Disposition: A | Discharge status: Home / Self Care | Payer: PPO | Attending: Emergency Medicine | Admitting: Emergency Medicine

## 2018-11-07 ENCOUNTER — Encounter (HOSPITAL_COMMUNITY): Payer: Self-pay

## 2018-11-07 DIAGNOSIS — R319 Hematuria, unspecified: Secondary | ICD-10-CM | POA: Diagnosis not present

## 2018-11-07 DIAGNOSIS — Z7901 Long term (current) use of anticoagulants: Secondary | ICD-10-CM | POA: Diagnosis not present

## 2018-11-07 DIAGNOSIS — Z79899 Other long term (current) drug therapy: Secondary | ICD-10-CM | POA: Insufficient documentation

## 2018-11-07 DIAGNOSIS — R197 Diarrhea, unspecified: Secondary | ICD-10-CM | POA: Insufficient documentation

## 2018-11-07 LAB — COMPREHENSIVE METABOLIC PANEL
ALT: 145 U/L — ABNORMAL HIGH (ref 0–44)
AST: 125 U/L — ABNORMAL HIGH (ref 15–41)
Albumin: 3.2 g/dL — ABNORMAL LOW (ref 3.5–5.0)
Alkaline Phosphatase: 129 U/L — ABNORMAL HIGH (ref 38–126)
Anion gap: 10 (ref 5–15)
BUN: 5 mg/dL — ABNORMAL LOW (ref 8–23)
CO2: 27 mmol/L (ref 22–32)
Calcium: 8.7 mg/dL — ABNORMAL LOW (ref 8.9–10.3)
Chloride: 105 mmol/L (ref 98–111)
Creatinine, Ser: 0.67 mg/dL (ref 0.44–1.00)
GFR calc Af Amer: >60 mL/min (ref >60)
GFR calc non Af Amer: >60 mL/min (ref >60)
Glucose, Bld: 76 mg/dL (ref 70–99)
Potassium: 3.2 mmol/L — ABNORMAL LOW (ref 3.5–5.1)
Sodium: 142 mmol/L (ref 135–145)
Total Bilirubin: 1.3 mg/dL — ABNORMAL HIGH (ref 0.3–1.2)
Total Protein: 6.4 g/dL — ABNORMAL LOW (ref 6.5–8.1)

## 2018-11-07 LAB — CBC
HCT: 35.3 % — ABNORMAL LOW (ref 36.0–46.0)
Hemoglobin: 10.8 g/dL — ABNORMAL LOW (ref 12.0–15.0)
MCH: 28.1 pg (ref 26.0–34.0)
MCHC: 30.6 g/dL (ref 30.0–36.0)
MCV: 91.9 fL (ref 80.0–100.0)
Platelets: 232 10*3/uL (ref 150–400)
RBC: 3.84 MIL/uL — ABNORMAL LOW (ref 3.87–5.11)
RDW: 14.8 % (ref 11.5–15.5)
WBC: 7.6 10*3/uL (ref 4.0–10.5)
nRBC: 0 % (ref 0.0–0.2)

## 2018-11-07 LAB — URINALYSIS, ROUTINE W REFLEX MICROSCOPIC
Bacteria, UA: NONE SEEN
Bilirubin Urine: NEGATIVE
Glucose, UA: NEGATIVE mg/dL
Ketones, ur: 5 mg/dL — AB
Nitrite: NEGATIVE
Protein, ur: NEGATIVE mg/dL
Specific Gravity, Urine: 1.005 (ref 1.005–1.030)
pH: 6 (ref 5.0–8.0)

## 2018-11-07 LAB — LIPASE, BLOOD: Lipase: 47 U/L (ref 11–51)

## 2018-11-07 LAB — CULTURE, BLOOD (SINGLE)
Culture: NO GROWTH
Special Requests: ADEQUATE

## 2018-11-07 MED ORDER — SODIUM CHLORIDE 0.9 % IV SOLN
INTRAVENOUS | Status: DC
  Administered 2018-11-07: 13:00:00 via INTRAVENOUS

## 2018-11-07 NOTE — Discharge Instructions (Addendum)
Taking the antibiotics as we discussed, advance your diet as tolerated, follow-up with your surgeons as planned

## 2018-11-07 NOTE — ED Provider Notes (Signed)
[facility] DEPT Provider Note   CSN: [id number] Arrival date & time: 11/07/18  1201    History   Chief Complaint Chief Complaint  Patient presents with  . Hematuria    after recent gall bladder sx  . Diarrhea    HPI Samantha Foley is a 85 y.o. female.     HPI Patient presents to the emergency room for evaluation of blood in her urine associated with some loose stools after recent gallbladder surgery.  Patient was admitted to the hospital this past week.  She ended up having a cholecystectomy on May 13.  Patient continued to improve and she was discharged yesterday.  Patient has had some loose stools since her discharge.  She is also had some intermittent pains in her right abdomen but otherwise she has been feeling well.  She denies having any nausea or vomiting.  She has not had a lot of solid foods but again has not had any vomiting.  Patient is post be taking antibiotics but because she has not transition to solid foods yet she has not started taking them yet.  Today the patient started noticing some blood in the commode when she was urinating.  She called the surgeon on call and was instructed to come to the emergency room. Past Medical History:  Diagnosis Date  . Arthritis   . Atrial fibrillation ([place])    reported but not yet documented  . Chronic back pain    Dr. Tonita Cong  . Chronic knee pain    Dr. Rhona Raider  . Colon polyp    Dr. Wynetta Emery  . Dysrhythmia    atrial fib- mild per pt- no meds  . Food allergy    Scallops--no other seafood allergies  . GERD (gastroesophageal reflux disease)   . Hiatal hernia   . Hyperlipidemia   . Impaired glucose tolerance   . Lactose intolerance   . Osteopenia   . PVC's (premature ventricular contractions)   . Seasonal allergies   . Vision problem    wears contacts    Patient Active Problem List   Diagnosis Date Noted  . Cholelithiasis with choledocholithiasis 11/06/2018  . Cholangitis 11/01/2018   . Sepsis ([place]) 11/01/2018  . Encounter for therapeutic drug monitoring 04/15/2017  . S/P total hysterectomy - Davinci 03/09/2016  . Postoperative state 03/08/2016  . Age-related macular degeneration 06/28/2015  . Disorder of rotator cuff 06/28/2015  . Degenerative drusen 07/11/2014  . Chest pain 12/16/2013  . Shoulder injury 12/18/2012  . Lipoma of neck 02/27/2012  . GERD (gastroesophageal reflux disease) 08/08/2011  . Impaired fasting glucose 08/08/2011  . Pure hypercholesterolemia 08/08/2011  . Osteopenia 08/08/2011  . Atrial fibrillation ([place]) 08/08/2011    Past Surgical History:  Procedure Laterality Date  . BACK SURGERY     several vertbea ae rcemntekd  . CHOLECYSTECTOMY N/A 11/05/2018   Procedure: LAPAROSCOPIC CHOLECYSTECTOMY;  Surgeon: Clovis Riley, MD;  Location: [facility];  Service: General;  Laterality: N/A;  . ROBOTIC ASSISTED TOTAL HYSTERECTOMY WITH BILATERAL SALPINGO OOPHERECTOMY Bilateral 03/08/2016   Procedure: ROBOTIC ASSISTED TOTAL HYSTERECTOMY WITH BILATERAL SALPINGO OOPHORECTOMY/Uterosacral Ligament Suspension;  Surgeon: Princess Bruins, MD;  Location: [facility];  Service: Gynecology;  Laterality: Bilateral;  . sebaceous cyst excision  2009   neck  . TONSILLECTOMY AND ADENOIDECTOMY  age 62  . VERTEBROPLASTY  05/2009  . WISDOM TOOTH EXTRACTION       OB History   No obstetric history on file.      Home  Medications    Prior to Admission medications   Medication Sig Start Date End Date Taking? Authorizing Provider  cefUROXime (CEFTIN) 500 MG tablet Take 1 tablet (500 mg total) by mouth 2 (two) times daily for 7 days. 11/06/18 11/13/18  Darliss Cheney, MD  Cyanocobalamin (VITAMIN B-12 PO) Take 1 tablet by mouth daily.    [provider]  diltiazem (CARDIZEM) 30 MG tablet Take 1 tablet every 4 hours AS NEEDED for AFIB  heart rate >100 0. Patient not taking: Reported on 05/29/2018 08/01/17   Sherran Needs, NP  fluticasone [facility]) 50 MCG/ACT nasal spray  Place 1 spray into both nostrils daily as needed for allergies or rhinitis.    [provider]  metoprolol tartrate (LOPRESSOR) 25 MG tablet TAKE 1/2 TABLET BY MOUTH 2 TIMES DAILY Patient taking differently: Take 12.5 mg by mouth 2 (two) times daily.  07/21/18   Sherran Needs, NP  metroNIDAZOLE (FLAGYL) 500 MG tablet Take 1 tablet (500 mg total) by mouth 3 (three) times daily for 7 days. 11/06/18 11/13/18  Darliss Cheney, MD  Multiple Vitamin (MULTIVITAMIN WITH MINERALS) TABS tablet Take 1 tablet by mouth daily.    [provider]  Multiple Vitamins-Minerals (ICAPS AREDS 2 PO) Take 1 capsule by mouth 2 (two) times a day.    [provider]  oxyCODONE-acetaminophen (PERCOCET/ROXICET) 5-325 MG tablet Take 1 tablet by mouth every 6 (six) hours as needed for severe pain. Patient not taking: Reported on 11/01/2018 04/21/17   Langston Masker B, PA-C  warfarin (COUMADIN) 1 MG tablet TAKE 1-1 1/2 TABLETS DAILY AS DIRECTED PER COUMADIN CLINIC Patient taking differently: Take 1-1.5 mg by mouth See admin instructions. 1.5 mg on Monday and fridays, all other days is 1 mg 09/01/18   Deboraha Sprang, MD    Family History Family History  Problem Relation Age of Onset  . Rheumatic fever Mother   . Diabetes Mother   . Heart disease Mother        rheumatic heart disease  . Cancer Sister 52       breast cancer  . Cancer Brother        bladder and prostate cancer  . Migraines Daughter   . Cancer Sister        leukemia at 22; colon cancer in her late 30's--family ?'s dx    Social History Social History   Tobacco Use  . Smoking status: Never Smoker  . Smokeless tobacco: Never Used  Substance Use Topics  . Alcohol use: No    Comment: maybe one glass per year.  . Drug use: No     Allergies   Ciprofloxacin; Scallops [shellfish allergy]; Apixaban; Cherry; Demerol; Doxycycline; Milk-related compounds; Penicillins; Rivaroxaban; and Sudafed [pseudoephedrine hcl]   Review of  Systems Review of Systems  All other systems reviewed and are negative.    Physical Exam Updated Vital Signs BP (!) 164/69   Pulse 76   Temp 97.6 F (36.4 C) (Oral)   Resp (!) 22   Ht 1.575 m (5\' 2" )   Wt 70.3 kg   SpO2 98%   BMI 28.35 kg/m   Physical Exam Vitals signs and nursing note reviewed.  Constitutional:      General: She is not in acute distress.    Appearance: She is well-developed.  HENT:     Head: Normocephalic and atraumatic.     Right Ear: External ear normal.     Left Ear: External ear normal.  Eyes:  General: No scleral icterus.       Right eye: No discharge.        Left eye: No discharge.     Conjunctiva/sclera: Conjunctivae normal.  Neck:     Musculoskeletal: Neck supple.     Trachea: No tracheal deviation.  Cardiovascular:     Rate and Rhythm: Normal rate and regular rhythm.  Pulmonary:     Effort: Pulmonary effort is normal. No respiratory distress.     Breath sounds: Normal breath sounds. No stridor. No wheezing or rales.  Abdominal:     General: Bowel sounds are normal. There is no distension.     Palpations: Abdomen is soft.     Tenderness: There is no abdominal tenderness. There is no guarding or rebound.     Comments: Abdomen is soft, no tenderness, healing surgical wounds without signs of erythema or drainage  Genitourinary:    Comments: Small external hemorrhoid, no blood noted Musculoskeletal:        General: No tenderness.  Skin:    General: Skin is warm and dry.     Findings: No rash.  Neurological:     Mental Status: She is alert.     Cranial Nerves: No cranial nerve deficit (no facial droop, extraocular movements intact, no slurred speech).     Sensory: No sensory deficit.     Motor: No abnormal muscle tone or seizure activity.     Coordination: Coordination normal.      ED Treatments / Results  Labs (all labs ordered are listed, but only abnormal results are displayed) Labs Reviewed  CBC - Abnormal; Notable for the  following components:      Result Value   RBC 3.84 (*)    Hemoglobin 10.8 (*)    HCT 35.3 (*)    All other components within normal limits  COMPREHENSIVE METABOLIC PANEL - Abnormal; Notable for the following components:   Potassium 3.2 (*)    BUN 5 (*)    Calcium 8.7 (*)    Total Protein 6.4 (*)    Albumin 3.2 (*)    AST 125 (*)    ALT 145 (*)    Alkaline Phosphatase 129 (*)    Total Bilirubin 1.3 (*)    All other components within normal limits  URINALYSIS, ROUTINE W REFLEX MICROSCOPIC - Abnormal; Notable for the following components:   APPearance HAZY (*)    Hgb urine dipstick SMALL (*)    Ketones, ur 5 (*)    Leukocytes,Ua MODERATE (*)    All other components within normal limits  LIPASE, BLOOD    Procedures Procedures (including critical care time)  Medications Ordered in ED Medications  0.9 %  sodium chloride infusion ( Intravenous New Bag/Given 11/07/18 1258)     Initial Impression / Assessment and Plan / ED Course  I have reviewed the triage vital signs and the nursing notes.  Pertinent labs & imaging results that were available during my care of the patient were reviewed by me and considered in my medical decision making (see chart for details).  Clinical Course as of Nov 07 1507  Fri Nov 07, 2018  1429 Comprehensive metabolic panel(!) [JK]  7035 LFTs and bili increased since last    [JK]    Clinical Course User Index [JK] Dorie Rank, MD     Patient presented to the emergency room for evaluation of some blood in her urine after recent surgery.  In the ED the patient does not have any gross hematuria.  Her urine is clear in color.  Laboratory tests are otherwise reassuring.  She does have slight increase in her LFTs and bilirubin but I do not think this is concerning.  Patient afebrile and feels well.  No evidence of rectal bleeding.  NO gross hematuria.  Pt is stable for dc.  She does have abx rx that she has not started yet.  Encouraged pt to start taking  that medication  Final Clinical Impressions(s) / ED Diagnoses   Final diagnoses:  Hematuria, unspecified type    ED Discharge Orders    None       Dorie Rank, MD 11/07/18 1510

## 2018-11-07 NOTE — ED Notes (Signed)
Pure wick has been placed. Pt has been educated on pure wick. Suction set to 45mmHg 

## 2018-11-07 NOTE — ED Notes (Signed)
Bed: IN86 Expected date:  Expected time:  Means of arrival:  Comments: EMS-hematuria/loose stool

## 2018-11-07 NOTE — ED Triage Notes (Signed)
Pt got home from hospital yesterday after having gallbladder removal. Pt reports seeing blood clots in her urine, and had several episodes of diarrhea. Pt called her surgeon, who told her to come to the ED.

## 2018-11-12 ENCOUNTER — Telehealth: Payer: Self-pay

## 2018-11-12 ENCOUNTER — Telehealth: Payer: Self-pay | Admitting: Internal Medicine

## 2018-11-12 NOTE — Telephone Encounter (Signed)
New message   Patient would like to know if there are any alternative medication (coumadin) so that she won't have to have coumadin checked every few weeks? Please advise.

## 2018-11-12 NOTE — Telephone Encounter (Signed)

## 2018-11-13 ENCOUNTER — Ambulatory Visit (INDEPENDENT_AMBULATORY_CARE_PROVIDER_SITE_OTHER): Payer: PPO | Admitting: Pharmacist

## 2018-11-13 DIAGNOSIS — I4891 Unspecified atrial fibrillation: Secondary | ICD-10-CM

## 2018-11-13 DIAGNOSIS — Z5181 Encounter for therapeutic drug level monitoring: Secondary | ICD-10-CM | POA: Diagnosis not present

## 2018-11-13 DIAGNOSIS — I480 Paroxysmal atrial fibrillation: Secondary | ICD-10-CM

## 2018-11-13 LAB — POCT INR: INR: 1.3 — AB (ref 2.0–3.0)

## 2018-11-13 MED ORDER — APIXABAN 5 MG PO TABS: 5.0000 mg | ORAL_TABLET | Freq: Two times a day (BID) | ORAL | 5 refills | Status: DC

## 2018-11-13 NOTE — Telephone Encounter (Signed)
Spoke with pt today regarding her request to switch from coumadin to a NOAC. I gave her the name of the 3 most common (Eliquis, Xarelto, Pradaxa.) She states she believes she tried on some time ago but cannot remember what caused her to switch back.   She is coming in for an INR check today. I advised her to mention this to the nurse who helps her and I will forward this message to anticoagulation clinic so that they may contact her back to discuss further.   She verbalized understanding and had no additional questions.

## 2018-11-13 NOTE — Telephone Encounter (Signed)
Reviewed patient chart. It appears she was on both xarelto and Eliquis in the past. Xarelto stopped due to leg cramps. Eliquis changed to warfarin due to fatigue/dizziness. Was also having several episodes of afib around the same time. Called patient and reviewed this with patient. She states that she remembers now.  Reviewed anticoagulation options, Savasya and Pradaxa not the best alternatives. Patient would like to try Eliquis again. Told patient we could also try getting her a home meter. Her concern is about being able to drive to her appointments. Patient states she would like to retry the Eliquis, but she is glad to know there is an alternative if she needs to go back to coumadin. Patient will come for INR check today through the drive thru. Will give further instructions on how to switch after this. I have sent Rx for Eliquis 5mg  BID to pharmacy.

## 2018-11-14 ENCOUNTER — Other Ambulatory Visit: Payer: Self-pay

## 2018-11-18 DIAGNOSIS — K8020 Calculus of gallbladder without cholecystitis without obstruction: Secondary | ICD-10-CM | POA: Diagnosis not present

## 2018-11-18 DIAGNOSIS — I4891 Unspecified atrial fibrillation: Secondary | ICD-10-CM | POA: Diagnosis not present

## 2018-11-18 DIAGNOSIS — K810 Acute cholecystitis: Secondary | ICD-10-CM | POA: Diagnosis not present

## 2018-11-18 DIAGNOSIS — R799 Abnormal finding of blood chemistry, unspecified: Secondary | ICD-10-CM | POA: Diagnosis not present

## 2018-12-24 DIAGNOSIS — M25511 Pain in right shoulder: Secondary | ICD-10-CM | POA: Diagnosis not present

## 2019-01-01 DIAGNOSIS — M25562 Pain in left knee: Secondary | ICD-10-CM | POA: Diagnosis not present

## 2019-01-01 DIAGNOSIS — M1712 Unilateral primary osteoarthritis, left knee: Secondary | ICD-10-CM | POA: Diagnosis not present

## 2019-02-01 ENCOUNTER — Other Ambulatory Visit (HOSPITAL_COMMUNITY): Payer: Self-pay | Admitting: Nurse Practitioner

## 2019-02-20 DIAGNOSIS — M25511 Pain in right shoulder: Secondary | ICD-10-CM | POA: Diagnosis not present

## 2019-03-09 DIAGNOSIS — L749 Eccrine sweat disorder, unspecified: Secondary | ICD-10-CM | POA: Diagnosis not present

## 2019-03-09 DIAGNOSIS — H538 Other visual disturbances: Secondary | ICD-10-CM | POA: Diagnosis not present

## 2019-03-09 DIAGNOSIS — R531 Weakness: Secondary | ICD-10-CM | POA: Diagnosis not present

## 2019-03-09 DIAGNOSIS — R5381 Other malaise: Secondary | ICD-10-CM | POA: Diagnosis not present

## 2019-05-06 ENCOUNTER — Other Ambulatory Visit: Payer: Self-pay | Admitting: *Deleted

## 2019-05-06 MED ORDER — APIXABAN 5 MG PO TABS: ORAL_TABLET | ORAL | 1 refills | Status: DC

## 2019-05-06 NOTE — Telephone Encounter (Signed)
Eliquis 5mg  refill request received, pt is 85yrs old, weight-70.3kg, Crea-0.67 on 11/07/2018, Diagnosis-Afib, and last seen by Roderic Palau on 05/29/2018 & was due to see Caryl Comes in August 2020. Called pt to remind her that she needs to schedule an appt and she verbalized understanding. She was given the main number to call as she needs her dtr to assist with appts scheduling. Dose is appropriate based on dosing criteria. Will send in refill to requested pharmacy.

## 2019-05-29 ENCOUNTER — Telehealth: Payer: Self-pay | Admitting: Internal Medicine

## 2019-05-29 NOTE — Telephone Encounter (Signed)
Called pt and gave recommendations to increase her metoprolol to 25mg  at night. She agrees to check her blood pressure when taking her extra dose.   She has a virtual visit scheduled for 12/10.  She had no additional questions.

## 2019-05-29 NOTE — Telephone Encounter (Signed)
Pt states that last night when she went to bed she began experiencing tachycardia. Pt states her higher HR throughout the night was 110 bpm but often times it would drop down to 60 bpm. Pt currently takes 1/2 her Metoprolol in the AM and the other half in the evening. Pt did experience some SOB when this occurred. She says she normally gets SOB when she has episodes of tachycardia but otherwise has no other symptoms.   Pt is wondering if there is something she can do to prevent this from happening as it causes her to loose sleep. She would like to know if a higher dose of Metoprolol would be appropriate. Will route to Dr. Caryl Comes for advisement.

## 2019-05-29 NOTE — Telephone Encounter (Signed)
This is the refill pool. Please send to right pool. Thanks

## 2019-05-29 NOTE — Telephone Encounter (Signed)
Per pt call tachycardia started last night and she took a Metoprolol 25 mg and another this morning at 10 am.   Pt would like a call back on what she should do when this happens pt stated her heart rate is normal at present.      Please give her a call back.

## 2019-05-29 NOTE — Telephone Encounter (Signed)
Would recommend increasing PM dose of metoprolol to 25 mg. Keep AM dose of metoprolol at 12.5 mg.

## 2019-05-29 NOTE — Telephone Encounter (Signed)
Called and spoke with pt   Increasingly frequent episodes of tachypalpitations   Agreed that she could take an extra 12.5 metoprolol at night if necessary   Has had propensity to low BP  She says telehealth visit  Scheduled with me next week

## 2019-06-04 ENCOUNTER — Other Ambulatory Visit: Payer: Self-pay

## 2019-06-04 ENCOUNTER — Telehealth: Payer: PPO | Admitting: Internal Medicine

## 2019-07-01 ENCOUNTER — Other Ambulatory Visit: Payer: Self-pay | Admitting: Internal Medicine

## 2019-07-01 NOTE — Telephone Encounter (Signed)
Eliquis 5mg  refill request received, pt is 86 yrs old, weight-70.3kg, Crea-0.67 on 11/07/2018, Diagnosis-Afib, and last seen by Roderic Palau on 05/29/2018 and has a pending appt on 07/02/2019 with Dr. Lequita Halt. Dose is appropriate based on dosing criteria. Will send in refill to requested pharmacy.

## 2019-07-02 ENCOUNTER — Telehealth (INDEPENDENT_AMBULATORY_CARE_PROVIDER_SITE_OTHER): Payer: Medicare HMO | Admitting: Internal Medicine

## 2019-07-02 ENCOUNTER — Other Ambulatory Visit: Payer: Self-pay

## 2019-07-02 VITALS — HR 72 | Ht 62.0 in | Wt 149.0 lb

## 2019-07-02 DIAGNOSIS — I480 Paroxysmal atrial fibrillation: Secondary | ICD-10-CM

## 2019-07-02 NOTE — Progress Notes (Signed)
Electrophysiology TeleHealth Note   Due to national recommendations of social distancing due to COVID 19, an audio/video telehealth visit is felt to be most appropriate for this patient at this time.  See MyChart message from today for the patient's consent to telehealth for [facility].   Date:  07/02/2019   ID:  Samantha Foley, DOB Feb 02, 1930, MRN [id number]  Location: patient's home  Provider location: [address], [place]  Evaluation Performed: Follow-up visit  PCP:  Levin Erp, MD  Cardiologist:    * Electrophysiologist:  SK   Chief Complaint:    History of Present Illness:    Samantha Foley is a 86 y.o. female who presents via audio/video conferencing for a telehealth visit today.  Since last being seen in our clinic for atrial fib  the patient reports doing relatively well  Mild SOB and trace edema   She has intermittent tachypalpitations assoc with some LH, also severely limited by knee and shoulder pain.  Pain meds limited  Struggling with sleep     The patient denies symptoms of fevers, chills, cough, or new SOB worrisome for COVID 19.    Past Medical History:  Diagnosis Date  . Arthritis   . Atrial fibrillation ([facility])    reported but not yet documented  . Chronic back pain    Dr. Tonita Cong  . Chronic knee pain    Dr. Rhona Raider  . Colon polyp    Dr. Wynetta Emery  . Dysrhythmia    atrial fib- mild per pt- no meds  . Food allergy    Scallops--no other seafood allergies  . GERD (gastroesophageal reflux disease)   . Hiatal hernia   . Hyperlipidemia   . Impaired glucose tolerance   . Lactose intolerance   . Osteopenia   . PVC's (premature ventricular contractions)   . Seasonal allergies   . Vision problem    wears contacts    Past Surgical History:  Procedure Laterality Date  . BACK SURGERY     several vertbea ae rcemntekd  . CHOLECYSTECTOMY N/A 11/05/2018   Procedure: LAPAROSCOPIC CHOLECYSTECTOMY;  Surgeon: Clovis Riley,  MD;  Location: [facility];  Service: General;  Laterality: N/A;  . ROBOTIC ASSISTED TOTAL HYSTERECTOMY WITH BILATERAL SALPINGO OOPHERECTOMY Bilateral 03/08/2016   Procedure: ROBOTIC ASSISTED TOTAL HYSTERECTOMY WITH BILATERAL SALPINGO OOPHORECTOMY/Uterosacral Ligament Suspension;  Surgeon: Princess Bruins, MD;  Location: [facility];  Service: Gynecology;  Laterality: Bilateral;  . sebaceous cyst excision  2009   neck  . TONSILLECTOMY AND ADENOIDECTOMY  age 29  . VERTEBROPLASTY  05/2009  . WISDOM TOOTH EXTRACTION      Current Outpatient Medications  Medication Sig Dispense Refill  . apixaban (ELIQUIS) 5 MG TABS tablet Take 1 tablet (5 mg total) by mouth 2 (two) times daily. 60 tablet 0  . Cyanocobalamin (VITAMIN B-12 PO) Take 1 tablet by mouth daily.    . metoprolol tartrate (LOPRESSOR) 25 MG tablet TAKE 1/2 TABLET BY MOUTH 2 TIMES DAILY 90 tablet 1  . Multiple Vitamin (MULTIVITAMIN WITH MINERALS) TABS tablet Take 1 tablet by mouth daily.    . Multiple Vitamins-Minerals (ICAPS AREDS 2 PO) Take 1 capsule by mouth 2 (two) times a day.    Marland Kitchen omeprazole (PRILOSEC) 20 MG capsule Take 20 mg by mouth daily.    Marland Kitchen diltiazem (CARDIZEM) 30 MG tablet Take 1 tablet every 4 hours AS NEEDED for AFIB  heart rate >100 0. (Patient not taking: Reported on 05/29/2018) 45 tablet 2  .  fluticasone (FLONASE) 50 MCG/ACT nasal spray Place 1 spray into both nostrils daily as needed for allergies or rhinitis.    Marland Kitchen oxyCODONE-acetaminophen (PERCOCET/ROXICET) 5-325 MG tablet Take 1 tablet by mouth every 6 (six) hours as needed for severe pain. (Patient not taking: Reported on 11/01/2018) 10 tablet 0   No current facility-administered medications for this visit.    Allergies:   Ciprofloxacin, Scallops [shellfish allergy], Apixaban, Cherry, Demerol, Doxycycline, Milk-related compounds, Penicillins, Rivaroxaban, and Sudafed [pseudoephedrine hcl]   Social History:  The patient  reports that she has never smoked. She has never used  smokeless tobacco. She reports that she does not drink alcohol or use drugs.   Family History:  The patient's   family history includes Cancer in her brother and sister; Cancer (age of onset: 66) in her sister; Diabetes in her mother; Heart disease in her mother; Migraines in her daughter; Rheumatic fever in her mother.   ROS:  Please see the history of present illness.   All other systems are personally reviewed and negative.    Exam:    Vital Signs:  Pulse 72   Ht 5\' 2"  (1.575 m)   Wt 149 lb (67.6 kg)   BMI 27.25 kg/m     Well appearing, alert and conversant, regular work of breathing,  good skin color Eyes- anicteric, neuro- grossly intact, skin- no apparent rash or lesions or cyanosis, mouth- oral mucosa is pink   Labs/Other Tests and Data Reviewed:    Recent Labs: 11/02/2018: TSH 0.532 11/06/2018: Magnesium 2.0 11/07/2018: ALT 145; BUN 5; Creatinine, Ser 0.67; Hemoglobin 10.8; Platelets 232; Potassium 3.2; Sodium 142   Wt Readings from Last 3 Encounters:  07/02/19 149 lb (67.6 kg)  11/07/18 154 lb 15.7 oz (70.3 kg)  11/03/18 154 lb 15.7 oz (70.3 kg)     Other studies personally reviewed: Additional studies/ records that were reviewed today include:     ASSESSMENT & PLAN:    Atrial fibrillation paroxysmal\  Increased palpitions, will have her take an extra 25-50 mg metoprolol for prolonged episode  On Anticoagulation;  No bleeding issues   disucssed pain med options, tylenol or opiates  She will followup w her PCP  Discussed sleep and intexns  None found on micromedex for melatonin, some concern for CBD and her daughter will continue to investigae    COVID 19 screen The patient denies symptoms of COVID 19 at this time.  The importance of social distancing was discussed today.  Follow-up: 54m OV    Current medicines are reviewed at length with the patient today.   The patient  concerns regarding her medicines.  The following changes were made today:  As above    Labs/ tests ordered today include: none  No orders of the defined types were placed in this encounter.   Future tests ( post COVID )     Patient Risk:  after full review of this patients clinical status, I feel that they are at moderate  risk at this time.  Today, I have spent 16 minutes with the patient with telehealth technology discussing the above.  Signed, Virl Axe, MD  07/02/2019 2:46 PM     [address] [place] [address] [phone] (office) [phone] (fax)   We spent more than 50% of our >30 min visit in face to face counseling regarding the above

## 2019-07-14 ENCOUNTER — Telehealth: Payer: Self-pay | Admitting: Family Medicine

## 2019-07-14 NOTE — Telephone Encounter (Signed)
Pt dropped off her complete chart from her previous provider. Sending back.

## 2019-07-16 ENCOUNTER — Ambulatory Visit: Payer: Medicare HMO

## 2019-07-23 ENCOUNTER — Telehealth: Payer: PPO | Admitting: Internal Medicine

## 2019-07-25 ENCOUNTER — Other Ambulatory Visit: Payer: Self-pay | Admitting: Internal Medicine

## 2019-07-27 NOTE — Telephone Encounter (Signed)
Pt last saw Dr Caryl Comes 07/02/19 telemedicine Covid-19, last labs 11/07/18 Creat 0.67, age 86, weight 67.6kg, based on specified criteria pt is on appropriate dosage of Eliquis 5mg  BID.  Will refill rx.

## 2019-07-28 DIAGNOSIS — M24119 Other articular cartilage disorders, unspecified shoulder: Secondary | ICD-10-CM | POA: Diagnosis not present

## 2019-07-28 DIAGNOSIS — M545 Low back pain: Secondary | ICD-10-CM | POA: Diagnosis not present

## 2019-07-28 DIAGNOSIS — M25511 Pain in right shoulder: Secondary | ICD-10-CM | POA: Diagnosis not present

## 2019-07-30 ENCOUNTER — Other Ambulatory Visit (HOSPITAL_COMMUNITY): Payer: Self-pay | Admitting: Nurse Practitioner

## 2019-08-17 DIAGNOSIS — D3132 Benign neoplasm of left choroid: Secondary | ICD-10-CM | POA: Diagnosis not present

## 2019-08-17 DIAGNOSIS — H35363 Drusen (degenerative) of macula, bilateral: Secondary | ICD-10-CM | POA: Diagnosis not present

## 2019-08-17 DIAGNOSIS — Z961 Presence of intraocular lens: Secondary | ICD-10-CM | POA: Diagnosis not present

## 2019-08-17 DIAGNOSIS — H353122 Nonexudative age-related macular degeneration, left eye, intermediate dry stage: Secondary | ICD-10-CM | POA: Diagnosis not present

## 2019-08-17 DIAGNOSIS — H353211 Exudative age-related macular degeneration, right eye, with active choroidal neovascularization: Secondary | ICD-10-CM | POA: Diagnosis not present

## 2019-09-04 DIAGNOSIS — M25512 Pain in left shoulder: Secondary | ICD-10-CM | POA: Diagnosis not present

## 2019-09-04 DIAGNOSIS — M25511 Pain in right shoulder: Secondary | ICD-10-CM | POA: Diagnosis not present

## 2019-09-04 DIAGNOSIS — M7541 Impingement syndrome of right shoulder: Secondary | ICD-10-CM | POA: Diagnosis not present

## 2019-09-14 DIAGNOSIS — H26492 Other secondary cataract, left eye: Secondary | ICD-10-CM | POA: Diagnosis not present

## 2019-09-14 DIAGNOSIS — D3132 Benign neoplasm of left choroid: Secondary | ICD-10-CM | POA: Diagnosis not present

## 2019-09-14 DIAGNOSIS — H353211 Exudative age-related macular degeneration, right eye, with active choroidal neovascularization: Secondary | ICD-10-CM | POA: Diagnosis not present

## 2019-09-14 DIAGNOSIS — Z961 Presence of intraocular lens: Secondary | ICD-10-CM | POA: Diagnosis not present

## 2019-09-14 DIAGNOSIS — H527 Unspecified disorder of refraction: Secondary | ICD-10-CM | POA: Diagnosis not present

## 2019-09-14 DIAGNOSIS — H353122 Nonexudative age-related macular degeneration, left eye, intermediate dry stage: Secondary | ICD-10-CM | POA: Diagnosis not present

## 2019-09-14 DIAGNOSIS — Z5112 Encounter for antineoplastic immunotherapy: Secondary | ICD-10-CM | POA: Diagnosis not present

## 2019-09-15 ENCOUNTER — Encounter: Payer: Self-pay | Admitting: Family Medicine

## 2019-09-15 NOTE — Progress Notes (Signed)
Chief Complaint  Patient presents with  . Establish Care    new/old patient to re-establish. On both legs, mostly at night they hurt and her veins look like they are popping. Is having difficulty sleeping-falling and staying asleep. Up most nights. Dr. Caryl Comes rx'd hydrocodone but it did not help. Also mentions some slight memory issues and wonders if she should start [place].     Patient presents to re-establish care. She had been my patient until 2014, at which time she transferred to Dr. Nyoka Cowden, who was also caring for her husband at the time (she is now widowed). Dr. Nyoka Cowden recently retired. She had previously been under the care of Dr. Earlie Counts as well. Records were not available at the time of her visit, but records from Dr. Earlie Counts and Dr. Rolly Salter chart were made available to me after the visit, and over 30 mins time spent reviewing and updating chart.  Today patient reports that she is noting that veins are bulging in different spots on her leg. It is not painful, but she is aware of them.  Only notices at night. L lower leg, R thigh. No swelling. Wears compression socks during the day. Denies any discomfort or bulging at the present time.  She is complaining of having trouble falling asleep and staying asleep over the last year. She has trouble falling asleep, sometimes not until 2am, she just lays there.  She tried watching TV, didn't help.  She doesn't read in bed, reads in recliner.  She has recently been sleeping on the sofa due to shoulder pain.  She doesn't nap during the day. She drinks 2 cups/coffee/d, no recent changes, and the second cup is often decaf.  Dr. Caryl Comes gave her hydrocodone, to see if that helped with sleep, but it didn't (thinking that shoulder pain is interfering with sleep). She has a R torn rotator cuff.  She had a cortisone injection by Dr. Tonita Cong earlier this month, which helped some (took the edge off).  PT not suggested.  Goes about every 3 months.  Previously got  more regular injections; this time was a different injection. Hydrocodone eased the pain, but she still woke up in the middle of the night.  "I'm definitely depressed, I know that" Husband died 5 years ago, feels like yesterday.  Has been hard during the pandemic. She had grief counseling only once. She doesn't get any exercise, other than some housework. She has had her COVID vaccines, and plans to start playing bridge again. She thinks this will help tremendously with her moods. Chart review shows that she had discussed insomnia with Dr. Nyoka Cowden on a couple of occasions--once in 11/2017, when he prescribed trazodone 27m.  Notes from the following month stated she never took it and sleep resolved. She had recurrent insomnia in 09/2018, and he encouraged her to take the trazodone she had at home (unsure if she did or if it helped--not discussed during visit today, found this info afterwards). Also noted that cymbalta 338mwas prescribed for depression in 01/2018, she had declined counseling.  It appears she didn't continue to take it (so didn't come for f/u).  She sees Dr. BeTonita Congor shoulder and back pain, and sees Dr. AlWynelle Linkor her L knee pain. Last pain meds were #14 of norco 5/325 on 07/28/19, #20 on 07/18/2018 (both from ortho).  Review of chart didn't have recent notes from Dr. 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  Pt had declined surgery, and was being managed with injections.  Review of chart shows that she had h/o L1 compression fracture in 05/2009 and L3 compression fracture in 05/2015.  She underwent vertebroplasties in 05/2009 and 06/2015.  Per Dr. Vincente Poli notes, she was treated with Forteo in 2011. Last DEXA 10/2015, T -2.3 at R fem neck. Per pt's memory, she stated she took fosamax for 1 year (later said it wasn't fosamax, was a daily injection; wasn't sure if it was part of a trial, recalled that she  didn't pay for the med). She recalled there was no change on f/u 1 year later. Hasn't been on any meds since, with no further DEXA follow-up.  She also reports some mild memory issues--starting to forget some things.  Can't recall her daughter's address while filling out form this morning in the office, but states that it'll come to her later.  She has macular degeneration, no longer driving, relying on her grandsons.  She is getting injections into the R eye, had laser treatment to the left eye.  She last saw Dr. Caryl Comes for paroxysmal atrial fibrillation in January.  At that time they discussed having increased palpitations, and she could take an extra 25-34m of metoprolol for prolonged episode. She has taken this about once a month or less (last was 2 nights ago), and it "works bRetail banker.  She continues on anticoagulant without any bleeding issues or complications. Review of chart showed that she had L knee hemarthrosis when on coumadin in 03/2017.  Has noted very slight bleeding vaginally (light spot noted on her Depends). Has a Pessary.  Plans to discuss with her GYN again.  HH/GERD:  Takes omeprazole 289mdaily.  She gets occasional heartburn (certain foods trigger), about 2x/month.  If it gets bad, she uses Tums with good results. Large HH was noted on EGD done in 12/2012.  She denies dysphagia.  Uses AZO urinary tract support every day. Currently denies dysuria or abdominal pain.  Review of chart shows she has had some episodes of possible hypoglycemia (visit in 02/2019).  I had spoken to daughter KiMaudie Mercurybout this, and since her other daughter recently visited for a few weeks, and ensuring she is eating properly, hasn't had many further episodes.   PMH, PSH, SH and FH were reviewed and updated in the chart.  Noted to have aortic and coronary atherosclerosis on CT done 2017. Lipids have been elevated--last check 01/2018 TC 241, LDL 163, TG 131, HDL 51, not treated due to age. 02/2019 normal  c-met, CBC, ESR 33   Immunization History  Administered Date(s) Administered  . Fluad Quad(high Dose 65+) 03/01/2019  . Influenza Split 02/27/2012  . Influenza, High Dose Seasonal PF 03/18/2018  . Influenza,inj,quad, With Preservative 03/18/2018, 03/01/2019  . Influenza-Unspecified 03/13/2017  . PFIZER SARS-COV-2 Vaccination 07/30/2019, 08/20/2019  . Pneumococcal Polysaccharide-23 04/26/2004  . Td 11/26/1994, 12/23/2005  . Tdap 03/07/2011  . Zoster 07/16/2005  . Zoster Recombinat (Shingrix) 03/06/2018   Dr. GrRolly Salterecords has Tdap and Prevnar-13 given at [facility] 01/2015, will abstract.  Outpatient Encounter Medications as of 09/16/2019  Medication Sig Note  . Cyanocobalamin (VITAMIN B-12 PO) Take 1 tablet by mouth daily.   . Marland KitchenLIQUIS 5 MG TABS tablet TAKE 1 TABLET BY MOUTH TWICE A DAY   . fluticasone (FLONASE) 50 MCG/ACT nasal spray Place 1 spray into both nostrils daily as needed for allergies or rhinitis.   . metoprolol tartrate (LOPRESSOR) 25 MG tablet TAKE 1/2 TABLET BY  MOUTH 2 TIMES DAILY 09/16/2019: Takes extra 1/2 tablet if needed for palpitations  . Multiple Vitamin (MULTIVITAMIN WITH MINERALS) TABS tablet Take 1 tablet by mouth daily.   . Multiple Vitamins-Minerals (ICAPS AREDS 2 PO) Take 1 capsule by mouth 2 (two) times a day.   Marland Kitchen omeprazole (PRILOSEC) 20 MG capsule Take 20 mg by mouth daily.   . Phenazopyrid-Cranbry-C-Probiot (AZO URINARY TRACT SUPPORT PO) Take 1 tablet by mouth in the morning and at bedtime.   . [DISCONTINUED] diltiazem (CARDIZEM) 30 MG tablet Take 1 tablet every 4 hours AS NEEDED for AFIB  heart rate >100 0. (Patient not taking: Reported on 05/29/2018)   . [DISCONTINUED] oxyCODONE-acetaminophen (PERCOCET/ROXICET) 5-325 MG tablet Take 1 tablet by mouth every 6 (six) hours as needed for severe pain. (Patient not taking: Reported on 11/01/2018)    No facility-administered encounter medications on file as of 09/16/2019.   Allergies  Allergen Reactions  .  Ciprofloxacin Shortness Of Breath and Rash  . Scallops [Shellfish Allergy] Anaphylaxis  . Apixaban Other (See Comments)    fatigue  . Cherry Other (See Comments)    Vertigo  . Demerol Other (See Comments)    Patient states it made her crazy and sleep for 12 hours  . Doxycycline Nausea And Vomiting  . Milk-Related Compounds Diarrhea  . Penicillins Other (See Comments)    Did it involve swelling of the face/tongue/throat, SOB, or low BP? Unknown Did it involve sudden or severe rash/hives, skin peeling, or any reaction on the inside of your mouth or nose? Unknown Did you need to seek medical attention at a hospital or doctor's office? Unknown When did it last happen? If all above answers are "NO", may proceed with cephalosporin use.   . Rivaroxaban Other (See Comments)    Pt unsure but d/c use  . Sudafed [Pseudoephedrine Hcl] Other (See Comments)    hyperactive   ROS: denies fever, chills, URI symptoms, cough, shortness of breath, chest pain.  Palpitations infrequent, and extra dose of metoprolol effective when used.  She denies nausea, vomiting, dysphagia.  Occ heartburn per HPI.  Denies any bowel changes or abdominal pain.  Denies any bleeding (slight bit seen on Depends, per HPI). Denies urinary complaints. Admits to some depression and insomnia. R shoulder pain per HPI.  PHYSICAL EXAM:  BP 140/74   Pulse 72   Temp (!) 97.3 F (36.3 C) (Other (Comment))   Ht '5\' 1"'  (1.549 m)   Wt 156 lb (70.8 kg) Comment: didn't want to remove shoes  BMI 29.48 kg/m   Pleasant, elderly female, appearing slightly younger than stated age, in good spirits. HEENT: conjunctiva and sclera are clear, EOMI.  Fundi not examined.  Wearing mask Neck: 4cm lipoma left posterior neck, nontender.  Pt states is longstanding and denies change.  No lymphadenopathy, thyromegaly or carotid bruit Heart: rate was fairly regular, occasional pause.  No murmur. Lungs: clear bilaterally Back: no spinal or CVA  tenderness Abdomen: soft, nontender, no mass Extremities: no edema. Wearing compression stocking.  No varicosities noted.  Skin not examined. Psych: depressed mood, though full range of affect. Normal eye contact, speech, hygiene and grooming Neuro: alert and oriented. Normal gait.    ASSESSMENT/PLAN:  Osteoporosis without current pathological fracture, unspecified osteoporosis type - s/p L1 fx 2010 and L3 2017, s/p vertebroplasty x 2. Tx'd with Prolia (x 1 year?) in 2011. No treatments or f/u DEXA since 2017. Disc Ca, D, wt-bearing exercise - Plan: DG Bone Density, VITAMIN D 25 Hydroxy (  Vit-D Deficiency, Fractures)  Osteopenia, unspecified location - based on T score of last DEXA.  She has h/o compression fx x 2, so dx truly is osteoporosis - Plan: DG Bone Density  History of vertebroplasty - L1 in 2010, L3 in 2017 - Plan: DG Bone Density  Paroxysmal atrial fibrillation (HCC) - rate controlled, on anticoagulant therapy, sees cardiologist regularly - Plan: TSH  Palpitations - uses extra dose of metoprolol prn with good results - Plan: TSH  Medication monitoring encounter - Plan: Comprehensive metabolic panel, CBC with Differential/Platelet, TSH  Insomnia, unspecified type - may have some depression and pain contributing. Reviewed sleep hygiene. Disc Belsomra, declines tx for now. Will see if improves when mood better (bridge)  Lipoma of neck - stable and unchanged per pt  Gastroesophageal reflux disease without esophagitis - and hiatal hernia; sx controlled with PPI  Chronic right shoulder pain - under care of Dr. Tonita Cong, not interested in surgery; gets injections periodically  Aortic atherosclerosis ([place]) - noted on chest CT 2017. Is on blood thinners, not on statins. Has had borderline/elevated lipids in past, prior PCP elected not to treat.  Address in future   Insomnia--Discussed belsomra, not ready to start Did NOT discuss prior rx for trazodone, and whether or not that was  helpful or caused SE. Did not discuss prior rx for cymbalta (doesn't look like she took it long, if at all, in 01/2018).  Unsure why on AZO daily, and whether this is truly recommended. She plans to f/u with her GYN (due to the spotting she noted, likely related to her Pessary), and to ask about this.  Osteoporosis--This needs to be addressed in order to prevent further fractures.   Repeat DEXA and then start appropriate treatment.  c-met, CBC, TSH (nonfasting today)  F/u 2 months  At f/u appt--to address osteoporosis, insomnia, moods. Also to check if she ever get second dose of Shingrix and if not, what SE/reason?  FTF time was 40 minutes, plus at least 30 minutes in chart review (Dr. Rolly Salter records, Dr. Vincente Poli records, and records in [place]), plus additional time spent documenting visit and updating chart.

## 2019-09-16 ENCOUNTER — Ambulatory Visit (INDEPENDENT_AMBULATORY_CARE_PROVIDER_SITE_OTHER): Payer: Medicare HMO | Admitting: Family Medicine

## 2019-09-16 ENCOUNTER — Encounter: Payer: Self-pay | Admitting: Family Medicine

## 2019-09-16 ENCOUNTER — Other Ambulatory Visit: Payer: Self-pay

## 2019-09-16 VITALS — BP 140/74 | HR 72 | Temp 97.3°F | Ht 61.0 in | Wt 156.0 lb

## 2019-09-16 DIAGNOSIS — M25511 Pain in right shoulder: Secondary | ICD-10-CM | POA: Diagnosis not present

## 2019-09-16 DIAGNOSIS — M810 Age-related osteoporosis without current pathological fracture: Secondary | ICD-10-CM

## 2019-09-16 DIAGNOSIS — I480 Paroxysmal atrial fibrillation: Secondary | ICD-10-CM | POA: Diagnosis not present

## 2019-09-16 DIAGNOSIS — D170 Benign lipomatous neoplasm of skin and subcutaneous tissue of head, face and neck: Secondary | ICD-10-CM | POA: Diagnosis not present

## 2019-09-16 DIAGNOSIS — M8580 Other specified disorders of bone density and structure, unspecified site: Secondary | ICD-10-CM

## 2019-09-16 DIAGNOSIS — R002 Palpitations: Secondary | ICD-10-CM | POA: Diagnosis not present

## 2019-09-16 DIAGNOSIS — G4700 Insomnia, unspecified: Secondary | ICD-10-CM

## 2019-09-16 DIAGNOSIS — K219 Gastro-esophageal reflux disease without esophagitis: Secondary | ICD-10-CM

## 2019-09-16 DIAGNOSIS — Z9889 Other specified postprocedural states: Secondary | ICD-10-CM | POA: Diagnosis not present

## 2019-09-16 DIAGNOSIS — Z5181 Encounter for therapeutic drug level monitoring: Secondary | ICD-10-CM | POA: Diagnosis not present

## 2019-09-16 DIAGNOSIS — I700 Atherosclerosis of aorta: Secondary | ICD-10-CM

## 2019-09-16 DIAGNOSIS — G8929 Other chronic pain: Secondary | ICD-10-CM

## 2019-09-16 NOTE — Patient Instructions (Addendum)
I'm unsure if you truly need to take AZO for your bladder every day (I don't usually recommend this).  You may want to check to see if it was your GYN who recommended this, and if she still feels you need it daily.  If not, you can try and see if taking it every other day, and then eventually stopping it is okay.  [facility] density test. Last one was in 2017.  We are looking for any further decline to see if you would benefit from being on a bone-building medication, to prevent any further fractures.  Technically I think you have osteoporosis, based on having had an L3 vertebroplasty.  I'm guessing this means you had a compression fracture, which thereby changes the diagnosis from thinning of the bones (osteopenia) to osteoporosis,and does warrant treatment. At this point, we should start by repeating the bone density test to see where things are now.  Meditation and visualization techniques can help you sleep. Having a sound machine may help. Grief counseling and/or therapy for depression may be helpful. We briefly mentioned Belsomra as a sleeping medication. We can consider this if your sleep doesn't improve once you resume your bridge games, and try some of the techniques we discussed.   Osteopenia  Osteopenia is a loss of thickness (density) inside of the bones. Another name for osteopenia is low bone mass. Mild osteopenia is a normal part of aging. It is not a disease, and it does not cause symptoms. However, if you have osteopenia and continue to lose bone mass, you could develop a condition that causes the bones to become thin and break more easily (osteoporosis). You may also lose some height, have back pain, and have a stooped posture. Although osteopenia is not a disease, making changes to your lifestyle and diet can help to prevent osteopenia from developing into osteoporosis. What are the causes? Osteopenia is caused by loss of calcium in the  bones.  Bones are constantly changing. Old bone cells are continually being replaced with new bone cells. This process builds new bone. The mineral calcium is needed to build new bone and maintain bone density. Bone density is usually highest around age 59. After that, most people's bodies cannot replace all the bone they have lost with new bone. What increases the risk? You are more likely to develop this condition if:  You are older than age 4.  You are a woman who went through menopause early.  You have a long illness that keeps you in bed.  You do not get enough exercise.  You lack certain nutrients (malnutrition).  You have an overactive thyroid gland (hyperthyroidism).  You smoke.  You drink a lot of alcohol.  You are taking medicines that weaken the bones, such as steroids. What are the signs or symptoms? This condition does not cause any symptoms. You may have a slightly higher risk for bone breaks (fractures), so getting fractures more easily than normal may be an indication of osteopenia. How is this diagnosed? Your health care provider can diagnose this condition with a special type of X-ray exam that measures bone density (dual-energy X-ray absorptiometry, DEXA). This test can measure bone density in your hips, spine, and wrists. Osteopenia has no symptoms, so this condition is usually diagnosed after a routine bone density screening test is done for osteoporosis. This routine screening is usually done for:  Women who are age 88 or older.  Men who are age  9 or older. If you have risk factors for osteopenia, you may have the screening test at an earlier age. How is this treated? Making dietary and lifestyle changes can lower your risk for osteoporosis. If you have severe osteopenia that is close to becoming osteoporosis, your health care provider may prescribe medicines and dietary supplements such as calcium and vitamin D. These supplements help to rebuild bone  density. Follow these instructions at home:   Take over-the-counter and prescription medicines only as told by your health care provider. These include vitamins and supplements.  Eat a diet that is high in calcium and vitamin D. ? Calcium is found in dairy products, beans, salmon, and leafy green vegetables like spinach and broccoli. ? Look for foods that have vitamin D and calcium added to them (fortified foods), such as orange juice, cereal, and bread.  Do 30 or more minutes of a weight-bearing exercise every day, such as walking, jogging, or playing a sport. These types of exercises strengthen the bones.  Take precautions at home to lower your risk of falling, such as: ? Keeping rooms well-lit and free of clutter, such as cords. ? Installing safety rails on stairs. ? Using rubber mats in the bathroom or other areas that are often wet or slippery.  Do not use any products that contain nicotine or tobacco, such as cigarettes and e-cigarettes. If you need help quitting, ask your health care provider.  Avoid alcohol or limit alcohol intake to no more than 1 drink a day for nonpregnant women and 2 drinks a day for men. One drink equals 12 oz of beer, 5 oz of wine, or 1 oz of hard liquor.  Keep all follow-up visits as told by your health care provider. This is important. Contact a health care provider if:  You have not had a bone density screening for osteoporosis and you are: ? A woman, age 57 or older. ? A man, age 91 or older.  You are a postmenopausal woman who has not had a bone density screening for osteoporosis.  You are older than age 84 and you want to know if you should have bone density screening for osteoporosis. Summary  Osteopenia is a loss of thickness (density) inside of the bones. Another name for osteopenia is low bone mass.  Osteopenia is not a disease, but it may increase your risk for a condition that causes the bones to become thin and break more easily  (osteoporosis).  You may be at risk for osteopenia if you are older than age 84 or if you are a woman who went through early menopause.  Osteopenia does not cause any symptoms, but it can be diagnosed with a bone density screening test.  Dietary and lifestyle changes are the first treatment for osteopenia. These may lower your risk for osteoporosis. This information is not intended to replace advice given to you by your health care provider. Make sure you discuss any questions you have with your health care provider. Document Revised: 05/24/2017 Document Reviewed: 03/20/2017 Elsevier Patient Education  [employer].   Insomnia Insomnia is a sleep disorder that makes it difficult to fall asleep or stay asleep. Insomnia can cause fatigue, low energy, difficulty concentrating, mood swings, and poor performance at work or school. There are three different ways to classify insomnia:  Difficulty falling asleep.  Difficulty staying asleep.  Waking up too early in the morning. Any type of insomnia can be long-term (chronic) or short-term (acute). Both are common. Short-term  insomnia usually lasts for three months or less. Chronic insomnia occurs at least three times a week for longer than three months. What are the causes? Insomnia may be caused by another condition, situation, or substance, such as:  Anxiety.  Certain medicines.  Gastroesophageal reflux disease (GERD) or other gastrointestinal conditions.  Asthma or other breathing conditions.  Restless legs syndrome, sleep apnea, or other sleep disorders.  Chronic pain.  Menopause.  Stroke.  Abuse of alcohol, tobacco, or illegal drugs.  Mental health conditions, such as depression.  Caffeine.  Neurological disorders, such as Alzheimer's disease.  An overactive thyroid (hyperthyroidism). Sometimes, the cause of insomnia may not be known. What increases the risk? Risk factors for insomnia include:  Gender. Women  are affected more often than men.  Age. Insomnia is more common as you get older.  Stress.  Lack of exercise.  Irregular work schedule or working night shifts.  Traveling between different time zones.  Certain medical and mental health conditions. What are the signs or symptoms? If you have insomnia, the main symptom is having trouble falling asleep or having trouble staying asleep. This may lead to other symptoms, such as:  Feeling fatigued or having low energy.  Feeling nervous about going to sleep.  Not feeling rested in the morning.  Having trouble concentrating.  Feeling irritable, anxious, or depressed. How is this diagnosed? This condition may be diagnosed based on:  Your symptoms and medical history. Your health care provider may ask about: ? Your sleep habits. ? Any medical conditions you have. ? Your mental health.  A physical exam. How is this treated? Treatment for insomnia depends on the cause. Treatment may focus on treating an underlying condition that is causing insomnia. Treatment may also include:  Medicines to help you sleep.  Counseling or therapy.  Lifestyle adjustments to help you sleep better. Follow these instructions at home: Eating and drinking   Limit or avoid alcohol, caffeinated beverages, and cigarettes, especially close to bedtime. These can disrupt your sleep.  Do not eat a large meal or eat spicy foods right before bedtime. This can lead to digestive discomfort that can make it hard for you to sleep. Sleep habits   Keep a sleep diary to help you and your health care provider figure out what could be causing your insomnia. Write down: ? When you sleep. ? When you wake up during the night. ? How well you sleep. ? How rested you feel the next day. ? Any side effects of medicines you are taking. ? What you eat and drink.  Make your bedroom a dark, comfortable place where it is easy to fall asleep. ? Put up shades or blackout  curtains to block light from outside. ? Use a white noise machine to block noise. ? Keep the temperature cool.  Limit screen use before bedtime. This includes: ? Watching TV. ? Using your smartphone, tablet, or computer.  Stick to a routine that includes going to bed and waking up at the same times every day and night. This can help you fall asleep faster. Consider making a quiet activity, such as reading, part of your nighttime routine.  Try to avoid taking naps during the day so that you sleep better at night.  Get out of bed if you are still awake after 15 minutes of trying to sleep. Keep the lights down, but try reading or doing a quiet activity. When you feel sleepy, go back to bed. General instructions  Take over-the-counter and  prescription medicines only as told by your health care provider.  Exercise regularly, as told by your health care provider. Avoid exercise starting several hours before bedtime.  Use relaxation techniques to manage stress. Ask your health care provider to suggest some techniques that may work well for you. These may include: ? Breathing exercises. ? Routines to release muscle tension. ? Visualizing peaceful scenes.  Make sure that you drive carefully. Avoid driving if you feel very sleepy.  Keep all follow-up visits as told by your health care provider. This is important. Contact a health care provider if:  You are tired throughout the day.  You have trouble in your daily routine due to sleepiness.  You continue to have sleep problems, or your sleep problems get worse. Get help right away if:  You have serious thoughts about hurting yourself or someone else. If you ever feel like you may hurt yourself or others, or have thoughts about taking your own life, get help right away. You can go to your nearest emergency department or call:  Your local emergency services (911 in the [place]  A suicide crisis helpline, such as the [facility] at 306-185-6371. This is open 24 hours a day. Summary  Insomnia is a sleep disorder that makes it difficult to fall asleep or stay asleep.  Insomnia can be long-term (chronic) or short-term (acute).  Treatment for insomnia depends on the cause. Treatment may focus on treating an underlying condition that is causing insomnia.  Keep a sleep diary to help you and your health care provider figure out what could be causing your insomnia. This information is not intended to replace advice given to you by your health care provider. Make sure you discuss any questions you have with your health care provider. Document Revised: 05/24/2017 Document Reviewed: 03/21/2017 Elsevier Patient Education  2020 [employer].

## 2019-09-17 LAB — CBC WITH DIFFERENTIAL/PLATELET
Basophils Absolute: 0.1 10*3/uL (ref 0.0–0.2)
Basos: 1 %
EOS (ABSOLUTE): 0.1 10*3/uL (ref 0.0–0.4)
Eos: 1 %
Hematocrit: 40.7 % (ref 34.0–46.6)
Hemoglobin: 13.4 g/dL (ref 11.1–15.9)
Immature Grans (Abs): 0 10*3/uL (ref 0.0–0.1)
Immature Granulocytes: 0 %
Lymphocytes Absolute: 1.4 10*3/uL (ref 0.7–3.1)
Lymphs: 17 %
MCH: 29.3 pg (ref 26.6–33.0)
MCHC: 32.9 g/dL (ref 31.5–35.7)
MCV: 89 fL (ref 79–97)
Monocytes Absolute: 0.6 10*3/uL (ref 0.1–0.9)
Monocytes: 7 %
Neutrophils Absolute: 6.1 10*3/uL (ref 1.4–7.0)
Neutrophils: 74 %
Platelets: 226 10*3/uL (ref 150–450)
RBC: 4.58 x10E6/uL (ref 3.77–5.28)
RDW: 13.3 % (ref 11.7–15.4)
WBC: 8.2 10*3/uL (ref 3.4–10.8)

## 2019-09-17 LAB — COMPREHENSIVE METABOLIC PANEL
ALT: 15 IU/L (ref 0–32)
AST: 19 IU/L (ref 0–40)
Albumin/Globulin Ratio: 1.6 (ref 1.2–2.2)
Albumin: 4.1 g/dL (ref 3.6–4.6)
Alkaline Phosphatase: 123 IU/L — ABNORMAL HIGH (ref 39–117)
BUN/Creatinine Ratio: 19 (ref 12–28)
BUN: 15 mg/dL (ref 8–27)
Bilirubin Total: 0.2 mg/dL (ref 0.0–1.2)
CO2: 26 mmol/L (ref 20–29)
Calcium: 9.7 mg/dL (ref 8.7–10.3)
Chloride: 101 mmol/L (ref 96–106)
Creatinine, Ser: 0.81 mg/dL (ref 0.57–1.00)
GFR calc Af Amer: 76 mL/min/{1.73_m2} (ref >59)
GFR calc non Af Amer: 66 mL/min/{1.73_m2} (ref >59)
Globulin, Total: 2.5 g/dL (ref 1.5–4.5)
Glucose: 124 mg/dL — ABNORMAL HIGH (ref 65–99)
Potassium: 4.2 mmol/L (ref 3.5–5.2)
Sodium: 140 mmol/L (ref 134–144)
Total Protein: 6.6 g/dL (ref 6.0–8.5)

## 2019-09-17 LAB — VITAMIN D 25 HYDROXY (VIT D DEFICIENCY, FRACTURES): Vit D, 25-Hydroxy: 40.2 ng/mL (ref 30.0–100.0)

## 2019-09-17 LAB — TSH: TSH: 1.41 u[IU]/mL (ref 0.450–4.500)

## 2019-09-20 ENCOUNTER — Encounter: Payer: Self-pay | Admitting: Family Medicine

## 2019-09-20 DIAGNOSIS — M810 Age-related osteoporosis without current pathological fracture: Secondary | ICD-10-CM | POA: Insufficient documentation

## 2019-09-20 DIAGNOSIS — G8929 Other chronic pain: Secondary | ICD-10-CM | POA: Insufficient documentation

## 2019-09-20 DIAGNOSIS — G4700 Insomnia, unspecified: Secondary | ICD-10-CM | POA: Insufficient documentation

## 2019-09-20 DIAGNOSIS — I700 Atherosclerosis of aorta: Secondary | ICD-10-CM | POA: Insufficient documentation

## 2019-09-20 DIAGNOSIS — Z9889 Other specified postprocedural states: Secondary | ICD-10-CM | POA: Insufficient documentation

## 2019-09-21 ENCOUNTER — Encounter: Payer: Self-pay | Admitting: *Deleted

## 2019-09-23 ENCOUNTER — Telehealth: Payer: Self-pay | Admitting: Family Medicine

## 2019-09-23 NOTE — Telephone Encounter (Signed)
Dr Zada Girt dropped off some records for this pt and another pt. I placed them on your shelf on your correspondence folders,

## 2019-09-29 ENCOUNTER — Encounter: Payer: Self-pay | Admitting: Family Medicine

## 2019-10-06 ENCOUNTER — Encounter: Payer: Self-pay | Admitting: Family Medicine

## 2019-10-12 DIAGNOSIS — H353122 Nonexudative age-related macular degeneration, left eye, intermediate dry stage: Secondary | ICD-10-CM | POA: Diagnosis not present

## 2019-10-12 DIAGNOSIS — D3132 Benign neoplasm of left choroid: Secondary | ICD-10-CM | POA: Diagnosis not present

## 2019-10-12 DIAGNOSIS — H353211 Exudative age-related macular degeneration, right eye, with active choroidal neovascularization: Secondary | ICD-10-CM | POA: Diagnosis not present

## 2019-10-14 ENCOUNTER — Telehealth: Payer: Self-pay

## 2019-10-14 MED ORDER — METOPROLOL TARTRATE 25 MG PO TABS: ORAL_TABLET | ORAL | 3 refills | Status: DC

## 2019-10-14 MED ORDER — APIXABAN 5 MG PO TABS: 5.0000 mg | ORAL_TABLET | Freq: Two times a day (BID) | ORAL | 1 refills | Status: DC

## 2019-10-14 NOTE — Telephone Encounter (Signed)
Patient is calling in regards to prescription order for medications. I confirmed that apixaban (ELIQUIS) 5 MG TABS tablet and metoprolol tartrate (LOPRESSOR) 25 MG tablet  medication has been refilled and will be delivered with [facility].

## 2019-10-14 NOTE — Telephone Encounter (Signed)
Pt last saw Dr Caryl Comes 07/02/19 telemedicine Covid-19, last labs 09/16/19 Creat 0.81, age 86, weight 70.8kg, based on specified criteria pt is on appropriate dosage of Eliquis 5mg  BID.  Will refill rx.

## 2019-10-15 ENCOUNTER — Other Ambulatory Visit: Payer: Self-pay | Admitting: Internal Medicine

## 2019-10-16 DIAGNOSIS — M5412 Radiculopathy, cervical region: Secondary | ICD-10-CM | POA: Diagnosis not present

## 2019-10-16 DIAGNOSIS — M542 Cervicalgia: Secondary | ICD-10-CM | POA: Diagnosis not present

## 2019-10-19 ENCOUNTER — Telehealth: Payer: Self-pay | Admitting: Internal Medicine

## 2019-10-19 NOTE — Telephone Encounter (Signed)
Pt c/o medication issue:  1. Name of Medication: apixaban (ELIQUIS) 5 MG TABS tablet  2. How are you currently taking this medication (dosage and times per day)? Pt has not started taking this medication yet   3. Are you having a reaction (difficulty breathing--STAT)?   4. What is your medication issue? Pharmacy called and said the patient has a allergy to the apixaban. The Pharmacy wanted to clarify the allergy before filling the rx. Please use reference # [id number]

## 2019-10-20 NOTE — Telephone Encounter (Signed)
Attempted phone call to pt.  Left message to contact RN at [phone].

## 2019-10-20 NOTE — Telephone Encounter (Signed)
Spoke with pt and verified she is not allergic to Eliquis.  She thought at one time it was causing fatigue but determined it was another medication.  Pt advised RN will remove medication from her Allergy list.  Pt verbalized understanding and agrees with current plan.  Pt advised RN will be contacting pharmacy re: refill.  Phone call to [facility] and spoke with pharmacy tech, Jinny Blossom and pharmacist, Jenny Reichmann, confirmed per pt she does not have an allergy to Eliquis and has been taking medication for over a year with no problem.  Pharmacist states he will remove allergy as well and release medication to be filled.

## 2019-10-21 DIAGNOSIS — Z0100 Encounter for examination of eyes and vision without abnormal findings: Secondary | ICD-10-CM | POA: Diagnosis not present

## 2019-11-09 DIAGNOSIS — D3132 Benign neoplasm of left choroid: Secondary | ICD-10-CM | POA: Diagnosis not present

## 2019-11-09 DIAGNOSIS — H353211 Exudative age-related macular degeneration, right eye, with active choroidal neovascularization: Secondary | ICD-10-CM | POA: Diagnosis not present

## 2019-11-09 DIAGNOSIS — Z961 Presence of intraocular lens: Secondary | ICD-10-CM | POA: Diagnosis not present

## 2019-11-09 DIAGNOSIS — H353122 Nonexudative age-related macular degeneration, left eye, intermediate dry stage: Secondary | ICD-10-CM | POA: Diagnosis not present

## 2019-11-15 NOTE — Progress Notes (Signed)
Chief Complaint  Patient presents with  . Follow-up    and med check. Sleep issues. Has small things on her legs she wants you to look at. Her hands have bad arthritis. Wants to know if she can take [place].     Patient presents for follow-up from her establish-care visit 2 months ago.  At her last visit we discussed her insomnia.  She felt like there may be some depression contributing (related to the loss of her husband 5 years ago), and having a harder time during the pandemic. She was having trouble falling asleep and staying asleep.  We discussed sleep hygiene measures, possible medications, but she wasn't interested in trying medication.  She was hoping that her moods would improve once she started playing bridge with friends, now that everyone is vaccinated.  We had briefly mentioned Belsomra at her last visit.  Upon review of old records after her last visit, noted that she had previously been prescribed Trazodone (11/2017, never took it, recommended again at a visit 09/2018--?not sure if helpful, SE? Ever tried it?).  Looks like she had also been prescribed cymbalta in 01/2018, though it doesn't look like she took it long, if at all).  These have not yet been discussed with patient.  Last night she went to bed at 12, was up at 3, went back to bed at 6, and slept until noon.  Today she is exhausted. Sleep has been "horrible" since her last visit. She tried Benadryl, only worked for a couple of days. She tried some relaxation techniques. She doesn't remember if she ever tried the Trazodone that was prescribed by Dr. Carlota Raspberry.  She hasn't started playing bridge--her sleeping is so bad that she wakes up too late to play most days.  She continues to have some shoulder pain (but hydrocodone didn't help her sleep when tried), periodically gets cortisone shots which helps some. She went back to Dr. Tonita Cong earlier this month, when she had a lot of pain in her neck, exacerbated by a position she was in  for her last eye injections.  Treated with prednisone, and neck pain resolved.  She has h/o L1 compression fracture in 05/2009 and L3 compression fracture in 05/2015.  She underwent vertebroplasties in 05/2009 and 06/2015.  Per Dr. Vincente Poli notes, she was treated with Forteo in 2011. Last DEXA 10/2015, T -2.3 at R fem neck. Hasn't been on any meds since, with no further DEXA follow-up.  DEXA is scheduled for 11/30/19.  Aortic atherosclerosis- noted on chest CT 2017. Is on blood thinners, not on statins. Has had borderline/elevated lipids in past, prior PCP elected not to treat. last lipids: 01/2018 TC 241, LDL 163, TG 131, HDL 51  The last question after review of records obtained after her visit, is related to whether or not she got her last Shingrix vaccine. We only found record for one. She cannot recall whether she did or not. Immunization History  Administered Date(s) Administered  . Fluad Quad(high Dose 65+) 03/01/2019  . Influenza Split 02/27/2012  . Influenza, High Dose Seasonal PF 03/18/2018  . Influenza,inj,quad, With Preservative 03/18/2018, 03/01/2019  . Influenza-Unspecified 03/13/2017  . PFIZER SARS-COV-2 Vaccination 07/30/2019, 08/20/2019  . Pneumococcal Conjugate-13 01/24/2015  . Pneumococcal Polysaccharide-23 04/26/2004  . Td 11/26/1994, 12/23/2005  . Tdap 03/07/2011, 01/24/2015  . Zoster 07/16/2005  . Zoster Recombinat (Shingrix) 03/06/2018   PMH, PSH, SH reviewed  Outpatient Encounter Medications as of 11/16/2019  Medication Sig  . apixaban (ELIQUIS) 5 MG TABS tablet  Take 1 tablet (5 mg total) by mouth 2 (two) times daily.  . Cyanocobalamin (VITAMIN B-12 PO) Take 1 tablet by mouth daily.  . fluticasone (FLONASE) 50 MCG/ACT nasal spray Place 1 spray into both nostrils daily as needed for allergies or rhinitis.  . metoprolol tartrate (LOPRESSOR) 25 MG tablet TAKE 1/2 TABLET BY MOUTH 2 TIMES DAILY  . Multiple Vitamin (MULTIVITAMIN WITH MINERALS) TABS tablet Take 1 tablet  by mouth daily.  . Multiple Vitamins-Minerals (ICAPS AREDS 2 PO) Take 1 capsule by mouth 2 (two) times a day.  Marland Kitchen omeprazole (PRILOSEC) 20 MG capsule Take 20 mg by mouth daily.  . [DISCONTINUED] Phenazopyrid-Cranbry-C-Probiot (AZO URINARY TRACT SUPPORT PO) Take 1 tablet by mouth in the morning and at bedtime.   No facility-administered encounter medications on file as of 11/16/2019.   Allergies  Allergen Reactions  . Ciprofloxacin Shortness Of Breath and Rash  . Scallops [Shellfish Allergy] Anaphylaxis  . Cherry Other (See Comments)    Vertigo  . Demerol Other (See Comments)    Patient states it made her crazy and sleep for 12 hours  . Doxycycline Nausea And Vomiting  . Milk-Related Compounds Diarrhea  . Penicillins Other (See Comments)    Did it involve swelling of the face/tongue/throat, SOB, or low BP? Unknown Did it involve sudden or severe rash/hives, skin peeling, or any reaction on the inside of your mouth or nose? Unknown Did you need to seek medical attention at a hospital or doctor's office? Unknown When did it last happen? If all above answers are "NO", may proceed with cephalosporin use.   . Rivaroxaban Other (See Comments)    Pt unsure but d/c use  . Sudafed [Pseudoephedrine Hcl] Other (See Comments)    hyperactive    ROS:  No fever, chills, URI symptoms, headaches, dizziness, chest pain, shortness of breath, edema, muscle cramps. +decreased vision related to macular degeneration, on treatment. Denies GI complaints, dysuria, hematuria. Denies bleeding (on eliquis), rash. Insomnia per HPI Sheds a tear periodically, not "full crying", misses her husband a lot. Denies anhedonia. Shoulder pain is stable, neck pain resolved with course of steroids. See HPI   PHYSICAL EXAM:  BP 140/60   Pulse 72   Ht '5\' 1"'$  (1.549 m)   Wt 156 lb 12.8 oz (71.1 kg)   BMI 29.63 kg/m   Wt Readings from Last 3 Encounters:  09/16/19 156 lb (70.8 kg)  07/02/19 149 lb (67.6 kg)   11/07/18 154 lb 15.7 oz (70.3 kg)   Pleasant, elderly female, in good spirits, though she seems tired and not quite as put together as she was at last visit, seeming a little more scattered. HEENT: conjunctiva and sclera are clear, EOMI.  Fundi not examined.  Wearing mask Neck: No lymphadenopathy, thyromegaly or carotid bruit Heart:  Regular rate and rhythm, no murmur Lungs: clear bilaterally Back: no spinal or CVA tenderness Abdomen: soft, nontender, no mass Extremities: no edema. The medial portion of her left ankle is violaceous, blanches, minimally tender.  Has some OA changes to her fingers on both hands. Psych: Normal mood, though full range of affect. Normal eye contact, speech, hygiene and grooming. As reported above, somewhat scattered/tired. Neuro: alert and oriented. Normal gait.  PHQ-9 score of 9   ASSESSMENT/PLAN:  Aortic atherosclerosis ([place]) - Discussed dx, and reasons to be on statin; she is willing to try. Start Crestor. Risks/SE reviewed - Plan: rosuvastatin (CRESTOR) 10 MG tablet  Insomnia, unspecified type - Try trazodone 25-'75mg'$  qHS. Consider antidepressant,  but will start with a sleep aid, since unable to play bridge due to insomnia - Plan: traZODone (DESYREL) 50 MG tablet  Osteoporosis without current pathological fracture, unspecified osteoporosis type  Pure hypercholesterolemia - low cholesterol diet info given and briefly reviewed - Plan: Lipid panel  Medication monitoring encounter - Plan: Lipid panel, Comprehensive metabolic panel  Insomnia--may be isolated, some component of mild depression.  I think the fact that she hasn't been able to commit to a bridge game due to her poor sleep affects moods.  Will try Trazodone (she doesn't recall if she ever took this or not); If not working or not effective, can consider Belsomra.  Also to consider Zoloft if persistent depression.  Atherosclerosis, hyperlipidemia, never previously treated. Reviewed dx, risks, and  reasons to treat, as well as risks/SE of meds. To start Crestor 95m daily, add CoQ10 if SE, if still with SE, to [place].   F/u in 2 months with fasting labs prior (c-met, lipids)  FTF time approx 35 min, with additional time spent in chart review and documentation.

## 2019-11-16 ENCOUNTER — Encounter: Payer: Self-pay | Admitting: Family Medicine

## 2019-11-16 ENCOUNTER — Ambulatory Visit (INDEPENDENT_AMBULATORY_CARE_PROVIDER_SITE_OTHER): Payer: Medicare HMO | Admitting: Family Medicine

## 2019-11-16 ENCOUNTER — Other Ambulatory Visit: Payer: Self-pay

## 2019-11-16 ENCOUNTER — Other Ambulatory Visit: Payer: Self-pay | Admitting: Internal Medicine

## 2019-11-16 VITALS — BP 140/60 | HR 72 | Ht 61.0 in | Wt 156.8 lb

## 2019-11-16 DIAGNOSIS — G4700 Insomnia, unspecified: Secondary | ICD-10-CM

## 2019-11-16 DIAGNOSIS — M810 Age-related osteoporosis without current pathological fracture: Secondary | ICD-10-CM | POA: Diagnosis not present

## 2019-11-16 DIAGNOSIS — E7800 Pure hypercholesterolemia, unspecified: Secondary | ICD-10-CM

## 2019-11-16 DIAGNOSIS — I700 Atherosclerosis of aorta: Secondary | ICD-10-CM

## 2019-11-16 DIAGNOSIS — Z5181 Encounter for therapeutic drug level monitoring: Secondary | ICD-10-CM

## 2019-11-16 MED ORDER — TRAZODONE HCL 50 MG PO TABS: 25.0000 mg | ORAL_TABLET | Freq: Every day | ORAL | 2 refills | Status: DC

## 2019-11-16 MED ORDER — ROSUVASTATIN CALCIUM 10 MG PO TABS: 10.0000 mg | ORAL_TABLET | Freq: Every day | ORAL | 2 refills | Status: DC

## 2019-11-16 NOTE — Telephone Encounter (Signed)
Prescription refill request for Eliquis received.  Last office visit: Samantha Foley 07/02/2019 Scr: 0.81, 09/16/2019 Age: 86 yo Weight: 71.1 kg   Prescription refill sent.

## 2019-11-16 NOTE — Patient Instructions (Addendum)
We only have 1 Shingrix documented (the newer type of shingles vaccine).  Wondering if you can check with the pharmacy where you got it, to see if you ever go the second one, to complete the series.  If you did, please get [place] the date so we can update your chart.  Ask the family whether or not you snore, or if they hear you taking long pauses in your breathing while you sleep. If so, we should consider a sleep study.  Waking up feeling unrefreshed and being tired during the day, along with snoring and pauses, can indicate sleep apnea, which should be treatment.  Review of your chart showed atherosclerosis in the aorta, and that you had high cholesterol (last checked in 01/2018, total cholesterol 241, LDL 163).  Given this, I recommend starting a cholesterol-lowering medication (a statin) to help prevent cardiovascular disease.  I am starting you on 10mg  of rosuvastatin (Crestor).  Take it once daily.  If you get any kind of muscular side effects or soreness, you can add taking a daily Coenzyme Q10.  If your side effects persist, please call us--we can decrease the frequency of the dosing.  Take trazodone at bedtime for sleep.  Start with 1/2 tablet. If this doesn't do anything for you, then increase to taking the full tablet.  If the full tablet isn't helping you sleep at all (using it for a few nights in a row), then you can increase to 1.5 tablets.  If this isn't helping, let us know.  I encourage you to consider contacting hospice Water quality scientist) for additional grief counseling.   Atherosclerosis  Atherosclerosis is narrowing and hardening of the arteries. Arteries are blood vessels that carry blood from the heart to all parts of the body. This blood contains oxygen. Arteries can become narrow or clogged with a buildup of fat, cholesterol, calcium, and other substances (plaque). Plaque decreases the amount of blood that can flow through the artery. Atherosclerosis can affect any artery in  the body, including:  Heart arteries (coronary artery disease). This may cause a heart attack.  Brain arteries. This may cause a stroke (cerebrovascular accident).  Leg, arm, and pelvis arteries (peripheral artery disease). This may cause pain and numbness.  Kidney arteries. This may cause kidney (renal) failure. Treatment may slow the disease and prevent further damage to the heart, brain, peripheral arteries, and kidneys. What are the causes? Atherosclerosis develops slowly over many years. The inner layers of your arteries become damaged and allow the gradual buildup of plaque. The exact cause of atherosclerosis is not fully understood. Symptoms of atherosclerosis do not occur until the artery becomes narrow or blocked. What increases the risk? The following factors may make you more likely to develop this condition:  High blood pressure.  High cholesterol.  Being middle-aged or older.  Having a family history of atherosclerosis.  Having high blood fats (triglycerides).  Diabetes.  Being overweight.  Smoking tobacco.  Not exercising enough (sedentary lifestyle).  Having a substance in the blood called C-reactive protein (CRP). This is a sign of increased levels of inflammation in the body.  Sleep apnea.  Being stressed.  Drinking too much alcohol. What are the signs or symptoms? This condition may not cause any symptoms. If you have symptoms, they are caused by damage to an area of your body that is not getting enough blood.  Coronary artery disease may cause chest pain and shortness of breath.  Decreased blood supply to your brain may cause  a stroke. Signs of a stroke may include sudden: ? Weakness on one side of the body. ? Confusion. ? Changes in vision. ? Inability to speak or understand speech. ? Loss of balance, coordination, or the ability to walk. ? Severe headache. ? Loss of consciousness.  Peripheral arterial disease may cause pain and numbness,  often in the legs and hips.  Renal failure may cause fatigue, nausea, swelling, and itchy skin. How is this diagnosed? This condition is diagnosed based on your medical history and a physical exam. During the exam:  Your health care provider will: ? Check your pulse in different places. ? Listen for a "whooshing" sound over your arteries (bruit).  You may have tests, such as: ? Blood tests to check your levels of cholesterol, triglycerides, and CRP. ? Electrocardiogram (ECG) to check for heart damage. ? Chest X-ray to see if you have an enlarged heart, which is a sign of heart failure. ? Stress test to see how your heart reacts to exercise. ? Echocardiogram to get images of the inside of your heart. ? Ankle-brachial index to compare blood pressure in your arms to blood pressure in your ankles. ? Ultrasound of your peripheral arteries to check blood flow. ? CT scan to check for damage to your heart or brain. ? X-rays of blood vessels after dye has been injected (angiogram) to check blood flow. How is this treated? Treatment starts with lifestyle changes, which may include:  Changing your diet.  Losing weight.  Reducing stress.  Exercising and being physically active more regularly.  Not smoking. You may also need medicine to:  Lower triglycerides and cholesterol.  Control blood pressure.  Prevent blood clots.  Lower inflammation in your body.  Control your blood sugar. Sometimes, surgery is needed to:  Remove plaque from an artery (endarterectomy).  Open or widen a narrowed heart artery (angioplasty).  Create a new path for your blood with one of these procedures: ? Heart (coronary) artery bypass graft surgery. ? Peripheral artery bypass graft surgery. Follow these instructions at home: Eating and drinking   Eat a heart-healthy diet. Talk with your health care provider or a diet and nutrition specialist (dietitian) if you need help. A heart-healthy diet  involves: ? Limiting unhealthy fats and increasing healthy fats. Some examples of healthy fats are olive oil and canola oil. ? Eating plant-based foods, such as fruits, vegetables, nuts, whole grains, and legumes (such as peas and lentils).  Limit alcohol intake to no more than 1 drink a day for nonpregnant women and 2 drinks a day for men. One drink equals 12 oz of beer, 5 oz of wine, or 1 oz of hard liquor. Lifestyle  Follow an exercise program as told by your health care provider.  Maintain a healthy weight. Lose weight if your health care provider says that you need to do that.  Rest when you are tired.  Learn to manage your stress.  Do not use any products that contain nicotine or tobacco, such as cigarettes and e-cigarettes. If you need help quitting, ask your health care provider.  Do not abuse drugs. General instructions  Take over-the-counter and prescription medicines only as told by your health care provider.  Manage other health conditions as told by your health care provider.  Keep all follow-up visits as told by your health care provider. This is important. Contact a health care provider if:  You have chest pain or discomfort. This includes squeezing chest pain that may feel like indigestion (  angina).  You have shortness of breath.  You have an irregular heartbeat.  You have unexplained fatigue.  You have unexplained pain or numbness in an arm, leg, or hip.  You have nausea, swelling of your hands or feet, and itchy skin. Get help right away if:  You have any symptoms of a heart attack, such as: ? Chest pain. ? Shortness of breath. ? Pain in your neck, jaw, arms, back, or stomach. ? Cold sweat. ? Nausea. ? Light-headedness.  You have any symptoms of a stroke. "BE FAST" is an easy way to remember the main warning signs of a stroke: ? B - Balance. Signs are dizziness, sudden trouble walking, or loss of balance. ? E - Eyes. Signs are trouble seeing or a  sudden change in vision. ? F - Face. Signs are sudden weakness or numbness of the face, or the face or eyelid drooping on one side. ? A - Arms. Signs are weakness or numbness in an arm. This happens suddenly and usually on one side of the body. ? S - Speech. Signs are sudden trouble speaking, slurred speech, or trouble understanding what people say. ? T - Time. Time to call emergency services. Write down what time symptoms started.  You have other signs of a stroke, such as: ? A sudden, severe headache with no known cause. ? Nausea or vomiting. ? Seizure. These symptoms may represent a serious problem that is an emergency. Do not wait to see if the symptoms will go away. Get medical help right away. Call your local emergency services (911 in the [place] Do not drive yourself to the hospital. Summary  Atherosclerosis is narrowing and hardening of the arteries.  Arteries can become narrow or clogged with a buildup of fat, cholesterol, calcium, and other substances (plaque).  This condition may not cause any symptoms. If you do have symptoms, they are caused by damage to an area of your body that is not getting enough blood.  Treatment may include lifestyle changes and medicines. In some cases, surgery is needed. This information is not intended to replace advice given to you by your health care provider. Make sure you discuss any questions you have with your health care provider. Document Revised: 09/20/2017 Document Reviewed: 02/14/2017 Elsevier Patient Education  [place] and Cholesterol Restricted Eating Plan Getting too much fat and cholesterol in your diet may cause health problems. Choosing the right foods helps keep your fat and cholesterol at normal levels. This can keep you from getting certain diseases.  Meal planning  At meals, divide your plate into four equal parts: ? Fill one-half of your plate with vegetables and green salads. ? Fill one-fourth of your  plate with whole grains. ? Fill one-fourth of your plate with low-fat (lean) protein foods.  Eat fish that is high in omega-3 fats at least two times a week. This includes mackerel, tuna, sardines, and salmon.  Eat foods that are high in fiber, such as whole grains, beans, apples, broccoli, carrots, peas, and barley.   General tips   Work with your doctor to lose weight if you need to.  Avoid: ? Foods with added sugar. ? Fried foods. ? Foods with partially hydrogenated oils.  Limit alcohol intake to no more than 1 drink a day for nonpregnant women and 2 drinks a day for men. One drink equals 12 oz of beer, 5 oz of wine, or 1 oz of hard liquor.   Reading food labels  Check food labels for: ? Trans fats. ? Partially hydrogenated oils. ? Saturated fat (g) in each serving. ? Cholesterol (mg) in each serving. ? Fiber (g) in each serving.  Choose foods with healthy fats, such as: ? Monounsaturated fats. ? Polyunsaturated fats. ? Omega-3 fats.  Choose grain products that have whole grains. Look for the word "whole" as the first word in the ingredient list. Cooking  Cook foods using low-fat methods. These include baking, boiling, grilling, and broiling.  Eat more home-cooked foods. Eat at restaurants and buffets less often. Avoid cooking using saturated fats, such as butter, cream, palm oil, palm kernel oil, and coconut oil.  Recommended foods  Fruits  All fresh, canned (in natural juice), or frozen fruits. Vegetables  Fresh or frozen vegetables (raw, steamed, roasted, or grilled). Green salads. Grains  Whole grains, such as whole wheat or whole grain breads, crackers, cereals, and pasta. Unsweetened oatmeal, bulgur, barley, quinoa, or brown rice. Corn or whole wheat flour tortillas. Meats and other protein foods  Ground beef (85% or leaner), grass-fed beef, or beef trimmed of fat. Skinless chicken or [place]. Ground chicken or [place]. Pork trimmed of fat. All fish and  seafood. Egg whites. Dried beans, peas, or lentils. Unsalted nuts or seeds. Unsalted canned beans. Nut butters without added sugar or oil. Dairy  Low-fat or nonfat dairy products, such as skim or 1% milk, 2% or reduced-fat cheeses, low-fat and fat-free ricotta or cottage cheese, or plain low-fat and nonfat yogurt. Fats and oils  Tub margarine without trans fats. Light or reduced-fat mayonnaise and salad dressings. Avocado. Olive, canola, sesame, or safflower oils. The items listed above may not be a complete list of foods and beverages you can eat. Contact a dietitian for more information.  Foods to avoid Fruits  Canned fruit in heavy syrup. Fruit in cream or butter sauce. Fried fruit. Vegetables  Vegetables cooked in cheese, cream, or butter sauce. Fried vegetables. Grains  White bread. White pasta. White rice. Cornbread. Bagels, pastries, and croissants. Crackers and snack foods that contain trans fat and hydrogenated oils. Meats and other protein foods  Fatty cuts of meat. Ribs, chicken wings, bacon, sausage, bologna, salami, chitterlings, fatback, hot dogs, bratwurst, and packaged lunch meats. Liver and organ meats. Whole eggs and egg yolks. Chicken and [place] with skin. Fried meat. Dairy  Whole or 2% milk, cream, half-and-half, and cream cheese. Whole milk cheeses. Whole-fat or sweetened yogurt. Full-fat cheeses. Nondairy creamers and whipped toppings. Processed cheese, cheese spreads, and cheese curds. Beverages  Alcohol. Sugar-sweetened drinks such as sodas, lemonade, and fruit drinks. Fats and oils  Butter, stick margarine, lard, shortening, ghee, or bacon fat. Coconut, palm kernel, and palm oils. Sweets and desserts  Corn syrup, sugars, honey, and molasses. Candy. Jam and jelly. Syrup. Sweetened cereals. Cookies, pies, cakes, donuts, muffins, and ice cream. The items listed above may not be a complete list of foods and beverages you should avoid. Contact a dietitian for  more information. Summary  Choosing the right foods helps keep your fat and cholesterol at normal levels. This can keep you from getting certain diseases.  At meals, fill one-half of your plate with vegetables and green salads.  Eat high-fiber foods, like whole grains, beans, apples, carrots, peas, and barley.  Limit added sugar, saturated fats, alcohol, and fried foods. This information is not intended to replace advice given to you by your health care provider. Make sure you discuss any questions you have with your health care provider. Document Revised:  02/12/2018 Document Reviewed: 02/26/2017 Elsevier Patient Education  [place].

## 2019-11-17 ENCOUNTER — Encounter: Payer: Self-pay | Admitting: Family Medicine

## 2019-11-18 DIAGNOSIS — M1711 Unilateral primary osteoarthritis, right knee: Secondary | ICD-10-CM | POA: Diagnosis not present

## 2019-11-18 DIAGNOSIS — M545 Low back pain: Secondary | ICD-10-CM | POA: Diagnosis not present

## 2019-11-25 DIAGNOSIS — M179 Osteoarthritis of knee, unspecified: Secondary | ICD-10-CM | POA: Diagnosis not present

## 2019-11-25 DIAGNOSIS — M545 Low back pain: Secondary | ICD-10-CM | POA: Diagnosis not present

## 2019-11-25 DIAGNOSIS — M1712 Unilateral primary osteoarthritis, left knee: Secondary | ICD-10-CM | POA: Diagnosis not present

## 2019-11-30 ENCOUNTER — Other Ambulatory Visit: Payer: Medicare HMO

## 2019-12-02 ENCOUNTER — Ambulatory Visit
Admission: RE | Admit: 2019-12-02 | Discharge: 2019-12-02 | Disposition: A | Discharge status: Home / Self Care | Payer: Medicare HMO | Source: Ambulatory Visit | Attending: Family Medicine | Admitting: Family Medicine

## 2019-12-02 ENCOUNTER — Other Ambulatory Visit: Payer: Self-pay

## 2019-12-02 DIAGNOSIS — Z9889 Other specified postprocedural states: Secondary | ICD-10-CM

## 2019-12-02 DIAGNOSIS — M810 Age-related osteoporosis without current pathological fracture: Secondary | ICD-10-CM

## 2019-12-02 DIAGNOSIS — Z780 Asymptomatic menopausal state: Secondary | ICD-10-CM | POA: Diagnosis not present

## 2019-12-02 DIAGNOSIS — M1712 Unilateral primary osteoarthritis, left knee: Secondary | ICD-10-CM | POA: Diagnosis not present

## 2019-12-02 DIAGNOSIS — M8580 Other specified disorders of bone density and structure, unspecified site: Secondary | ICD-10-CM

## 2019-12-02 DIAGNOSIS — M85832 Other specified disorders of bone density and structure, left forearm: Secondary | ICD-10-CM | POA: Diagnosis not present

## 2019-12-07 ENCOUNTER — Encounter: Payer: Self-pay | Admitting: Internal Medicine

## 2019-12-07 ENCOUNTER — Telehealth (HOSPITAL_COMMUNITY): Payer: Self-pay

## 2019-12-07 NOTE — Telephone Encounter (Signed)
Patient called and states she has been in A-fib since yesterday. She does not  own a blood pressure cuff. Her heart rate was 70. She had taken an extra 1/2 tablet of her Metoprolol to see if she would convert. This morning around 10:00am she converted back to normal sinus rhythm. Discussed with Roderic Palau -NP and she is scheduled to come in for appointment this week on 6/17 @ 2:30pm. Patient was given directions and the parking code for June and she verbalized understanding.

## 2019-12-07 NOTE — Telephone Encounter (Signed)
error 

## 2019-12-08 NOTE — Progress Notes (Signed)
Chief Complaint  Patient presents with  . Results    dexa results. Seeing cardio tomorrow.    Coming in from the parking lot, MA witnessed her trip on the curb and fall--she fell forward onto her knees, palms, face. She had some bleeding on the lip, scrapes at the knees.  She was able to sit up.  Denied neck pain.  She was able to get into wheelchair to come into the building for visit. She reported feeling fine during the visit.  She was given ice to her swollen lip, and to her knees and palms. She is accompanied today by her daughter [name].   Patient presents to discuss her DEXA results, which showed osteoporosis. She had a normal vitamin D level in 08/2019. She hash/o L1 compression fracture in 05/2009 and L3 compression fracture in 05/2015. She underwent vertebroplasties in 05/2009 and 06/2015. Per Dr. Vincente Poli notes, she was treated with Forteo in 2011. Per daughter [name], recalls using bisphosphonate in the past. Unsure of details (which one, for how long, but thinks prior to the [place]).  DEXA 12/03/2019: ASSESSMENT: The BMD measured at Femur Neck Right is 0.667 g/cm2 with a T-score of -2.7. This patient is considered osteoporotic according to [facility]) criteria.  The scan quality is good. Lumbar spine was not utilized due to advanced degenerative changes.  Site Region Measured Date Measured Age [age]A BMD Significant CHANGE T-score DualFemur Neck Right 12/02/2019 87.5 -2.9 0.667 g/cm2 * DualFemur Neck Right 11/01/2015 83.4 -2.3 0.718 g/cm2  DualFemur Total Mean 12/02/2019 87.5 -1.9 0.788 g/cm2 * DualFemur Total Mean 11/01/2015 83.4 -1.2 0.851 g/cm2  Left Forearm Radius 33% 12/02/2019 87.5 -[age] 0.708 g/cm2 Left Forearm Radius 33% 11/01/2015 83.4 -1.4 0.763 g/cm2  She was last seen in May, at which time she was started on Crestor for hyperlipidemia and aortic atherosclerosis.  She reports tolerating this medication without side effects.  She was  also prescribed trazodone to help with her insomnia (to start at 25mg , titrate up to 75 mg if lower doses not effective). She started at 25mg  dose, didn't help.  50mg  tablet was effective--she would fall asleep, but if she got up to go to the bathroom, she might have trouble getting back to sleep. A few nights ago, she was absolutely exhausted--fell asleep before even saying her prayers.  She slept all night, went to bathroom once and was able to get back to sleep.  Since that night, she has slept very well, without taking any Trazodone.  She has slept very well for the last 3-4 nights.  She reports that since her last visit, she developed sciatica, seeing Dr. Tonita Cong. She had been at the ortho office for L knee injections, and was in terrible back pain. They set her up to see Dr. Tonita Cong.  She reports she had an injection, helped some. Knee is doing well s/p injection.   PMH, PSH, SH reviewed  Outpatient Encounter Medications as of 12/09/2019  Medication Sig  . cholecalciferol (VITAMIN D3) 25 MCG (1000 UNIT) tablet Take 1,000 Units by mouth daily.  . Cyanocobalamin (VITAMIN B-12 PO) Take 1 tablet by mouth daily.  Marland Kitchen ELIQUIS 5 MG TABS tablet TAKE 1 TABLET BY MOUTH TWICE A DAY  . fluticasone (FLONASE) 50 MCG/ACT nasal spray Place 1 spray into both nostrils daily as needed for allergies or rhinitis.  . metoprolol tartrate (LOPRESSOR) 25 MG tablet TAKE 1/2 TABLET BY MOUTH 2 TIMES DAILY  . Multiple Vitamin (MULTIVITAMIN WITH MINERALS) TABS tablet Take  1 tablet by mouth daily.  . Multiple Vitamins-Minerals (ICAPS AREDS 2 PO) Take 1 capsule by mouth 2 (two) times a day.  Marland Kitchen omeprazole (PRILOSEC) 20 MG capsule Take 20 mg by mouth daily.  . rosuvastatin (CRESTOR) 10 MG tablet Take 1 tablet (10 mg total) by mouth daily.  . traZODone (DESYREL) 50 MG tablet Take 0.5-1 tablets (25-50 mg total) by mouth at bedtime.   No facility-administered encounter medications on file as of 12/09/2019.   Tums most  nights  Allergies  Allergen Reactions  . Ciprofloxacin Shortness Of Breath and Rash  . Scallops [Shellfish Allergy] Anaphylaxis  . Cherry Other (See Comments)    Vertigo  . Demerol Other (See Comments)    Patient states it made her crazy and sleep for 12 hours  . Doxycycline Nausea And Vomiting  . Milk-Related Compounds Diarrhea  . Penicillins Other (See Comments)    Did it involve swelling of the face/tongue/throat, SOB, or low BP? Unknown Did it involve sudden or severe rash/hives, skin peeling, or any reaction on the inside of your mouth or nose? Unknown Did you need to seek medical attention at a hospital or doctor's office? Unknown When did it last happen? If all above answers are "NO", may proceed with cephalosporin use.   . Rivaroxaban Other (See Comments)    Pt unsure but d/c use  . Sudafed [Pseudoephedrine Hcl] Other (See Comments)    hyperactive     ROS: no fever, chills, URI symptoms.  She has occasional heartburn, no dysphagia. No headaches, dizziness, chest pain. No myalgias, GI complaints. L knee pain, improved with injections, and back pain/sciatica per HPI. Moods are better.  No other complaints. See HPI.   PHYSICAL EXAM:  BP 120/82   Pulse 92   Temp 99.1 F (37.3 C) (Tympanic)   Ht 5' (1.524 m)   Wt 153 lb (69.4 kg) Comment: per patient  BMI 29.88 kg/m   Wt Readings from Last 3 Encounters:  12/09/19 153 lb (69.4 kg)  11/16/19 156 lb 12.8 oz (71.1 kg)  09/16/19 156 lb (70.8 kg)   Well-appearing, pleasant female.  Initially appeared shaken up after fall (when evaluated outside).  Inside she appeared calm, in very good spirits. She had soft tissue swelling of the right side of the upper lip.  Small cuts on the upper and lower lips, but no active bleeding. Teeth are intact. Neck is nontender. She has some bruising L>R anterior wrist.  Bones nontender. Skin intact She had small abrasions, very superficial to R>L knees.  These were cleaned, and  treated with bacitracin and bandage, after being iced during the visit. She is alert and oriented.  Normal strength. Gait was slow and cautious down the hall, at end of visit, but otherwise normal. Normal mood, affect, hygiene and grooming.   ASSESSMENT/PLAN:  Osteoporosis without current pathological fracture, unspecified osteoporosis type - discussed Ca, D, weight-bearing exercise. Discussed bisphosphonates and Prolia. Prefers Prolia. Risks/SE reviewed in detail. Will send to [facility] to start paperwk  Pure hypercholesterolemia - tolerating crestor without problems. return for fasting labs next month  Aortic atherosclerosis (HCC) - tolerating statin  Insomnia, unspecified type - improved. Discussed how to use Trazodone prn only  Fall, initial encounter - tripped in parking lot. superficial injuries as described in note.   Discussed treatment options for osteoporosis including bisphosphonates, Prolia, Evenity. She was concerned due to her h/o reflux, so prefers to start with Prolia (previously took bisphosphonates in the past). Discussed the need for  adequate intake of calcium, vitamin D and weight-bearing exercise.  F/u in July as scheduled, with fasting labs prior

## 2019-12-08 NOTE — Patient Instructions (Addendum)
We discussed various osteoporosis treatment options today, and decided to proceed with Prolia injections every 6 months (if affordable). Juliann Pulse will be looking into the coverage/cost, and should be reaching out to you in a couple of weeks.  If you haven't heard anything by early July, give our office a call and ask for [facility].    Osteoporosis  Osteoporosis is thinning and loss of density in your bones. Osteoporosis makes bones more brittle and fragile and more likely to break (fracture). Over time, osteoporosis can cause your bones to become so weak that they fracture after a minor fall. Bones in the hip, wrist, and spine are most likely to fracture due to osteoporosis. What are the causes? The exact cause of this condition is not known. What increases the risk? You may be at greater risk for osteoporosis if you:  Have a family history of the condition.  Have poor nutrition.  Use steroid medicines, such as prednisone.  Are female.  Are age 53 or older.  Smoke or have a history of smoking.  Are not physically active (are sedentary).  Are white (Caucasian) or of Asian descent.  Have a small body frame.  Take certain medicines, such as antiseizure medicines. What are the signs or symptoms? A fracture might be the first sign of osteoporosis, especially if the fracture results from a fall or injury that usually would not cause a bone to break. Other signs and symptoms include:  Pain in the neck or low back.  Stooped posture.  Loss of height. How is this diagnosed? This condition may be diagnosed based on:  Your medical history.  A physical exam.  A bone mineral density test, also called a DXA or DEXA test (dual-energy X-ray absorptiometry test). This test uses X-rays to measure the amount of minerals in your bones. How is this treated? The goal of treatment is to strengthen your bones and lower your risk for a fracture. Treatment may involve:  Making lifestyle changes,  such as: ? Including foods with more calcium and vitamin D in your diet. ? Doing weight-bearing and muscle-strengthening exercises. ? Stopping tobacco use. ? Limiting alcohol intake.  Taking medicine to slow the process of bone loss or to increase bone density.  Taking daily supplements of calcium and vitamin D.  Taking hormone replacement medicines, such as estrogen for women and testosterone for men.  Monitoring your levels of calcium and vitamin D. Follow these instructions at home:  Activity  Exercise as told by your health care provider. Ask your health care provider what exercises and activities are safe for you. You should do: ? Exercises that make you work against gravity (weight-bearing exercises), such as tai chi, yoga, or walking. ? Exercises to strengthen muscles, such as lifting weights. Lifestyle  Limit alcohol intake to no more than 1 drink a day for nonpregnant women and 2 drinks a day for men. One drink equals 12 oz of beer, 5 oz of wine, or 1 oz of hard liquor.  Do not use any products that contain nicotine or tobacco, such as cigarettes and e-cigarettes. If you need help quitting, ask your health care provider. Preventing falls  Use devices to help you move around (mobility aids) as needed, such as canes, walkers, scooters, or crutches.  Keep rooms well-lit and clutter-free.  Remove tripping hazards from walkways, including cords and throw rugs.  Install grab bars in bathrooms and safety rails on stairs.  Use rubber mats in the bathroom and other areas that are  often wet or slippery.  Wear closed-toe shoes that fit well and support your feet. Wear shoes that have rubber soles or low heels.  Review your medicines with your health care provider. Some medicines can cause dizziness or changes in blood pressure, which can increase your risk of falling. General instructions  Include calcium and vitamin D in your diet. Calcium is important for bone health, and  vitamin D helps your body to absorb calcium. Good sources of calcium and vitamin D include: ? Certain fatty fish, such as salmon and tuna. ? Products that have calcium and vitamin D added to them (fortified products), such as fortified cereals. ? Egg yolks. ? Cheese. ? Liver.  Take over-the-counter and prescription medicines only as told by your health care provider.  Keep all follow-up visits as told by your health care provider. This is important. Contact a health care provider if:  You have never been screened for osteoporosis and you are: ? A woman who is age 7 or older. ? A man who is age 104 or older. Get help right away if:  You fall or injure yourself. Summary  Osteoporosis is thinning and loss of density in your bones. This makes bones more brittle and fragile and more likely to break (fracture),even with minor falls.  The goal of treatment is to strengthen your bones and reduce your risk for a fracture.  Include calcium and vitamin D in your diet. Calcium is important for bone health, and vitamin D helps your body to absorb calcium.  Talk with your health care provider about screening for osteoporosis if you are a woman who is age 63 or older, or a man who is age 45 or older. This information is not intended to replace advice given to you by your health care provider. Make sure you discuss any questions you have with your health care provider. Document Revised: 05/24/2017 Document Reviewed: 04/05/2017 Elsevier Patient Education  2020 [employer].

## 2019-12-09 ENCOUNTER — Other Ambulatory Visit: Payer: Self-pay

## 2019-12-09 ENCOUNTER — Ambulatory Visit (INDEPENDENT_AMBULATORY_CARE_PROVIDER_SITE_OTHER): Payer: Medicare HMO | Admitting: Family Medicine

## 2019-12-09 ENCOUNTER — Encounter: Payer: Self-pay | Admitting: Family Medicine

## 2019-12-09 VITALS — BP 120/82 | HR 92 | Temp 99.1°F | Ht 60.0 in | Wt 153.0 lb

## 2019-12-09 DIAGNOSIS — I700 Atherosclerosis of aorta: Secondary | ICD-10-CM

## 2019-12-09 DIAGNOSIS — E7800 Pure hypercholesterolemia, unspecified: Secondary | ICD-10-CM | POA: Diagnosis not present

## 2019-12-09 DIAGNOSIS — W19XXXA Unspecified fall, initial encounter: Secondary | ICD-10-CM | POA: Diagnosis not present

## 2019-12-09 DIAGNOSIS — G4700 Insomnia, unspecified: Secondary | ICD-10-CM

## 2019-12-09 DIAGNOSIS — M810 Age-related osteoporosis without current pathological fracture: Secondary | ICD-10-CM

## 2019-12-10 ENCOUNTER — Other Ambulatory Visit: Payer: Self-pay | Admitting: Family Medicine

## 2019-12-10 ENCOUNTER — Ambulatory Visit (HOSPITAL_COMMUNITY)
Admission: RE | Admit: 2019-12-10 | Discharge: 2019-12-10 | Disposition: A | Discharge status: Home / Self Care | Payer: Medicare HMO | Source: Ambulatory Visit | Attending: Nurse Practitioner | Admitting: Nurse Practitioner

## 2019-12-10 VITALS — BP 144/66 | HR 74 | Ht 60.0 in | Wt 158.0 lb

## 2019-12-10 DIAGNOSIS — K219 Gastro-esophageal reflux disease without esophagitis: Secondary | ICD-10-CM | POA: Diagnosis not present

## 2019-12-10 DIAGNOSIS — I480 Paroxysmal atrial fibrillation: Secondary | ICD-10-CM

## 2019-12-10 DIAGNOSIS — M25561 Pain in right knee: Secondary | ICD-10-CM | POA: Diagnosis not present

## 2019-12-10 DIAGNOSIS — I4891 Unspecified atrial fibrillation: Secondary | ICD-10-CM | POA: Insufficient documentation

## 2019-12-10 DIAGNOSIS — Z79899 Other long term (current) drug therapy: Secondary | ICD-10-CM | POA: Diagnosis not present

## 2019-12-10 DIAGNOSIS — M25532 Pain in left wrist: Secondary | ICD-10-CM | POA: Diagnosis not present

## 2019-12-10 DIAGNOSIS — E785 Hyperlipidemia, unspecified: Secondary | ICD-10-CM | POA: Diagnosis not present

## 2019-12-10 DIAGNOSIS — Z7901 Long term (current) use of anticoagulants: Secondary | ICD-10-CM | POA: Diagnosis not present

## 2019-12-10 DIAGNOSIS — D6869 Other thrombophilia: Secondary | ICD-10-CM

## 2019-12-10 DIAGNOSIS — G4700 Insomnia, unspecified: Secondary | ICD-10-CM

## 2019-12-10 DIAGNOSIS — M810 Age-related osteoporosis without current pathological fracture: Secondary | ICD-10-CM | POA: Diagnosis not present

## 2019-12-10 DIAGNOSIS — M25562 Pain in left knee: Secondary | ICD-10-CM | POA: Diagnosis not present

## 2019-12-10 MED ORDER — METOPROLOL TARTRATE 25 MG PO TABS: 25.0000 mg | ORAL_TABLET | Freq: Two times a day (BID) | ORAL | 1 refills | Status: DC

## 2019-12-10 NOTE — Progress Notes (Signed)
Primary Care Physician: Rita Ohara, MD Referring Physician:self referred EP: [name] is a 86 y.o. female with a h/o paroxysmal afib that is in the afib clinic for increase of her afib. It will usually resolve with extra metoprolol. She was at the  physician's office yesterday and fell in the parking lot coming into the clinic. She has some  abrasions of her knee and hands and has a small fx of one of her wrist bones. She  has a f/u appointment to address that. She did not have any afib yesterday. She continues on eliquis 5 mg bid for a CHA2DS2VASc score of at least 3.   Today, she denies symptoms of palpitations, chest pain, shortness of breath, orthopnea, PND, lower extremity edema, dizziness, presyncope, syncope, or neurologic sequela. The patient is tolerating medications without difficulties and is otherwise without complaint today.   Past Medical History:  Diagnosis Date  . Arthritis   . Atherosclerosis of aorta ([name])    noted on chest CT 10/2015  . Atrial fibrillation ([place])    reported but not yet documented  . Baker cyst, left 06/2017   noted to have large, complex cyst on [place]  . Chronic back pain    Dr. Tonita Cong  . Chronic knee pain    Dr. Wynelle Link  . Closed compression fracture of body of L1 vertebra ([place]) 05/2009   vertebroplasty 06/02/2009  . Closed compression fracture of L3 lumbar vertebra, initial encounter ([place]) 05/2015   vertebroplasty 07/01/2015  . Colon polyp    Dr. Wynetta Emery  . Coronary atherosclerosis    noted on chest CT 10/2015  . Dysrhythmia    atrial fib- mild per pt- no meds  . Food allergy    Scallops--no other seafood allergies  . GERD (gastroesophageal reflux disease)   . Granuloma annulare 09/2016   Dr. Syble Creek  . Hiatal hernia    large, noted on EGD 01/14/13  . Hyperlipidemia   . Impaired glucose tolerance   . Knee hemarthrosis, left 03/2017  . Lactose intolerance   . Macular degeneration    bilateral  . Osteopenia   .  Osteoporosis    treated with Forteo in 2011 by Dr. Earlie Counts  . PVC's (premature ventricular contractions)   . Seasonal allergies   . 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 Tonita Cong, declines sx)  . Spinal stenosis    has had injections  . Vision problem    wears contacts   Past Surgical History:  Procedure Laterality Date  . BACK SURGERY     several vertbea ae rcemntekd  . CHOLECYSTECTOMY N/A 11/05/2018   Procedure: LAPAROSCOPIC CHOLECYSTECTOMY;  Surgeon: Clovis Riley, MD;  Location: [facility];  Service: General;  Laterality: N/A;  . ROBOTIC ASSISTED TOTAL HYSTERECTOMY WITH BILATERAL SALPINGO OOPHERECTOMY Bilateral 03/08/2016   Procedure: ROBOTIC ASSISTED TOTAL HYSTERECTOMY WITH BILATERAL SALPINGO OOPHORECTOMY/Uterosacral Ligament Suspension;  Surgeon: Princess Bruins, MD;  Location: [facility];  Service: Gynecology;  Laterality: Bilateral;  . sebaceous cyst excision  2009   neck  . TONSILLECTOMY AND ADENOIDECTOMY  age 73  . VERTEBROPLASTY  05/2009   L1  . VERTEBROPLASTY  07/01/2015   L3  . WISDOM TOOTH EXTRACTION      Current Outpatient Medications  Medication Sig Dispense Refill  . cholecalciferol (VITAMIN D3) 25 MCG (1000 UNIT) tablet Take 1,000 Units by mouth daily.    . Cyanocobalamin (VITAMIN B-12 PO)  Take 1 tablet by mouth daily.    Marland Kitchen ELIQUIS 5 MG TABS tablet TAKE 1 TABLET BY MOUTH TWICE A DAY 180 tablet 1  . fluticasone (FLONASE) 50 MCG/ACT nasal spray Place 1 spray into both nostrils daily as needed for allergies or rhinitis.    . metoprolol tartrate (LOPRESSOR) 25 MG tablet Take 1 tablet (25 mg total) by mouth 2 (two) times daily. 180 tablet 1  . Multiple Vitamin (MULTIVITAMIN WITH MINERALS) TABS tablet Take 1 tablet by mouth daily.    . Multiple Vitamins-Minerals (ICAPS AREDS 2 PO) Take 1 capsule by mouth 2 (two) times a day.    Marland Kitchen omeprazole (PRILOSEC) 20 MG capsule Take 20 mg by mouth daily.    .  rosuvastatin (CRESTOR) 10 MG tablet Take 1 tablet (10 mg total) by mouth daily. 30 tablet 2  . traZODone (DESYREL) 50 MG tablet Take 0.5-1 tablets (25-50 mg total) by mouth at bedtime. (Patient taking differently: Take 25-50 mg by mouth at bedtime as needed. ) 30 tablet 2   No current facility-administered medications for this encounter.    Allergies  Allergen Reactions  . Ciprofloxacin Shortness Of Breath and Rash  . Scallops [Shellfish Allergy] Anaphylaxis  . Demerol Other (See Comments)    Patient states it made her crazy and sleep for 12 hours  . Doxycycline Nausea And Vomiting  . Milk-Related Compounds Diarrhea  . Penicillins Other (See Comments)    Did it involve swelling of the face/tongue/throat, SOB, or low BP? Unknown Did it involve sudden or severe rash/hives, skin peeling, or any reaction on the inside of your mouth or nose? Unknown Did you need to seek medical attention at a hospital or doctor's office? Unknown When did it last happen? If all above answers are "NO", may proceed with cephalosporin use.   . Rivaroxaban Other (See Comments)    Pt unsure but d/c use  . Sudafed [Pseudoephedrine Hcl] Other (See Comments)    hyperactive    Social History   Socioeconomic History  . Marital status: Married    Spouse name: Not on file  . Number of children: 2  . Years of education: Not on file  . Highest education level: Not on file  Occupational History  . Occupation: retired (ran [employer] with her husband)  Tobacco Use  . Smoking status: Never Smoker  . Smokeless tobacco: Never Used  Vaping Use  . Vaping Use: Never used  Substance and Sexual Activity  . Alcohol use: No    Comment: maybe one glass per year.  . Drug use: No  . Sexual activity: Not Currently  Other Topics Concern  . Not on file  Social History Narrative   Widowed, lives alone.  No pets.  Daughter in [place], [place]; Daughter in [place].    Social Determinants of Health   Financial Resource  Strain:   . Difficulty of Paying Living Expenses:   Food Insecurity:   . Worried About Charity fundraiser in the Last Year:   . Arboriculturist in the Last Year:   Transportation Needs:   . Film/video editor (Medical):   Marland Kitchen Lack of Transportation (Non-Medical):   Physical Activity:   . Days of Exercise per Week:   . Minutes of Exercise per Session:   Stress:   . Feeling of Stress :   Social Connections:   . Frequency of Communication with Friends and Family:   . Frequency of Social Gatherings with Friends and Family:   .  Attends Religious Services:   . Active Member of Clubs or Organizations:   . Attends Archivist Meetings:   Marland Kitchen Marital Status:   Intimate Partner Violence:   . Fear of Current or Ex-Partner:   . Emotionally Abused:   Marland Kitchen Physically Abused:   . Sexually Abused:     Family History  Problem Relation Age of Onset  . Rheumatic fever Mother   . Diabetes Mother   . Heart disease Mother        rheumatic heart disease  . Cancer Sister 85       breast cancer  . Cancer Brother        bladder and prostate cancer  . Migraines Daughter   . Cancer Sister        leukemia at 53; colon cancer in her late 30's--family ?'s dx    ROS- All systems are reviewed and negative except as per the HPI above  Physical Exam: Vitals:   12/10/19 1445  BP: (!) 144/66  Pulse: 74  Weight: 71.7 kg  Height: 5' (1.524 m)   Wt Readings from Last 3 Encounters:  12/10/19 71.7 kg  12/09/19 69.4 kg  11/16/19 71.1 kg    Labs: Lab Results  Component Value Date   NA 140 09/16/2019   K 4.2 09/16/2019   CL 101 09/16/2019   CO2 26 09/16/2019   GLUCOSE 124 (H) 09/16/2019   BUN 15 09/16/2019   CREATININE 0.81 09/16/2019   CALCIUM 9.7 09/16/2019   PHOS 2.4 (L) 11/02/2018   MG 2.0 11/06/2018   Lab Results  Component Value Date   INR 1.3 (A) 11/13/2018   Lab Results  Component Value Date   CHOL 209 (H) 05/26/2012   HDL 46 05/26/2012   LDLCALC 145 (H) 05/26/2012     TRIG 88 05/26/2012     GEN- The patient is well appearing, alert and oriented x 3 today.   Head- normocephalic, atraumatic Eyes-  Sclera clear, conjunctiva pink Ears- hearing intact Oropharynx- clear Neck- supple, no JVP Lymph- no cervical lymphadenopathy Lungs- Clear to ausculation bilaterally, normal work of breathing Heart- Regular rate and rhythm, no murmurs, rubs or gallops, PMI not laterally displaced GI- soft, NT, ND, + BS Extremities- no clubbing, cyanosis, or edema MS- no significant deformity or atrophy Skin- no rash or lesion Psych- euthymic mood, full affect Neuro- strength and sensation are intact  EKG-NSR at 74 bpm, pr int 156 ms, qrs int 78 ms, qtc 419 ms    Assessment and Plan: 1. Afib Increase in frequency Usually will convert easily  with extra metoprolol Since she is just on 12.5 mg of metoprolol bid will increase to one full tab bid as she has enough HR/BP to handle this  Pt is pleased with this approach  She can still take 1/2 tab to full tab for breakthrough afib  2. CHA2DS2VASc score of at least 3 Continue eliquis 5 mg bid  F/u with the afib clinic as needed   Butch Penny C. Chantelle Verdi, [facility] [address] [place], [place] [address] [phone]

## 2019-12-11 DIAGNOSIS — H353211 Exudative age-related macular degeneration, right eye, with active choroidal neovascularization: Secondary | ICD-10-CM | POA: Diagnosis not present

## 2019-12-11 DIAGNOSIS — H353122 Nonexudative age-related macular degeneration, left eye, intermediate dry stage: Secondary | ICD-10-CM | POA: Diagnosis not present

## 2019-12-24 ENCOUNTER — Telehealth: Payer: Self-pay | Admitting: Internal Medicine

## 2019-12-24 NOTE — Telephone Encounter (Signed)
I spoke with patient's daughter.  She reports patient has been having more frequent episodes of afib over the last 3-4 weeks.  Happens pretty much every day.  Patient is aware of irregular heart beat when she goes into afib. Also some shortness of breath with this.  Taking Eliquis and metoprolol as listed.  Per last office note I told daughter patient could take extra half tab to full tab of metoprolol if needed. Patient will see Oda Kilts, PA tomorrow as scheduled.

## 2019-12-24 NOTE — Telephone Encounter (Signed)
Patient c/o Palpitations:  High priority if patient c/o lightheadedness, shortness of breath, or chest pain  1) How long have you had palpitations/irregular HR/ Afib? Are you having the symptoms now? once a day or every other day for 2 - 3 hours, yes  2) Are you currently experiencing lightheadedness, SOB or CP? no  3) Do you have a history of afib (atrial fibrillation) or irregular heart rhythm? afib  4) Have you checked your BP or HR? (document readings if available): HR 90-137  5) Are you experiencing any other symptoms? No   Patient's daughter calling stating the patient is currently in afib. She  is currently at work and not with the patient. The patient is scheduled to see Oda Kilts tomorrow 12/25/2019.

## 2019-12-25 ENCOUNTER — Encounter: Payer: Self-pay | Admitting: Student

## 2019-12-25 ENCOUNTER — Other Ambulatory Visit: Payer: Self-pay

## 2019-12-25 ENCOUNTER — Ambulatory Visit: Payer: Medicare HMO | Admitting: Student

## 2019-12-25 VITALS — BP 122/80 | HR 94 | Ht 61.0 in | Wt 159.0 lb

## 2019-12-25 DIAGNOSIS — I480 Paroxysmal atrial fibrillation: Secondary | ICD-10-CM | POA: Diagnosis not present

## 2019-12-25 DIAGNOSIS — Z5181 Encounter for therapeutic drug level monitoring: Secondary | ICD-10-CM | POA: Diagnosis not present

## 2019-12-25 MED ORDER — FLECAINIDE ACETATE 50 MG PO TABS
50.0000 mg | ORAL_TABLET | Freq: Two times a day (BID) | ORAL | 3 refills | Status: DC
Start: 2019-12-25 — End: 2020-02-03

## 2019-12-25 NOTE — Progress Notes (Signed)
PCP:  Samantha Ohara, MD Primary Cardiologist: No primary care provider on file. Electrophysiologist: Samantha Axe, MD   Samantha [facility] is a 86 y.o. female seen today for Samantha Axe, MD for routine electrophysiology followup.  Since last being seen in our clinic the patient reports an increase in AF. She was seen in AF clinic 12/10/2019. She typically converts into NSR "easily" with an extra dose metoprolol, but has had an increased burden over the past 5 weeks. She is having it almost daily, for up to 3-5 hours at a time. The palpitations also occasionally awaken her from sleep. No exertional chest pain, and has had several normal stress tests (most recent 2017). When she is in AF she feels wiped out and SOB. She denies syncope or near syncope. She does have some dizziness when in AF.    Past Medical History:  Diagnosis Date   Arthritis    Atherosclerosis of aorta ([facility])    noted on chest CT 10/2015   Atrial fibrillation ([facility])    reported but not yet documented   Baker cyst, left 06/2017   noted to have large, complex cyst on [place]   Chronic back pain    Dr. Tonita [facility]   Chronic knee pain    Dr. Wynelle [facility]   Closed compression fracture of body of L1 vertebra ([facility]) 05/2009   vertebroplasty 06/02/2009   Closed compression fracture of L3 lumbar vertebra, initial encounter ([facility]) 05/2015   vertebroplasty 07/01/2015   Colon polyp    Dr. Wynetta [facility]   Coronary atherosclerosis    noted on chest CT 10/2015   Dysrhythmia    atrial fib- mild per pt- no meds   Food allergy    Scallops--no other seafood allergies   GERD (gastroesophageal reflux disease)    Granuloma annulare 09/2016   Dr. Syble [facility]   Hiatal hernia    large, noted on EGD 01/14/13   Hyperlipidemia    Impaired glucose tolerance    Knee hemarthrosis, left 03/2017   Lactose intolerance    Macular degeneration    bilateral   Osteopenia    Osteoporosis    treated with Forteo in 2011 by Dr. 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 Tonita [facility], declines sx)   Spinal stenosis    has had injections   Vision problem    wears contacts   Past Surgical History:  Procedure Laterality Date   BACK SURGERY     several vertbea ae rcemntekd   CHOLECYSTECTOMY N/A 11/05/2018   Procedure: LAPAROSCOPIC CHOLECYSTECTOMY;  Surgeon: Samantha Riley, MD;  Location: [facility];  Service: General;  Laterality: N/A;   ROBOTIC ASSISTED TOTAL HYSTERECTOMY WITH BILATERAL SALPINGO OOPHERECTOMY Bilateral 03/08/2016   Procedure: ROBOTIC ASSISTED TOTAL HYSTERECTOMY WITH BILATERAL SALPINGO OOPHORECTOMY/Uterosacral Ligament Suspension;  Surgeon: Samantha Bruins, MD;  Location: [facility];  Service: Gynecology;  Laterality: Bilateral;   sebaceous cyst excision  2009   neck   TONSILLECTOMY AND ADENOIDECTOMY  age 83   VERTEBROPLASTY  05/2009   L1   VERTEBROPLASTY  07/01/2015   L3   WISDOM TOOTH EXTRACTION      Current Outpatient Medications  Medication Sig Dispense Refill   cholecalciferol (VITAMIN D3) 25 MCG (1000 UNIT) tablet Take 1,000 Units by mouth daily.     Cyanocobalamin (VITAMIN B-12 PO) Take 1 tablet by mouth daily.  ELIQUIS 5 MG TABS tablet TAKE 1 TABLET BY MOUTH TWICE A DAY 180 tablet 1   fluticasone (FLONASE) 50 MCG/ACT nasal spray Place 1 spray into both nostrils daily as needed for allergies or rhinitis.     metoprolol tartrate (LOPRESSOR) 25 MG tablet Take 1 tablet (25 mg total) by mouth 2 (two) times daily. 180 tablet 1   Multiple Vitamin (MULTIVITAMIN WITH MINERALS) TABS tablet Take 1 tablet by mouth daily.     Multiple Vitamins-Minerals (ICAPS AREDS 2 PO) Take 1 capsule by mouth 2 (two) times a day.     omeprazole (PRILOSEC) 20 MG capsule Take 20 mg by mouth daily.     rosuvastatin (CRESTOR) 10 MG tablet Take 1 tablet (10 mg total) by mouth daily. 30  tablet 2   traZODone (DESYREL) 50 MG tablet Take 50 mg by mouth as needed for sleep. 25-50 at bedtime     No current facility-administered medications for this visit.    Allergies  Allergen Reactions   Ciprofloxacin Shortness Of Breath and Rash   Scallops [Shellfish Allergy] Anaphylaxis   Demerol Other (See Comments)    Patient states it made her crazy and sleep for 12 hours   Doxycycline Nausea And Vomiting   Milk-Related Compounds Diarrhea   Penicillins Other (See Comments)    Did it involve swelling of the face/tongue/throat, SOB, or low BP? Unknown Did it involve sudden or severe rash/hives, skin peeling, or any reaction on the inside of your mouth or nose? Unknown Did you need to seek medical attention at a hospital or doctor's office? Unknown When did it last happen? If all above answers are NO, may proceed with cephalosporin use.    Rivaroxaban Other (See Comments)    Pt unsure but d/c use   Sudafed [Pseudoephedrine Hcl] Other (See Comments)    hyperactive    Social History   Socioeconomic History   Marital status: Married    Spouse name: Not on file   Number of children: 2   Years of education: Not on file   Highest education level: Not on file  Occupational History   Occupation: retired (ran [employer] with her husband)  Tobacco Use   Smoking status: Never Smoker   Smokeless tobacco: Never Used  Scientific laboratory technician Use: Never used  Substance and Sexual Activity   Alcohol use: No    Comment: maybe one glass per year.   Drug use: No   Sexual activity: Not Currently  Other Topics Concern   Not on file  Social History Narrative   Widowed, lives alone.  No pets.  Daughter in [place], [place]; Daughter in [place].    Social Determinants of Health   Financial Resource Strain:    Difficulty of Paying Living Expenses:   Food Insecurity:    Worried About Charity fundraiser in the Last Year:    Arboriculturist in the Last Year:     Transportation Needs:    Film/video editor (Medical):    Lack of Transportation (Non-Medical):   Physical Activity:    Days of Exercise per Week:    Minutes of Exercise per Session:   Stress:    Feeling of Stress :   Social Connections:    Frequency of Communication with Friends and Family:    Frequency of Social Gatherings with Friends and Family:    Attends Religious Services:    Active Member of Clubs or Organizations:    Attends Archivist  Meetings:    Marital Status:   Intimate Partner Violence:    Fear of Current or Ex-Partner:    Emotionally Abused:    Physically Abused:    Sexually Abused:    Review of Systems: All other systems reviewed and are otherwise negative except as noted above.  Physical Exam: Vitals:   12/25/19 1038  BP: 122/80  Pulse: 94  SpO2: 97%  Weight: 159 lb (72.1 kg)  Height: 5\' 1"  (1.549 m)    GEN- The patient is well appearing, alert and oriented x 3 today.   HEENT: normocephalic, atraumatic; sclera clear, conjunctiva pink; hearing intact; oropharynx clear; neck supple, no JVP Lymph- no cervical lymphadenopathy Lungs- Clear to ausculation bilaterally, normal work of breathing.  No wheezes, rales, rhonchi Heart- Regular rate and rhythm, no murmurs, rubs or gallops, PMI not laterally displaced GI- soft, non-tender, non-distended, bowel sounds present, no hepatosplenomegaly Extremities- no clubbing, cyanosis, or edema; DP/PT/radial pulses 2+ bilaterally MS- no significant deformity or atrophy Skin- warm and dry, no rash or lesion Psych- euthymic mood, full affect Neuro- strength and sensation are intact  EKG is not ordered. EKG from 12/10/19 personally reviewed. Showed NSR at 74 bpm, PR interval 156 ms, QRS 78 ms  Additional studies reviewed include: Previous EP office notes, Previous myoviews, Echos, Previous AF clinic notes  Assessment and Plan:  1. Paroxysmal atrial fibrillation These are increasing in  frequency and severity and she would like to consider other medical therapies.  She has normal EF and no known CAD.  Will start with flecainide 50 mg BID. Can titrate as needed/tolerated Repeat EKG 7-10 days.  Continue current dose lopressor.  She would be candidate for tikosyn or amiodarone if fails flecainide Will have follow up in AF clinic in 1 month post flecainide start Baseline labs 09/16/2019 stable. Continue eliquis for CHA2DS2VASC of 4. She has not missed any doses    2. High risk medication surveillance, Eliquis Denies bleeding. Recent CBC stable.  Shirley Friar, PA-C  12/25/19 10:47 AM

## 2019-12-25 NOTE — Patient Instructions (Signed)
Medication Instructions:  Your physician has recommended you make the following change in your medication:  -- START Flecainide 50 mg - Take 1 tablet (50 mg) by mouth twice daily--  *If you need a refill on your cardiac medications before your next appointment, please call your pharmacy*  Lab Work: If you have labs (blood work) drawn today and your tests are completely normal, you will receive your results only by: Marland Kitchen MyChart Message (if you have MyChart) OR . A paper copy in the mail If you have any lab test that is abnormal or we need to change your treatment, we will call you to review the results.  Follow-Up: At [facility], you and your health needs are our priority.  As part of our continuing mission to provide you with exceptional heart care, we have created designated Provider Care Teams.  These Care Teams include your primary Cardiologist (physician) and Advanced Practice Providers (APPs -  Physician Assistants and Nurse Practitioners) who all work together to provide you with the care you need, when you need it.  We recommend signing up for the patient portal called "MyChart".  Sign up information is provided on this After Visit Summary.  MyChart is used to connect with patients for Virtual Visits (Telemedicine).  Patients are able to view lab/test results, encounter notes, upcoming appointments, etc.  Non-urgent messages can be sent to your provider as well.   To learn more about what you can do with MyChart, go to [email or web address].    Your next appointment:   Your physician recommends that you schedule a follow-up appointment in: 7-10 days for a Nurse Visit - EKG  Your physician recommends that you schedule a follow-up appointment in: 1 MONTH with the [facility] Clinic  The format for your next appointment:   In Person with [facility]

## 2019-12-31 ENCOUNTER — Telehealth: Payer: Self-pay | Admitting: Family Medicine

## 2019-12-31 NOTE — Telephone Encounter (Signed)
Left message for pt to call concerning Prolia .  

## 2019-12-31 NOTE — Telephone Encounter (Signed)
Pt returned my call concerning Prolia. I informed her that her estimated cost will be $255. I explained about [employer] being out of funds now but could be an option for her next injection. She states that she is having a shot for her eye that is going to cost her $600. She states she wants to make sure that this is the best option for her taking the cost in consideration. Please advise me and I will call pt and inform.

## 2020-01-01 NOTE — Telephone Encounter (Signed)
I spoke with patient, and discussed the alternatives (going back to a bisphosphonate). Also discussed the foundation, and how potentially she could get coverage in the future.  She definitely prefers the convenience of a shot twice a year. She has appointments on the 26th and 28th in our office (one for labs and one with me), and given that she isn't driving, would prefer to set up to have the Prolia injection on one of those days, when she would already be here.  She is okay with paying the estimated $255.

## 2020-01-01 NOTE — Telephone Encounter (Signed)
Will call pt and set it up.

## 2020-01-05 ENCOUNTER — Other Ambulatory Visit: Payer: Self-pay

## 2020-01-05 ENCOUNTER — Ambulatory Visit (INDEPENDENT_AMBULATORY_CARE_PROVIDER_SITE_OTHER): Payer: Medicare HMO

## 2020-01-05 VITALS — HR 54 | Ht 61.0 in

## 2020-01-05 DIAGNOSIS — I480 Paroxysmal atrial fibrillation: Secondary | ICD-10-CM | POA: Diagnosis not present

## 2020-01-05 DIAGNOSIS — Z5181 Encounter for therapeutic drug level monitoring: Secondary | ICD-10-CM | POA: Diagnosis not present

## 2020-01-05 NOTE — Progress Notes (Signed)
1.) Reason for visit: EKG after starting flecainide   2.) Name of MD requesting visit: Oda Kilts, PA-C  3.) Assessment and plan per MD: EKG reviewed by Dr. Caryl Comes. No changes.

## 2020-01-15 DIAGNOSIS — H353122 Nonexudative age-related macular degeneration, left eye, intermediate dry stage: Secondary | ICD-10-CM | POA: Diagnosis not present

## 2020-01-15 DIAGNOSIS — Z961 Presence of intraocular lens: Secondary | ICD-10-CM | POA: Diagnosis not present

## 2020-01-15 DIAGNOSIS — D3132 Benign neoplasm of left choroid: Secondary | ICD-10-CM | POA: Diagnosis not present

## 2020-01-15 DIAGNOSIS — H353211 Exudative age-related macular degeneration, right eye, with active choroidal neovascularization: Secondary | ICD-10-CM | POA: Diagnosis not present

## 2020-01-15 DIAGNOSIS — Z9889 Other specified postprocedural states: Secondary | ICD-10-CM | POA: Diagnosis not present

## 2020-01-18 ENCOUNTER — Other Ambulatory Visit: Payer: Self-pay

## 2020-01-18 ENCOUNTER — Telehealth: Payer: Self-pay | Admitting: Family Medicine

## 2020-01-18 ENCOUNTER — Other Ambulatory Visit: Payer: Medicare HMO

## 2020-01-18 DIAGNOSIS — M8580 Other specified disorders of bone density and structure, unspecified site: Secondary | ICD-10-CM

## 2020-01-18 MED ORDER — DENOSUMAB 60 MG/ML ~~LOC~~ SOSY
60.0000 mg | PREFILLED_SYRINGE | Freq: Once | SUBCUTANEOUS | Status: AC
  Administered 2020-07-28: 60 mg via SUBCUTANEOUS

## 2020-01-18 NOTE — Telephone Encounter (Signed)
Sorry--I JUST got the Prolia order to co-sign, so realize that she came today for her injection. Unfortunate that couldn't have been given when she had labs done to not have to come so many times...  Ignore my question on previous message, but please make sure it is well documented so that the nurses know to get her vitals when she comes for fasting labs.  Thanks  I'm going to cc this message to the [facility] here tomorrow so they see to please get vitals. Put them on stickie note and leave it on Veronica's desk so we have for the virtual visit Wednesday.

## 2020-01-18 NOTE — Telephone Encounter (Signed)
Pts daughter Maudie Mercury called and wanted to see if Mrs Gwyndolyn med check appt on wed can be virtual instead of in person. Pt coming back tomorrow morning for fasting labs

## 2020-01-18 NOTE — Telephone Encounter (Signed)
Yes, can be virtual.  Please note on her lab visit that she needs full set of [place] Verdene Lennert will not be here--CMA's in office need to know to get the vital signs--and document them somewhere so that I have them available for her virtual visit.)  Is she getting her Prolia tomorrow also?

## 2020-01-19 ENCOUNTER — Other Ambulatory Visit: Payer: Medicare HMO

## 2020-01-19 DIAGNOSIS — Z5181 Encounter for therapeutic drug level monitoring: Secondary | ICD-10-CM | POA: Diagnosis not present

## 2020-01-19 DIAGNOSIS — E7800 Pure hypercholesterolemia, unspecified: Secondary | ICD-10-CM | POA: Diagnosis not present

## 2020-01-19 NOTE — Telephone Encounter (Signed)
Vitals done [employer]

## 2020-01-19 NOTE — Telephone Encounter (Signed)
It was scheduled that she was to have injection and labs. I'm not sure what happened and why labs did not get done. I scheduled it that way so she wouldn't have to come in so many times.

## 2020-01-19 NOTE — Progress Notes (Signed)
Start time: 4:16 End time: 4:28  Virtual Visit via Telephone Note  I connected with Samantha Foley on 01/20/2020 by telephone (she had no access to video connection), and verified that I am speaking with the correct person using two identifiers.  Location: Patient: home Provider: office   I discussed the limitations of evaluation and management by telemedicine and the availability of in person appointments. The patient expressed understanding and agreed to proceed.  History of Present Illness:  Chief Complaint  Patient presents with  . Hyperlipidemia    PHONE ONLY nonfasting med check, labs already.done. No new concerns.    Patient presents for follow-up labs. She received her first Prolia injection earlier this week, and tolerated without side effects.  Earlier this month she had noticed increase in afib.  She saw cardiologist and was started on Flecainide. She is doing much better, and "I have felt fabulous".  She was started on Crestor in May, due to aortic atherosclerosis (noted on chest CT 2017) and hyperlipidemia. last lipids: 01/2018 TC 241, LDL 163, TG 131, HDL 51 She reports compliance with crestor, and denies side effect.  Insomnia, intermittent--she has good nights and bad nights. Not playing bridge yet due to not driving  (due to her macular degeneration). Last injection was Friday, and caused discomfort afterwards, with some bleeding.  That was the only injection she had problems with.  It is feeling better now. Eyesight is getting better, and glasses have helped.  "I feel good", moods are better.  She had tripped and fallen at her last OV here--she denies any further falls.  PMH, PSH, SH reviewed  Outpatient Encounter Medications as of 01/20/2020  Medication Sig  . cholecalciferol (VITAMIN D3) 25 MCG (1000 UNIT) tablet Take 1,000 Units by mouth daily.  . Cyanocobalamin (VITAMIN B-12 PO) Take 1 tablet by mouth daily.  Marland Kitchen ELIQUIS 5 MG TABS tablet TAKE 1 TABLET  BY MOUTH TWICE A DAY  . flecainide (TAMBOCOR) 50 MG tablet Take 1 tablet (50 mg total) by mouth 2 (two) times daily.  . fluticasone (FLONASE) 50 MCG/ACT nasal spray Place 1 spray into both nostrils daily as needed for allergies or rhinitis.  . metoprolol tartrate (LOPRESSOR) 25 MG tablet Take 1 tablet (25 mg total) by mouth 2 (two) times daily.  . Multiple Vitamin (MULTIVITAMIN WITH MINERALS) TABS tablet Take 1 tablet by mouth daily.  . Multiple Vitamins-Minerals (ICAPS AREDS 2 PO) Take 1 capsule by mouth 2 (two) times a day.  Marland Kitchen omeprazole (PRILOSEC) 20 MG capsule Take 20 mg by mouth daily.  . rosuvastatin (CRESTOR) 10 MG tablet Take 1 tablet (10 mg total) by mouth daily.  . traZODone (DESYREL) 50 MG tablet Take 50 mg by mouth as needed for sleep. 25-50 at bedtime  . [DISCONTINUED] rosuvastatin (CRESTOR) 10 MG tablet Take 1 tablet (10 mg total) by mouth daily.   Facility-Administered Encounter Medications as of 01/20/2020  Medication  . denosumab (PROLIA) injection 60 mg   Allergies  Allergen Reactions  . Ciprofloxacin Shortness Of Breath and Rash  . Scallops [Shellfish Allergy] Anaphylaxis  . Demerol Other (See Comments)    Patient states it made her crazy and sleep for 12 hours  . Doxycycline Nausea And Vomiting  . Milk-Related Compounds Diarrhea  . Penicillins Other (See Comments)    Did it involve swelling of the face/tongue/throat, SOB, or low BP? Unknown Did it involve sudden or severe rash/hives, skin peeling, or any reaction on the inside of your mouth or nose?  Unknown Did you need to seek medical attention at a hospital or doctor's office? Unknown When did it last happen? If all above answers are "NO", may proceed with cephalosporin use.   . Rivaroxaban Other (See Comments)    Pt unsure but d/c use  . Sudafed [Pseudoephedrine Hcl] Other (See Comments)    hyperactive   ROS: no fever, chills, URI symptoms, chest pain.  afib improved since med change. No shortness of  breath. No GI complaints, myalgias.  Moods are good. Intermittent insomnia.  Vision improving slowly See HPI   Observations/Objective:  BP 118/70   Pulse 73   Ht 5' 1.5" (1.562 m)   Wt 158 lb (71.7 kg)   BMI 29.37 kg/m   Wt Readings from Last 3 Encounters:  01/19/20 158 lb (71.7 kg)  12/25/19 159 lb (72.1 kg)  12/10/19 158 lb (71.7 kg)   Patient is alert, oriented, and in good spirits. She has normal speech and memory. Exam is limited due to virtual nature of the visit.   Lab Results  Component Value Date   CHOL 146 01/19/2020   HDL 50 01/19/2020   LDLCALC 77 01/19/2020   TRIG 101 01/19/2020   CHOLHDL 2.9 01/19/2020   Fasting glucose 90    Chemistry      Component Value Date/Time   NA 142 01/19/2020 1008   K 4.5 01/19/2020 1008   CL 102 01/19/2020 1008   CO2 27 01/19/2020 1008   BUN 11 01/19/2020 1008   CREATININE 0.79 01/19/2020 1008   CREATININE 0.78 11/23/2012 0907      Component Value Date/Time   CALCIUM 9.3 01/19/2020 1008   ALKPHOS 108 01/19/2020 1008   AST 21 01/19/2020 1008   ALT 15 01/19/2020 1008   BILITOT 0.6 01/19/2020 1008      Assessment and Plan:  Pure hypercholesterolemia - improved on Crestor, continue  Osteoporosis without current pathological fracture, unspecified osteoporosis type - tolerated first dose of Prolia earlier this week, continue q6 mos  Aortic atherosclerosis (HCC) - cont statin - Plan: rosuvastatin (CRESTOR) 10 MG tablet  Paroxysmal atrial fibrillation (HCC) - improved since cardiologist added flecainide   Crestor #90 x 1 RF [facility]  6 months--CPE/AWV, FASTING    Follow Up Instructions:    I discussed the assessment and treatment plan with the patient. The patient was provided an opportunity to ask questions and all were answered. The patient agreed with the plan and demonstrated an understanding of the instructions.   The patient was advised to call back or seek an in-person evaluation if the  symptoms worsen or if the condition fails to improve as anticipated.  I provided 12 minutes of direct telephone time during this encounter. Additional time was spent in chart review and documentation.   Vikki Ports, MD

## 2020-01-20 ENCOUNTER — Encounter: Payer: Medicare HMO | Admitting: Family Medicine

## 2020-01-20 ENCOUNTER — Telehealth (INDEPENDENT_AMBULATORY_CARE_PROVIDER_SITE_OTHER): Payer: Medicare HMO | Admitting: Family Medicine

## 2020-01-20 ENCOUNTER — Other Ambulatory Visit: Payer: Self-pay

## 2020-01-20 ENCOUNTER — Encounter: Payer: Self-pay | Admitting: Family Medicine

## 2020-01-20 VITALS — BP 118/70 | HR 73 | Ht 61.5 in | Wt 158.0 lb

## 2020-01-20 DIAGNOSIS — E7800 Pure hypercholesterolemia, unspecified: Secondary | ICD-10-CM | POA: Diagnosis not present

## 2020-01-20 DIAGNOSIS — M810 Age-related osteoporosis without current pathological fracture: Secondary | ICD-10-CM | POA: Diagnosis not present

## 2020-01-20 DIAGNOSIS — I480 Paroxysmal atrial fibrillation: Secondary | ICD-10-CM

## 2020-01-20 DIAGNOSIS — I700 Atherosclerosis of aorta: Secondary | ICD-10-CM | POA: Diagnosis not present

## 2020-01-20 LAB — COMPREHENSIVE METABOLIC PANEL
ALT: 15 IU/L (ref 0–32)
AST: 21 IU/L (ref 0–40)
Albumin/Globulin Ratio: 1.6 (ref 1.2–2.2)
Albumin: 4.2 g/dL (ref 3.6–4.6)
Alkaline Phosphatase: 108 IU/L (ref 48–121)
BUN/Creatinine Ratio: 14 (ref 12–28)
BUN: 11 mg/dL (ref 8–27)
Bilirubin Total: 0.6 mg/dL (ref 0.0–1.2)
CO2: 27 mmol/L (ref 20–29)
Calcium: 9.3 mg/dL (ref 8.7–10.3)
Chloride: 102 mmol/L (ref 96–106)
Creatinine, Ser: 0.79 mg/dL (ref 0.57–1.00)
GFR calc Af Amer: 78 mL/min/{1.73_m2} (ref >59)
GFR calc non Af Amer: 68 mL/min/{1.73_m2} (ref >59)
Globulin, Total: 2.6 g/dL (ref 1.5–4.5)
Glucose: 90 mg/dL (ref 65–99)
Potassium: 4.5 mmol/L (ref 3.5–5.2)
Sodium: 142 mmol/L (ref 134–144)
Total Protein: 6.8 g/dL (ref 6.0–8.5)

## 2020-01-20 LAB — LIPID PANEL
Chol/HDL Ratio: 2.9 ratio (ref 0.0–4.4)
Cholesterol, Total: 146 mg/dL (ref 100–199)
HDL: 50 mg/dL (ref >39)
LDL Chol Calc (NIH): 77 mg/dL (ref 0–99)
Triglycerides: 101 mg/dL (ref 0–149)
VLDL Cholesterol Cal: 19 mg/dL (ref 5–40)

## 2020-01-20 MED ORDER — ROSUVASTATIN CALCIUM 10 MG PO TABS: 10.0000 mg | ORAL_TABLET | Freq: Every day | ORAL | 1 refills | Status: DC

## 2020-01-20 NOTE — Patient Instructions (Signed)
Continue your current medications. Your cholesterol is excellent on the Crestor! I sent a 90 day supply to the [facility], with a refill, which should last until your next visit.  Someone will be calling you to schedule your annual Wellness Visit and physical in 6 months. Let us know if you need anything before then!

## 2020-01-21 ENCOUNTER — Ambulatory Visit (HOSPITAL_COMMUNITY): Payer: Medicare HMO | Admitting: Nurse Practitioner

## 2020-02-03 ENCOUNTER — Other Ambulatory Visit: Payer: Self-pay

## 2020-02-03 ENCOUNTER — Ambulatory Visit (HOSPITAL_COMMUNITY)
Admission: RE | Admit: 2020-02-03 | Discharge: 2020-02-03 | Disposition: A | Discharge status: Home / Self Care | Payer: Medicare HMO | Source: Ambulatory Visit | Attending: Nurse Practitioner | Admitting: Nurse Practitioner

## 2020-02-03 ENCOUNTER — Encounter (HOSPITAL_COMMUNITY): Payer: Self-pay | Admitting: Nurse Practitioner

## 2020-02-03 VITALS — BP 126/64 | HR 68 | Ht 60.0 in | Wt 159.4 lb

## 2020-02-03 DIAGNOSIS — M1990 Unspecified osteoarthritis, unspecified site: Secondary | ICD-10-CM | POA: Insufficient documentation

## 2020-02-03 DIAGNOSIS — D6869 Other thrombophilia: Secondary | ICD-10-CM | POA: Diagnosis not present

## 2020-02-03 DIAGNOSIS — I493 Ventricular premature depolarization: Secondary | ICD-10-CM | POA: Diagnosis not present

## 2020-02-03 DIAGNOSIS — Z7901 Long term (current) use of anticoagulants: Secondary | ICD-10-CM | POA: Insufficient documentation

## 2020-02-03 DIAGNOSIS — I2510 Atherosclerotic heart disease of native coronary artery without angina pectoris: Secondary | ICD-10-CM | POA: Diagnosis not present

## 2020-02-03 DIAGNOSIS — K219 Gastro-esophageal reflux disease without esophagitis: Secondary | ICD-10-CM | POA: Insufficient documentation

## 2020-02-03 DIAGNOSIS — M4800 Spinal stenosis, site unspecified: Secondary | ICD-10-CM | POA: Diagnosis not present

## 2020-02-03 DIAGNOSIS — T44995A Adverse effect of other drug primarily affecting the autonomic nervous system, initial encounter: Secondary | ICD-10-CM | POA: Diagnosis not present

## 2020-02-03 DIAGNOSIS — L272 Dermatitis due to ingested food: Secondary | ICD-10-CM | POA: Diagnosis not present

## 2020-02-03 DIAGNOSIS — E785 Hyperlipidemia, unspecified: Secondary | ICD-10-CM | POA: Diagnosis not present

## 2020-02-03 DIAGNOSIS — H3530 Unspecified macular degeneration: Secondary | ICD-10-CM | POA: Diagnosis not present

## 2020-02-03 DIAGNOSIS — Z79899 Other long term (current) drug therapy: Secondary | ICD-10-CM | POA: Diagnosis not present

## 2020-02-03 DIAGNOSIS — T368X5A Adverse effect of other systemic antibiotics, initial encounter: Secondary | ICD-10-CM | POA: Insufficient documentation

## 2020-02-03 DIAGNOSIS — T364X5A Adverse effect of tetracyclines, initial encounter: Secondary | ICD-10-CM | POA: Insufficient documentation

## 2020-02-03 DIAGNOSIS — T402X5A Adverse effect of other opioids, initial encounter: Secondary | ICD-10-CM | POA: Insufficient documentation

## 2020-02-03 DIAGNOSIS — I4891 Unspecified atrial fibrillation: Secondary | ICD-10-CM | POA: Diagnosis not present

## 2020-02-03 DIAGNOSIS — M25569 Pain in unspecified knee: Secondary | ICD-10-CM | POA: Insufficient documentation

## 2020-02-03 DIAGNOSIS — I480 Paroxysmal atrial fibrillation: Secondary | ICD-10-CM

## 2020-02-03 DIAGNOSIS — M25511 Pain in right shoulder: Secondary | ICD-10-CM | POA: Insufficient documentation

## 2020-02-03 DIAGNOSIS — T360X5A Adverse effect of penicillins, initial encounter: Secondary | ICD-10-CM | POA: Insufficient documentation

## 2020-02-03 MED ORDER — FLECAINIDE ACETATE 50 MG PO TABS
50.0000 mg | ORAL_TABLET | Freq: Two times a day (BID) | ORAL | 3 refills | Status: DC
Start: 2020-02-03 — End: 2020-07-01

## 2020-02-03 MED ORDER — APIXABAN 5 MG PO TABS: 5.0000 mg | ORAL_TABLET | Freq: Two times a day (BID) | ORAL | 3 refills | Status: DC

## 2020-02-03 MED ORDER — METOPROLOL TARTRATE 25 MG PO TABS: 25.0000 mg | ORAL_TABLET | Freq: Two times a day (BID) | ORAL | 3 refills | Status: DC

## 2020-02-03 NOTE — Addendum Note (Signed)
Encounter addended by: Enid Derry, CMA on: 02/03/2020 2:54 PM  Actions taken: Pharmacy for encounter modified, Order list changed

## 2020-02-03 NOTE — Progress Notes (Signed)
Primary Care Physician: Rita Ohara, MD Referring Physician:self referred EP: [name] is a 86 y.o. female with a h/o paroxysmal afib that is in the afib clinic for increase of her afib. It will usually resolve with extra metoprolol. She was at the  physician's office yesterday and fell in the parking lot coming into the clinic. She has some  abrasions of her knee and hands and has a small fx of one of her wrist bones. She  has a f/u appointment to address that. She did not have any afib yesterday. She continues on eliquis 5 mg bid for a CHA2DS2VASc score of at least 3.   F/u in afib clinic, 8/11. She was started on flecainide 50 mg bid early July for increase in afib burden. Sine then afib has been quiet. She feels well. EKg is SR with normal intervals at 63 bpm.   Today, she denies symptoms of palpitations, chest pain, shortness of breath, orthopnea, PND, lower extremity edema, dizziness, presyncope, syncope, or neurologic sequela. The patient is tolerating medications without difficulties and is otherwise without complaint today.   Past Medical History:  Diagnosis Date  . Arthritis   . Atherosclerosis of aorta ([place])    noted on chest CT 10/2015  . Atrial fibrillation ([place])    reported but not yet documented  . Baker cyst, left 06/2017   noted to have large, complex cyst on [place]  . Chronic back pain    Dr. Tonita Cong  . Chronic knee pain    Dr. Wynelle Link  . Closed compression fracture of body of L1 vertebra ([place]) 05/2009   vertebroplasty 06/02/2009  . Closed compression fracture of L3 lumbar vertebra, initial encounter ([place]) 05/2015   vertebroplasty 07/01/2015  . Colon polyp    Dr. Wynetta Emery  . Coronary atherosclerosis    noted on chest CT 10/2015  . Dysrhythmia    atrial fib- mild per pt- no meds  . Food allergy    Scallops--no other seafood allergies  . GERD (gastroesophageal reflux disease)   . Granuloma annulare 09/2016   Dr. Syble Creek  . Hiatal hernia    large,  noted on EGD 01/14/13  . Hyperlipidemia   . Impaired glucose tolerance   . Knee hemarthrosis, left 03/2017  . Lactose intolerance   . Macular degeneration    bilateral  . Osteopenia   . Osteoporosis    treated with Forteo in 2011 by Dr. Earlie Counts  . PVC's (premature ventricular contractions)   . Seasonal allergies   . 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 Tonita Cong, declines sx)  . Spinal stenosis    has had injections  . Vision problem    wears contacts   Past Surgical History:  Procedure Laterality Date  . BACK SURGERY     several vertbea ae rcemntekd  . CHOLECYSTECTOMY N/A 11/05/2018   Procedure: LAPAROSCOPIC CHOLECYSTECTOMY;  Surgeon: Clovis Riley, MD;  Location: [facility];  Service: General;  Laterality: N/A;  . ROBOTIC ASSISTED TOTAL HYSTERECTOMY WITH BILATERAL SALPINGO OOPHERECTOMY Bilateral 03/08/2016   Procedure: ROBOTIC ASSISTED TOTAL HYSTERECTOMY WITH BILATERAL SALPINGO OOPHORECTOMY/Uterosacral Ligament Suspension;  Surgeon: Princess Bruins, MD;  Location: [facility];  Service: Gynecology;  Laterality: Bilateral;  . sebaceous cyst excision  2009   neck  . TONSILLECTOMY AND ADENOIDECTOMY  age 61  . VERTEBROPLASTY  05/2009   L1  . VERTEBROPLASTY  07/01/2015   L3  .  WISDOM TOOTH EXTRACTION      Current Outpatient Medications  Medication Sig Dispense Refill  . Apoaequorin (PREVAGEN) 10 MG CAPS Take 1 capsule by mouth daily.    . cholecalciferol (VITAMIN D3) 25 MCG (1000 UNIT) tablet Take 1,000 Units by mouth daily.    . Cyanocobalamin (VITAMIN B-12 PO) Take 1 tablet by mouth daily.    Marland Kitchen ELIQUIS 5 MG TABS tablet TAKE 1 TABLET BY MOUTH TWICE A DAY 180 tablet 1  . flecainide (TAMBOCOR) 50 MG tablet Take 1 tablet (50 mg total) by mouth 2 (two) times daily. 180 tablet 3  . fluticasone (FLONASE) 50 MCG/ACT nasal spray Place 1 spray into both nostrils daily as needed for allergies or rhinitis.    . metoprolol  tartrate (LOPRESSOR) 25 MG tablet Take 1 tablet (25 mg total) by mouth 2 (two) times daily. 180 tablet 1  . Multiple Vitamin (MULTIVITAMIN WITH MINERALS) TABS tablet Take 1 tablet by mouth daily.    . Multiple Vitamins-Minerals (ICAPS AREDS 2 PO) Take 1 capsule by mouth 2 (two) times a day.    Marland Kitchen omeprazole (PRILOSEC) 20 MG capsule Take 20 mg by mouth daily.    . rosuvastatin (CRESTOR) 10 MG tablet Take 1 tablet (10 mg total) by mouth daily. 90 tablet 1   Current Facility-Administered Medications  Medication Dose Route Frequency Provider Last Rate Last Admin  . denosumab (PROLIA) injection 60 mg  60 mg Subcutaneous Once Rita Ohara, MD        Allergies  Allergen Reactions  . Ciprofloxacin Shortness Of Breath and Rash  . Scallops [Shellfish Allergy] Anaphylaxis  . Demerol Other (See Comments)    Patient states it made her crazy and sleep for 12 hours  . Doxycycline Nausea And Vomiting  . Milk-Related Compounds Diarrhea  . Penicillins Other (See Comments)    Did it involve swelling of the face/tongue/throat, SOB, or low BP? Unknown Did it involve sudden or severe rash/hives, skin peeling, or any reaction on the inside of your mouth or nose? Unknown Did you need to seek medical attention at a hospital or doctor's office? Unknown When did it last happen? If all above answers are "NO", may proceed with cephalosporin use.   . Rivaroxaban Other (See Comments)    Pt unsure but d/c use  . Sudafed [Pseudoephedrine Hcl] Other (See Comments)    hyperactive    Social History   Socioeconomic History  . Marital status: Married    Spouse name: Not on file  . Number of children: 2  . Years of education: Not on file  . Highest education level: Not on file  Occupational History  . Occupation: retired (ran [employer] with her husband)  Tobacco Use  . Smoking status: Never Smoker  . Smokeless tobacco: Never Used  Vaping Use  . Vaping Use: Never used  Substance and Sexual Activity    . Alcohol use: No    Comment: maybe one glass per year.  . Drug use: No  . Sexual activity: Not Currently  Other Topics Concern  . Not on file  Social History Narrative   Widowed, lives alone.  No pets.  Daughter in [place], [place]; Daughter in [place].    Social Determinants of Health   Financial Resource Strain:   . Difficulty of Paying Living Expenses:   Food Insecurity:   . Worried About Charity fundraiser in the Last Year:   . Arboriculturist in the Last Year:   Transportation Needs:   .  Lack of Transportation (Medical):   Marland Kitchen Lack of Transportation (Non-Medical):   Physical Activity:   . Days of Exercise per Week:   . Minutes of Exercise per Session:   Stress:   . Feeling of Stress :   Social Connections:   . Frequency of Communication with Friends and Family:   . Frequency of Social Gatherings with Friends and Family:   . Attends Religious Services:   . Active Member of Clubs or Organizations:   . Attends Archivist Meetings:   Marland Kitchen Marital Status:   Intimate Partner Violence:   . Fear of Current or Ex-Partner:   . Emotionally Abused:   Marland Kitchen Physically Abused:   . Sexually Abused:     Family History  Problem Relation Age of Onset  . Rheumatic fever Mother   . Diabetes Mother   . Heart disease Mother        rheumatic heart disease  . Cancer Sister 67       breast cancer  . Cancer Brother        bladder and prostate cancer  . Migraines Daughter   . Cancer Sister        leukemia at 74; colon cancer in her late 30's--family ?'s dx    ROS- All systems are reviewed and negative except as per the HPI above  Physical Exam: Vitals:   02/03/20 1411  BP: 126/64  Pulse: 68  SpO2: 98%  Weight: 72.3 kg  Height: 5' (1.524 m)   Wt Readings from Last 3 Encounters:  02/03/20 72.3 kg  01/20/20 71.7 kg  01/19/20 71.7 kg    Labs: Lab Results  Component Value Date   NA 142 01/19/2020   K 4.5 01/19/2020   CL 102 01/19/2020   CO2 27 01/19/2020   GLUCOSE 90  01/19/2020   BUN 11 01/19/2020   CREATININE 0.79 01/19/2020   CALCIUM 9.3 01/19/2020   PHOS 2.4 (L) 11/02/2018   MG 2.0 11/06/2018   Lab Results  Component Value Date   INR 1.3 (A) 11/13/2018   Lab Results  Component Value Date   CHOL 146 01/19/2020   HDL 50 01/19/2020   LDLCALC 77 01/19/2020   TRIG 101 01/19/2020     GEN- The patient is well appearing, alert and oriented x 3 today.   Head- normocephalic, atraumatic Eyes-  Sclera clear, conjunctiva pink Ears- hearing intact Oropharynx- clear Neck- supple, no JVP Lymph- no cervical lymphadenopathy Lungs- Clear to ausculation bilaterally, normal work of breathing Heart- Regular rate and rhythm, no murmurs, rubs or gallops, PMI not laterally displaced GI- soft, NT, ND, + BS Extremities- no clubbing, cyanosis, or edema MS- no significant deformity or atrophy Skin- no rash or lesion Psych- euthymic mood, full affect Neuro- strength and sensation are intact  EKG-NSR at 63  bpm, pr int 178  ms, qrs int 82  ms, qtc 429  ms    Assessment and Plan: 1. Afib Increase in frequency until flecainide 50 mg bid was started and now afib has been quiet Continue metoprolol 25 mg bid  She can still take 1/2 tab tab for breakthrough afib  2. CHA2DS2VASc score of at least 3 Continue eliquis 5 mg bid  F/u with Dr. Caryl Comes as scheduled first of 2022  Geroge Baseman. Candace Ramus, [facility] [address] [place], [place] [address] [phone]

## 2020-02-08 ENCOUNTER — Encounter: Payer: Self-pay | Admitting: Family Medicine

## 2020-02-11 ENCOUNTER — Other Ambulatory Visit: Payer: Self-pay | Admitting: Family Medicine

## 2020-02-11 DIAGNOSIS — G4700 Insomnia, unspecified: Secondary | ICD-10-CM

## 2020-02-11 NOTE — Telephone Encounter (Signed)
Is this okay to refill? 

## 2020-02-11 NOTE — Telephone Encounter (Signed)
I think this might be an auto-refill (last filled end of June with 2 refills).  I believe when I last spoke to patient that she wasn't taking it nightly.  Please double check with her to see if she truly needs this.  Thanks

## 2020-02-12 NOTE — Telephone Encounter (Signed)
Pt does not need refill will deny

## 2020-03-11 DIAGNOSIS — H353122 Nonexudative age-related macular degeneration, left eye, intermediate dry stage: Secondary | ICD-10-CM | POA: Diagnosis not present

## 2020-03-11 DIAGNOSIS — H353211 Exudative age-related macular degeneration, right eye, with active choroidal neovascularization: Secondary | ICD-10-CM | POA: Diagnosis not present

## 2020-04-04 ENCOUNTER — Encounter: Payer: Self-pay | Admitting: Family Medicine

## 2020-04-06 ENCOUNTER — Other Ambulatory Visit: Payer: Self-pay

## 2020-04-06 ENCOUNTER — Ambulatory Visit (INDEPENDENT_AMBULATORY_CARE_PROVIDER_SITE_OTHER): Payer: Medicare HMO | Admitting: Family Medicine

## 2020-04-06 ENCOUNTER — Encounter: Payer: Self-pay | Admitting: Family Medicine

## 2020-04-06 VITALS — BP 118/70 | HR 60 | Ht 61.0 in | Wt 160.8 lb

## 2020-04-06 DIAGNOSIS — Z7901 Long term (current) use of anticoagulants: Secondary | ICD-10-CM

## 2020-04-06 DIAGNOSIS — R61 Generalized hyperhidrosis: Secondary | ICD-10-CM

## 2020-04-06 DIAGNOSIS — Z5181 Encounter for therapeutic drug level monitoring: Secondary | ICD-10-CM

## 2020-04-06 DIAGNOSIS — R5383 Other fatigue: Secondary | ICD-10-CM

## 2020-04-06 NOTE — Progress Notes (Signed)
Chief Complaint  Patient presents with   Excessive Sweating    having issues with sweats. Breaks out into sweat, it weakens her afterwards. So much sweat that she needs to change her clothes-it more of a cold sweat. These are not at night. Has been happening since Sat or Sunday.    Patient had her COVID booster on 10/3.  On 10/5 she started with sweats and fatigue. Sweats start when she is just sitting. One day when tried to do something around the house, she felt much worse--exhausted afterwards. She feels wiped out/exhausted.  She is trying to stay well-hydrated, drinking a lot of water, some Gatorade. Sweat occur once or twice a day.  Denies hypoglycemia or related to eating. She thinks her urine is pretty light (except first thing in the morning. Since last week, symptoms have remained the same, though she notes that today the sweats were shorter than other episodes, though still fatigued.  Denies palpitations, tachycardia. She has afib and doing very well from that standpoint. She is on anticoagulants, and denies any bleeding.  Decreased appetite for a few months.  "nothing appeals to me", has to force herself to eat.  Her daughter expressed concern about her not eating enough.  She hasn't lost weight.  She admits that she thinks of her husband constantly, doesn't think she is depressed, looks forward to things. No longer playing bridge, too demanding.  Keeps in touch with friends.  Does crossword puzzles.  Reads a lot.  PMH, PSH, SH reviewed  Outpatient Encounter Medications as of 04/06/2020  Medication Sig   apixaban (ELIQUIS) 5 MG TABS tablet Take 1 tablet (5 mg total) by mouth 2 (two) times daily.   Apoaequorin (PREVAGEN) 10 MG CAPS Take 1 capsule by mouth daily.   cholecalciferol (VITAMIN D3) 25 MCG (1000 UNIT) tablet Take 1,000 Units by mouth daily.   Cyanocobalamin (VITAMIN B-12 PO) Take 1 tablet by mouth daily.   flecainide (TAMBOCOR) 50 MG tablet Take 1 tablet (50 mg  total) by mouth 2 (two) times daily.   fluticasone (FLONASE) 50 MCG/ACT nasal spray Place 1 spray into both nostrils daily as needed for allergies or rhinitis.   metoprolol tartrate (LOPRESSOR) 25 MG tablet Take 1 tablet (25 mg total) by mouth 2 (two) times daily.   Multiple Vitamin (MULTIVITAMIN WITH MINERALS) TABS tablet Take 1 tablet by mouth daily.   Multiple Vitamins-Minerals (ICAPS AREDS 2 PO) Take 1 capsule by mouth 2 (two) times a day.   omeprazole (PRILOSEC) 20 MG capsule Take 20 mg by mouth daily.   rosuvastatin (CRESTOR) 10 MG tablet Take 1 tablet (10 mg total) by mouth daily.   Facility-Administered Encounter Medications as of 04/06/2020  Medication   denosumab (PROLIA) injection 60 mg   Allergies  Allergen Reactions   Ciprofloxacin Shortness Of Breath and Rash   Scallops [Shellfish Allergy] Anaphylaxis   Demerol Other (See Comments)    Patient states it made her crazy and sleep for 12 hours   Doxycycline Nausea And Vomiting   Milk-Related Compounds Diarrhea   Penicillins Other (See Comments)    Did it involve swelling of the face/tongue/throat, SOB, or low BP? Unknown Did it involve sudden or severe rash/hives, skin peeling, or any reaction on the inside of your mouth or nose? Unknown Did you need to seek medical attention at a hospital or doctor's office? Unknown When did it last happen? If all above answers are NO, may proceed with cephalosporin use.    Rivaroxaban Other (See  Comments)    Pt unsure but d/c use   Sudafed [Pseudoephedrine Hcl] Other (See Comments)    hyperactive   ROS: no fever, chills, significant weight changes.  No URI symptoms. +Allergies flaring currently. She uses Flonase daily. She just got an air purifier which has helped a lot. Denies urinary complaints, edema, chest pain, shortness of breath. Just fatigue and sweating episodes.  PHYSICAL EXAM:  BP 118/70    Pulse 60    Ht '5\' 1"'  (1.549 m)    Wt 160 lb 12.8 oz (72.9 kg)     BMI 30.38 kg/m   Wt Readings from Last 3 Encounters:  04/06/20 160 lb 12.8 oz (72.9 kg)  02/03/20 159 lb 6.4 oz (72.3 kg)  01/20/20 158 lb (71.7 kg)   Well-appearing ,pleasant female, who appears younger than her stated age. HEENT: conjunctiva and sclera are clear, EOMI. Wearing mask Neck: no lymphadenopathy or thyromegaly, no bruit.  Nontender large lipoma on posterior left neck, unchanged Heart: regular rate and rhythm Lungs: clear bilaterally Abdomen: soft, nontender, no mass Extremities: no edema Neuro: alert and oriented, normal gait, strength.  ASSESSMENT/PLAN:  Excessive sweating - ?as SE after COVID booster.  eval for thyroiditis, or other etiology with labs. Stay hydrated. f/u if not improving - Plan: CBC with Differential/Platelet, TSH  Other fatigue - Plan: CBC with Differential/Platelet, TSH, Basic metabolic panel  Long term (current) use of anticoagulants - Plan: CBC with Differential/Platelet  Medication monitoring encounter - Plan: CBC with Differential/Platelet, TSH, Basic metabolic panel   CBC, TSH, b-met

## 2020-04-06 NOTE — Patient Instructions (Signed)
Continue to drink plenty of water, to keep your urine nice and pale/clear. We will be in touch with your test results tomorrow. If they are normal, let's wait a week or so and see if symptoms improve on their own (which is likely if related to the vaccine).

## 2020-04-07 LAB — CBC WITH DIFFERENTIAL/PLATELET
Basophils Absolute: 0 10*3/uL (ref 0.0–0.2)
Basos: 1 %
EOS (ABSOLUTE): 0.1 10*3/uL (ref 0.0–0.4)
Eos: 1 %
Hematocrit: 39.1 % (ref 34.0–46.6)
Hemoglobin: 13.2 g/dL (ref 11.1–15.9)
Immature Grans (Abs): 0 10*3/uL (ref 0.0–0.1)
Immature Granulocytes: 0 %
Lymphocytes Absolute: 1.3 10*3/uL (ref 0.7–3.1)
Lymphs: 18 %
MCH: 29.5 pg (ref 26.6–33.0)
MCHC: 33.8 g/dL (ref 31.5–35.7)
MCV: 87 fL (ref 79–97)
Monocytes Absolute: 0.5 10*3/uL (ref 0.1–0.9)
Monocytes: 7 %
Neutrophils Absolute: 5 10*3/uL (ref 1.4–7.0)
Neutrophils: 73 %
Platelets: 175 10*3/uL (ref 150–450)
RBC: 4.48 x10E6/uL (ref 3.77–5.28)
RDW: 12.8 % (ref 11.7–15.4)
WBC: 6.9 10*3/uL (ref 3.4–10.8)

## 2020-04-07 LAB — BASIC METABOLIC PANEL
BUN/Creatinine Ratio: 15 (ref 12–28)
BUN: 10 mg/dL (ref 8–27)
CO2: 25 mmol/L (ref 20–29)
Calcium: 9.1 mg/dL (ref 8.7–10.3)
Chloride: 101 mmol/L (ref 96–106)
Creatinine, Ser: 0.65 mg/dL (ref 0.57–1.00)
GFR calc Af Amer: 92 mL/min/{1.73_m2} (ref >59)
GFR calc non Af Amer: 80 mL/min/{1.73_m2} (ref >59)
Glucose: 135 mg/dL — ABNORMAL HIGH (ref 65–99)
Potassium: 4.3 mmol/L (ref 3.5–5.2)
Sodium: 139 mmol/L (ref 134–144)

## 2020-04-07 LAB — TSH: TSH: 1.4 u[IU]/mL (ref 0.450–4.500)

## 2020-04-15 ENCOUNTER — Other Ambulatory Visit: Payer: Self-pay | Admitting: Family Medicine

## 2020-04-15 DIAGNOSIS — Z1231 Encounter for screening mammogram for malignant neoplasm of breast: Secondary | ICD-10-CM

## 2020-04-20 DIAGNOSIS — R69 Illness, unspecified: Secondary | ICD-10-CM | POA: Diagnosis not present

## 2020-04-22 DIAGNOSIS — H353211 Exudative age-related macular degeneration, right eye, with active choroidal neovascularization: Secondary | ICD-10-CM | POA: Diagnosis not present

## 2020-05-07 ENCOUNTER — Other Ambulatory Visit: Payer: Self-pay | Admitting: Family Medicine

## 2020-05-07 DIAGNOSIS — G4700 Insomnia, unspecified: Secondary | ICD-10-CM

## 2020-05-18 ENCOUNTER — Other Ambulatory Visit: Payer: Self-pay

## 2020-05-18 ENCOUNTER — Ambulatory Visit
Admission: RE | Admit: 2020-05-18 | Discharge: 2020-05-18 | Disposition: A | Discharge status: Home / Self Care | Payer: Medicare HMO | Source: Ambulatory Visit | Attending: Family Medicine | Admitting: Family Medicine

## 2020-05-18 DIAGNOSIS — Z1231 Encounter for screening mammogram for malignant neoplasm of breast: Secondary | ICD-10-CM | POA: Diagnosis not present

## 2020-05-23 ENCOUNTER — Telehealth: Payer: Self-pay | Admitting: Family Medicine

## 2020-05-23 ENCOUNTER — Telehealth: Payer: Self-pay | Admitting: *Deleted

## 2020-05-23 NOTE — Telephone Encounter (Signed)
Just to try and stay hydrated, even if that means ice chips or a popsicle.  Okay for the zofran (though if hers is 8mg , might want to just give 1/2, if she hasn't already given it to her).  Any significant lethargy, dizziness or signs of dehydration, should go to ER where they can do labs, give IV fluids, and further assess (ie scans, if needed, etc)

## 2020-05-23 NOTE — Telephone Encounter (Signed)
Samantha Foley called and is concerned that her mom is having bad diarrhea and vomiting since 7am. She is going to give her some ondansetron and they scheduled a virtual for 3:15pm. Any other suggestions? I did tell them if it get worse to go to the ER, but call us first.

## 2020-05-23 NOTE — Telephone Encounter (Signed)
Patient's daughter advised.  

## 2020-05-30 ENCOUNTER — Other Ambulatory Visit: Payer: Self-pay | Admitting: Family Medicine

## 2020-05-30 DIAGNOSIS — I700 Atherosclerosis of aorta: Secondary | ICD-10-CM

## 2020-05-30 DIAGNOSIS — G4700 Insomnia, unspecified: Secondary | ICD-10-CM

## 2020-05-31 DIAGNOSIS — M5459 Other low back pain: Secondary | ICD-10-CM | POA: Diagnosis not present

## 2020-05-31 NOTE — Telephone Encounter (Signed)
Pt is currently not taking this medication but has plenty of it and may start taking it back. Trying other options first. I will deny

## 2020-06-03 DIAGNOSIS — D3132 Benign neoplasm of left choroid: Secondary | ICD-10-CM | POA: Diagnosis not present

## 2020-06-03 DIAGNOSIS — Z961 Presence of intraocular lens: Secondary | ICD-10-CM | POA: Diagnosis not present

## 2020-06-03 DIAGNOSIS — H353122 Nonexudative age-related macular degeneration, left eye, intermediate dry stage: Secondary | ICD-10-CM | POA: Diagnosis not present

## 2020-06-03 DIAGNOSIS — H353211 Exudative age-related macular degeneration, right eye, with active choroidal neovascularization: Secondary | ICD-10-CM | POA: Diagnosis not present

## 2020-07-01 ENCOUNTER — Other Ambulatory Visit (HOSPITAL_COMMUNITY): Payer: Self-pay | Admitting: *Deleted

## 2020-07-01 MED ORDER — FLECAINIDE ACETATE 50 MG PO TABS
50.0000 mg | ORAL_TABLET | Freq: Two times a day (BID) | ORAL | 3 refills | Status: DC
Start: 2020-07-01 — End: 2021-07-20

## 2020-07-05 ENCOUNTER — Telehealth: Payer: Self-pay | Admitting: Family Medicine

## 2020-07-05 DIAGNOSIS — I700 Atherosclerosis of aorta: Secondary | ICD-10-CM

## 2020-07-05 NOTE — Telephone Encounter (Signed)
Please check with patient.  We last sent in a 90d supply to [facility].  Is she wanting to change to mail order?  [facility]?

## 2020-07-05 NOTE — Telephone Encounter (Signed)
Needs rosuvastatin rx sent to [facility] mail order  Phones [phone] Fax [phone]

## 2020-07-06 MED ORDER — ROSUVASTATIN CALCIUM 10 MG PO TABS: 10.0000 mg | ORAL_TABLET | Freq: Every day | ORAL | 1 refills | Status: DC

## 2020-07-06 NOTE — Telephone Encounter (Signed)
Done

## 2020-07-06 NOTE — Telephone Encounter (Signed)
Spoke with patient and she stated she is no longer driving and would like for this to please go to [facility] ([facility]). I asked if she needed it now, she did check and said she has a quite a bit left but would prefer if we could send it now as [facility] can keep on hold and send when she is ready. If you are okay with that I can send.

## 2020-07-06 NOTE — Telephone Encounter (Signed)
[facility] [place] for #90 with 1 refill

## 2020-07-22 DIAGNOSIS — H353211 Exudative age-related macular degeneration, right eye, with active choroidal neovascularization: Secondary | ICD-10-CM | POA: Diagnosis not present

## 2020-07-28 ENCOUNTER — Other Ambulatory Visit: Payer: Self-pay

## 2020-07-28 ENCOUNTER — Other Ambulatory Visit: Payer: Medicare HMO

## 2020-07-28 DIAGNOSIS — M8580 Other specified disorders of bone density and structure, unspecified site: Secondary | ICD-10-CM

## 2020-08-18 DIAGNOSIS — M1990 Unspecified osteoarthritis, unspecified site: Secondary | ICD-10-CM | POA: Diagnosis not present

## 2020-08-18 DIAGNOSIS — I4891 Unspecified atrial fibrillation: Secondary | ICD-10-CM | POA: Diagnosis not present

## 2020-08-18 DIAGNOSIS — E669 Obesity, unspecified: Secondary | ICD-10-CM | POA: Diagnosis not present

## 2020-08-18 DIAGNOSIS — Z6830 Body mass index (BMI) 30.0-30.9, adult: Secondary | ICD-10-CM | POA: Diagnosis not present

## 2020-08-18 DIAGNOSIS — H3530 Unspecified macular degeneration: Secondary | ICD-10-CM | POA: Diagnosis not present

## 2020-08-18 DIAGNOSIS — I10 Essential (primary) hypertension: Secondary | ICD-10-CM | POA: Diagnosis not present

## 2020-08-18 DIAGNOSIS — I739 Peripheral vascular disease, unspecified: Secondary | ICD-10-CM | POA: Diagnosis not present

## 2020-08-18 DIAGNOSIS — E785 Hyperlipidemia, unspecified: Secondary | ICD-10-CM | POA: Diagnosis not present

## 2020-08-18 DIAGNOSIS — D6869 Other thrombophilia: Secondary | ICD-10-CM | POA: Diagnosis not present

## 2020-08-18 DIAGNOSIS — Z008 Encounter for other general examination: Secondary | ICD-10-CM | POA: Diagnosis not present

## 2020-08-18 DIAGNOSIS — K219 Gastro-esophageal reflux disease without esophagitis: Secondary | ICD-10-CM | POA: Diagnosis not present

## 2020-08-22 DIAGNOSIS — Z5111 Encounter for antineoplastic chemotherapy: Secondary | ICD-10-CM | POA: Diagnosis not present

## 2020-08-22 DIAGNOSIS — H353211 Exudative age-related macular degeneration, right eye, with active choroidal neovascularization: Secondary | ICD-10-CM | POA: Diagnosis not present

## 2020-08-30 ENCOUNTER — Telehealth: Payer: Self-pay | Admitting: Internal Medicine

## 2020-08-30 NOTE — Telephone Encounter (Signed)
Pt c/o medication issue:  1. Name of Medication: metoprolol tartrate (LOPRESSOR) 25 MG tablet  2. How are you currently taking this medication (dosage and times per day)? 1 tablet twice a day  3. Are you having a reaction (difficulty breathing--STAT)? no  4. What is your medication issue? Patient states her prescription at the pharmacy is for half a tablet, but she is supposed to be taking 1 tablet twice a day. She would like this updated and sent to the pharmacy.

## 2020-09-01 ENCOUNTER — Telehealth: Payer: Self-pay

## 2020-09-01 MED ORDER — METOPROLOL TARTRATE 25 MG PO TABS: 25.0000 mg | ORAL_TABLET | Freq: Two times a day (BID) | ORAL | 3 refills | Status: DC

## 2020-09-01 NOTE — Telephone Encounter (Signed)
Please advise of wht you find out from our call. [place]

## 2020-09-01 NOTE — Telephone Encounter (Signed)
-----   Message from [place] sent at 09/01/2020  1:44 PM EST ----- On 01/18/20 this patient received a prolia injection here in the office but charges never dropped for it.  Can you look back at this visit and see if notice anything that may have been missed that would have prevented her charges from dropping?

## 2020-09-01 NOTE — Telephone Encounter (Signed)
Spoke with pt who states her current Rx for Metoprolol Tartrate states 1/2 tablet by mouth twice daily but pt was advised to take 1 tablet by mouth daily by Doristine Devoid with Afib clinic.  Pt states she receives her medications from [facility] service.  Pt's current bottle shows Dr Caryl Comes as prescriber.  Pt advised [facility]  Pt states she was unaware and never picked up that Rx.  Pt requests Metoprolol Rx be sent to [facility] as she is almost out.  Metoprolol sent to pharmacy as requested.  Pt verbalizes understanding and thanked Therapist, sports for the call.

## 2020-09-12 ENCOUNTER — Ambulatory Visit: Payer: Medicare HMO | Admitting: Family Medicine

## 2020-09-13 ENCOUNTER — Encounter: Payer: Self-pay | Admitting: Internal Medicine

## 2020-09-13 ENCOUNTER — Other Ambulatory Visit: Payer: Self-pay

## 2020-09-13 ENCOUNTER — Ambulatory Visit: Payer: Medicare HMO | Admitting: Internal Medicine

## 2020-09-13 VITALS — BP 116/74 | HR 65 | Ht 61.0 in | Wt 160.0 lb

## 2020-09-13 DIAGNOSIS — I480 Paroxysmal atrial fibrillation: Secondary | ICD-10-CM | POA: Diagnosis not present

## 2020-09-13 NOTE — Progress Notes (Signed)
Patient Care Team: Rita Ohara, MD as PCP - General (Family Medicine) Deboraha Sprang, MD as PCP - Electrophysiology (Cardiology)   HPI  Samantha Foley is a 87 y.o. female Seen in followup for paroxysmal atrial fibrillation of long-standing.  Anticoagulation with Apixoban (5 mg twice daily--cr < 1.5 and weight > 60 kg)   No significant palpitations on the flecainide. No bleeding on anticoagulation.  Has some dyspnea when she starts off walking it then abates if she continues to walk.  Her daughter is concerned about her wobbliness is recommended that she monitor use a cane  No edema.  Nocturnal dyspnea.    Date Cr K Hgb  10/21 0.65 4.3 13.2           Has been a recently challenging time with loneliness following the death of her husband 09/01/14  He called her [name]  She met him when she was 42     Past Medical History:  Diagnosis Date  . Arthritis   . Atherosclerosis of aorta ([place])    noted on chest CT 10/2015  . Atrial fibrillation ([place])    reported but not yet documented  . Baker cyst, left 06/2017   noted to have large, complex cyst on [place]  . Chronic back pain    Dr. Tonita Cong  . Chronic knee pain    Dr. Wynelle Link  . Closed compression fracture of body of L1 vertebra ([place]) 05/2009   vertebroplasty 06/02/2009  . Closed compression fracture of L3 lumbar vertebra, initial encounter ([place]) 05/2015   vertebroplasty 07/01/2015  . Colon polyp    Dr. Wynetta Emery  . Coronary atherosclerosis    noted on chest CT 10/2015  . Dysrhythmia    atrial fib- mild per pt- no meds  . Food allergy    Scallops--no other seafood allergies  . GERD (gastroesophageal reflux disease)   . Granuloma annulare 09/2016   Dr. Syble [place]  . Hiatal hernia    large, noted on EGD 01/14/13  . Hyperlipidemia   . Impaired glucose tolerance   . Knee hemarthrosis, left 03/2017  . Lactose intolerance   . Macular degeneration    bilateral  . Osteopenia   . Osteoporosis    treated with Forteo in 08-31-2009  by Dr. Earlie Counts  . PVC's (premature ventricular contractions)   . Seasonal allergies   . 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 Tonita Cong, declines sx)  . Spinal stenosis    has had injections  . Vision problem    wears contacts    Past Surgical History:  Procedure Laterality Date  . BACK SURGERY     several vertbea ae rcemntekd  . CHOLECYSTECTOMY N/A 11/05/2018   Procedure: LAPAROSCOPIC CHOLECYSTECTOMY;  Surgeon: Clovis Riley, MD;  Location: [facility];  Service: General;  Laterality: N/A;  . ROBOTIC ASSISTED TOTAL HYSTERECTOMY WITH BILATERAL SALPINGO OOPHERECTOMY Bilateral 03/08/2016   Procedure: ROBOTIC ASSISTED TOTAL HYSTERECTOMY WITH BILATERAL SALPINGO OOPHORECTOMY/Uterosacral Ligament Suspension;  Surgeon: Princess Bruins, MD;  Location: [facility];  Service: Gynecology;  Laterality: Bilateral;  . sebaceous cyst excision  09/01/07   neck  . TONSILLECTOMY AND ADENOIDECTOMY  age 72  . VERTEBROPLASTY  05/2009   L1  . VERTEBROPLASTY  07/01/2015   L3  . WISDOM TOOTH EXTRACTION      Current Outpatient Medications  Medication Sig Dispense Refill  . apixaban (ELIQUIS) 5 MG TABS tablet Take 1 tablet (5  mg total) by mouth 2 (two) times daily. 180 tablet 3  . Apoaequorin (PREVAGEN) 10 MG CAPS Take 1 capsule by mouth daily.    . cholecalciferol (VITAMIN D3) 25 MCG (1000 UNIT) tablet Take 1,000 Units by mouth daily.    . Cyanocobalamin (VITAMIN B-12 PO) Take 1 tablet by mouth daily.    . flecainide (TAMBOCOR) 50 MG tablet Take 1 tablet (50 mg total) by mouth 2 (two) times daily. 180 tablet 3  . fluticasone (FLONASE) 50 MCG/ACT nasal spray Place 1 spray into both nostrils daily as needed for allergies or rhinitis.    . metoprolol tartrate (LOPRESSOR) 25 MG tablet Take 1 tablet (25 mg total) by mouth 2 (two) times daily. 180 tablet 3  . Multiple Vitamin (MULTIVITAMIN WITH MINERALS) TABS tablet Take 1 tablet by mouth daily.    .  Multiple Vitamins-Minerals (ICAPS AREDS 2 PO) Take 1 capsule by mouth 2 (two) times a day.    Marland Kitchen omeprazole (PRILOSEC) 20 MG capsule Take 20 mg by mouth daily.    . rosuvastatin (CRESTOR) 10 MG tablet Take 1 tablet (10 mg total) by mouth daily. 90 tablet 1   No current facility-administered medications for this visit.    Allergies  Allergen Reactions  . Ciprofloxacin Shortness Of Breath and Rash  . Scallops [Shellfish Allergy] Anaphylaxis  . Demerol Other (See Comments)    Patient states it made her crazy and sleep for 12 hours  . Doxycycline Nausea And Vomiting  . Milk-Related Compounds Diarrhea  . Penicillins Other (See Comments)    Did it involve swelling of the face/tongue/throat, SOB, or low BP? Unknown Did it involve sudden or severe rash/hives, skin peeling, or any reaction on the inside of your mouth or nose? Unknown Did you need to seek medical attention at a hospital or doctor's office? Unknown When did it last happen? If all above answers are "NO", may proceed with cephalosporin use.   . Rivaroxaban Other (See Comments)    Pt unsure but d/c use  . Sudafed [Pseudoephedrine Hcl] Other (See Comments)    hyperactive    Review of Systems negative except from HPI and PMH  Physical Exam BP 116/74   Pulse 65   Ht '5\' 1"'  (1.549 m)   Wt 160 lb (72.6 kg)   SpO2 97%   BMI 30.23 kg/m  Well developed and nourished in no acute distress HENT normal Neck supple with JVP-  flat   Clear Regular rate and rhythm, no murmurs or gallops Abd-soft with active BS No Clubbing cyanosis edema Skin-warm and dry A & Oriented  Grossly normal sensory and motor function  ECG sinus @ 65 17/09/39     Assessment and Plan   Paroxysmal atrial fibrillation   Dyspnea on exertion  On Anticoagulation;  No bleeding issues   Struggling with loss of her husband still and loneliness   Ambulating in the office was associated with an appropriate heart rate response and oxygen saturation  stayed above 95.  We did not reproduce her symptoms.  1 thought was that it could be associated with chronotropic incompetence.  Nothing to validate that hypothesis.  Discussed the importance of balance and strength.  Reviewed the tensions that are typical in the situations regarding independence on the part of the elderly and security concerns on the part of the children.  Recommended Being Mortal.

## 2020-09-13 NOTE — Patient Instructions (Signed)

## 2020-09-22 ENCOUNTER — Telehealth: Payer: Medicare HMO | Admitting: Family Medicine

## 2020-10-07 DIAGNOSIS — H353122 Nonexudative age-related macular degeneration, left eye, intermediate dry stage: Secondary | ICD-10-CM | POA: Diagnosis not present

## 2020-10-07 DIAGNOSIS — H353211 Exudative age-related macular degeneration, right eye, with active choroidal neovascularization: Secondary | ICD-10-CM | POA: Diagnosis not present

## 2020-10-27 DIAGNOSIS — M5417 Radiculopathy, lumbosacral region: Secondary | ICD-10-CM | POA: Diagnosis not present

## 2020-10-27 DIAGNOSIS — M545 Low back pain, unspecified: Secondary | ICD-10-CM | POA: Diagnosis not present

## 2020-11-14 ENCOUNTER — Other Ambulatory Visit: Payer: Self-pay | Admitting: *Deleted

## 2020-11-14 ENCOUNTER — Other Ambulatory Visit: Payer: Self-pay | Admitting: Internal Medicine

## 2020-11-14 MED ORDER — ELIQUIS 5 MG PO TABS: 1.0000 | ORAL_TABLET | Freq: Two times a day (BID) | ORAL | 0 refills | Status: DC

## 2020-11-14 NOTE — Telephone Encounter (Signed)
Prescription refill request for Eliquis received.  Indication: Afib Last office visit: Caryl Comes, 09/13/2020 Scr: 0.65, 04/06/2020  Age: 87 yo  Weight: 72.6 kg   Pt is on the correct dose of Eliquis per dosing criteria, prescription refill sent for Eliquis 5mg  bid.

## 2020-11-14 NOTE — Telephone Encounter (Signed)
Pt sent my chart message for a 1 week supply locally until mail order comes. Please refer to encounter.  87yrs old, wt-72.6kg, Crea-0.65 on 04/06/2020, last seen by Caryl Comes on 08/24/2020

## 2020-11-20 NOTE — Progress Notes (Deleted)
Samantha Foley is a 87 y.o. female who presents for annual physical exam, Medicare wellness visit and follow-up on chronic medical conditions.   She has the following concerns:  Osteoporosis:  She hash/o L1 compression fracture in 05/2009 and L3 compression fracture in 05/2015. She underwent vertebroplasties in 05/2009 and 06/2015. Per Dr. Vincente Poli notes, she was treated with Forteo in 2011. Per daughter [name], recalls using bisphosphonate in the past. Unsure of details (which one, for how long, but thinks prior to the [place]ter). DEXA 11/2019 showed osteoporosis, with T-2.7 at R femur neck (was -2.3 in 10/2015), -2.0 L wrist. She was started on Prolia at that time, getting injections every 6 months. Last was in 07/2020. She is tolerating these without side effects. She had a normal vitamin D level in 08/2019.  Hyperlipidemia and aortic atherosclerosis: She was started on Crestor in May 2021 (aortic atherosclerosis noted on chest CT 2017) Lipids prior to statin were:01/2018 TC 241, LDL 163, TG 131, HDL 51 She reports compliance with crestor, and denies side effect.  Lab Results  Component Value Date   CHOL 146 01/19/2020   HDL 50 01/19/2020   LDLCALC 77 01/19/2020   TRIG 101 01/19/2020   CHOLHDL 2.9 01/19/2020   Insomnia, intermittent--she has good nights and bad nights.  Moods?  Paroxysmal atrial fibrillation:  Under the care of cardiologist, Dr. Caryl Comes, last seen in 08/2020.  She has been doing well on Flecainide (started in 12/2019). She remains on Eliquis, and denies bleeding.  HH/GERD:  Takes omeprazole 49m daily.  She gets occasional heartburn (certain foods trigger), about 2x/month.  If it gets bad, she uses Tums with good results. Large HH was noted on EGD done in 12/2012.  She denies dysphagia.  Under routine care of GYN, who manages her pessary.  R shoulder pain (managed with periodic injections from Dr. BTonita Cong, L knee pain (sees Dr. AWynelle Linkprn) Chronic back pain--Dr. BTonita Cong  appt 5/5?? Got #30 of Percocet 10/27/20  Macular degeneration:  Under care of ophtho.  She no longer drives.  Immunization History  Administered Date(s) Administered  . Fluad Quad(high Dose 65+) 03/01/2019  . Influenza Split 02/27/2012, 04/20/2020  . Influenza, High Dose Seasonal PF 03/18/2018  . Influenza,inj,quad, With Preservative 03/18/2018, 03/01/2019  . Influenza-Unspecified 03/13/2017  . PFIZER(Purple Top)SARS-COV-2 Vaccination 07/30/2019, 08/20/2019, 03/27/2020  . Pneumococcal Conjugate-13 01/24/2015  . Pneumococcal Polysaccharide-23 04/26/2004  . Td 11/26/1994, 12/23/2005  . Tdap 03/07/2011, 01/24/2015  . Zoster Recombinat (Shingrix) 03/06/2018  . Zoster, Live 07/16/2005   Last Pap smear: 2011 (?); she is s/p hysterectomy Last mammogram: 04/2020 Last colonoscopy: 12/2012 (Dr. JWynetta Emery Last DEXA: 11/2019 Dentist: ODevona Konig Exercise:  Other doctors caring for patient include: Cardiology: Dr. KCaryl Comes Dr. TChalmers CaterGYN:    Ophtho[facility]ison [facility]ist: GI: previously Dr. JWynetta EmeryOrtho: [facility] BTonita Cong[facility] Dr. AWynelle Linkfor L knee pain; [facility]   Depression screen:   Fall Screen: 11/2019 tripped on curb coming into our office Functional Status Survey: notable for Mini-Cog: See Epic for full questionnaires/screens  End of Life Discussion:  Patient {ACTIONS; HAS/DOES NOT HAVE:19233} a living will and medical power of attorney   PMH, PSH, SH and FH were reviewed and updated   ROS: denies fever, chills, URI symptoms, cough, shortness of breath, chest pain.   She denies nausea, vomiting, dysphagia.  Denies any bowel changes or abdominal pain.  Denies any vaginal bleeding, vaginal discharge.  Denies urinary complaints. Denies edema, bleeding, bruising, rashes. Intermittent insomnia.  Moods? R shoulder pain L knee pain Palpitations infrequent, and extra dose of metoprolol effective when used.   Occasional heartburn. Has pessary.  Spotting? See HPI   PHYSICAL EXAM:  Wt Readings from Last 3 Encounters:  09/13/20 160 lb (72.6 kg)  04/06/20 160 lb 12.8 oz (72.9 kg)  02/03/20 159 lb 6.4 oz (72.3 kg)   General Appearance:    Alert, cooperative, no distress, appears stated age  Head:    Normocephalic, without obvious abnormality, atraumatic  Eyes:    PERRL, conjunctiva/corneas clear, EOM's intact, fundi    benign  Ears:    Normal TM's and external ear canals  Nose:   Nares normal, mucosa normal, no drainage or sinus   tenderness  Throat:   Lips, mucosa, and tongue normal; teeth and gums normal  Neck:   4cm lipoma left posterior neck, nontender.  Pt states is longstanding and denies change.  No lymphadenopathy, thyromegaly or carotid bruit  Back:    Spine nontender, no curvature, ROM normal, no CVA     tenderness  Lungs:     Clear to auscultation bilaterally without wheezes, rales or     ronchi; respirations unlabored  Chest Wall:    No tenderness or deformity   Heart:    Rhythm is fairly regular, occasional pause.  No murmur. Normal S1 and S2   Breast Exam:    Deferred to GYN  Abdomen:     Soft, non-tender, nondistended, normoactive bowel sounds,    no masses, no hepatosplenomegaly  Genitalia:    Deferred to GYN     Extremities:   No clubbing, cyanosis or edema  Pulses:   2+ and symmetric all extremities  Skin:   Skin color, texture, turgor normal, no rashes or lesions  Lymph nodes:   Cervical, supraclavicular, and axillary nodes normal  Neurologic:   Nnormal strength, sensation and gait; reflexes 2+ and symmetric throughout          Psych:   Normal mood, affect, hygiene and grooming.    PHQ-9   ASSESSMENT/PLAN:  COVID booster.  If she already got, then consider pneumovax booster (if didn't get, offer, and can get pneumovax at [facility] in 2 weeks) Did she ever get 2nd shingrix??  c-met, cbc, lipid  Discussed monthly self breast exams and yearly mammograms; at least 30  minutes of aerobic activity at least 5 days/week and weight-bearing exercise 2x/week; proper sunscreen use reviewed; healthy diet, including goals of calcium and vitamin D intake and alcohol recommendations (less than or equal to 1 drink/day) reviewed; regular seatbelt use; changing batteries in smoke detectors, use of carbon monoxide detectors.  Immunization recommendations discussed--continue yearly high dose flu shots.  Colonoscopy recommendations reviewed, no longer needed based on age. Repeat DEXA 11/2021.   Medicare Attestation I have personally reviewed: The patient's medical and social history Their use of alcohol, tobacco or illicit drugs Their current medications and supplements The patient's functional ability including ADLs,fall risks, home safety risks, cognitive, and hearing and visual impairment Diet and physical activities Evidence for depression or mood disorders  The patient's weight, height, BMI have been recorded in the chart.  I have made referrals, counseling, and provided education to the patient based on review of the above and I have provided the patient with a written personalized care plan for preventive services.     Vikki Ports, MD

## 2020-11-22 MED ORDER — ELIQUIS 5 MG PO TABS: 5.0000 mg | ORAL_TABLET | Freq: Two times a day (BID) | ORAL | 0 refills | Status: DC

## 2020-11-22 NOTE — Addendum Note (Signed)
Addended by: Thora Lance on: 11/22/2020 07:11 PM   Modules accepted: Orders

## 2020-11-23 ENCOUNTER — Ambulatory Visit: Payer: Medicare HMO | Admitting: Family Medicine

## 2020-11-23 ENCOUNTER — Encounter: Payer: Self-pay | Admitting: Family Medicine

## 2020-11-23 DIAGNOSIS — I480 Paroxysmal atrial fibrillation: Secondary | ICD-10-CM

## 2020-11-23 DIAGNOSIS — K219 Gastro-esophageal reflux disease without esophagitis: Secondary | ICD-10-CM

## 2020-11-23 DIAGNOSIS — M810 Age-related osteoporosis without current pathological fracture: Secondary | ICD-10-CM

## 2020-11-23 DIAGNOSIS — Z0000 Encounter for general adult medical examination without abnormal findings: Secondary | ICD-10-CM

## 2020-11-23 DIAGNOSIS — I700 Atherosclerosis of aorta: Secondary | ICD-10-CM

## 2020-11-23 DIAGNOSIS — E7800 Pure hypercholesterolemia, unspecified: Secondary | ICD-10-CM

## 2020-11-28 DIAGNOSIS — D3132 Benign neoplasm of left choroid: Secondary | ICD-10-CM | POA: Diagnosis not present

## 2020-11-28 DIAGNOSIS — Z961 Presence of intraocular lens: Secondary | ICD-10-CM | POA: Diagnosis not present

## 2020-11-28 DIAGNOSIS — H353120 Nonexudative age-related macular degeneration, left eye, stage unspecified: Secondary | ICD-10-CM | POA: Diagnosis not present

## 2020-11-28 DIAGNOSIS — H353122 Nonexudative age-related macular degeneration, left eye, intermediate dry stage: Secondary | ICD-10-CM | POA: Diagnosis not present

## 2020-11-28 DIAGNOSIS — H353211 Exudative age-related macular degeneration, right eye, with active choroidal neovascularization: Secondary | ICD-10-CM | POA: Diagnosis not present

## 2020-12-06 DIAGNOSIS — M545 Low back pain, unspecified: Secondary | ICD-10-CM | POA: Diagnosis not present

## 2020-12-06 DIAGNOSIS — M5459 Other low back pain: Secondary | ICD-10-CM | POA: Diagnosis not present

## 2021-01-10 DIAGNOSIS — M25521 Pain in right elbow: Secondary | ICD-10-CM | POA: Diagnosis not present

## 2021-01-11 NOTE — Patient Instructions (Addendum)
HEALTH MAINTENANCE RECOMMENDATIONS:  It is recommended that you get at least 30 minutes of aerobic exercise at least 5 days/week (for weight loss, you may need as much as 60-90 minutes). This can be any activity that gets your heart rate up. This can be divided in 10-15 minute intervals if needed, but try and build up your endurance at least once a week.  Weight bearing exercise is also recommended twice weekly.  Eat a healthy diet with lots of vegetables, fruits and fiber.  "Colorful" foods have a lot of vitamins (ie green vegetables, tomatoes, red peppers, etc).  Limit sweet tea, regular sodas and alcoholic beverages, all of which has a lot of calories and sugar.  Up to 1 alcoholic drink daily may be beneficial for women (unless trying to lose weight, watch sugars).  Drink a lot of water.  Calcium recommendations are 1200-1500 mg daily (1500 mg for postmenopausal women or women without ovaries), and vitamin D 1000 IU daily.  This should be obtained from diet and/or supplements (vitamins), and calcium should not be taken all at once, but in divided doses.  Monthly self breast exams and yearly mammograms for women over the age of 72 is recommended.  Sunscreen of at least SPF 30 should be used on all sun-exposed parts of the skin when outside between the hours of 10 am and 4 pm (not just when at beach or pool, but even with exercise, golf, tennis, and yard work!)  Use a sunscreen that says "broad spectrum" so it covers both UVA and UVB rays, and make sure to reapply every 1-2 hours.  Remember to change the batteries in your smoke detectors when changing your clock times in the spring and fall. Carbon monoxide detectors are recommended for your home.  Use your seat belt every time you are in a car, and please drive safely and not be distracted with cell phones and texting while driving.   Samantha Foley , Thank you for taking time to come for your Medicare Wellness Visit. I appreciate your ongoing  commitment to your health goals. Please review the following plan we discussed and let me know if I can assist you in the future.    This is a list of the screening recommended for you and due dates:  Health Maintenance  Topic Date Due   Zoster (Shingles) Vaccine (2 of 2) 05/01/2018   Flu Shot  01/23/2021   Tetanus Vaccine  01/23/2025   DEXA scan (bone density measurement)  Completed   COVID-19 Vaccine  Completed   Pneumonia vaccines  Completed   HPV Vaccine  Aged Out   Pneumovax booster was given today.  Next bone density test will be due in 11/2021  Continue yearly flu shots in the Fall  Use Miralax (1 capful) on the days that you take pain medication.  You may continue it for a few days if needed, at 1/2 capful.  Try taking Tylenol Arthritis at bedtime, instead of plain tylenol, to see if you sleep longer. You may want to re-try the trazadone at bedtime.  Don't take benadryl along with it.   You can take a second dose of omeprazole in the evenings, if needed when you have the excessive belching, since that isn't fixed by the Tums.  Please schedule a GYN exam with Dr. Benjie Karvonen.  Continue to do word games, etc. Physical activity and adequate sleep also help with memory.    Cognitive Tips  Keep a journal/notebook with sections for the following (  or use sections separately as needed):  Calendar and appointment sheet, schedule for each day, lists of reminders (such as grocery lists or "to do" list), homework assignments for therapy, important information such as family and friends names / addresses / phone numbers, medications, medical history and doctors name / phone numbers.  Avoid / remove clutter and unnecessary items from areas such as countertops / cabinets in kitchen and bathroom, closets, etc.  Organize items by purpose.  Baskets and bins help with this.  Leave notes for reminders above task to be completed.  For example: Note to turn off stove over the the stove; note to  lock door beside the door, not to brush teeth then wash face by sink, note to take medication on table etc.  To help recall names of people of people or things, mentally or verbally go through the alphabet to try to determine the 1st letter of the word as this may trigger the name or word you are looking for.  If this is too difficult and someone else knows the word, have them give you the first letter by asking, "does it start with an "A", "B", "C" etc. (or have them give you the first sound of the word or some other clue).  Review family events, occasions, names, etc.  Pictures are a good way to trigger memory.  Have others correct you if you answer something incorrectly.  Have them speak slowly with a few words to give you time to process and respond.  Don't let others automatically problem-solve. (For example: don't let them automatically lay out clothes in the correct position, but hand it to you folded so that you can figure it out for yourself.)  However, if you need help with tasks, they should give you as little as they can so that you can be successful.  If appropriate and safe, they may allow you to make mistakes so that you can figure out how to correct the error.  (For example, they may allow you to put your shoes on the wrong foot to see if you notice that is wrong).  If you struggle, they should give you a cue.  (Example: "Do your shoes feel right?"  "Do they look right?")

## 2021-01-11 NOTE — Progress Notes (Signed)
Chief Complaint  Patient presents with   Medicare Wellness    Fasting AWV/CPE with pelvic exam per patient. Asking for rx for omeprazole to be sent to [facility] mail order. Also having some memory issues. Is okay to have pneumonia vaccine today. She complains of feeling weak and chilled at least once a week. Takes a pain pill for her back and gets constipated, doubling up on metamucil makes her stools too loose-any other recommendations. PHQ9(5)   Samantha Foley is a 87 y.o. female who presents for annual physical exam, Medicare wellness visit and follow-up on chronic medical conditions.  She is accompanied by her daughter Maudie Mercury today. She has the following concerns:  Memory--trouble remembering names of places she has been, trouble with people's names. She and daughter deny other memory concerns--not asking the same questions repeatedly, or forgetting more important things, no safety concerns.  Feels weak/chilled at least once a weak. This is often after "overdoing it" the day before, and then sometimes feeling weak, especially if she doesn't eat. She has decreased appetite, sometimes doesn't eat and then feels weak.  This has been an ongoing issue.   She denies depression--still sometimes gets sad (related to missing husband), but thinks her appetite and sleep issues are unrelated to any depression.  Constipation related to pain medication prescribed for her back. She uses pain medications infrequently, once a week or less. She has been getting injections, reports that 2 were not helpful, but she recently saw Dr. Tonita Cong, had another one, felt like he got the correct spot (whereas the other provider didn't for the last 2 shots). Based on location, and that it was done in the office by the provider, seems like she got L SI joint injection.  She got good relief from the last one.  Got #30 of hydrocodone 10/27/20  She saw Dr. Caralyn Guile this week for R elbow pain, having had pain since 7/3.  They were told it was  tennis elbow, got injection and it is already feeling better   Osteoporosis:  She has h/o L1 compression fracture in 05/2009 and L3 compression fracture in 05/2015.  She underwent vertebroplasties in 05/2009 and 06/2015.  Per Dr. Vincente Poli notes, she was treated with Forteo in 2011. Per daughter [name], recalls using bisphosphonate in the past (unsure for how long, but thinks prior to the [place]). DEXA 11/2019 showed osteoporosis, with T-2.7 at R femur neck (was -2.3 in 10/2015), -2.0 L wrist. She was started on Prolia at that time, getting injections every 6 months. Last was in 07/2020. She is tolerating these without side effects. She had a normal vitamin D level in 08/2019. She no longer takes vitamin D, but takes multivitamins. She does not take any calcium (just from MVI and occasional Tums) or get weight-bearing exercise.   Hyperlipidemia and aortic atherosclerosis: She was started on Crestor in May 2021 (aortic atherosclerosis noted on chest CT 2017) Lipids prior to statin were: 01/2018 TC 241, LDL 163, TG 131, HDL 51 She reports compliance with crestor, and denies side effect.  She is due for recheck Lab Results  Component Value Date   CHOL 146 01/19/2020   HDL 50 01/19/2020   LDLCALC 77 01/19/2020   TRIG 101 01/19/2020   CHOLHDL 2.9 01/19/2020   Insomnia, intermittent--she has a history of having good nights and bad nights.  Having more bad nights now.  Sometimes can't fall asleep--2 Tylenol and Benadryl help, but wakes up after 4 hours. She can't get back to sleep.  Takes longer to sleep without med. Previously was prescribed Trazadone. She still has this at home, hasn't taken it.    Paroxysmal atrial fibrillation:  Under the care of cardiologist, Dr. Caryl Comes, last seen in 08/2020.  She has been doing well on Flecainide (started in 12/2019). She remains on Eliquis, and denies bleeding.   HH/GERD:  Takes omeprazole 27m daily.  She takes Tums when she has heartburn or burping (which can be  unrelenting). Tums doesn't help with the burping, just the occasional heartburn. Burping is often later in the day. She gets occasional heartburn (certain foods trigger), about 2x/month.  If it gets bad, she uses Tums with good results. Large HH was noted on EGD done in 12/2012.  She denies dysphagia.   R shoulder pain (managed with periodic injections from Dr. BTonita Cong, doing well hasn't needed recently. L knee pain (sees Dr. AWynelle Linkprn), hasn't had an injection since prior to COVID, and doing well, compression socks help.   Macular degeneration:  Under care of ophtho.  She no longer drives. Vision has improved some.  Lipoma on L neck--thinks has gotten a little larger.  Keeps growing back.  Has had it removed twice. It isn't growing rapidly and isn't painful.  She has been under the care of GYN, not gone since COVID. They manage her pessary, which she continues to use.  Daughter (and pt) would like for her to continue to see Dr. MBenjie Karvonenand will schedule an appointment. (Decline breast/pelvic exam today)  Immunization History  Administered Date(s) Administered   Fluad Quad(high Dose 65+) 03/01/2019   Influenza Split 02/27/2012, 04/20/2020   Influenza, High Dose Seasonal PF 03/18/2018   Influenza,inj,quad, With Preservative 03/18/2018, 03/01/2019   Influenza-Unspecified 03/13/2017   PFIZER(Purple Top)SARS-COV-2 Vaccination 07/30/2019, 08/20/2019, 03/27/2020, 10/27/2020   Pneumococcal Conjugate-13 01/24/2015   Pneumococcal Polysaccharide-23 04/26/2004   Td 11/26/1994, 12/23/2005   Tdap 03/07/2011, 01/24/2015   Zoster Recombinat (Shingrix) 03/06/2018, 03/27/2020   Zoster, Live 07/16/2005   Last Pap smear: 2011 (?); she is s/p hysterectomy Last mammogram: 04/2020 Last colonoscopy: 12/2012 (Dr. JWynetta Emery Last DEXA: 11/2019 T-2.7 R fem neck Dentist: scheduled for 02/2021 (last was in 2020) Ophtho: every 6 weeks Exercise: none   Other doctors caring for patient include: Cardiology: Dr.  KCaryl Comes Dr. TChalmers Cater(afib clinic) GYN:  Dr. MBenjie Karvonen[facility] Dentist: Dr. AEvans LanceGI: previously Dr. JWynetta EmeryOrtho: Dr. BTonita Cong(for R shoulder pain from torn RC and back); Dr. AWynelle Linkfor L knee pain;  Dr. OCaralyn Guilefor elbow    Depression screen:  negative Fall Screen: None Functional Status Survey: notable for some trouble with her memory per HPI, leakage of urine with sneezing. She doesn't drive.  Decreased vision from macular degeneration, but this is stable/improved.  Wears hearing aids. Mini-Cog: 4/5 (2/3 recall, normal clock) See Epic for full questionnaires/screens   End of Life Discussion:  Patient does not have a living will or medical power of attorney but is working on this. New forms given.     PMH, PSH, SH and FH were reviewed and updated     Outpatient Encounter Medications as of 01/12/2021  Medication Sig   apixaban (ELIQUIS) 5 MG TABS tablet Take 1 tablet (5 mg total) by mouth 2 (two) times daily.   Apoaequorin (PREVAGEN) 10 MG CAPS Take 1 capsule by mouth daily.   Cyanocobalamin (VITAMIN B-12 PO) Take 1 tablet by mouth daily.   flecainide (TAMBOCOR) 50 MG tablet Take 1 tablet (50 mg total) by mouth 2 (  two) times daily.   fluticasone (FLONASE) 50 MCG/ACT nasal spray Place 1 spray into both nostrils daily as needed for allergies or rhinitis.   metoprolol tartrate (LOPRESSOR) 25 MG tablet Take 1 tablet (25 mg total) by mouth 2 (two) times daily.   Multiple Vitamin (MULTIVITAMIN WITH MINERALS) TABS tablet Take 1 tablet by mouth daily.   Multiple Vitamins-Minerals (ICAPS AREDS 2 PO) Take 1 capsule by mouth 2 (two) times a day.   omeprazole (PRILOSEC) 20 MG capsule Take 20 mg by mouth daily.   Probiotic Product (PROBIOTIC DAILY PO) Take 1 capsule by mouth daily.   psyllium (METAMUCIL) 58.6 % powder Take 1 packet by mouth 3 (three) times daily.   rosuvastatin (CRESTOR) 10 MG tablet Take 1 tablet (10 mg total) by mouth daily.   cholecalciferol (VITAMIN D3) 25  MCG (1000 UNIT) tablet Take 1,000 Units by mouth daily. (Patient not taking: Reported on 01/12/2021)   [DISCONTINUED] apixaban (ELIQUIS) 5 MG TABS tablet Take 1 tablet (5 mg total) by mouth 2 (two) times daily.   No facility-administered encounter medications on file as of 01/12/2021.   Allergies  Allergen Reactions   Ciprofloxacin Shortness Of Breath and Rash   Scallops [Shellfish Allergy] Anaphylaxis   Demerol Other (See Comments)    Patient states it made her crazy and sleep for 12 hours   Doxycycline Nausea And Vomiting   Milk-Related Compounds Diarrhea   Penicillins Other (See Comments)    Did it involve swelling of the face/tongue/throat, SOB, or low BP? Unknown Did it involve sudden or severe rash/hives, skin peeling, or any reaction on the inside of your mouth or nose? Unknown Did you need to seek medical attention at a hospital or doctor's office? Unknown When did it last happen?       If all above answers are "NO", may proceed with cephalosporin use.    Rivaroxaban Other (See Comments)    Pt unsure but d/c use   Sudafed [Pseudoephedrine Hcl] Other (See Comments)    hyperactive    ROS: denies fever, chills, URI symptoms, cough, shortness of breath, chest pain.   She denies nausea, vomiting, dysphagia.  Denies any bowel changes (just some constipation if takes pain meds) or abdominal pain.  Denies any vaginal bleeding, vaginal discharge. Denies urinary complaints, just slight leakage of urine with sneezing. Denies edema, bleeding, bruising, rashes. Denies depression.   Back pain improved after recent L SI injection Elbow pain improved after injection for tennis elbow per pt. No currently having any shoulder pain, no significant knee pain. Denies palpitations, tachycardia. Occasional heartburn. Belching sometimes in the afternoon/evenings which do not respond to Tums Occasional hot flashes from waist up Has pessary. Denies any vaginal bleeding or discharge Bilateral hearing  aids, vision loss from macular degeneration (some improvement noted, per pt) Memory concern per HPI     PHYSICAL EXAM:  BP 140/80   Pulse 60   Ht '5\' 1"'  (1.549 m)   Wt 161 lb 9.6 oz (73.3 kg)   BMI 30.53 kg/m   Wt Readings from Last 3 Encounters:  01/12/21 161 lb 9.6 oz (73.3 kg)  09/13/20 160 lb (72.6 kg)  04/06/20 160 lb 12.8 oz (72.9 kg)    General Appearance:   Alert, cooperative, no distress, appears stated age  Head:   Normocephalic, without obvious abnormality, atraumatic  Eyes:   PERRL, conjunctiva/corneas clear, EOM's intact, fundi not visualized  Ears:   Hearing aids bilaterally  Nose:   Not examined, wearing mask due  to COVID-19 pandemic  Throat:   Not examined, wearing mask due to COVID-19 pandemic  Neck:   Lipoma left posterior neck, nontender. It is now 5 cm in size, previously measured at 4cm. There is an overlying WHSS.  No lymphadenopathy, thyromegaly or carotid bruit  Back:    Spine nontender, no curvature, ROM normal, no CVA tenderness  Lungs:     Clear to auscultation bilaterally without wheezes, rales or  ronchi; respirations unlabored  Chest Wall:   No tenderness or deformity   Heart:   Regular rate and rhythm, no ectopy. No murmur. Normal S1 and S2  Breast Exam:   Deferred to GYN  Abdomen:     Soft, non-tender, nondistended, normoactive bowel sounds, no masses, no hepatosplenomegaly  Genitalia:   Deferred to GYN       Extremities:   No clubbing, cyanosis or edema  Pulses:   2+ and symmetric all extremities  Skin:   Skin color, texture, turgor normal, no rashes or lesions  Lymph nodes:   Cervical, supraclavicular nodes normal  Neurologic:   Normal strength, sensation and gait; reflexes 2+ and symmetric throughout                          Psych:   Normal mood, affect, hygiene and grooming.                  PHQ-9 score of 5--trouble with sleep and appetite only    ASSESSMENT/PLAN:    Medicare annual wellness visit, subsequent  Aortic atherosclerosis  ([place]) - continue statin  Long term (current) use of anticoagulants - for afib; denies any bleeding - Plan: CBC with Differential/Platelet  Medication monitoring encounter - Plan: CBC with Differential/Platelet, Comprehensive metabolic panel, Lipid panel  Pure hypercholesterolemia - due for recheck; cont statin.  - Plan: Lipid panel  Osteoporosis without current pathological fracture, unspecified osteoporosis type - cont Prolia injections. Discussed calcium recommendations, vit D and weight-bearing exercise. Likely needs Ca more in diet vs supplement  Paroxysmal atrial fibrillation (HCC) - currently in NSR. On BB and anticaog. Under care of cardiology  Lipoma of neck - some increase in size.  Not bothersome or tender.  Has recurred twice, isn't interested in removal at this time  Need for pneumococcal vaccination - Plan: Pneumococcal polysaccharide vaccine 23-valent greater than or equal to 2yo subcutaneous/IM  Gastroesophageal reflux disease without esophagitis - Reviewed proper diet.  Can use 2nd dose of PPI prn belching as Tums isn't effective. May need to use BID more regularly if frequent - Plan: omeprazole (PRILOSEC) 20 MG capsule  Memory change - more trouble with names. No e/o dementia. Reviewed tips. Encouraged physical activity in addition to mental/games  c-met, cbc, lipid   Discussed monthly self breast exams and yearly mammograms; at least 30 minutes of aerobic activity at least 5 days/week and weight-bearing exercise 2x/week; proper sunscreen use reviewed; healthy diet, including goals of calcium and vitamin D intake and alcohol recommendations (less than or equal to 1 drink/day) reviewed; regular seatbelt use; changing batteries in smoke detectors, use of carbon monoxide detectors.  Immunization recommendations discussed--continue yearly high dose flu shots.  pneumovax given today. Colonoscopy recommendations reviewed, no longer needed based on age. Repeat DEXA 11/2021.   Living  Will/HC POA discussed, forms given. MOST form completed, full code, full care.    Medicare Attestation I have personally reviewed: The patient's medical and social history Their use of alcohol, tobacco or illicit drugs  Their current medications and supplements The patient's functional ability including ADLs,fall risks, home safety risks, cognitive, and hearing and visual impairment Diet and physical activities Evidence for depression or mood disorders   The patient's weight, height, BMI have been recorded in the chart.  I have made referrals, counseling, and provided education to the patient based on review of the above and I have provided the patient with a written personalized care plan for preventive services.       Vikki Ports, MD

## 2021-01-12 ENCOUNTER — Other Ambulatory Visit: Payer: Self-pay

## 2021-01-12 ENCOUNTER — Encounter: Payer: Self-pay | Admitting: Family Medicine

## 2021-01-12 ENCOUNTER — Ambulatory Visit (INDEPENDENT_AMBULATORY_CARE_PROVIDER_SITE_OTHER): Payer: Medicare HMO | Admitting: Family Medicine

## 2021-01-12 VITALS — BP 140/80 | HR 60 | Ht 61.0 in | Wt 161.6 lb

## 2021-01-12 DIAGNOSIS — D170 Benign lipomatous neoplasm of skin and subcutaneous tissue of head, face and neck: Secondary | ICD-10-CM

## 2021-01-12 DIAGNOSIS — E7800 Pure hypercholesterolemia, unspecified: Secondary | ICD-10-CM | POA: Diagnosis not present

## 2021-01-12 DIAGNOSIS — Z5181 Encounter for therapeutic drug level monitoring: Secondary | ICD-10-CM

## 2021-01-12 DIAGNOSIS — I480 Paroxysmal atrial fibrillation: Secondary | ICD-10-CM | POA: Diagnosis not present

## 2021-01-12 DIAGNOSIS — Z7901 Long term (current) use of anticoagulants: Secondary | ICD-10-CM

## 2021-01-12 DIAGNOSIS — M810 Age-related osteoporosis without current pathological fracture: Secondary | ICD-10-CM

## 2021-01-12 DIAGNOSIS — Z0000 Encounter for general adult medical examination without abnormal findings: Secondary | ICD-10-CM

## 2021-01-12 DIAGNOSIS — I700 Atherosclerosis of aorta: Secondary | ICD-10-CM

## 2021-01-12 DIAGNOSIS — K219 Gastro-esophageal reflux disease without esophagitis: Secondary | ICD-10-CM

## 2021-01-12 DIAGNOSIS — R413 Other amnesia: Secondary | ICD-10-CM

## 2021-01-12 DIAGNOSIS — Z23 Encounter for immunization: Secondary | ICD-10-CM

## 2021-01-12 DIAGNOSIS — G8929 Other chronic pain: Secondary | ICD-10-CM

## 2021-01-12 MED ORDER — OMEPRAZOLE 20 MG PO CPDR: 20.0000 mg | DELAYED_RELEASE_CAPSULE | Freq: Every day | ORAL | 3 refills | Status: DC

## 2021-01-13 ENCOUNTER — Encounter: Payer: Self-pay | Admitting: Family Medicine

## 2021-01-13 LAB — COMPREHENSIVE METABOLIC PANEL
ALT: 18 IU/L (ref 0–32)
AST: 22 IU/L (ref 0–40)
Albumin/Globulin Ratio: 2 (ref 1.2–2.2)
Albumin: 4.3 g/dL (ref 3.6–4.6)
Alkaline Phosphatase: 72 IU/L (ref 44–121)
BUN/Creatinine Ratio: 13 (ref 12–28)
BUN: 11 mg/dL (ref 8–27)
Bilirubin Total: 0.4 mg/dL (ref 0.0–1.2)
CO2: 25 mmol/L (ref 20–29)
Calcium: 9.3 mg/dL (ref 8.7–10.3)
Chloride: 103 mmol/L (ref 96–106)
Creatinine, Ser: 0.82 mg/dL (ref 0.57–1.00)
Globulin, Total: 2.2 g/dL (ref 1.5–4.5)
Glucose: 89 mg/dL (ref 65–99)
Potassium: 3.9 mmol/L (ref 3.5–5.2)
Sodium: 144 mmol/L (ref 134–144)
Total Protein: 6.5 g/dL (ref 6.0–8.5)
eGFR: 69 mL/min/{1.73_m2} (ref >59)

## 2021-01-13 LAB — CBC WITH DIFFERENTIAL/PLATELET
Basophils Absolute: 0.1 10*3/uL (ref 0.0–0.2)
Basos: 1 %
EOS (ABSOLUTE): 0 10*3/uL (ref 0.0–0.4)
Eos: 0 %
Hematocrit: 38.9 % (ref 34.0–46.6)
Hemoglobin: 12.8 g/dL (ref 11.1–15.9)
Immature Grans (Abs): 0 10*3/uL (ref 0.0–0.1)
Immature Granulocytes: 0 %
Lymphocytes Absolute: 1.4 10*3/uL (ref 0.7–3.1)
Lymphs: 14 %
MCH: 29.6 pg (ref 26.6–33.0)
MCHC: 32.9 g/dL (ref 31.5–35.7)
MCV: 90 fL (ref 79–97)
Monocytes Absolute: 0.9 10*3/uL (ref 0.1–0.9)
Monocytes: 9 %
Neutrophils Absolute: 7.6 10*3/uL — ABNORMAL HIGH (ref 1.4–7.0)
Neutrophils: 76 %
Platelets: 214 10*3/uL (ref 150–450)
RBC: 4.33 x10E6/uL (ref 3.77–5.28)
RDW: 12.7 % (ref 11.7–15.4)
WBC: 10 10*3/uL (ref 3.4–10.8)

## 2021-01-13 LAB — LIPID PANEL
Chol/HDL Ratio: 2.6 ratio (ref 0.0–4.4)
Cholesterol, Total: 141 mg/dL (ref 100–199)
HDL: 54 mg/dL (ref >39)
LDL Chol Calc (NIH): 67 mg/dL (ref 0–99)
Triglycerides: 110 mg/dL (ref 0–149)
VLDL Cholesterol Cal: 20 mg/dL (ref 5–40)

## 2021-01-18 ENCOUNTER — Telehealth: Payer: Self-pay | Admitting: Family Medicine

## 2021-01-18 NOTE — Telephone Encounter (Signed)
Called pt to inform time for her next Prolia injection. Her estimated cost is $275 which is same as last time. Pt states she no longer drives and will have to contact her daughter to bring her. Pt was advise that the earliest she can schedule is 01/26/2021 and that I will need to order medication. Pt states she will call me back and advise.

## 2021-01-20 DIAGNOSIS — H353211 Exudative age-related macular degeneration, right eye, with active choroidal neovascularization: Secondary | ICD-10-CM | POA: Diagnosis not present

## 2021-01-20 DIAGNOSIS — H353122 Nonexudative age-related macular degeneration, left eye, intermediate dry stage: Secondary | ICD-10-CM | POA: Diagnosis not present

## 2021-02-01 ENCOUNTER — Other Ambulatory Visit: Payer: Self-pay | Admitting: *Deleted

## 2021-02-01 MED ORDER — APIXABAN 5 MG PO TABS: 5.0000 mg | ORAL_TABLET | Freq: Two times a day (BID) | ORAL | 0 refills | Status: DC

## 2021-02-01 NOTE — Telephone Encounter (Signed)
Pt last saw Dr Caryl Comes 09/13/20, last labs 01/12/21 Creat 0.82, age 88, weight 73.3kg, based on specified criteria pt is on appropriate dosage of Eliquis '5mg'$  QD.  Will refill rx.

## 2021-02-01 NOTE — Telephone Encounter (Signed)
Prescription refill request for Eliquis received. Indication: afib Last office visit: 09/13/20 Caryl Comes) Scr: 0.82 (01/12/21) Age: 87 Weight: 73.3kg  Appropriate dose and refill sent to requested pharmacy.

## 2021-02-03 ENCOUNTER — Other Ambulatory Visit: Payer: Medicare HMO

## 2021-02-03 ENCOUNTER — Other Ambulatory Visit: Payer: Self-pay

## 2021-02-03 DIAGNOSIS — M8580 Other specified disorders of bone density and structure, unspecified site: Secondary | ICD-10-CM

## 2021-02-03 MED ORDER — DENOSUMAB 60 MG/ML ~~LOC~~ SOSY
60.0000 mg | PREFILLED_SYRINGE | Freq: Once | SUBCUTANEOUS | Status: AC
  Administered 2021-02-03: 60 mg via SUBCUTANEOUS

## 2021-03-01 ENCOUNTER — Other Ambulatory Visit: Payer: Self-pay | Admitting: Family Medicine

## 2021-03-01 DIAGNOSIS — I700 Atherosclerosis of aorta: Secondary | ICD-10-CM

## 2021-03-10 DIAGNOSIS — H353211 Exudative age-related macular degeneration, right eye, with active choroidal neovascularization: Secondary | ICD-10-CM | POA: Diagnosis not present

## 2021-03-16 ENCOUNTER — Telehealth: Payer: Self-pay | Admitting: Family Medicine

## 2021-03-16 NOTE — Chronic Care Management (AMB) (Signed)
  Chronic Care Management   Note  03/16/2021 Name: Samantha Foley MRN: [id number] DOB: [birth year]  Samantha Foley is a 87 y.o. year old female who is a primary care patient of Rita Ohara, MD. I reached out to Beau Fanny by phone today in response to a referral sent by Ms. Haskell Riling PCP, Rita Ohara, MD.   Ms. Mckercher was given information about Chronic Care Management services today including:  CCM service includes personalized support from designated clinical staff supervised by her physician, including individualized plan of care and coordination with other care providers 24/7 contact phone numbers for assistance for urgent and routine care needs. Service will only be billed when office clinical staff spend 20 minutes or more in a month to coordinate care. Only one practitioner may furnish and bill the service in a calendar month. The patient may stop CCM services at any time (effective at the end of the month) by phone call to the office staff.   Patient agreed to services and verbal consent obtained.   Follow up plan:   Tatjana Secretary/administrator

## 2021-03-30 ENCOUNTER — Telehealth: Payer: Self-pay | Admitting: *Deleted

## 2021-03-30 NOTE — Telephone Encounter (Signed)
Patient called and said she had heard that sildenifil helped with cognitive issues-she was wondering if this is something you would rx? If so, she will schedule an OV.

## 2021-03-30 NOTE — Telephone Encounter (Signed)
This is not something I'm familiar with or comfortable prescribing for this reason.  Please advise

## 2021-03-31 NOTE — Telephone Encounter (Signed)
Pt was notified.  

## 2021-04-25 DIAGNOSIS — M545 Low back pain, unspecified: Secondary | ICD-10-CM | POA: Diagnosis not present

## 2021-04-28 ENCOUNTER — Telehealth: Payer: Self-pay | Admitting: Pharmacist

## 2021-04-28 DIAGNOSIS — H353211 Exudative age-related macular degeneration, right eye, with active choroidal neovascularization: Secondary | ICD-10-CM | POA: Diagnosis not present

## 2021-04-28 NOTE — Chronic Care Management (AMB) (Signed)
\   Chronic Care Management Pharmacy Assistant   Name: Samantha Foley  MRN: [id number] DOB: 11-Feb-1930  Samantha Foley is an 87 y.o. year old female who presents for herinitial CCM visit with the clinical pharmacist.  Reason for Encounter: Chart Prep for initial visit with Jeni Salles, Pharm D. on 05/04/21.    Conditions to be addressed/monitored: Atrial Fibrillation, GERD, Osteoporosis, Osteopenia, and Aortic atherosclerosis, Cholangitis, cholelithiasis with choledocholithiasis, impaired fasting glucose, pure hypercholesterolemia, age related macular degeneration and insomnia.    Recent office visits:  01/12/21 Rita Ohara MD (PCP) - seen for medicare annual wellness visit and other chronic conditions. Discontinued cholecalciferol 1000 units. Follow up in 1 year.   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 Eye injections performed of aflibercept.   01/10/21 Iran Planas MD (Orthopedic Surgery) - seen for injection in tendon of elbow. No follow up noted.   12/06/20 Susa Day (Orthopedic Surgery) - seen for low back pain. Trigger point injection given. No follow up noted.   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 Eye injections performed of aflibercept.   10/27/20 Lacie Draft MD (Orthopedic Surgery) - seen for radiculopathy of low back and lower back pain. Injections given into trigger point in office. No follow up noted.   Hospital visits:  None in previous 6 months  Medications: Outpatient Encounter Medications as of 04/28/2021  Medication Sig   apixaban (ELIQUIS) 5 MG TABS tablet Take 1 tablet (5 mg  total) by mouth 2 (two) times daily.   Apoaequorin (PREVAGEN) 10 MG CAPS Take 1 capsule by mouth daily.   Cyanocobalamin (VITAMIN B-12 PO) Take 1 tablet by mouth daily.   flecainide (TAMBOCOR) 50 MG tablet Take 1 tablet (50 mg total) by mouth 2 (two) times daily.   fluticasone (FLONASE) 50 MCG/ACT nasal spray Place 1 spray into both nostrils daily as needed for allergies or rhinitis.   metoprolol tartrate (LOPRESSOR) 25 MG tablet Take 1 tablet (25 mg total) by mouth 2 (two) times daily.   Multiple Vitamin (MULTIVITAMIN WITH MINERALS) TABS tablet Take 1 tablet by mouth daily.   Multiple Vitamins-Minerals (ICAPS AREDS 2 PO) Take 1 capsule by mouth 2 (two) times a day.   omeprazole (PRILOSEC) 20 MG capsule Take 1 capsule (20 mg total) by mouth daily.   Probiotic Product (PROBIOTIC DAILY PO) Take 1 capsule by mouth daily.   psyllium (METAMUCIL) 58.6 % powder Take 1 packet by mouth 3 (three) times daily.   rosuvastatin (CRESTOR) 10 MG tablet TAKE 1 TABLET DAILY   No facility-administered encounter medications on file as of 04/28/2021.   Fill History: ELIQUIS 5MG  TAB 04/01/2021 12   FLECAINIDE 50MG  TAB 04/26/2021 90   METOPROLOL TARTRATE 25MG  TAB 03/05/2021 90   OMEPRAZOL RX 20MG  CAP 04/05/2021 90   ROSUVASTATIN CALCIUM 10MG  TAB 03/07/2021 90   Initial Questions: Have you seen any other providers since your last visit? Yes. Her Ophthalmologist.  Any changes in your medications or health? No changes on regular medications. Patient got a flu shot and had a reaction. She received it two days ago. Feeling better today.   Any side effects from any medications? Patient got a flu shot and had a reaction. She received it  two days ago. Feeling better today.   Do you have any symptoms or problems not managed by your medications? Patient has spinal stenosis and she states sometimes tylenol works and sometimes it don't. She doesn't take her prescription pain medication like she should. Is there  something she can take besides tylenol or her opioid medications to help?   Any concerns about your health right now? Not really any concerns. She thinks she is doing well for her age. She does state that she's having trouble remembering names lately. Is there something natural she can take to help with this?  Has your provider asked that you check blood pressure, blood sugar, or follow special diet at home? None of the above.  Do you get any type of exercise on a regular basis? She tries but with her spinal stenosis it makes it hard. She does do things around her home.  Can you think of a goal you would like to reach for your health? Patient would like to improve her back pain and not be forgetting things.  Do you have any problems getting your medications? No issues right now but she stated her Eliquis is getting expensive.   Is there anything that you would like to discuss during the appointment? I am completing patient an Eliquis application for patient assistance. Patient is aware. She would like to discuss her back pain and memory issues lately.   Patient was reminded of her appointment Jeni Salles, Pharm D on 05/04/21 at 1:30pm via telephone. Patient reminded to Please have medications and supplements with her close by.  Patient assistance application for Eliquis completed and to be mailed on 05/05/21. Patient thanked me for my call.    Care Gaps:  AWV - scheduled for 01/18/22 COVID-19 vaccine booster 5 - overdue since 12/22/20 Flu vaccine - due   Star Rating Drugs:  Rosuvastatin 10mg  - last filled on 03/07/21 90DS at [facility] [phone]

## 2021-05-01 NOTE — Telephone Encounter (Signed)
2nd attempt

## 2021-05-04 ENCOUNTER — Ambulatory Visit (INDEPENDENT_AMBULATORY_CARE_PROVIDER_SITE_OTHER): Payer: Medicare HMO | Admitting: Pharmacist

## 2021-05-04 DIAGNOSIS — I480 Paroxysmal atrial fibrillation: Secondary | ICD-10-CM

## 2021-05-04 DIAGNOSIS — I700 Atherosclerosis of aorta: Secondary | ICD-10-CM

## 2021-05-04 NOTE — Patient Instructions (Signed)
Hi Samantha Foley,  It was great to get to meet you over the telephone! Below is a summary of some of the topics we discussed.   Don't forget to go ahead and try the half tablet of trazodone to see if this helps with your sleep and make sure to not take it with the Benadryl as we discussed.  Also for your allergies, you can feel free to try the Zyrtec or Claritin to see if this helps.  Please reach out to me if you have any questions or need anything before our follow up!  Best, Maddie  Jeni Salles, PharmD, [place] [phone]   Visit Information   Goals Addressed   None    Patient Care Plan: CCM Pharmacy Care Plan     Problem Identified: Problem: Hyperlipidemia, Atrial Fibrillation, GERD, Osteoporosis, and Allergic Rhinitis      Long-Range Goal: Patient-Specific Goal   Start Date: 05/04/2021  Expected End Date: 05/04/2022  This Visit's Progress: On track  Priority: High  Note:   Current Barriers:  Unable to independently afford treatment regimen Unable to independently monitor therapeutic efficacy  Pharmacist Clinical Goal(s):  Patient will verbalize ability to afford treatment regimen achieve adherence to monitoring guidelines and medication adherence to achieve therapeutic efficacy through collaboration with PharmD and provider.   Interventions: 1:1 collaboration with Rita Ohara, MD regarding development and update of comprehensive plan of care as evidenced by provider attestation and co-signature Inter-disciplinary care team collaboration (see longitudinal plan of care) Comprehensive medication review performed; medication list updated in electronic medical record  Aortic atherosclerosis: (LDL goal < 70) -Controlled -Current treatment: Rosuvastatin 10 mg 1 tablet daily -Medications previously tried: none  -Current dietary patterns: not much of an appetite -Current exercise habits: unable to due to pain -Educated on  Cholesterol goals;  -Counseled on diet and exercise extensively Recommended to continue current medication  Atrial Fibrillation (Goal: prevent stroke and major bleeding) -Controlled -CHADSVASC: 4 -Current treatment: Rate control: flecainide 50 mg 1 tablet twice daily; metoprolol tartrate 25 mg 1 tablet twice daily Anticoagulation: Eliquis 5 mg 1 tablet twice daily -Medications previously tried: none -Home BP and HR readings: 65 (usually around 50-70s)  -Counseled on increased risk of stroke due to Afib and benefits of anticoagulation for stroke prevention; importance of adherence to anticoagulant exactly as prescribed; bleeding risk associated with Eliquis and importance of self-monitoring for signs/symptoms of bleeding; -Counseled on diet and exercise extensively Recommended to continue current medication  Osteoporosis (Goal prevent fractures) -Controlled -Last DEXA Scan: 12/02/19   T-Score femoral neck: -2.7  T-Score total hip: n/a  T-Score lumbar spine: n/a  T-Score forearm radius: -2.0  10-year probability of major osteoporotic fracture: n/a  10-year probability of hip fracture: n/a -Patient is a candidate for pharmacologic treatment due to T-Score < -2.5 in femoral neck -Current treatment  Multivitamin (vitamin D 1000 units, calcium 300 mg) Prolia inject every 6 months (last 02/03/21) -Medications previously tried: Forteo, bisphosphonate -Recommend [phone] units of vitamin D daily. Recommend 1200 mg of calcium daily from dietary and supplemental sources. Recommend weight-bearing and muscle strengthening exercises for building and maintaining bone density. -Counseled on diet and exercise extensively Recommended to continue current medication Recommended drinking at least 1 glass of milk every day.  GERD (Goal: minimize symptoms) -Controlled -Current treatment  Omeprazole 20 mg 1 capsule daily - in the morning before eating Tums as needed -Medications previously tried: none   -Collaborated with PCP to consider taper off omeprazole due  to lack of symptoms.  Allergic rhinitis (Goal: minimize symptoms) -Uncontrolled -Current treatment  Flonase 50 mcg/act 1 spray in both nostrils as needed -Medications previously tried: none  -Recommended trial of Zyrtec or Claritin for symptoms.  Health Maintenance -Vaccine gaps:  COVID booster -Current therapy:  Vitamin B12 daily Prevagen 10 mg 1 capsule daily Multivitamin 1 tablet daily Probiotic daily Metamucil as needed Areds 2 1 capsule twice a day -Educated on Herbal supplement research is limited and benefits usually cannot be proven Cost vs benefit of each product must be carefully weighed by individual consumer -Patient is satisfied with current therapy and denies issues -Counseled on lack of benefit with Prevagen and patient will stop due to cost.  Patient Goals/Self-Care Activities Patient will:  - take medications as prescribed as evidenced by patient report and record review  Follow Up Plan: The care management team will reach out to the patient again over the next 30 days.       [name] was given information about Chronic Care Management services today including:  CCM service includes personalized support from designated clinical staff supervised by her physician, including individualized plan of care and coordination with other care providers 24/7 contact phone numbers for assistance for urgent and routine care needs. Standard insurance, coinsurance, copays and deductibles apply for chronic care management only during months in which we provide at least 20 minutes of these services. Most insurances cover these services at 100%, however patients may be responsible for any copay, coinsurance and/or deductible if applicable. This service may help you avoid the need for more expensive face-to-face services. Only one practitioner may furnish and bill the service in a calendar month. The patient may stop  CCM services at any time (effective at the end of the month) by phone call to the office staff.  Patient agreed to services and verbal consent obtained.   The patient verbalized understanding of instructions, educational materials, and care plan provided today and agreed to receive a mailed copy of patient instructions, educational materials, and care plan.  The pharmacy team will reach out to the patient again over the next 30 days.   Viona Gilmore, [facility]

## 2021-05-04 NOTE — Progress Notes (Signed)
Chronic Care Management Pharmacy Note  05/04/2021 Name:  Samantha Foley MRN:  [id number] DOB:  [birth year]  Summary: LDL at goal < 70 Pt is still having trouble sleeping  Recommendations/Changes made from today's visit: -Recommended stopping Prevagen due to lack of benefit -Consider taper of PPI without symptoms of GERD -Recommended trial of Zyrtec or Claritin for allergy symptoms -Recommended trial of 1/2 tablet of trazodone for sleep and limiting use of Benadryl   Plan: Apply for PAP for Eliquis Follow up sleep assessment   Subjective: Samantha Foley is an 87 y.o. year old female who is a primary patient of Rita Ohara, MD.  The CCM team was consulted for assistance with disease management and care coordination needs.    Engaged with patient by telephone for initial visit in response to provider referral for pharmacy case management and/or care coordination services.   Consent to Services:  The patient was given the following information about Chronic Care Management services today, agreed to services, and gave verbal consent: 1. CCM service includes personalized support from designated clinical staff supervised by the primary care provider, including individualized plan of care and coordination with other care providers 2. 24/7 contact phone numbers for assistance for urgent and routine care needs. 3. Service will only be billed when office clinical staff spend 20 minutes or more in a month to coordinate care. 4. Only one practitioner may furnish and bill the service in a calendar month. 5.The patient may stop CCM services at any time (effective at the end of the month) by phone call to the office staff. 6. The patient will be responsible for cost sharing (co-pay) of up to 20% of the service fee (after annual deductible is met). Patient agreed to services and consent obtained.  Patient Care Team: Rita Ohara, MD as PCP - General (Family Medicine) Deboraha Sprang, MD as PCP -  Electrophysiology (Cardiology) Viona Gilmore, [facility] as Pharmacist (Pharmacist)  Recent office visits: 01/12/21 Rita Ohara MD (PCP) - seen for medicare annual wellness visit and other chronic conditions. Discontinued cholecalciferol 1000 units. Follow up in 1 year.   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 Eye injections performed of aflibercept.    01/10/21 Iran Planas MD (Orthopedic Surgery) - seen for injection in tendon of elbow. No follow up noted.    12/06/20 Susa Day (Orthopedic Surgery) - seen for low back pain. Trigger point injection given. No follow up noted.    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 Eye injections performed of aflibercept.    10/27/20 Lacie Draft MD (Orthopedic Surgery) - seen for radiculopathy of low back and lower back pain. Injections given into trigger point in office. No follow up noted.   Hospital visits: None in previous 6 months   Objective:  Lab Results  Component Value Date   CREATININE 0.82 01/12/2021   BUN 11 01/12/2021   GFRNONAA 80 04/06/2020   GFRAA 92 04/06/2020   NA 144 01/12/2021   K 3.9 01/12/2021   CALCIUM 9.3 01/12/2021   CO2 25 01/12/2021   GLUCOSE 89 01/12/2021    Lab Results  Component Value Date/Time   HGBA1C 5.7 (H) 08/15/2011 10:15 AM    Last diabetic Eye  exam: No results found for: HMDIABEYEEXA  Last diabetic Foot exam: No results found for: HMDIABFOOTEX   Lab Results  Component Value Date   CHOL 141 01/12/2021   HDL 54 01/12/2021   LDLCALC 67 01/12/2021   TRIG 110 01/12/2021   CHOLHDL 2.6 01/12/2021    Hepatic Function Latest Ref  Rng & Units 01/12/2021 01/19/2020 09/16/2019  Total Protein 6.0 - 8.5 g/dL 6.5 6.8 6.6  Albumin 3.6 - 4.6 g/dL 4.3 4.2 4.1  AST 0 - 40 IU/L '22 21 19  ' ALT 0 - 32 IU/L '18 15 15  ' Alk Phosphatase 44 - 121 IU/L 72 108 123(H)  Total Bilirubin 0.0 - 1.2 mg/dL 0.4 0.6 0.2  Bilirubin, Direct 0.0 - 0.3 mg/dL - - -    Lab Results  Component Value Date/Time   TSH 1.400 04/06/2020 04:26 PM   TSH 1.410 09/16/2019 01:00 PM    CBC Latest Ref Rng & Units 01/12/2021 04/06/2020 09/16/2019  WBC 3.4 - 10.8 x10E3/uL 10.0 6.9 8.2  Hemoglobin 11.1 - 15.9 g/dL 12.8 13.2 13.4  Hematocrit 34.0 - 46.6 % 38.9 39.1 40.7  Platelets 150 - 450 x10E3/uL 214 175 226    Lab Results  Component Value Date/Time   VD25OH 40.2 09/16/2019 01:00 PM    Clinical ASCVD: Yes  The ASCVD Risk score (Arnett DK, et al., 2019) failed to calculate for the following reasons:   The 2019 ASCVD risk score is only valid for ages 85 to 24    Depression screen PHQ 2/9 01/12/2021 11/16/2019  Decreased Interest 0 0  Down, Depressed, Hopeless 0 3  PHQ - 2 Score 0 3  Altered sleeping 2 2  Tired, decreased energy 0 1  Change in appetite 3 3  Feeling bad or failure about yourself  0 0  Trouble concentrating 0 0  Moving slowly or fidgety/restless 0 0  Suicidal thoughts 0 0  PHQ-9 Score 5 9  Difficult doing work/chores Not difficult at all -  Some recent data might be hidden     CHA2DS2/VAS Stroke Risk Points  Current as of about an hour ago     4 >= 2 Points: High Risk  1 - 1.99 Points: Medium Risk  0 Points: Low Risk    Last Change: N/A      Details    This score determines the patient's risk of having a stroke if the  patient has atrial fibrillation.       Points Metrics  0 Has Congestive Heart Failure:  No    Current as of about an hour ago  0 Has Vascular Disease:  No    Current as of about an hour ago  1 Has Hypertension:  Yes     Current as of about an hour ago  2 Age:  87    Current as of about an hour ago  0 Has  Diabetes:  No    Current as of about an hour ago  0 Had Stroke:  No  Had TIA:  No  Had Thromboembolism:  No    Current as of about an hour ago  1 Female:  Yes    Current as of about an hour ago      Social History   Tobacco Use  Smoking Status Never  Smokeless Tobacco Never   BP Readings from Last 3 Encounters:  01/12/21 140/80  09/13/20 116/74  04/06/20 118/70   Pulse Readings from Last 3 Encounters:  01/12/21 60  09/13/20 65  04/06/20 60   Wt Readings from Last 3 Encounters:  01/12/21 161 lb 9.6 oz (73.3 kg)  09/13/20 160 lb (72.6 kg)  04/06/20 160 lb 12.8 oz (72.9 kg)   BMI Readings from Last 3 Encounters:  01/12/21 30.53 kg/m  09/13/20 30.23 kg/m  04/06/20 30.38 kg/m    Assessment/Interventions: Review of patient past medical history, allergies, medications, health status, including review of consultants reports, laboratory and other test data, was performed as part of comprehensive evaluation and provision of chronic care management services.   SDOH:  (Social Determinants of Health) assessments and interventions performed: Yes SDOH Interventions    Flowsheet Row Most Recent Value  SDOH Interventions   Financial Strain Interventions Other (Comment)  [working on Eliquis PAP]  Transportation Interventions Intervention Not Indicated      SDOH Screenings   Alcohol Screen: Not on file  Depression (PHQ2-9): Medium Risk   PHQ-2 Score: 5  Financial Resource Strain: Low Risk    Difficulty of Paying Living Expenses: Not very hard  Food Insecurity: Not on file  Housing: Not on file  Physical Activity: Not on file  Social Connections: Not on file  Stress: Not on file  Tobacco Use: Low Risk    Smoking Tobacco Use: Never   Smokeless Tobacco Use: Never   Passive Exposure: Not on file  Transportation Needs: No Transportation Needs   Lack of Transportation (Medical): No   Lack of Transportation (Non-Medical): No   Patient takes the Benadryl 25 mg for sleep.  Patient has more trouble with falling and staying asleep. Patient sleeps for about 3 hours and then wakes back up. She sometimes wakes back up. Patient was taking a full tablet of trazodone 50 mg and she felt groggy for too long.  Patient has had to stop driving and has macular degeneration and she is home bound. Her spinal stenosis has gotten worse and is in pain all the time. Her doctor gave her tips for ways to relieve the pressure on her spine.   Patient's daughter and her family brings her to appointments. Patient loves Dr. Tomi Bamberger as her doctor.  She does cross word puzzles and reading a lot and keeps herself. She does take care of herself all by herself.  She doesn't sit all day and this would make her go crazy.  Patient is originally from [place] and her daughter is coming down from [place].  Patient doesn't have an appetite right now.  CCM Care Plan  Allergies  Allergen Reactions   Ciprofloxacin Shortness Of Breath and Rash   Scallops [Shellfish Allergy] Anaphylaxis   Demerol Other (See Comments)    Patient states it made her crazy and sleep for 12 hours   Doxycycline Nausea And Vomiting   Milk-Related Compounds Diarrhea   Penicillins Other (See Comments)    Did it involve swelling of the face/tongue/throat, SOB, or low BP? Unknown Did it involve sudden or severe rash/hives, skin peeling, or any reaction on the inside of your mouth or nose? Unknown Did you need to seek medical attention at a hospital or doctor's office? Unknown When did it last happen?       If all above answers are "NO", may proceed with cephalosporin use.    Rivaroxaban Other (See Comments)    Pt unsure but d/c use   Sudafed [Pseudoephedrine Hcl] Other (See Comments)    hyperactive    Medications Reviewed Today     Reviewed by Viona Gilmore, [facility] (Pharmacist) on 05/04/21 at  1428  Med List Status: <None>   Medication Order Taking? Sig Documenting Provider Last Dose Status Informant  apixaban  (ELIQUIS) 5 MG TABS tablet [id number] Yes Take 1 tablet (5 mg total) by mouth 2 (two) times daily. Deboraha Sprang, MD Taking Active   Apoaequorin [facility]) 10 MG CAPS [id number] Yes Take 1 capsule by mouth daily. [provider] Taking Active   flecainide (TAMBOCOR) 50 MG tablet [id number] Yes Take 1 tablet (50 mg total) by mouth 2 (two) times daily. Sherran Needs, NP Taking Active   fluticasone [facility]) 50 MCG/ACT nasal spray [id number] Yes Place 1 spray into both nostrils daily as needed for allergies or rhinitis. [provider] Taking Active Self  HYDROcodone-acetaminophen (NORCO/VICODIN) 5-325 MG tablet [id number] Yes Take 1 tablet by mouth every 6 (six) hours as needed for moderate pain. [provider] Taking Active   metoprolol tartrate (LOPRESSOR) 25 MG tablet [id number] Yes Take 1 tablet (25 mg total) by mouth 2 (two) times daily. Deboraha Sprang, MD Taking Active   Multiple Vitamin (MULTIVITAMIN WITH MINERALS) TABS tablet [id number] Yes Take 1 tablet by mouth daily. [provider] Taking Active Self  Multiple Vitamins-Minerals (ICAPS AREDS 2 PO) [id number] Yes Take 1 capsule by mouth 2 (two) times a day. [provider] Taking Active Self  omeprazole (PRILOSEC) 20 MG capsule [id number] Yes Take 1 capsule (20 mg total) by mouth daily. Rita Ohara, MD Taking Active   Probiotic Product (PROBIOTIC DAILY PO) [id number] Yes Take 1 capsule by mouth daily. [provider] Taking Active   psyllium (METAMUCIL) 58.6 % powder [id number] Yes Take 1 packet by mouth daily. [provider] Taking Active   rosuvastatin (CRESTOR) 10 MG tablet [id number] Yes TAKE 1 TABLET DAILY Rita Ohara, MD Taking Active   vitamin B-12 (CYANOCOBALAMIN) 1000 MCG tablet [id number] Yes Take 1 tablet by mouth daily. [provider] Taking Active Self            Patient Active Problem List   Diagnosis Date Noted   Aortic atherosclerosis ([place]) 09/20/2019    Chronic right shoulder pain 09/20/2019   Insomnia 09/20/2019   Osteoporosis without current pathological fracture 09/20/2019   History of vertebroplasty 09/20/2019   Cholelithiasis with choledocholithiasis 11/06/2018   Cholangitis 11/01/2018   Sepsis ([place]) 11/01/2018   Encounter for therapeutic drug monitoring 04/15/2017   S/P total hysterectomy - Davinci 03/09/2016   Postoperative state 03/08/2016   Age-related macular degeneration 06/28/2015   Disorder of rotator cuff 06/28/2015   Degenerative drusen 07/11/2014   Chest pain 12/16/2013   Shoulder injury 12/18/2012   Lipoma of neck 02/27/2012   GERD (gastroesophageal reflux disease) 08/08/2011   Impaired fasting glucose 08/08/2011   Pure hypercholesterolemia 08/08/2011   Osteopenia 08/08/2011   Atrial fibrillation ([place]) 08/08/2011    Immunization History  Administered Date(s) Administered   Fluad Quad(high Dose 65+) 03/01/2019   Influenza Split 02/27/2012, 04/20/2020   Influenza, High Dose Seasonal PF 03/18/2018, 05/01/2021   Influenza,inj,quad, With Preservative 03/18/2018, 03/01/2019   Influenza-Unspecified 03/13/2017   PFIZER(Purple Top)SARS-COV-2 Vaccination 07/30/2019, 08/20/2019, 03/27/2020, 10/27/2020   Pneumococcal Conjugate-13 01/24/2015   Pneumococcal Polysaccharide-23 04/26/2004, 01/12/2021   Td 11/26/1994, 12/23/2005   Tdap 03/07/2011, 01/24/2015   Zoster Recombinat (Shingrix) 03/06/2018, 03/27/2020   Zoster, Live 07/16/2005    Conditions to be addressed/monitored:  Hyperlipidemia, Atrial Fibrillation, GERD, Osteoporosis, and Allergic Rhinitis  Care Plan : [place]  Updates made by Viona Gilmore, [place] since 05/04/2021 12:00 AM  Problem: Problem: Hyperlipidemia, Atrial Fibrillation, GERD, Osteoporosis, and Allergic Rhinitis      Long-Range Goal: Patient-Specific Goal   Start Date: 05/04/2021  Expected End Date: 05/04/2022  This Visit's Progress: On track  Priority: High  Note:    Current Barriers:  Unable to independently afford treatment regimen Unable to independently monitor therapeutic efficacy  Pharmacist Clinical Goal(s):  Patient will verbalize ability to afford treatment regimen achieve adherence to monitoring guidelines and medication adherence to achieve therapeutic efficacy through collaboration with PharmD and provider.   Interventions: 1:1 collaboration with Rita Ohara, MD regarding development and update of comprehensive plan of care as evidenced by provider attestation and co-signature Inter-disciplinary care team collaboration (see longitudinal plan of care) Comprehensive medication review performed; medication list updated in electronic medical record  Aortic atherosclerosis: (LDL goal < 70) -Controlled -Current treatment: Rosuvastatin 10 mg 1 tablet daily -Medications previously tried: none  -Current dietary patterns: not much of an appetite -Current exercise habits: unable to due to pain -Educated on Cholesterol goals;  -Counseled on diet and exercise extensively Recommended to continue current medication  Atrial Fibrillation (Goal: prevent stroke and major bleeding) -Controlled -CHADSVASC: 4 -Current treatment: Rate control: flecainide 50 mg 1 tablet twice daily; metoprolol tartrate 25 mg 1 tablet twice daily Anticoagulation: Eliquis 5 mg 1 tablet twice daily -Medications previously tried: none -Home BP and HR readings: 65 (usually around 50-70s)  -Counseled on increased risk of stroke due to Afib and benefits of anticoagulation for stroke prevention; importance of adherence to anticoagulant exactly as prescribed; bleeding risk associated with Eliquis and importance of self-monitoring for signs/symptoms of bleeding; -Counseled on diet and exercise extensively Recommended to continue current medication  Osteoporosis (Goal prevent fractures) -Controlled -Last DEXA Scan: 12/02/19   T-Score femoral neck: -2.7  T-Score total hip:  n/a  T-Score lumbar spine: n/a  T-Score forearm radius: -2.0  10-year probability of major osteoporotic fracture: n/a  10-year probability of hip fracture: n/a -Patient is a candidate for pharmacologic treatment due to T-Score < -2.5 in femoral neck -Current treatment  Multivitamin (vitamin D 1000 units, calcium 300 mg) Prolia inject every 6 months (last 02/03/21) -Medications previously tried: Forteo, bisphosphonate -Recommend [phone] units of vitamin D daily. Recommend 1200 mg of calcium daily from dietary and supplemental sources. Recommend weight-bearing and muscle strengthening exercises for building and maintaining bone density. -Counseled on diet and exercise extensively Recommended to continue current medication Recommended drinking at least 1 glass of milk every day.  GERD (Goal: minimize symptoms) -Controlled -Current treatment  Omeprazole 20 mg 1 capsule daily - in the morning before eating Tums as needed -Medications previously tried: none  -Collaborated with PCP to consider taper off omeprazole due to lack of symptoms.  Allergic rhinitis (Goal: minimize symptoms) -Uncontrolled -Current treatment  Flonase 50 mcg/act 1 spray in both nostrils as needed -Medications previously tried: none  -Recommended trial of Zyrtec or Claritin for symptoms.  Health Maintenance -Vaccine gaps:  COVID booster -Current therapy:  Vitamin B12 daily Prevagen 10 mg 1 capsule daily Multivitamin 1 tablet daily Probiotic daily Metamucil as needed Areds 2 1 capsule twice a day -Educated on Herbal supplement research is limited and benefits usually cannot be proven Cost vs benefit of each product must be carefully weighed by individual consumer -Patient is satisfied with current therapy and denies issues -Counseled on lack of benefit with Prevagen and patient will stop due to cost.  Patient Goals/Self-Care Activities Patient will:  - take medications as prescribed as evidenced by patient  report and record review  Follow Up Plan: The care management team will reach out to the patient again over the next 30 days.        Medication Assistance: None required.  Patient affirms current coverage meets needs.  Compliance/Adherence/Medication fill history: Care Gaps: COVID booster  Star-Rating Drugs: Rosuvastatin 34m - [facility] [address] [address]Phone: [phone]: [phone] [place] [place] [address]Phone: [phone]: [phone] [facility] [place] [facility] [address] [place]Phone: [phone]: [phone] Uses pill box? No - all pills are in a plastic container - card with each one Pt endorses 99% compliance  We discussed: Current pharmacy is preferred with insurance plan and patient is satisfied with pharmacy services Patient decided to: Continue current medication management strategy  Care Plan and Follow Up Patient Decision:  Patient agrees to Care Plan and Follow-up.  Plan: The care management team will reach out to the patient again over the next 30 days.  MJeni Salles PharmD, [facility] [phone]

## 2021-05-09 DIAGNOSIS — H353122 Nonexudative age-related macular degeneration, left eye, intermediate dry stage: Secondary | ICD-10-CM | POA: Diagnosis not present

## 2021-05-22 ENCOUNTER — Telehealth: Payer: Self-pay | Admitting: Pharmacist

## 2021-05-22 NOTE — Chronic Care Management (AMB) (Signed)
    Chronic Care Management Pharmacy Assistant   Name: Samantha Foley  MRN: [id number] DOB: [birth year]   Reason for Encounter: Sleep Assessment   Conditions to be addressed/monitored: Trazodone tolerance per Jeni Salles   Recent office visits:  None  Recent consult visits:  None  Hospital visits:  None in previous 6 months  Medications: Outpatient Encounter Medications as of 05/22/2021  Medication Sig   apixaban (ELIQUIS) 5 MG TABS tablet Take 1 tablet (5 mg total) by mouth 2 (two) times daily.   Apoaequorin (PREVAGEN) 10 MG CAPS Take 1 capsule by mouth daily.   flecainide (TAMBOCOR) 50 MG tablet Take 1 tablet (50 mg total) by mouth 2 (two) times daily.   fluticasone (FLONASE) 50 MCG/ACT nasal spray Place 1 spray into both nostrils daily as needed for allergies or rhinitis.   HYDROcodone-acetaminophen (NORCO/VICODIN) 5-325 MG tablet Take 1 tablet by mouth every 6 (six) hours as needed for moderate pain.   metoprolol tartrate (LOPRESSOR) 25 MG tablet Take 1 tablet (25 mg total) by mouth 2 (two) times daily.   Multiple Vitamin (MULTIVITAMIN WITH MINERALS) TABS tablet Take 1 tablet by mouth daily.   Multiple Vitamins-Minerals (ICAPS AREDS 2 PO) Take 1 capsule by mouth 2 (two) times a day.   omeprazole (PRILOSEC) 20 MG capsule Take 1 capsule (20 mg total) by mouth daily.   Probiotic Product (PROBIOTIC DAILY PO) Take 1 capsule by mouth daily.   psyllium (METAMUCIL) 58.6 % powder Take 1 packet by mouth daily.   rosuvastatin (CRESTOR) 10 MG tablet TAKE 1 TABLET DAILY   vitamin B-12 (CYANOCOBALAMIN) 1000 MCG tablet Take 1 tablet by mouth daily.   No facility-administered encounter medications on file as of 05/22/2021.  Notes: Call to patient she reports she tried the Trazodone half a pill for one night and did not notice a difference so she did not do it again afterwards and has not used it since. She reports she has been getting to sleep just fine without using it and is doing  well. She also confirmed receipt of her Eliquis Patient Assistance Paperwork. She reports she has been visiting with her daughter for the holidays and has been dealing with a plumbing issue since Thanksgiving evening, so has not filled out as of yet. She reports no other concerns at this time.  Care Gaps: COVID Booster - Overdue AWV - 7/23 CCM - Declined at this time  Star Rating Drugs: Rosuvastatin (Crestor) 10 mg - Last filled 03/07/21 90 DS at [place]   Patient Assistance: Eliquis - Patient confirmed receipt has not filled out yet  [place] Clinical Pharmacist Assistant [phone]

## 2021-05-24 DIAGNOSIS — M810 Age-related osteoporosis without current pathological fracture: Secondary | ICD-10-CM

## 2021-05-24 DIAGNOSIS — I480 Paroxysmal atrial fibrillation: Secondary | ICD-10-CM | POA: Diagnosis not present

## 2021-06-06 ENCOUNTER — Other Ambulatory Visit: Payer: Self-pay | Admitting: Internal Medicine

## 2021-06-06 DIAGNOSIS — I480 Paroxysmal atrial fibrillation: Secondary | ICD-10-CM

## 2021-06-06 NOTE — Telephone Encounter (Signed)
Eliquis 5mg  refill request received. Patient is 87 years old, weight-73.3kg, Crea-0.82 on 01/12/2021, Diagnosis-Afib, and last seen by Dr. Caryl Comes on 09/13/2020. Dose is appropriate based on dosing criteria. Will send in refill to requested pharmacy.

## 2021-06-07 ENCOUNTER — Encounter: Payer: Self-pay | Admitting: Family Medicine

## 2021-06-07 ENCOUNTER — Other Ambulatory Visit (INDEPENDENT_AMBULATORY_CARE_PROVIDER_SITE_OTHER): Payer: Medicare HMO

## 2021-06-07 ENCOUNTER — Telehealth (INDEPENDENT_AMBULATORY_CARE_PROVIDER_SITE_OTHER): Payer: Medicare HMO | Admitting: Family Medicine

## 2021-06-07 ENCOUNTER — Other Ambulatory Visit: Payer: Self-pay

## 2021-06-07 VITALS — Temp 97.5°F | Ht 61.0 in | Wt 155.0 lb

## 2021-06-07 DIAGNOSIS — R5383 Other fatigue: Secondary | ICD-10-CM | POA: Diagnosis not present

## 2021-06-07 DIAGNOSIS — R52 Pain, unspecified: Secondary | ICD-10-CM

## 2021-06-07 DIAGNOSIS — M791 Myalgia, unspecified site: Secondary | ICD-10-CM | POA: Diagnosis not present

## 2021-06-07 DIAGNOSIS — R509 Fever, unspecified: Secondary | ICD-10-CM

## 2021-06-07 LAB — POCT INFLUENZA A/B
Influenza A, POC: NEGATIVE
Influenza B, POC: NEGATIVE

## 2021-06-07 LAB — POC COVID19 BINAXNOW: SARS Coronavirus 2 Ag: NEGATIVE

## 2021-06-07 NOTE — Patient Instructions (Signed)
Get plenty of rest (listen to your body--if you're tired, it is okay to rest). Drink plenty of fluids.  Use tylenol during the day (rather than just at bedtime), if  needed for any fever or body aches.  Your flu and COVID tests were negative.  The PCR test should be back tomorrow. If it is negative, but you develop more respiratory symptoms (congestion, drainage, cough), then we should consider starting Tamiflu tomorrow to treat for possible flu (sometimes we have false-negative tests).  Without any respiratory symptoms, I probably would hold off on medications, and just let whatever virus this is run its course.  If you have persistent weakness, if you have any dizziness, persistent fevers, then I'll want you in the office for re-evaluation (so I can examine you in person, and possibly run some tests).  I hope you feel better soon!

## 2021-06-07 NOTE — Progress Notes (Signed)
Start time: 11:55 End time: 12:09  Virtual Visit via Telephone Note  I connected with Samantha Foley on 06/07/21 by telephone and verified that I am speaking with the correct person using two identifiers. Patient reported inability to do video visit.  Location: Patient: home Provider: office   I discussed the limitations of evaluation and management by telemedicine and the availability of in person appointments. The patient expressed understanding and agreed to proceed.  History of Present Illness:  Chief Complaint  Patient presents with   Generalized Body Aches    VIRTUAL body aches and fever, low grade never got above 99. She feels like she has the flu. Started yesterday afternoon-she slept all afternoon. She would wake up and then fall right back to sleep. No respiratory symptoms. No home covid tests.    Yesterday she found herself falling asleep anytime she sat down, waking up hours later.  (Only woke up because the chair she was in was uncomfortable, then would fall back asleep).  Every bone in her body ached. She felt feverish, chills, highest temp was 99. When she woke up this morning, she was again very achey, although this has improved a little as the day progressed.  She has a slight discomfort in her neck.  No headache, congestion, sore throat, cough. No n/v/d She just feels achey and sleepy.  Takes tylenol in the evenings. Hasn't taken during the ady.  No sick contacts. Last saw Maudie Mercury (who works in a school) on 12/11, wasn't sick when she spoke to her today  PMH, [place], [place] reviewed  Outpatient Encounter Medications as of 06/07/2021  Medication Sig Note   acetaminophen (TYLENOL) 500 MG tablet Take 1,000 mg by mouth every 6 (six) hours as needed. 06/07/2021: Took last night   Apoaequorin (PREVAGEN) 10 MG CAPS Take 1 capsule by mouth daily.    diphenhydrAMINE (BENADRYL) 25 MG tablet Take 25 mg by mouth every 6 (six) hours as needed. 06/07/2021: Took last night    flecainide (TAMBOCOR) 50 MG tablet Take 1 tablet (50 mg total) by mouth 2 (two) times daily.    fluticasone (FLONASE) 50 MCG/ACT nasal spray Place 1 spray into both nostrils daily as needed for allergies or rhinitis.    metoprolol tartrate (LOPRESSOR) 25 MG tablet Take 1 tablet (25 mg total) by mouth 2 (two) times daily.    Multiple Vitamin (MULTIVITAMIN WITH MINERALS) TABS tablet Take 1 tablet by mouth daily.    Multiple Vitamins-Minerals (ICAPS AREDS 2 PO) Take 1 capsule by mouth 2 (two) times a day.    omeprazole (PRILOSEC) 20 MG capsule Take 1 capsule (20 mg total) by mouth daily.    Probiotic Product (PROBIOTIC DAILY PO) Take 1 capsule by mouth daily.    psyllium (METAMUCIL) 58.6 % powder Take 1 packet by mouth daily.    rosuvastatin (CRESTOR) 10 MG tablet TAKE 1 TABLET DAILY    vitamin B-12 (CYANOCOBALAMIN) 1000 MCG tablet Take 1 tablet by mouth daily.    ELIQUIS 5 MG TABS tablet TAKE 1 TABLET TWICE A DAY    HYDROcodone-acetaminophen (NORCO/VICODIN) 5-325 MG tablet Take 1 tablet by mouth every 6 (six) hours as needed for moderate pain. (Patient not taking: Reported on 06/07/2021) 06/07/2021: Only takes a needed   No facility-administered encounter medications on file as of 06/07/2021.    Allergies  Allergen Reactions   Ciprofloxacin Shortness Of Breath and Rash   Scallops [Shellfish Allergy] Anaphylaxis   Demerol Other (See Comments)    Patient states it  made her crazy and sleep for 12 hours   Doxycycline Nausea And Vomiting   Milk-Related Compounds Diarrhea   Penicillins Other (See Comments)    Did it involve swelling of the face/tongue/throat, SOB, or low BP? Unknown Did it involve sudden or severe rash/hives, skin peeling, or any reaction on the inside of your mouth or nose? Unknown Did you need to seek medical attention at a hospital or doctor's office? Unknown When did it last happen?       If all above answers are NO, may proceed with cephalosporin use.    Rivaroxaban  Other (See Comments)    Pt unsure but d/c use   Sudafed [Pseudoephedrine Hcl] Other (See Comments)    hyperactive   ROS:  See HPI.  +fever, myalgias, fatigue.  No URI symptoms, cough, chest pain, shortness of breath, n/v/d or urinary complaints. No bleeding, rashes. Moods are good.    Observations/Objective:  Temp (!) 97.5 F (36.4 C) (Temporal)    Ht 5\' 1"  (1.549 m)    Wt 155 lb (70.3 kg)    BMI 29.29 kg/m   Pleasant female. She is alert and oriented and speaking easily, in no distress.  Exam is limited due to virtual nature of the visit.  Negative influenza A&B tests and negative rapid COVID test.   Assessment and Plan:  Myalgia  Fatigue, unspecified type  Fever, unspecified fever cause  Ddx reviewed. PCR will be sent. If worsening flu-like symptoms, can start Tamiflu tomorrow, to treat presumptive flu (with false neg test). At this point, she was afebrile without any meds on board, with no respiratory symptoms, so not classic for flu. The sudden onset with myalgias and fever are though.  Will touch base with patient tomorrow (with PCR results), and see how she is feeling. She was encouraged to cont tylenol throughout the day (as needed for pain/myalgia or fever), to drink plenty of fluids and rest.  Tamiflu would need to be started by tomorrow.   Follow Up Instructions:    I discussed the assessment and treatment plan with the patient. The patient was provided an opportunity to ask questions and all were answered. The patient agreed with the plan and demonstrated an understanding of the instructions.   The patient was advised to call back or seek an in-person evaluation if the symptoms worsen or if the condition fails to improve as anticipated.  I spent 20 minutes dedicated to the care of this patient, including pre-visit review of records, face to face time, post-visit ordering of testing and documentation.    Vikki Ports, MD

## 2021-06-08 DIAGNOSIS — H353122 Nonexudative age-related macular degeneration, left eye, intermediate dry stage: Secondary | ICD-10-CM | POA: Diagnosis not present

## 2021-06-08 LAB — SARS-COV-2, NAA 2 DAY TAT

## 2021-06-08 LAB — NOVEL CORONAVIRUS, NAA: SARS-CoV-2, NAA: NOT DETECTED

## 2021-06-09 ENCOUNTER — Telehealth: Payer: Self-pay | Admitting: Family Medicine

## 2021-06-09 ENCOUNTER — Ambulatory Visit: Payer: Medicare HMO | Admitting: Family Medicine

## 2021-06-09 NOTE — Telephone Encounter (Signed)
Pt called and states she just feels awful.Symptoms are the same as virtual with Dr. Tomi Bamberger.  She made an appt with [facility] for today and then called back and cancelled. She states she does not have a ride. Pt declined to make an appt with Dr. Tomi Bamberger for Monday. Sending message back to Dr. Tomi Bamberger to advise pt's status.

## 2021-06-09 NOTE — Telephone Encounter (Signed)
We had called her yesterday to let her know that her COVID PCR test was negative.  Cyril Mourning said that she was feeling a little better, no worse (I can't seem to find that documentation in the chart though).  I'd like to know what is making her feel "awful". At her visit, she was achey and fatigued, low grade fever, no other symptoms (no headache, respiratory symptoms, GI problems).

## 2021-06-09 NOTE — Telephone Encounter (Signed)
Called pt back and asked specifically what issues she was having. She states she is having neck pain and headaches but they come and go. She is is very tired, shaky and weak. She has no appetite. Pt has no fever. She states that she has worsening allergies and is having to breathe out of her mouth. No GI problems. Pt did state that her daughter will be calling at some point today to make a appt with Dr. Tomi Bamberger for Monday.

## 2021-06-15 ENCOUNTER — Telehealth: Payer: Self-pay | Admitting: Internal Medicine

## 2021-06-15 NOTE — Telephone Encounter (Signed)
°*  STAT* If patient is at the pharmacy, call can be transferred to refill team.   1. Which medications need to be refilled? (please list name of each medication and dose if known) ELIQUIS 5 MG TABS tablet  2. Which pharmacy/location (including street and city if local pharmacy) is medication to be sent to? [facility]  [address], [place] [address]  3. Do they need a 30 day or 90 day supply? 30 DAY REFILL  PT WANTS TO KNOW IF THIS REFILL REQUEST CAN BE SENT 06/25/21

## 2021-06-16 ENCOUNTER — Other Ambulatory Visit: Payer: Self-pay

## 2021-06-16 DIAGNOSIS — I480 Paroxysmal atrial fibrillation: Secondary | ICD-10-CM

## 2021-06-16 MED ORDER — APIXABAN 5 MG PO TABS: 5.0000 mg | ORAL_TABLET | Freq: Two times a day (BID) | ORAL | 1 refills | Status: DC

## 2021-06-16 NOTE — Telephone Encounter (Signed)
Prescription refill request for Eliquis received. Indication: Afib  Last office visit:09/13/20 Caryl Comes)  Scr: 0.82 (01/12/21) Age: 87 Weight: 70.3kg  Appropriate dose and refill sent to requested pharmacy.

## 2021-06-16 NOTE — Telephone Encounter (Signed)
Called pt and made her aware that we are unable to send the refill of 06/25/2021 or 06/26/2021; she states she will be fine with it being sent on 06/27/2021 and that [facility] was charging her a lot. Asked if she was still using [facility] since it was last sent there. She stated [facility] was the place so never got an answer about [facility]. She stated she wants to use [facility] and that did not want her insurance charged at this time. She will call them and see how the charges will go.Advised I would call her back at 4pm regarding this versus letting us know on 06/27/2021 when she wants it filled. She was thankful and will await.

## 2021-06-16 NOTE — Telephone Encounter (Signed)
Returned call to the pt and she states to send since she spoke with Pharmacist Gerald Stabs at [facility], who stated the Rx could be placed on hold by them until 06/27/2021. We sent in the Eliquis prescription and called to follow up. Spoke with Gerald Stabs at [facility] and he stated he received and will place on hold until then.

## 2021-06-23 DIAGNOSIS — H353122 Nonexudative age-related macular degeneration, left eye, intermediate dry stage: Secondary | ICD-10-CM | POA: Diagnosis not present

## 2021-06-23 DIAGNOSIS — Z961 Presence of intraocular lens: Secondary | ICD-10-CM | POA: Diagnosis not present

## 2021-06-23 DIAGNOSIS — D3122 Benign neoplasm of left retina: Secondary | ICD-10-CM | POA: Diagnosis not present

## 2021-06-23 DIAGNOSIS — H353211 Exudative age-related macular degeneration, right eye, with active choroidal neovascularization: Secondary | ICD-10-CM | POA: Diagnosis not present

## 2021-06-28 ENCOUNTER — Other Ambulatory Visit: Payer: Self-pay | Admitting: Family Medicine

## 2021-06-28 DIAGNOSIS — Z1231 Encounter for screening mammogram for malignant neoplasm of breast: Secondary | ICD-10-CM

## 2021-06-29 ENCOUNTER — Other Ambulatory Visit: Payer: Self-pay

## 2021-06-29 ENCOUNTER — Encounter (HOSPITAL_COMMUNITY): Payer: Self-pay | Admitting: Emergency Medicine

## 2021-06-29 ENCOUNTER — Emergency Department (HOSPITAL_COMMUNITY): Payer: Medicare HMO

## 2021-06-29 ENCOUNTER — Emergency Department (HOSPITAL_COMMUNITY)
Admission: EM | Admit: 2021-06-29 | Discharge: 2021-06-29 | Disposition: A | Discharge status: Home / Self Care | Payer: Medicare HMO | Attending: Emergency Medicine | Admitting: Emergency Medicine

## 2021-06-29 DIAGNOSIS — Y92009 Unspecified place in unspecified non-institutional (private) residence as the place of occurrence of the external cause: Secondary | ICD-10-CM | POA: Insufficient documentation

## 2021-06-29 DIAGNOSIS — R000 Tachycardia, unspecified: Secondary | ICD-10-CM | POA: Diagnosis not present

## 2021-06-29 DIAGNOSIS — M7989 Other specified soft tissue disorders: Secondary | ICD-10-CM | POA: Diagnosis not present

## 2021-06-29 DIAGNOSIS — S82892A Other fracture of left lower leg, initial encounter for closed fracture: Secondary | ICD-10-CM | POA: Diagnosis not present

## 2021-06-29 DIAGNOSIS — X509XXA Other and unspecified overexertion or strenuous movements or postures, initial encounter: Secondary | ICD-10-CM | POA: Diagnosis not present

## 2021-06-29 DIAGNOSIS — S8992XA Unspecified injury of left lower leg, initial encounter: Secondary | ICD-10-CM | POA: Diagnosis present

## 2021-06-29 DIAGNOSIS — Z7951 Long term (current) use of inhaled steroids: Secondary | ICD-10-CM | POA: Diagnosis not present

## 2021-06-29 DIAGNOSIS — S82832A Other fracture of upper and lower end of left fibula, initial encounter for closed fracture: Secondary | ICD-10-CM | POA: Diagnosis not present

## 2021-06-29 DIAGNOSIS — W19XXXA Unspecified fall, initial encounter: Secondary | ICD-10-CM | POA: Insufficient documentation

## 2021-06-29 DIAGNOSIS — S8012XA Contusion of left lower leg, initial encounter: Secondary | ICD-10-CM | POA: Diagnosis not present

## 2021-06-29 DIAGNOSIS — M25572 Pain in left ankle and joints of left foot: Secondary | ICD-10-CM | POA: Diagnosis not present

## 2021-06-29 MED ORDER — OXYCODONE-ACETAMINOPHEN 5-325 MG PO TABS: 1.0000 | ORAL_TABLET | Freq: Three times a day (TID) | ORAL | 0 refills | Status: DC | PRN

## 2021-06-29 MED ORDER — OXYCODONE-ACETAMINOPHEN 5-325 MG PO TABS
1.0000 | ORAL_TABLET | Freq: Once | ORAL | Status: AC
  Administered 2021-06-29: 1 via ORAL
  Filled 2021-06-29: qty 1

## 2021-06-29 NOTE — ED Triage Notes (Signed)
The patient presents from home. Today the patient rolled her left ankle and fell on her side. When EMS arrived, they found the patient in a sitting position. She did not hit her head. EMS noted increased ankle swelling and decreased mobility. EMS also administered 200 mcg of fentanyl.   Hx: AFib  EMS vitals: 160/72 95 % SPO2 on room air 80-120 HR

## 2021-06-29 NOTE — ED Provider Notes (Signed)
[facility] DEPT Provider Note   CSN: [id number] Arrival date & time: 06/29/21  2013     History  Chief Complaint  Patient presents with   Fall   Ankle Pain    Samantha Foley is a 88 y.o. female.  88 year old female with prior medical history as detailed below presents for evaluation.  Patient reports that she was at home.  Her foot caught on a rug and then she rolled her left ankle.  She complains of pain to the left ankle.  Patient did not fall.  Patient did not hit her head.  She denies loss of consciousness.  She denies neck pain.  She reports that her pain is significantly improved after EMS administered 200 mcg of fentanyl en route.  At baseline, she ambulates independently.  She occasionally will use a walker.  She is known to [facility].  The history is provided by the patient.  Fall This is a new problem. The current episode started 1 to 2 hours ago. The problem occurs constantly. The problem has not changed since onset.Pertinent negatives include no chest pain and no abdominal pain. Nothing aggravates the symptoms. Nothing relieves the symptoms.      Home Medications Prior to Admission medications   Medication Sig Start Date End Date Taking? Authorizing Provider  acetaminophen (TYLENOL) 500 MG tablet Take 1,000 mg by mouth every 6 (six) hours as needed.    [provider]  apixaban (ELIQUIS) 5 MG TABS tablet Take 1 tablet (5 mg total) by mouth 2 (two) times daily. 06/16/21   Deboraha Sprang, MD  Apoaequorin (PREVAGEN) 10 MG CAPS Take 1 capsule by mouth daily.    [provider]  diphenhydrAMINE (BENADRYL) 25 MG tablet Take 25 mg by mouth every 6 (six) hours as needed.    [provider]  flecainide (TAMBOCOR) 50 MG tablet Take 1 tablet (50 mg total) by mouth 2 (two) times daily. 07/01/20   Sherran Needs, NP  fluticasone (FLONASE) 50 MCG/ACT nasal spray Place 1 spray into both nostrils daily as needed for  allergies or rhinitis.    [provider]  HYDROcodone-acetaminophen (NORCO/VICODIN) 5-325 MG tablet Take 1 tablet by mouth every 6 (six) hours as needed for moderate pain. Patient not taking: Reported on 06/07/2021    [provider]  metoprolol tartrate (LOPRESSOR) 25 MG tablet Take 1 tablet (25 mg total) by mouth 2 (two) times daily. 09/01/20   Deboraha Sprang, MD  Multiple Vitamin (MULTIVITAMIN WITH MINERALS) TABS tablet Take 1 tablet by mouth daily.    [provider]  Multiple Vitamins-Minerals (ICAPS AREDS 2 PO) Take 1 capsule by mouth 2 (two) times a day.    [provider]  omeprazole (PRILOSEC) 20 MG capsule Take 1 capsule (20 mg total) by mouth daily. 01/12/21   Rita Ohara, MD  Probiotic Product (PROBIOTIC DAILY PO) Take 1 capsule by mouth daily.    [provider]  psyllium (METAMUCIL) 58.6 % powder Take 1 packet by mouth daily.    [provider]  rosuvastatin (CRESTOR) 10 MG tablet TAKE 1 TABLET DAILY 03/02/21   Rita Ohara, MD  vitamin B-12 (CYANOCOBALAMIN) 1000 MCG tablet Take 1 tablet by mouth daily.    [provider]      Allergies    Ciprofloxacin, Scallops [shellfish allergy], Demerol, Doxycycline, Milk-related compounds, Penicillins, Rivaroxaban, and Sudafed [pseudoephedrine hcl]    Review of Systems   Review of Systems  Cardiovascular:  Negative  for chest pain.  Gastrointestinal:  Negative for abdominal pain.  All other systems reviewed and are negative.  Physical Exam Updated Vital Signs BP (!) 164/59 (BP Location: Left Arm)    Pulse 68    Temp 98.4 F (36.9 C) (Oral)    Resp 16    Ht 5' (1.524 m)    Wt 65.8 kg    SpO2 94%    BMI 28.32 kg/m  Physical Exam Vitals and nursing note reviewed.  Constitutional:      General: She is not in acute distress.    Appearance: Normal appearance. She is well-developed.  HENT:     Head: Normocephalic and atraumatic.  Eyes:     Conjunctiva/sclera: Conjunctivae normal.      Pupils: Pupils are equal, round, and reactive to light.  Cardiovascular:     Rate and Rhythm: Normal rate and regular rhythm.     Heart sounds: Normal heart sounds.  Pulmonary:     Effort: Pulmonary effort is normal. No respiratory distress.     Breath sounds: Normal breath sounds.  Abdominal:     General: There is no distension.     Palpations: Abdomen is soft.     Tenderness: There is no abdominal tenderness.  Musculoskeletal:        General: Tenderness present. No deformity. Normal range of motion.     Cervical back: Normal range of motion and neck supple.     Comments: Tenderness with palpation along the medial aspect of the left ankle overlying the malleolus  Left knee without appreciable tenderness.  Patient's left knee is with full active range of motion.  Distal left lower extremity is neurovascular intact.  Skin:    General: Skin is warm and dry.  Neurological:     General: No focal deficit present.     Mental Status: She is alert and oriented to person, place, and time. Mental status is at baseline.    ED Results / Procedures / Treatments   Labs (all labs ordered are listed, but only abnormal results are displayed) Labs Reviewed - No data to display  EKG None  Radiology No results found.  Procedures Procedures    Medications Ordered in ED Medications - No data to display  ED Course/ Medical Decision Making/ A&P                           Medical Decision Making Patient presents with complaint of left ankle pain after rolling it at home.  History and exam are concerning for possible fracture.  Plain film imaging obtained.   Imaging reviewed by myself. Distal left fibula Fx present. Agree with radiology interpretation.   Patient is known to [facility] ([place]).    She and her daughter will follow up with them tomorrow morning.    Cam boot placed.    Patient and daughter understands need for close follow-up.    Strict return precautions  given and understood.  Problems Addressed: Closed fracture of left ankle, initial encounter: acute illness or injury  Amount and/or Complexity of Data Reviewed Independent Historian: caregiver    Details: Patient's daughter at bedside. She corraborates patient's history External Data Reviewed: notes.    Details: Prior ED evaluations. Radiology: ordered and independent interpretation performed.    Details: Distal left fibula fx present. Agree with Radiology read.  Risk Decision regarding hospitalization.           Final Clinical Impression(s) / ED Diagnoses  Final diagnoses:  None    Rx / DC Orders ED Discharge Orders     None         Valarie Merino, MD 06/29/21 2200

## 2021-06-29 NOTE — Discharge Instructions (Signed)
Return for any problem.  Follow up with your orthopedic provider at [facility] tomorrow.

## 2021-06-30 ENCOUNTER — Telehealth: Payer: Self-pay

## 2021-06-30 DIAGNOSIS — M25572 Pain in left ankle and joints of left foot: Secondary | ICD-10-CM | POA: Diagnosis not present

## 2021-06-30 DIAGNOSIS — S8265XA Nondisplaced fracture of lateral malleolus of left fibula, initial encounter for closed fracture: Secondary | ICD-10-CM | POA: Diagnosis not present

## 2021-06-30 NOTE — Telephone Encounter (Signed)
I called the pt. Per my pt. Ping report. She was recently in the ED for a fractured ankle. She said she is doing ok and will f/u with ortho for this issue. She did say thank you for calling and checking on her.

## 2021-07-06 DIAGNOSIS — S8265XA Nondisplaced fracture of lateral malleolus of left fibula, initial encounter for closed fracture: Secondary | ICD-10-CM | POA: Diagnosis not present

## 2021-07-18 ENCOUNTER — Ambulatory Visit: Payer: Medicare HMO

## 2021-07-19 ENCOUNTER — Other Ambulatory Visit (HOSPITAL_COMMUNITY): Payer: Self-pay | Admitting: Nurse Practitioner

## 2021-07-22 DIAGNOSIS — K219 Gastro-esophageal reflux disease without esophagitis: Secondary | ICD-10-CM | POA: Diagnosis not present

## 2021-07-22 DIAGNOSIS — M24111 Other articular cartilage disorders, right shoulder: Secondary | ICD-10-CM | POA: Diagnosis not present

## 2021-07-22 DIAGNOSIS — M170 Bilateral primary osteoarthritis of knee: Secondary | ICD-10-CM | POA: Diagnosis not present

## 2021-07-22 DIAGNOSIS — M545 Low back pain, unspecified: Secondary | ICD-10-CM | POA: Diagnosis not present

## 2021-07-22 DIAGNOSIS — Z7901 Long term (current) use of anticoagulants: Secondary | ICD-10-CM | POA: Diagnosis not present

## 2021-07-22 DIAGNOSIS — H3530 Unspecified macular degeneration: Secondary | ICD-10-CM | POA: Diagnosis not present

## 2021-07-22 DIAGNOSIS — M80062D Age-related osteoporosis with current pathological fracture, left lower leg, subsequent encounter for fracture with routine healing: Secondary | ICD-10-CM | POA: Diagnosis not present

## 2021-07-22 DIAGNOSIS — K5900 Constipation, unspecified: Secondary | ICD-10-CM | POA: Diagnosis not present

## 2021-07-22 DIAGNOSIS — R32 Unspecified urinary incontinence: Secondary | ICD-10-CM | POA: Diagnosis not present

## 2021-07-22 DIAGNOSIS — Z9181 History of falling: Secondary | ICD-10-CM | POA: Diagnosis not present

## 2021-07-22 DIAGNOSIS — S8265XA Nondisplaced fracture of lateral malleolus of left fibula, initial encounter for closed fracture: Secondary | ICD-10-CM | POA: Diagnosis not present

## 2021-07-26 DIAGNOSIS — M545 Low back pain, unspecified: Secondary | ICD-10-CM | POA: Diagnosis not present

## 2021-07-26 DIAGNOSIS — M170 Bilateral primary osteoarthritis of knee: Secondary | ICD-10-CM | POA: Diagnosis not present

## 2021-07-26 DIAGNOSIS — K5900 Constipation, unspecified: Secondary | ICD-10-CM | POA: Diagnosis not present

## 2021-07-26 DIAGNOSIS — H3530 Unspecified macular degeneration: Secondary | ICD-10-CM | POA: Diagnosis not present

## 2021-07-26 DIAGNOSIS — R32 Unspecified urinary incontinence: Secondary | ICD-10-CM | POA: Diagnosis not present

## 2021-07-26 DIAGNOSIS — M24111 Other articular cartilage disorders, right shoulder: Secondary | ICD-10-CM | POA: Diagnosis not present

## 2021-07-26 DIAGNOSIS — Z7901 Long term (current) use of anticoagulants: Secondary | ICD-10-CM | POA: Diagnosis not present

## 2021-07-26 DIAGNOSIS — Z9181 History of falling: Secondary | ICD-10-CM | POA: Diagnosis not present

## 2021-07-26 DIAGNOSIS — K219 Gastro-esophageal reflux disease without esophagitis: Secondary | ICD-10-CM | POA: Diagnosis not present

## 2021-07-26 DIAGNOSIS — M80062D Age-related osteoporosis with current pathological fracture, left lower leg, subsequent encounter for fracture with routine healing: Secondary | ICD-10-CM | POA: Diagnosis not present

## 2021-07-28 DIAGNOSIS — M170 Bilateral primary osteoarthritis of knee: Secondary | ICD-10-CM | POA: Diagnosis not present

## 2021-07-28 DIAGNOSIS — K5900 Constipation, unspecified: Secondary | ICD-10-CM | POA: Diagnosis not present

## 2021-07-28 DIAGNOSIS — H3530 Unspecified macular degeneration: Secondary | ICD-10-CM | POA: Diagnosis not present

## 2021-07-28 DIAGNOSIS — R32 Unspecified urinary incontinence: Secondary | ICD-10-CM | POA: Diagnosis not present

## 2021-07-28 DIAGNOSIS — K219 Gastro-esophageal reflux disease without esophagitis: Secondary | ICD-10-CM | POA: Diagnosis not present

## 2021-07-28 DIAGNOSIS — M80062D Age-related osteoporosis with current pathological fracture, left lower leg, subsequent encounter for fracture with routine healing: Secondary | ICD-10-CM | POA: Diagnosis not present

## 2021-07-28 DIAGNOSIS — M545 Low back pain, unspecified: Secondary | ICD-10-CM | POA: Diagnosis not present

## 2021-07-28 DIAGNOSIS — Z7901 Long term (current) use of anticoagulants: Secondary | ICD-10-CM | POA: Diagnosis not present

## 2021-07-28 DIAGNOSIS — Z9181 History of falling: Secondary | ICD-10-CM | POA: Diagnosis not present

## 2021-07-28 DIAGNOSIS — M24111 Other articular cartilage disorders, right shoulder: Secondary | ICD-10-CM | POA: Diagnosis not present

## 2021-07-31 DIAGNOSIS — Z7901 Long term (current) use of anticoagulants: Secondary | ICD-10-CM | POA: Diagnosis not present

## 2021-07-31 DIAGNOSIS — M80062D Age-related osteoporosis with current pathological fracture, left lower leg, subsequent encounter for fracture with routine healing: Secondary | ICD-10-CM | POA: Diagnosis not present

## 2021-07-31 DIAGNOSIS — M545 Low back pain, unspecified: Secondary | ICD-10-CM | POA: Diagnosis not present

## 2021-07-31 DIAGNOSIS — R32 Unspecified urinary incontinence: Secondary | ICD-10-CM | POA: Diagnosis not present

## 2021-07-31 DIAGNOSIS — M170 Bilateral primary osteoarthritis of knee: Secondary | ICD-10-CM | POA: Diagnosis not present

## 2021-07-31 DIAGNOSIS — K219 Gastro-esophageal reflux disease without esophagitis: Secondary | ICD-10-CM | POA: Diagnosis not present

## 2021-07-31 DIAGNOSIS — H3530 Unspecified macular degeneration: Secondary | ICD-10-CM | POA: Diagnosis not present

## 2021-07-31 DIAGNOSIS — K5900 Constipation, unspecified: Secondary | ICD-10-CM | POA: Diagnosis not present

## 2021-07-31 DIAGNOSIS — Z9181 History of falling: Secondary | ICD-10-CM | POA: Diagnosis not present

## 2021-07-31 DIAGNOSIS — M24111 Other articular cartilage disorders, right shoulder: Secondary | ICD-10-CM | POA: Diagnosis not present

## 2021-08-01 DIAGNOSIS — M1712 Unilateral primary osteoarthritis, left knee: Secondary | ICD-10-CM | POA: Diagnosis not present

## 2021-08-01 DIAGNOSIS — M25562 Pain in left knee: Secondary | ICD-10-CM | POA: Diagnosis not present

## 2021-08-01 DIAGNOSIS — S8265XA Nondisplaced fracture of lateral malleolus of left fibula, initial encounter for closed fracture: Secondary | ICD-10-CM | POA: Diagnosis not present

## 2021-08-01 DIAGNOSIS — M25572 Pain in left ankle and joints of left foot: Secondary | ICD-10-CM | POA: Diagnosis not present

## 2021-08-02 ENCOUNTER — Telehealth: Payer: Self-pay | Admitting: Family Medicine

## 2021-08-02 DIAGNOSIS — H3530 Unspecified macular degeneration: Secondary | ICD-10-CM | POA: Diagnosis not present

## 2021-08-02 DIAGNOSIS — M545 Low back pain, unspecified: Secondary | ICD-10-CM | POA: Diagnosis not present

## 2021-08-02 DIAGNOSIS — Z7901 Long term (current) use of anticoagulants: Secondary | ICD-10-CM | POA: Diagnosis not present

## 2021-08-02 DIAGNOSIS — R32 Unspecified urinary incontinence: Secondary | ICD-10-CM | POA: Diagnosis not present

## 2021-08-02 DIAGNOSIS — K219 Gastro-esophageal reflux disease without esophagitis: Secondary | ICD-10-CM | POA: Diagnosis not present

## 2021-08-02 DIAGNOSIS — M80062D Age-related osteoporosis with current pathological fracture, left lower leg, subsequent encounter for fracture with routine healing: Secondary | ICD-10-CM | POA: Diagnosis not present

## 2021-08-02 DIAGNOSIS — M170 Bilateral primary osteoarthritis of knee: Secondary | ICD-10-CM | POA: Diagnosis not present

## 2021-08-02 DIAGNOSIS — M24111 Other articular cartilage disorders, right shoulder: Secondary | ICD-10-CM | POA: Diagnosis not present

## 2021-08-02 DIAGNOSIS — K5900 Constipation, unspecified: Secondary | ICD-10-CM | POA: Diagnosis not present

## 2021-08-02 DIAGNOSIS — Z9181 History of falling: Secondary | ICD-10-CM | POA: Diagnosis not present

## 2021-08-02 NOTE — Telephone Encounter (Signed)
Called and spoke to pt concerning her next Prolia. Pt states that her daughter Samantha Foley will call back to schedule. Pt advised appt has to be after 08/06/2021 and her estimated cost will be $302. Medication will need to be ordered.

## 2021-08-04 DIAGNOSIS — H3530 Unspecified macular degeneration: Secondary | ICD-10-CM | POA: Diagnosis not present

## 2021-08-04 DIAGNOSIS — M545 Low back pain, unspecified: Secondary | ICD-10-CM | POA: Diagnosis not present

## 2021-08-04 DIAGNOSIS — Z7901 Long term (current) use of anticoagulants: Secondary | ICD-10-CM | POA: Diagnosis not present

## 2021-08-04 DIAGNOSIS — M170 Bilateral primary osteoarthritis of knee: Secondary | ICD-10-CM | POA: Diagnosis not present

## 2021-08-04 DIAGNOSIS — R32 Unspecified urinary incontinence: Secondary | ICD-10-CM | POA: Diagnosis not present

## 2021-08-04 DIAGNOSIS — K219 Gastro-esophageal reflux disease without esophagitis: Secondary | ICD-10-CM | POA: Diagnosis not present

## 2021-08-04 DIAGNOSIS — K5900 Constipation, unspecified: Secondary | ICD-10-CM | POA: Diagnosis not present

## 2021-08-04 DIAGNOSIS — M24111 Other articular cartilage disorders, right shoulder: Secondary | ICD-10-CM | POA: Diagnosis not present

## 2021-08-04 DIAGNOSIS — Z9181 History of falling: Secondary | ICD-10-CM | POA: Diagnosis not present

## 2021-08-04 DIAGNOSIS — M80062D Age-related osteoporosis with current pathological fracture, left lower leg, subsequent encounter for fracture with routine healing: Secondary | ICD-10-CM | POA: Diagnosis not present

## 2021-08-09 ENCOUNTER — Telehealth: Payer: Self-pay | Admitting: Family Medicine

## 2021-08-09 DIAGNOSIS — K5900 Constipation, unspecified: Secondary | ICD-10-CM | POA: Diagnosis not present

## 2021-08-09 DIAGNOSIS — M24111 Other articular cartilage disorders, right shoulder: Secondary | ICD-10-CM | POA: Diagnosis not present

## 2021-08-09 DIAGNOSIS — R32 Unspecified urinary incontinence: Secondary | ICD-10-CM | POA: Diagnosis not present

## 2021-08-09 DIAGNOSIS — M80062D Age-related osteoporosis with current pathological fracture, left lower leg, subsequent encounter for fracture with routine healing: Secondary | ICD-10-CM | POA: Diagnosis not present

## 2021-08-09 DIAGNOSIS — K219 Gastro-esophageal reflux disease without esophagitis: Secondary | ICD-10-CM | POA: Diagnosis not present

## 2021-08-09 DIAGNOSIS — H3530 Unspecified macular degeneration: Secondary | ICD-10-CM | POA: Diagnosis not present

## 2021-08-09 DIAGNOSIS — Z9181 History of falling: Secondary | ICD-10-CM | POA: Diagnosis not present

## 2021-08-09 DIAGNOSIS — Z7901 Long term (current) use of anticoagulants: Secondary | ICD-10-CM | POA: Diagnosis not present

## 2021-08-09 DIAGNOSIS — M170 Bilateral primary osteoarthritis of knee: Secondary | ICD-10-CM | POA: Diagnosis not present

## 2021-08-09 DIAGNOSIS — M545 Low back pain, unspecified: Secondary | ICD-10-CM | POA: Diagnosis not present

## 2021-08-09 NOTE — Telephone Encounter (Signed)
Noted. Ok. 

## 2021-08-09 NOTE — Telephone Encounter (Signed)
After leaving several messages with pt to have daughter call me back concerning Prolia I called Kim directly today. Advised pt was over due for her next Prolia. She was due 08/07/2021. Kim informed pt was not able to come in due to a injury. Appt was made for pt to come in on 08/23/2021. Due to shot being late I wanted to advise Dr. Tomi Bamberger. I also told Maudie Mercury to call when then got here and a CMA would come out to her car to give injection due to pt's recent injury.

## 2021-08-10 DIAGNOSIS — M80062D Age-related osteoporosis with current pathological fracture, left lower leg, subsequent encounter for fracture with routine healing: Secondary | ICD-10-CM | POA: Diagnosis not present

## 2021-08-10 DIAGNOSIS — H3530 Unspecified macular degeneration: Secondary | ICD-10-CM | POA: Diagnosis not present

## 2021-08-10 DIAGNOSIS — Z9181 History of falling: Secondary | ICD-10-CM | POA: Diagnosis not present

## 2021-08-10 DIAGNOSIS — K219 Gastro-esophageal reflux disease without esophagitis: Secondary | ICD-10-CM | POA: Diagnosis not present

## 2021-08-10 DIAGNOSIS — R32 Unspecified urinary incontinence: Secondary | ICD-10-CM | POA: Diagnosis not present

## 2021-08-10 DIAGNOSIS — M24111 Other articular cartilage disorders, right shoulder: Secondary | ICD-10-CM | POA: Diagnosis not present

## 2021-08-10 DIAGNOSIS — K5900 Constipation, unspecified: Secondary | ICD-10-CM | POA: Diagnosis not present

## 2021-08-10 DIAGNOSIS — M170 Bilateral primary osteoarthritis of knee: Secondary | ICD-10-CM | POA: Diagnosis not present

## 2021-08-10 DIAGNOSIS — Z7901 Long term (current) use of anticoagulants: Secondary | ICD-10-CM | POA: Diagnosis not present

## 2021-08-10 DIAGNOSIS — M545 Low back pain, unspecified: Secondary | ICD-10-CM | POA: Diagnosis not present

## 2021-08-14 DIAGNOSIS — M80062D Age-related osteoporosis with current pathological fracture, left lower leg, subsequent encounter for fracture with routine healing: Secondary | ICD-10-CM | POA: Diagnosis not present

## 2021-08-14 DIAGNOSIS — K219 Gastro-esophageal reflux disease without esophagitis: Secondary | ICD-10-CM | POA: Diagnosis not present

## 2021-08-14 DIAGNOSIS — R32 Unspecified urinary incontinence: Secondary | ICD-10-CM | POA: Diagnosis not present

## 2021-08-14 DIAGNOSIS — Z9181 History of falling: Secondary | ICD-10-CM | POA: Diagnosis not present

## 2021-08-14 DIAGNOSIS — Z7901 Long term (current) use of anticoagulants: Secondary | ICD-10-CM | POA: Diagnosis not present

## 2021-08-14 DIAGNOSIS — M24111 Other articular cartilage disorders, right shoulder: Secondary | ICD-10-CM | POA: Diagnosis not present

## 2021-08-14 DIAGNOSIS — H3530 Unspecified macular degeneration: Secondary | ICD-10-CM | POA: Diagnosis not present

## 2021-08-14 DIAGNOSIS — M170 Bilateral primary osteoarthritis of knee: Secondary | ICD-10-CM | POA: Diagnosis not present

## 2021-08-14 DIAGNOSIS — K5900 Constipation, unspecified: Secondary | ICD-10-CM | POA: Diagnosis not present

## 2021-08-14 DIAGNOSIS — M545 Low back pain, unspecified: Secondary | ICD-10-CM | POA: Diagnosis not present

## 2021-08-17 DIAGNOSIS — H3530 Unspecified macular degeneration: Secondary | ICD-10-CM | POA: Diagnosis not present

## 2021-08-17 DIAGNOSIS — Z9181 History of falling: Secondary | ICD-10-CM | POA: Diagnosis not present

## 2021-08-17 DIAGNOSIS — M170 Bilateral primary osteoarthritis of knee: Secondary | ICD-10-CM | POA: Diagnosis not present

## 2021-08-17 DIAGNOSIS — K5900 Constipation, unspecified: Secondary | ICD-10-CM | POA: Diagnosis not present

## 2021-08-17 DIAGNOSIS — Z7901 Long term (current) use of anticoagulants: Secondary | ICD-10-CM | POA: Diagnosis not present

## 2021-08-17 DIAGNOSIS — M80062D Age-related osteoporosis with current pathological fracture, left lower leg, subsequent encounter for fracture with routine healing: Secondary | ICD-10-CM | POA: Diagnosis not present

## 2021-08-17 DIAGNOSIS — R32 Unspecified urinary incontinence: Secondary | ICD-10-CM | POA: Diagnosis not present

## 2021-08-17 DIAGNOSIS — K219 Gastro-esophageal reflux disease without esophagitis: Secondary | ICD-10-CM | POA: Diagnosis not present

## 2021-08-17 DIAGNOSIS — M24111 Other articular cartilage disorders, right shoulder: Secondary | ICD-10-CM | POA: Diagnosis not present

## 2021-08-17 DIAGNOSIS — M545 Low back pain, unspecified: Secondary | ICD-10-CM | POA: Diagnosis not present

## 2021-08-21 DIAGNOSIS — Z7901 Long term (current) use of anticoagulants: Secondary | ICD-10-CM | POA: Diagnosis not present

## 2021-08-21 DIAGNOSIS — M80062D Age-related osteoporosis with current pathological fracture, left lower leg, subsequent encounter for fracture with routine healing: Secondary | ICD-10-CM | POA: Diagnosis not present

## 2021-08-21 DIAGNOSIS — Z9181 History of falling: Secondary | ICD-10-CM | POA: Diagnosis not present

## 2021-08-21 DIAGNOSIS — M170 Bilateral primary osteoarthritis of knee: Secondary | ICD-10-CM | POA: Diagnosis not present

## 2021-08-21 DIAGNOSIS — K219 Gastro-esophageal reflux disease without esophagitis: Secondary | ICD-10-CM | POA: Diagnosis not present

## 2021-08-21 DIAGNOSIS — H3530 Unspecified macular degeneration: Secondary | ICD-10-CM | POA: Diagnosis not present

## 2021-08-21 DIAGNOSIS — M545 Low back pain, unspecified: Secondary | ICD-10-CM | POA: Diagnosis not present

## 2021-08-21 DIAGNOSIS — K5900 Constipation, unspecified: Secondary | ICD-10-CM | POA: Diagnosis not present

## 2021-08-21 DIAGNOSIS — M24111 Other articular cartilage disorders, right shoulder: Secondary | ICD-10-CM | POA: Diagnosis not present

## 2021-08-21 DIAGNOSIS — R32 Unspecified urinary incontinence: Secondary | ICD-10-CM | POA: Diagnosis not present

## 2021-08-23 ENCOUNTER — Other Ambulatory Visit: Payer: Medicare HMO

## 2021-08-25 DIAGNOSIS — Z961 Presence of intraocular lens: Secondary | ICD-10-CM | POA: Diagnosis not present

## 2021-08-25 DIAGNOSIS — H353231 Exudative age-related macular degeneration, bilateral, with active choroidal neovascularization: Secondary | ICD-10-CM | POA: Diagnosis not present

## 2021-08-25 DIAGNOSIS — H353122 Nonexudative age-related macular degeneration, left eye, intermediate dry stage: Secondary | ICD-10-CM | POA: Diagnosis not present

## 2021-08-25 DIAGNOSIS — D3132 Benign neoplasm of left choroid: Secondary | ICD-10-CM | POA: Diagnosis not present

## 2021-08-26 ENCOUNTER — Other Ambulatory Visit: Payer: Self-pay | Admitting: Internal Medicine

## 2021-08-28 DIAGNOSIS — M545 Low back pain, unspecified: Secondary | ICD-10-CM | POA: Diagnosis not present

## 2021-08-28 DIAGNOSIS — R32 Unspecified urinary incontinence: Secondary | ICD-10-CM | POA: Diagnosis not present

## 2021-08-28 DIAGNOSIS — M24111 Other articular cartilage disorders, right shoulder: Secondary | ICD-10-CM | POA: Diagnosis not present

## 2021-08-28 DIAGNOSIS — K5900 Constipation, unspecified: Secondary | ICD-10-CM | POA: Diagnosis not present

## 2021-08-28 DIAGNOSIS — Z9181 History of falling: Secondary | ICD-10-CM | POA: Diagnosis not present

## 2021-08-28 DIAGNOSIS — Z7901 Long term (current) use of anticoagulants: Secondary | ICD-10-CM | POA: Diagnosis not present

## 2021-08-28 DIAGNOSIS — M80062D Age-related osteoporosis with current pathological fracture, left lower leg, subsequent encounter for fracture with routine healing: Secondary | ICD-10-CM | POA: Diagnosis not present

## 2021-08-28 DIAGNOSIS — H3530 Unspecified macular degeneration: Secondary | ICD-10-CM | POA: Diagnosis not present

## 2021-08-28 DIAGNOSIS — K219 Gastro-esophageal reflux disease without esophagitis: Secondary | ICD-10-CM | POA: Diagnosis not present

## 2021-08-28 DIAGNOSIS — M170 Bilateral primary osteoarthritis of knee: Secondary | ICD-10-CM | POA: Diagnosis not present

## 2021-08-29 DIAGNOSIS — M25572 Pain in left ankle and joints of left foot: Secondary | ICD-10-CM | POA: Diagnosis not present

## 2021-08-29 DIAGNOSIS — M1712 Unilateral primary osteoarthritis, left knee: Secondary | ICD-10-CM | POA: Diagnosis not present

## 2021-08-29 DIAGNOSIS — S8265XA Nondisplaced fracture of lateral malleolus of left fibula, initial encounter for closed fracture: Secondary | ICD-10-CM | POA: Diagnosis not present

## 2021-09-01 ENCOUNTER — Other Ambulatory Visit: Payer: Medicare HMO

## 2021-09-04 DIAGNOSIS — K5900 Constipation, unspecified: Secondary | ICD-10-CM | POA: Diagnosis not present

## 2021-09-04 DIAGNOSIS — M24111 Other articular cartilage disorders, right shoulder: Secondary | ICD-10-CM | POA: Diagnosis not present

## 2021-09-04 DIAGNOSIS — M545 Low back pain, unspecified: Secondary | ICD-10-CM | POA: Diagnosis not present

## 2021-09-04 DIAGNOSIS — H3530 Unspecified macular degeneration: Secondary | ICD-10-CM | POA: Diagnosis not present

## 2021-09-04 DIAGNOSIS — Z9181 History of falling: Secondary | ICD-10-CM | POA: Diagnosis not present

## 2021-09-04 DIAGNOSIS — R32 Unspecified urinary incontinence: Secondary | ICD-10-CM | POA: Diagnosis not present

## 2021-09-04 DIAGNOSIS — K219 Gastro-esophageal reflux disease without esophagitis: Secondary | ICD-10-CM | POA: Diagnosis not present

## 2021-09-04 DIAGNOSIS — Z7901 Long term (current) use of anticoagulants: Secondary | ICD-10-CM | POA: Diagnosis not present

## 2021-09-04 DIAGNOSIS — M80062D Age-related osteoporosis with current pathological fracture, left lower leg, subsequent encounter for fracture with routine healing: Secondary | ICD-10-CM | POA: Diagnosis not present

## 2021-09-04 DIAGNOSIS — M170 Bilateral primary osteoarthritis of knee: Secondary | ICD-10-CM | POA: Diagnosis not present

## 2021-09-07 ENCOUNTER — Other Ambulatory Visit: Payer: Self-pay

## 2021-09-07 ENCOUNTER — Other Ambulatory Visit: Payer: Medicare HMO

## 2021-09-07 DIAGNOSIS — M810 Age-related osteoporosis without current pathological fracture: Secondary | ICD-10-CM | POA: Diagnosis not present

## 2021-09-07 MED ORDER — DENOSUMAB 60 MG/ML ~~LOC~~ SOSY
60.0000 mg | PREFILLED_SYRINGE | Freq: Once | SUBCUTANEOUS | Status: AC
  Administered 2021-09-07: 60 mg via SUBCUTANEOUS

## 2021-09-09 ENCOUNTER — Other Ambulatory Visit: Payer: Self-pay | Admitting: Family Medicine

## 2021-09-09 ENCOUNTER — Other Ambulatory Visit: Payer: Self-pay | Admitting: Internal Medicine

## 2021-09-09 DIAGNOSIS — I700 Atherosclerosis of aorta: Secondary | ICD-10-CM

## 2021-09-11 ENCOUNTER — Encounter: Payer: Self-pay | Admitting: Family Medicine

## 2021-09-11 ENCOUNTER — Ambulatory Visit (INDEPENDENT_AMBULATORY_CARE_PROVIDER_SITE_OTHER): Payer: Medicare HMO | Admitting: Family Medicine

## 2021-09-11 ENCOUNTER — Other Ambulatory Visit: Payer: Self-pay

## 2021-09-11 VITALS — BP 138/78 | HR 72 | Ht 61.0 in | Wt 153.0 lb

## 2021-09-11 DIAGNOSIS — R0789 Other chest pain: Secondary | ICD-10-CM

## 2021-09-11 NOTE — Patient Instructions (Signed)
Your tenderness on the right side of the chest is not related to your breast.  You likely have some inflammation at the cartilage where the rib meets the sternum (breastbone). ?You can try using warm compresses to this area, and tylenol as needed for any pain. ? ?The small lumps that were felt weren't tender and weren't concerning.  If you notice them growing in size, looking different, becoming more painful, growing in number, or any other concerns, return for re-evaluation. ? ?Get your screening mammogram as planned in April. ?Let us know if anything changes. ? ?

## 2021-09-11 NOTE — Progress Notes (Signed)
Chief Complaint  ?Patient presents with  ? Breast Mass  ?  Patient noticed lump in right breast about a week ago, called [facility] today and tried to get diagnotic mammo but they needed a doctor's order.  ? ?3-4 nights ago she had indigestion, rubbed her chest, and noted a lump in the upper chest/right breast.  It wasn't tender at all, just noted a lump.  She felt it earlier today, but having trouble feeling it while in office. ?She has no other breast concerns. ?Last mammo 04/2020, and is scheduled for screening mammogram 4/13. ? ? ?PMH, PSH, SH reviewed ? ?Outpatient Encounter Medications as of 09/11/2021  ?Medication Sig Note  ? apixaban (ELIQUIS) 5 MG TABS tablet Take 1 tablet (5 mg total) by mouth 2 (two) times daily.   ? flecainide (TAMBOCOR) 50 MG tablet TAKE 1 TABLET TWICE A DAY  *HIKMA*   ? fluticasone (FLONASE) 50 MCG/ACT nasal spray Place 1 spray into both nostrils daily as needed for allergies or rhinitis.   ? metoprolol tartrate (LOPRESSOR) 25 MG tablet Take 1 tablet (25 mg total) by mouth 2 (two) times daily. Please make overdue appt with Dr. Caryl Comes before anymore refills. Thank you 1st attempt   ? Multiple Vitamin (MULTIVITAMIN WITH MINERALS) TABS tablet Take 1 tablet by mouth daily.   ? Multiple Vitamins-Minerals (ICAPS AREDS 2 PO) Take 1 capsule by mouth 2 (two) times a day.   ? omeprazole (PRILOSEC) 20 MG capsule Take 1 capsule (20 mg total) by mouth daily.   ? psyllium (METAMUCIL) 58.6 % powder Take 1 packet by mouth daily.   ? rosuvastatin (CRESTOR) 10 MG tablet TAKE 1 TABLET DAILY   ? vitamin B-12 (CYANOCOBALAMIN) 1000 MCG tablet Take 1 tablet by mouth daily.   ? acetaminophen (TYLENOL) 500 MG tablet Take 1,000 mg by mouth every 6 (six) hours as needed. (Patient not taking: Reported on 09/11/2021)   ? diphenhydrAMINE (BENADRYL) 25 MG tablet Take 25 mg by mouth every 6 (six) hours as needed. (Patient not taking: Reported on 09/11/2021) 09/11/2021: As needed  ? [DISCONTINUED] Apoaequorin (PREVAGEN) 10 MG  CAPS Take 1 capsule by mouth daily.   ? [DISCONTINUED] HYDROcodone-acetaminophen (NORCO/VICODIN) 5-325 MG tablet Take 1 tablet by mouth every 6 (six) hours as needed for moderate pain. (Patient not taking: Reported on 06/07/2021) 06/07/2021: Only takes a needed  ? [DISCONTINUED] oxyCODONE-acetaminophen (PERCOCET/ROXICET) 5-325 MG tablet Take 1 tablet by mouth every 8 (eight) hours as needed for severe pain.   ? [DISCONTINUED] Probiotic Product (PROBIOTIC DAILY PO) Take 1 capsule by mouth daily.   ? ?No facility-administered encounter medications on file as of 09/11/2021.  ? ?Allergies  ?Allergen Reactions  ? Ciprofloxacin Shortness Of Breath and Rash  ? Scallops [Shellfish Allergy] Anaphylaxis  ? Demerol Other (See Comments)  ?  Patient states it made her crazy and sleep for 12 hours  ? Doxycycline Nausea And Vomiting  ? Milk-Related Compounds Diarrhea  ? Penicillins Other (See Comments)  ?  Did it involve swelling of the face/tongue/throat, SOB, or low BP? Unknown ?Did it involve sudden or severe rash/hives, skin peeling, or any reaction on the inside of your mouth or nose? Unknown ?Did you need to seek medical attention at a hospital or doctor's office? Unknown ?When did it last happen?       ?If all above answers are ?NO?, may proceed with cephalosporin use. ?  ? Rivaroxaban Other (See Comments)  ?  Pt unsure but d/c use  ? Sudafed [Pseudoephedrine  Hcl] Other (See Comments)  ?  hyperactive  ? ? ?ROS: no fever, chills, URI symptoms, GI complaints, chest pain. Moods are good. ? ? ? ?PHYSICAL EXAM: ? ?BP 138/78   Pulse 72   Ht '5\' 1"'$  (1.549 m)   Wt 153 lb (69.4 kg)   BMI 28.91 kg/m?  ? ?Wt Readings from Last 3 Encounters:  ?09/11/21 153 lb (69.4 kg)  ?06/29/21 145 lb (65.8 kg)  ?06/07/21 155 lb (70.3 kg)  ? ?Pleasant, elderly female, in good spirits.  She is accompanied by her daughter ?HEENT: conjunctiva and sclera are clear, EOMI, wearing mask ?Neck: no lymphadenopathy ?Heart: regular rate and rhythm ?Lungs:  clear ?Chest: R chest wall:  tender at 3 levels of costochondral junctions on the right side. Only minimally tender at the same levels on the left.  There is a very soft, small subcutaneous nodule/mass near the CC junction on the R, which wasn't tender. Mobile, soft. ?Breast: R breast--very small and soft, mobile, nontender nodule/soft tissue mass present at approx 12:30-1 o'clock position (7 oclock position from the hemangioma on the upper R breast). ?No skin dimpling, other masses, axillary lymphadenopathy or breast tenderness ?L breast--mild more fibroglandular tissues at UOQ, no discrete mass, nontender, no lymphadenopathy ?Abdomen: soft, nontender ?Extremities: no edema ?Psych: normal mood, affect, hygiene and grooming ?Neuro: alert and oriented. Using walker, normal strength. ? ? ?ASSESSMENT/PLAN: ? ?Chest wall pain - suspect mild costochondritis, rec moist heat, tylenol prn ? ?No worrisome findings on exam today.  Very tiny, soft, mobile subcutaneous masses at the R medial chest, and one small area at R breast.  Not particularly concerning, and this was NOT the concern noted by patient.  I think it is fine to get the screening mammogram as scheduled for next month.  To let us know if any changes develop. ? ?

## 2021-09-13 DIAGNOSIS — M545 Low back pain, unspecified: Secondary | ICD-10-CM | POA: Diagnosis not present

## 2021-09-13 DIAGNOSIS — Z9181 History of falling: Secondary | ICD-10-CM | POA: Diagnosis not present

## 2021-09-13 DIAGNOSIS — M80062D Age-related osteoporosis with current pathological fracture, left lower leg, subsequent encounter for fracture with routine healing: Secondary | ICD-10-CM | POA: Diagnosis not present

## 2021-09-13 DIAGNOSIS — R32 Unspecified urinary incontinence: Secondary | ICD-10-CM | POA: Diagnosis not present

## 2021-09-13 DIAGNOSIS — K219 Gastro-esophageal reflux disease without esophagitis: Secondary | ICD-10-CM | POA: Diagnosis not present

## 2021-09-13 DIAGNOSIS — M24111 Other articular cartilage disorders, right shoulder: Secondary | ICD-10-CM | POA: Diagnosis not present

## 2021-09-13 DIAGNOSIS — H3530 Unspecified macular degeneration: Secondary | ICD-10-CM | POA: Diagnosis not present

## 2021-09-13 DIAGNOSIS — Z7901 Long term (current) use of anticoagulants: Secondary | ICD-10-CM | POA: Diagnosis not present

## 2021-09-13 DIAGNOSIS — M170 Bilateral primary osteoarthritis of knee: Secondary | ICD-10-CM | POA: Diagnosis not present

## 2021-09-13 DIAGNOSIS — K5900 Constipation, unspecified: Secondary | ICD-10-CM | POA: Diagnosis not present

## 2021-09-29 DIAGNOSIS — H353231 Exudative age-related macular degeneration, bilateral, with active choroidal neovascularization: Secondary | ICD-10-CM | POA: Diagnosis not present

## 2021-10-05 ENCOUNTER — Ambulatory Visit: Payer: Medicare HMO

## 2021-10-23 ENCOUNTER — Encounter: Payer: Self-pay | Admitting: Internal Medicine

## 2021-10-23 ENCOUNTER — Ambulatory Visit: Payer: Medicare HMO | Admitting: Internal Medicine

## 2021-10-23 VITALS — BP 110/62 | HR 63 | Ht 60.0 in | Wt 157.0 lb

## 2021-10-23 DIAGNOSIS — I480 Paroxysmal atrial fibrillation: Secondary | ICD-10-CM | POA: Diagnosis not present

## 2021-10-23 DIAGNOSIS — Z79899 Other long term (current) drug therapy: Secondary | ICD-10-CM

## 2021-10-23 NOTE — Patient Instructions (Signed)
Medication Instructions:  ?Your physician recommends that you continue on your current medications as directed. Please refer to the Current Medication list given to you today. ? ?*If you need a refill on your cardiac medications before your next appointment, please call your pharmacy* ? ? ?Lab Work: ?CBC and BMET ?If you have labs (blood work) drawn today and your tests are completely normal, you will receive your results only by: ?MyChart Message (if you have MyChart) OR ?A paper copy in the mail ?If you have any lab test that is abnormal or we need to change your treatment, we will call you to review the results. ? ? ?Testing/Procedures: ?None ordered. ? ? ? ?Follow-Up: ?At [facility], you and your health needs are our priority.  As part of our continuing mission to provide you with exceptional heart care, we have created designated Provider Care Teams.  These Care Teams include your primary Cardiologist (physician) and Advanced Practice Providers (APPs -  Physician Assistants and Nurse Practitioners) who all work together to provide you with the care you need, when you need it. ? ?We recommend signing up for the patient portal called "MyChart".  Sign up information is provided on this After Visit Summary.  MyChart is used to connect with patients for Virtual Visits (Telemedicine).  Patients are able to view lab/test results, encounter notes, upcoming appointments, etc.  Non-urgent messages can be sent to your provider as well.   ?To learn more about what you can do with MyChart, go to [email or web address].   ? ?Your next appointment:   ?12 months with Dr Caryl Comes ? ?Important Information About Sugar ? ? ? ? ?  ?

## 2021-10-23 NOTE — Progress Notes (Signed)
? ? ? ? ?Patient Care Team: ?Rita Ohara, MD as PCP - General (Family Medicine) ?Deboraha Sprang, MD as PCP - Electrophysiology (Cardiology) ?Viona Gilmore, [facility] as Pharmacist (Pharmacist) ? ? ?HPI ? ?Samantha Foley is a 88 y.o. female ?Seen in followup for paroxysmal atrial fibrillation of long-standing.  Anticoagulation with Apixoban (5 mg twice daily--cr < 1.5 and weight > 60 kg) ?  ?The patient denies chest pain, shortness of breath, nocturnal dyspnea, orthopnea or peripheral edema.  There have been no palpitations, lightheadedness or syncope.   ? ?Her biggest complaint is that she has been days and bad days, the latter typically precedes the former. ? ?She also does not sleep well. ? ?DATE PR interval QRSduration PQRS Dose  ?6/21  156 78 234 0  ?5/23 178 86 264 50  ? ? ?  ? ?Date Cr K Hgb  ?10/21 0.65 4.3 13.2  ? 7/22 0.82 3.9 12.8  ? ? ?  ? ?He called her [name]  She met him when she was 13 ? ? ? ? ?Past Medical History:  ?Diagnosis Date  ? Arthritis   ? Atherosclerosis of aorta ([place])   ? noted on chest CT 10/2015  ? Atrial fibrillation ([place])   ? reported but not yet documented  ? Baker cyst, left 06/2017  ? noted to have large, complex cyst on [place]  ? Chronic back pain   ? Dr. Tonita Cong  ? Chronic knee pain   ? Dr. Wynelle Link  ? Closed compression fracture of body of L1 vertebra ([place]) 05/2009  ? vertebroplasty 06/02/2009  ? Closed compression fracture of L3 lumbar vertebra, initial encounter ([place]) 05/2015  ? vertebroplasty 07/01/2015  ? Colon polyp   ? Dr. Wynetta Emery  ? Coronary atherosclerosis   ? noted on chest CT 10/2015  ? Dysrhythmia   ? atrial fib- mild per pt- no meds  ? Food allergy   ? Scallops--no other seafood allergies  ? GERD (gastroesophageal reflux disease)   ? Granuloma annulare 09/2016  ? Dr. Syble Creek  ? Hiatal hernia   ? large, noted on EGD 01/14/13  ? Hyperlipidemia   ? Impaired glucose tolerance   ? Knee hemarthrosis, left 03/2017  ? Lactose intolerance   ? Macular degeneration   ? bilateral  ?  Osteopenia   ? Osteoporosis   ? treated with Forteo in 2011 by Dr. Earlie Counts  ? PVC's (premature ventricular contractions)   ? Seasonal allergies   ? Shoulder pain, right   ? 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 Tonita Cong, declines sx)  ? Spinal stenosis   ? has had injections  ? Vision problem   ? wears contacts  ? ? ?Past Surgical History:  ?Procedure Laterality Date  ? BACK SURGERY    ? several vertbea ae rcemntekd  ? CHOLECYSTECTOMY N/A 11/05/2018  ? Procedure: LAPAROSCOPIC CHOLECYSTECTOMY;  Surgeon: Clovis Riley, MD;  Location: [facility];  Service: General;  Laterality: N/A;  ? ROBOTIC ASSISTED TOTAL HYSTERECTOMY WITH BILATERAL SALPINGO OOPHERECTOMY Bilateral 03/08/2016  ? Procedure: ROBOTIC ASSISTED TOTAL HYSTERECTOMY WITH BILATERAL SALPINGO OOPHORECTOMY/Uterosacral Ligament Suspension;  Surgeon: Princess Bruins, MD;  Location: [place] [facility];  Service: Gynecology;  Laterality: Bilateral;  ? sebaceous cyst excision  2009  ? neck  ? TONSILLECTOMY AND ADENOIDECTOMY  age 63  ? VERTEBROPLASTY  05/2009  ? L1  ? VERTEBROPLASTY  07/01/2015  ? L3  ? WISDOM TOOTH EXTRACTION    ? ? ?Current Outpatient  Medications  ?Medication Sig Dispense Refill  ? acetaminophen (TYLENOL) 500 MG tablet Take 1,000 mg by mouth every 6 (six) hours as needed.    ? apixaban (ELIQUIS) 5 MG TABS tablet Take 1 tablet (5 mg total) by mouth 2 (two) times daily. 180 tablet 1  ? diphenhydrAMINE (BENADRYL) 25 MG tablet Take 25 mg by mouth every 6 (six) hours as needed.    ? flecainide (TAMBOCOR) 50 MG tablet TAKE 1 TABLET TWICE A DAY  *HIKMA* 180 tablet 0  ? fluticasone (FLONASE) 50 MCG/ACT nasal spray Place 1 spray into both nostrils daily as needed for allergies or rhinitis.    ? metoprolol tartrate (LOPRESSOR) 25 MG tablet Take 1 tablet (25 mg total) by mouth 2 (two) times daily. Please make overdue appt with Dr. Caryl Comes before anymore refills. Thank you 1st attempt 60 tablet 0  ? Multiple Vitamin (MULTIVITAMIN  WITH MINERALS) TABS tablet Take 1 tablet by mouth daily.    ? Multiple Vitamins-Minerals (ICAPS AREDS 2 PO) Take 1 capsule by mouth 2 (two) times a day.    ? omeprazole (PRILOSEC) 20 MG capsule Take 1 capsule (20 mg total) by mouth daily. 90 capsule 3  ? psyllium (METAMUCIL) 58.6 % powder Take 1 packet by mouth daily.    ? rosuvastatin (CRESTOR) 10 MG tablet TAKE 1 TABLET DAILY 90 tablet 1  ? vitamin B-12 (CYANOCOBALAMIN) 1000 MCG tablet Take 1 tablet by mouth daily.    ? ?No current facility-administered medications for this visit.  ? ? ?Allergies  ?Allergen Reactions  ? Ciprofloxacin Shortness Of Breath and Rash  ? Scallops [Shellfish Allergy] Anaphylaxis  ? Demerol Other (See Comments)  ?  Patient states it made her crazy and sleep for 12 hours  ? Doxycycline Nausea And Vomiting  ? Milk-Related Compounds Diarrhea  ? Penicillins Other (See Comments)  ?  Did it involve swelling of the face/tongue/throat, SOB, or low BP? Unknown ?Did it involve sudden or severe rash/hives, skin peeling, or any reaction on the inside of your mouth or nose? Unknown ?Did you need to seek medical attention at a hospital or doctor's office? Unknown ?When did it last happen?       ?If all above answers are ?NO?, may proceed with cephalosporin use. ?  ? Rivaroxaban Other (See Comments)  ?  Pt unsure but d/c use  ? Sudafed [Pseudoephedrine Hcl] Other (See Comments)  ?  hyperactive  ? ? ?Review of Systems negative except from HPI and PMH ? ?Physical Exam ?BP 110/62   Pulse 63   Ht 5' (1.524 m)   Wt 157 lb (71.2 kg)   SpO2 95%   BMI 30.66 kg/m?  ?Well developed and nourished in no acute distress ?Well developed and well nourished in no acute distress ?HENT normal ?Neck supple with JVP-flat ?Clear ?Device pocket well healed; without hematoma or erythema.  There is no tethering  ?Regular rate and rhythm, no  murmur ?Abd-soft with active BS ?No Clubbing cyanosis  edema ?Skin-warm and dry ?A & Oriented  Grossly normal sensory and motor  function ? ?ECG sinus at 63 ?Intervals 18/09/41 ? ?  ?Assessment and Plan  ? ?Paroxysmal atrial fibrillation  ? ?No interval atrial fibrillation of which she is aware.  No palpitations.  We will continue flecainide 50 twice daily. ? ?No bleeding on the anticoagulation.  We will continue dosed for age and weight ? ?O  ? ?  ?

## 2021-10-24 LAB — BASIC METABOLIC PANEL
BUN/Creatinine Ratio: 18 (ref 12–28)
BUN: 11 mg/dL (ref 8–27)
CO2: 27 mmol/L (ref 20–29)
Calcium: 9.1 mg/dL (ref 8.7–10.3)
Chloride: 105 mmol/L (ref 96–106)
Creatinine, Ser: 0.62 mg/dL (ref 0.57–1.00)
Glucose: 99 mg/dL (ref 70–99)
Potassium: 4.2 mmol/L (ref 3.5–5.2)
Sodium: 144 mmol/L (ref 134–144)
eGFR: 85 mL/min/{1.73_m2} (ref >59)

## 2021-10-24 LAB — CBC
Hematocrit: 37.6 % (ref 34.0–46.6)
Hemoglobin: 12.7 g/dL (ref 11.1–15.9)
MCH: 29.9 pg (ref 26.6–33.0)
MCHC: 33.8 g/dL (ref 31.5–35.7)
MCV: 89 fL (ref 79–97)
Platelets: 196 10*3/uL (ref 150–450)
RBC: 4.25 x10E6/uL (ref 3.77–5.28)
RDW: 13.1 % (ref 11.7–15.4)
WBC: 10.1 10*3/uL (ref 3.4–10.8)

## 2021-11-07 ENCOUNTER — Telehealth: Payer: Medicare HMO | Admitting: Family Medicine

## 2021-11-08 ENCOUNTER — Encounter: Payer: Self-pay | Admitting: Family Medicine

## 2021-11-08 ENCOUNTER — Ambulatory Visit (INDEPENDENT_AMBULATORY_CARE_PROVIDER_SITE_OTHER): Payer: Medicare HMO | Admitting: Family Medicine

## 2021-11-08 VITALS — Temp 96.8°F | Ht 60.0 in | Wt 155.0 lb

## 2021-11-08 DIAGNOSIS — U071 COVID-19: Secondary | ICD-10-CM | POA: Diagnosis not present

## 2021-11-08 MED ORDER — MOLNUPIRAVIR EUA 200MG CAPSULE: 4.0000 | ORAL_CAPSULE | Freq: Two times a day (BID) | ORAL | 0 refills | Status: AC

## 2021-11-08 NOTE — Patient Instructions (Signed)
Stay well hydrated. ?Take the molnupiravir twice daily for 5 days. ?Use Mucinex DM to help with your cough. ?Use tylenol as needed for any fever, pain, sore throat. ?You can try salt water gargles or chloraseptic spray if needed for sore throat. ? ?Contact us if your symptoms persist or worsen in any way. ? ?Isolate through Friday. ?Continue to wear a mask if leaving your home Saturday through Wednesday of next week. ? ?Seek re-evaluation if you have persistent fever, worsening cough, pain with breathing, shortness of breath (either at rest or with activity), significant leg swelling/pain, severe headaches or neurologic changes. ?

## 2021-11-08 NOTE — Progress Notes (Signed)
Start time: 12:35 End time: 1:01  Virtual Visit via Telephone Note  I connected with Samantha Foley on 11/08/21 by telephone and verified that I am speaking with the correct person using two identifiers. Patient reported inability to do video visit.  Location: Patient: home, along with her daughter Samantha Foley Provider: office   I discussed the limitations of evaluation and management by telemedicine and the availability of in person appointments. The patient expressed understanding and agreed to proceed.  History of Present Illness:  Chief Complaint  Patient presents with   Covid Positive    PHONE CALL tested positive this morning, home test. Symptoms began Sunday. ST, cough and body aches. Fever off and on. Has not lost taste.    Sunday she started with chills, later developed sore throat. During the week, symptoms worsened--more PND, cough. She has persistent sore throat, feels achey in knees. Low grade fever (99-100.6).  No chills since the first day. Denies shortness of breath or DOE. Denies runny nose, is all postnasal drainage.  Denies sinus pain.  No nausea, vomiting.  Slight diarrhea. No rash.  Negative COVID tests on Sunday night and Monday. Tested positive today.  Daughter arrived 1 week ago, she thought her allergies flared.  Sunday night, when her mother started feeling bad, she did a COVID test and was +  Had COVID vaccines x 5 (last in the Fall)  PMH, PSH, [place] reviewed  Outpatient Encounter Medications as of 11/08/2021  Medication Sig Note   acetaminophen (TYLENOL) 500 MG tablet Take 1,000 mg by mouth every 6 (six) hours as needed. 11/08/2021: Last dose 10pm last night   apixaban (ELIQUIS) 5 MG TABS tablet Take 1 tablet (5 mg total) by mouth 2 (two) times daily.    flecainide (TAMBOCOR) 50 MG tablet TAKE 1 TABLET TWICE A DAY  *HIKMA*    loratadine (CLARITIN) 10 MG tablet Take 10 mg by mouth daily.    metoprolol tartrate (LOPRESSOR) 25 MG tablet Take 1 tablet (25  mg total) by mouth 2 (two) times daily. Please make overdue appt with Dr. Caryl Comes before anymore refills. Thank you 1st attempt    molnupiravir EUA (LAGEVRIO) 200 mg CAPS capsule Take 4 capsules (800 mg total) by mouth 2 (two) times daily for 5 days.    Multiple Vitamin (MULTIVITAMIN WITH MINERALS) TABS tablet Take 1 tablet by mouth daily.    Multiple Vitamins-Minerals (ICAPS AREDS 2 PO) Take 1 capsule by mouth 2 (two) times a day.    omeprazole (PRILOSEC) 20 MG capsule Take 1 capsule (20 mg total) by mouth daily.    rosuvastatin (CRESTOR) 10 MG tablet TAKE 1 TABLET DAILY    vitamin B-12 (CYANOCOBALAMIN) 1000 MCG tablet Take 1 tablet by mouth daily.    diphenhydrAMINE (BENADRYL) 25 MG tablet Take 25 mg by mouth every 6 (six) hours as needed. (Patient not taking: Reported on 11/08/2021)    fluticasone (FLONASE) 50 MCG/ACT nasal spray Place 1 spray into both nostrils daily as needed for allergies or rhinitis. (Patient not taking: Reported on 11/08/2021) 11/08/2021: prn   psyllium (METAMUCIL) 58.6 % powder Take 1 packet by mouth daily. (Patient not taking: Reported on 11/08/2021) 11/08/2021: prn   No facility-administered encounter medications on file as of 11/08/2021.   NOT taking molnupiravir prior to today's visit  Allergies  Allergen Reactions   Ciprofloxacin Shortness Of Breath and Rash   Scallops [Shellfish Allergy] Anaphylaxis   Demerol Other (See Comments)    Patient states it made her crazy and  sleep for 12 hours   Doxycycline Nausea And Vomiting   Milk-Related Compounds Diarrhea   Penicillins Other (See Comments)    Did it involve swelling of the face/tongue/throat, SOB, or low BP? Unknown Did it involve sudden or severe rash/hives, skin peeling, or any reaction on the inside of your mouth or nose? Unknown Did you need to seek medical attention at a hospital or doctor's office? Unknown When did it last happen?       If all above answers are "NO", may proceed with cephalosporin use.     Rivaroxaban Other (See Comments)    Pt unsure but d/c use   Sudafed [Pseudoephedrine Hcl] Other (See Comments)    hyperactive   ROS:  URI symptoms, fever, diarrhea per HPI.  No chest pain, shortness of breath, n/v, rash, urinary complaints or other concerns. See HPI.   Observations/Objective:  Temp (!) 96.8 F (36 C) (Temporal)   Ht 5' (1.524 m)   Wt 155 lb (70.3 kg)   BMI 30.27 kg/m   Hoarse voice with intermittent coughing. She is speaking easily/comfortably She is alert and oriented. Daughter is also present during the phone call. Exam is limited due to the virtual nature of the visit.   Assessment and Plan:  COVID-19 virus infection - supportive measures reviewed; treat with molnupiravir - Plan: molnupiravir EUA (LAGEVRIO) 200 mg CAPS capsule   Day 1 was Monday. Discussed isolation and masking recommendations. Treat with Molnupiravir, OTC Mucinex DM. Confirmed no interactions with her heart meds.  Reviewed expected course, and s/sx for which she should seek re-evaluation.  Will have nurse check [facility] for 5th COVID ([employer]), only 4 in chart.   Follow Up Instructions:    I discussed the assessment and treatment plan with the patient. The patient was provided an opportunity to ask questions and all were answered. The patient agreed with the plan and demonstrated an understanding of the instructions.   The patient was advised to call back or seek an in-person evaluation if the symptoms worsen or if the condition fails to improve as anticipated.  I spent 28 minutes dedicated to the care of this patient, including pre-visit review of records, face to face time, post-visit ordering of testing and documentation.    Samantha Ports, MD

## 2021-11-13 ENCOUNTER — Telehealth: Payer: Self-pay | Admitting: Family Medicine

## 2021-11-13 NOTE — Telephone Encounter (Signed)
I agree.  Let me know if they expect a call back from me.  Just needs more time to recover, or contact us if new/worsening symptoms.

## 2021-11-13 NOTE — Telephone Encounter (Signed)
I called them back and they seemed just fine. No need to call, patient was very happy and understood what I said to her. She will call if any changing/worsening symptoms.

## 2021-11-13 NOTE — Telephone Encounter (Signed)
I don't have time to call between patients--can you please call and get details. Ensure eating/drinking, checking vital signs (BP, pulse)

## 2021-11-13 NOTE — Telephone Encounter (Signed)
Please call,   Daughter Samantha Foley called. Pt had virtual visit on Wednesday for Covid Most of her symptoms are gone but she is very, very, very weak and all she wants to do is sleep per daughter She is eating some

## 2021-11-13 NOTE — Telephone Encounter (Signed)
Spoke with patient and her daughter, Cecille Rubin. They jusy wanted to make sure it was normal post covid to feel extra tired and sleep more than normal. I assured her that this was normal, she just had a virus and she may be more tired than normal. She said she slept for 12 hours last night. She is eating and drinking totally normal and her covid symptoms have almost completely resolved. She retested today and the line was light pink. She does not have a bp cuff at the house. She in urinating as normal and does not think she is dehydrated.

## 2021-11-25 ENCOUNTER — Other Ambulatory Visit: Payer: Self-pay | Admitting: Internal Medicine

## 2021-11-25 DIAGNOSIS — I480 Paroxysmal atrial fibrillation: Secondary | ICD-10-CM

## 2021-11-27 NOTE — Telephone Encounter (Signed)
Prescription refill request for Eliquis received. Indication: PAF Last office visit: 10/23/21  Olin Pia MD Scr: 0.62 on 10/23/21 Age: 88 Weight: 71.2kg  Based on above findings Eliquis '5mg'$  twice daily is the appropriate dose.  Refill approved.

## 2021-11-29 DIAGNOSIS — N819 Female genital prolapse, unspecified: Secondary | ICD-10-CM | POA: Diagnosis not present

## 2021-11-29 DIAGNOSIS — Z4689 Encounter for fitting and adjustment of other specified devices: Secondary | ICD-10-CM | POA: Diagnosis not present

## 2021-11-29 DIAGNOSIS — N8111 Cystocele, midline: Secondary | ICD-10-CM | POA: Diagnosis not present

## 2021-11-29 DIAGNOSIS — N952 Postmenopausal atrophic vaginitis: Secondary | ICD-10-CM | POA: Diagnosis not present

## 2021-12-08 DIAGNOSIS — H353231 Exudative age-related macular degeneration, bilateral, with active choroidal neovascularization: Secondary | ICD-10-CM | POA: Diagnosis not present

## 2021-12-28 ENCOUNTER — Telehealth: Payer: Self-pay | Admitting: Internal Medicine

## 2021-12-28 MED ORDER — FLECAINIDE ACETATE 50 MG PO TABS
ORAL_TABLET | ORAL | 3 refills | Status: DC
Start: 2021-12-28 — End: 2021-12-28

## 2021-12-28 MED ORDER — METOPROLOL TARTRATE 25 MG PO TABS: 25.0000 mg | ORAL_TABLET | Freq: Two times a day (BID) | ORAL | 0 refills | Status: DC

## 2021-12-28 MED ORDER — METOPROLOL TARTRATE 25 MG PO TABS: 25.0000 mg | ORAL_TABLET | Freq: Two times a day (BID) | ORAL | 3 refills | Status: DC

## 2021-12-28 MED ORDER — FLECAINIDE ACETATE 50 MG PO TABS: ORAL_TABLET | ORAL | 0 refills | Status: DC

## 2021-12-28 NOTE — Telephone Encounter (Signed)
Pt's medications were sent to both pharmacies, local and mail order pharmacy. Confirmation for both.

## 2021-12-28 NOTE — Telephone Encounter (Signed)
*  STAT* If patient is at the pharmacy, call can be transferred to refill team.   1. Which medications need to be refilled? (please list name of each medication and dose if known)  flecainide (TAMBOCOR) 50 MG tablet metoprolol tartrate (LOPRESSOR) 25 MG tablet  2. Which pharmacy/location (including street and city if local pharmacy) is medication to be sent to? Please send a 14 day supply to [facility] Then send a 90 day supply [facility] [place] to Registered [facility] Sites  3. Do they need a 30 day or 90 day supply? 90 day to mail order servcie [facility]

## 2022-01-05 ENCOUNTER — Other Ambulatory Visit: Payer: Self-pay

## 2022-01-05 MED ORDER — FLECAINIDE ACETATE 50 MG PO TABS
ORAL_TABLET | ORAL | 2 refills | Status: DC
Start: 2022-01-05 — End: 2023-02-11

## 2022-01-08 DIAGNOSIS — Z4689 Encounter for fitting and adjustment of other specified devices: Secondary | ICD-10-CM | POA: Diagnosis not present

## 2022-01-08 DIAGNOSIS — N8111 Cystocele, midline: Secondary | ICD-10-CM | POA: Diagnosis not present

## 2022-01-08 DIAGNOSIS — N952 Postmenopausal atrophic vaginitis: Secondary | ICD-10-CM | POA: Diagnosis not present

## 2022-01-08 DIAGNOSIS — N819 Female genital prolapse, unspecified: Secondary | ICD-10-CM | POA: Diagnosis not present

## 2022-01-18 ENCOUNTER — Ambulatory Visit: Payer: Medicare HMO | Admitting: Family Medicine

## 2022-01-22 DIAGNOSIS — Z961 Presence of intraocular lens: Secondary | ICD-10-CM | POA: Diagnosis not present

## 2022-01-22 DIAGNOSIS — Z8669 Personal history of other diseases of the nervous system and sense organs: Secondary | ICD-10-CM | POA: Diagnosis not present

## 2022-01-22 DIAGNOSIS — H26493 Other secondary cataract, bilateral: Secondary | ICD-10-CM | POA: Diagnosis not present

## 2022-01-22 DIAGNOSIS — D3132 Benign neoplasm of left choroid: Secondary | ICD-10-CM | POA: Diagnosis not present

## 2022-01-22 DIAGNOSIS — H02833 Dermatochalasis of right eye, unspecified eyelid: Secondary | ICD-10-CM | POA: Diagnosis not present

## 2022-01-22 DIAGNOSIS — H353122 Nonexudative age-related macular degeneration, left eye, intermediate dry stage: Secondary | ICD-10-CM | POA: Diagnosis not present

## 2022-01-22 DIAGNOSIS — H35363 Drusen (degenerative) of macula, bilateral: Secondary | ICD-10-CM | POA: Diagnosis not present

## 2022-01-22 DIAGNOSIS — D3131 Benign neoplasm of right choroid: Secondary | ICD-10-CM | POA: Diagnosis not present

## 2022-01-22 DIAGNOSIS — H353231 Exudative age-related macular degeneration, bilateral, with active choroidal neovascularization: Secondary | ICD-10-CM | POA: Diagnosis not present

## 2022-01-22 DIAGNOSIS — H02402 Unspecified ptosis of left eyelid: Secondary | ICD-10-CM | POA: Diagnosis not present

## 2022-01-22 DIAGNOSIS — H18413 Arcus senilis, bilateral: Secondary | ICD-10-CM | POA: Diagnosis not present

## 2022-01-22 DIAGNOSIS — H02836 Dermatochalasis of left eye, unspecified eyelid: Secondary | ICD-10-CM | POA: Diagnosis not present

## 2022-02-07 ENCOUNTER — Telehealth: Payer: Self-pay | Admitting: *Deleted

## 2022-02-07 ENCOUNTER — Other Ambulatory Visit: Payer: Self-pay | Admitting: Family Medicine

## 2022-02-07 DIAGNOSIS — K219 Gastro-esophageal reflux disease without esophagitis: Secondary | ICD-10-CM

## 2022-02-07 DIAGNOSIS — I700 Atherosclerosis of aorta: Secondary | ICD-10-CM

## 2022-02-07 NOTE — Telephone Encounter (Signed)
Patient called and is having issues sleeping. She said you rx'd her something for sleep a while ago. She would like to know if you could prescribe something for her. I explained that she would need a visit. She wanted me to ask if you could otherwise she will wait until her appt next month to discuss with you.

## 2022-02-07 NOTE — Telephone Encounter (Signed)
Patient threw them out, she scheduled phone visit for Monday at 2:30.

## 2022-02-07 NOTE — Telephone Encounter (Signed)
I looked back to what we said last year--  Try taking Tylenol Arthritis at bedtime, instead of plain tylenol, to see if you sleep longer. You may want to re-try the trazadone at bedtime.  Don't take benadryl along with it.  If these make her groggy, try taking just half, or taking it a little earlier.  Happy to do a phone visit rather than waiting until her physical, to further discuss if this isn't helpful.

## 2022-02-12 ENCOUNTER — Telehealth: Payer: Medicare HMO | Admitting: Family Medicine

## 2022-02-14 NOTE — Progress Notes (Unsigned)
  No chief complaint on file.  Patient originally scheduled phone call visit to discuss trouble sleeping. This was rescheduled to an in-person visit, to address trouble breathing.  She is accompanied by her daughter Samantha Foley today.   She had COVID in May  She has chronic atrial fib, under the care of Dr. Caryl Comes.  Last saw him in 10/2021. No changes were made. She continues on flecainide and metoprolol.  The patient denies chest pain, shortness of breath, nocturnal dyspnea, orthopnea or peripheral edema.  There have been no palpitations, lightheadedness or syncope.    Insomnia: Last year we recommended: Try taking Tylenol Arthritis at bedtime, instead of plain tylenol, to see if you sleep longer. You may want to re-try the trazadone at bedtime.  Don't take benadryl along with it. If these make her groggy, try taking just half, or taking it a little earlier. She threw the Trazodone out.   PMH, PSH, SH reviewed   ROS:   PHYSICAL EXAM:  There were no vitals taken for this visit.  Wt Readings from Last 3 Encounters:  11/08/21 155 lb (70.3 kg)  10/23/21 157 lb (71.2 kg)  09/11/21 153 lb (69.4 kg)      ASSESSMENT/PLAN:   Insomnia--trazodone, or Belsomra?   Scheduled for AWV 9/14

## 2022-02-15 ENCOUNTER — Encounter: Payer: Self-pay | Admitting: Family Medicine

## 2022-02-15 ENCOUNTER — Ambulatory Visit (INDEPENDENT_AMBULATORY_CARE_PROVIDER_SITE_OTHER): Payer: Medicare HMO | Admitting: Family Medicine

## 2022-02-15 VITALS — BP 110/78 | HR 68 | Wt 162.0 lb

## 2022-02-15 DIAGNOSIS — H353233 Exudative age-related macular degeneration, bilateral, with inactive scar: Secondary | ICD-10-CM | POA: Diagnosis not present

## 2022-02-15 DIAGNOSIS — Z961 Presence of intraocular lens: Secondary | ICD-10-CM | POA: Diagnosis not present

## 2022-02-15 DIAGNOSIS — H43812 Vitreous degeneration, left eye: Secondary | ICD-10-CM | POA: Diagnosis not present

## 2022-02-15 DIAGNOSIS — R0602 Shortness of breath: Secondary | ICD-10-CM

## 2022-02-15 DIAGNOSIS — G4700 Insomnia, unspecified: Secondary | ICD-10-CM

## 2022-02-15 DIAGNOSIS — K219 Gastro-esophageal reflux disease without esophagitis: Secondary | ICD-10-CM

## 2022-02-15 DIAGNOSIS — J309 Allergic rhinitis, unspecified: Secondary | ICD-10-CM | POA: Diagnosis not present

## 2022-02-15 DIAGNOSIS — H5213 Myopia, bilateral: Secondary | ICD-10-CM | POA: Diagnosis not present

## 2022-02-15 NOTE — Patient Instructions (Addendum)
Stop having ice cream pops before bed. Consider Sleepytime tea or other herbal tea in the evening. Try not watching TV in bed. Limit naps to only 20 minutes, if possible. Get into bed when you are sleepy.  Take the Mucinex 600 mg 12 hour tablets TWICE daily. Continue your loratidine and flonase.  Go to [facility] for a chest x-ray

## 2022-02-21 ENCOUNTER — Telehealth: Payer: Self-pay | Admitting: Internal Medicine

## 2022-02-21 NOTE — Telephone Encounter (Signed)
Patient states she is not sure but may have missed a dose of one of her medications. She uses a pill box and is not sure which medication it was. I advised she not take any extra since we are unclear if she missed a dose and proceed with her evening medications as directed. Also advised she review her medication box and medications for accuracy. Patient verbalized understanding.

## 2022-02-21 NOTE — Telephone Encounter (Signed)
Patient called and said that she just realized that she missed a dose of . Wants to know if she should take a double dose or leave it

## 2022-02-23 ENCOUNTER — Ambulatory Visit
Admission: RE | Admit: 2022-02-23 | Discharge: 2022-02-23 | Disposition: A | Discharge status: Home / Self Care | Payer: Medicare HMO | Source: Ambulatory Visit | Attending: Family Medicine | Admitting: Family Medicine

## 2022-02-23 DIAGNOSIS — R0602 Shortness of breath: Secondary | ICD-10-CM | POA: Diagnosis not present

## 2022-02-25 ENCOUNTER — Other Ambulatory Visit: Payer: Self-pay | Admitting: Family Medicine

## 2022-02-25 DIAGNOSIS — K219 Gastro-esophageal reflux disease without esophagitis: Secondary | ICD-10-CM

## 2022-02-27 NOTE — Telephone Encounter (Signed)
Left message for pt as well

## 2022-02-27 NOTE — Telephone Encounter (Signed)
Pt is not sure if she needs this or not but will call back if she does

## 2022-02-28 ENCOUNTER — Encounter: Payer: Self-pay | Admitting: Internal Medicine
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
## 2022-03-07 NOTE — Patient Instructions (Addendum)
HEALTH MAINTENANCE RECOMMENDATIONS:  It is recommended that you get at least 30 minutes of aerobic exercise at least 5 days/week (for weight loss, you may need as much as 60-90 minutes). This can be any activity that gets your heart rate up. This can be divided in 10-15 minute intervals if needed, but try and build up your endurance at least once a week.  Weight bearing exercise is also recommended twice weekly.  Eat a healthy diet with lots of vegetables, fruits and fiber.  "Colorful" foods have a lot of vitamins (ie green vegetables, tomatoes, red peppers, etc).  Limit sweet tea, regular sodas and alcoholic beverages, all of which has a lot of calories and sugar.  Up to 1 alcoholic drink daily may be beneficial for women (unless trying to lose weight, watch sugars).  Drink a lot of water.  Calcium recommendations are 1200-1500 mg daily (1500 mg for postmenopausal women or women without ovaries), and vitamin D 1000 IU daily.  This should be obtained from diet and/or supplements (vitamins), and calcium should not be taken all at once, but in divided doses.  Monthly self breast exams and yearly mammograms for women over the age of 69 is recommended.  Sunscreen of at least SPF 30 should be used on all sun-exposed parts of the skin when outside between the hours of 10 am and 4 pm (not just when at beach or pool, but even with exercise, golf, tennis, and yard work!)  Use a sunscreen that says "broad spectrum" so it covers both UVA and UVB rays, and make sure to reapply every 1-2 hours.  Remember to change the batteries in your smoke detectors when changing your clock times in the spring and fall. Carbon monoxide detectors are recommended for your home.  Use your seat belt every time you are in a car, and please drive safely and not be distracted with cell phones and texting while driving.   Samantha Foley , Thank you for taking time to come for your Medicare Wellness Visit. I appreciate your ongoing  commitment to your health goals. Please review the following plan we discussed and let me know if I can assist you in the future.   This is a list of the screening recommended for you and due dates:  Health Maintenance  Topic Date Due   COVID-19 Vaccine (6 - Pfizer risk series) 05/22/2021   Flu Shot  01/23/2022   Tetanus Vaccine  01/23/2025   Pneumonia Vaccine  Completed   DEXA scan (bone density measurement)  Completed   Zoster (Shingles) Vaccine  Completed   HPV Vaccine  Aged Out   Please schedule mammogram and bone density test at [facility].  I recommend getting the new RSV vaccine--you will need to get this from the pharmacy.  Wait at least 2 weeks from any other vaccine (ie today's flu shot).  I also recommend getting the updated COVID booster when it becomes available (separated from other vaccines by at least 2 weeks).  Check with Samantha Foley for when to schedule Prolia injection (due after 9/16).  If you have persistent or worsening hoarseness, we should get you in with Dr. Constance Holster or one of his colleagues. Let me know and we can put in a referral.  Try and get some exercise every day, start at 5 minutes at a time, and work your way up. I would do it seated, since you're by yourself and we don't want you falling.  Please bring [place] copies of your  Living Will and [place] once completed and notarized so that it can be scanned into your medical chart.

## 2022-03-08 ENCOUNTER — Encounter: Payer: Self-pay | Admitting: Family Medicine

## 2022-03-08 ENCOUNTER — Ambulatory Visit (INDEPENDENT_AMBULATORY_CARE_PROVIDER_SITE_OTHER): Payer: Medicare HMO | Admitting: Family Medicine

## 2022-03-08 VITALS — BP 130/72 | HR 68 | Ht 61.0 in | Wt 161.8 lb

## 2022-03-08 DIAGNOSIS — Z6830 Body mass index (BMI) 30.0-30.9, adult: Secondary | ICD-10-CM | POA: Diagnosis not present

## 2022-03-08 DIAGNOSIS — I480 Paroxysmal atrial fibrillation: Secondary | ICD-10-CM | POA: Diagnosis not present

## 2022-03-08 DIAGNOSIS — G4700 Insomnia, unspecified: Secondary | ICD-10-CM | POA: Diagnosis not present

## 2022-03-08 DIAGNOSIS — Z23 Encounter for immunization: Secondary | ICD-10-CM

## 2022-03-08 DIAGNOSIS — R5383 Other fatigue: Secondary | ICD-10-CM | POA: Diagnosis not present

## 2022-03-08 DIAGNOSIS — Z7901 Long term (current) use of anticoagulants: Secondary | ICD-10-CM

## 2022-03-08 DIAGNOSIS — I700 Atherosclerosis of aorta: Secondary | ICD-10-CM | POA: Diagnosis not present

## 2022-03-08 DIAGNOSIS — R490 Dysphonia: Secondary | ICD-10-CM | POA: Diagnosis not present

## 2022-03-08 DIAGNOSIS — M810 Age-related osteoporosis without current pathological fracture: Secondary | ICD-10-CM | POA: Diagnosis not present

## 2022-03-08 DIAGNOSIS — K219 Gastro-esophageal reflux disease without esophagitis: Secondary | ICD-10-CM | POA: Diagnosis not present

## 2022-03-08 DIAGNOSIS — Z5181 Encounter for therapeutic drug level monitoring: Secondary | ICD-10-CM | POA: Diagnosis not present

## 2022-03-08 DIAGNOSIS — D170 Benign lipomatous neoplasm of skin and subcutaneous tissue of head, face and neck: Secondary | ICD-10-CM

## 2022-03-08 DIAGNOSIS — Z0000 Encounter for general adult medical examination without abnormal findings: Secondary | ICD-10-CM

## 2022-03-08 DIAGNOSIS — D6869 Other thrombophilia: Secondary | ICD-10-CM

## 2022-03-08 DIAGNOSIS — J309 Allergic rhinitis, unspecified: Secondary | ICD-10-CM | POA: Diagnosis not present

## 2022-03-08 DIAGNOSIS — E7800 Pure hypercholesterolemia, unspecified: Secondary | ICD-10-CM | POA: Diagnosis not present

## 2022-03-08 MED ORDER — OMEPRAZOLE 20 MG PO CPDR: 20.0000 mg | DELAYED_RELEASE_CAPSULE | Freq: Every day | ORAL | 3 refills | Status: DC

## 2022-03-09 LAB — CBC WITH DIFFERENTIAL/PLATELET
Basophils Absolute: 0.1 10*3/uL (ref 0.0–0.2)
Basos: 1 %
EOS (ABSOLUTE): 0.1 10*3/uL (ref 0.0–0.4)
Eos: 1 %
Hematocrit: 37.5 % (ref 34.0–46.6)
Hemoglobin: 12.5 g/dL (ref 11.1–15.9)
Immature Grans (Abs): 0 10*3/uL (ref 0.0–0.1)
Immature Granulocytes: 0 %
Lymphocytes Absolute: 1.3 10*3/uL (ref 0.7–3.1)
Lymphs: 17 %
MCH: 29.5 pg (ref 26.6–33.0)
MCHC: 33.3 g/dL (ref 31.5–35.7)
MCV: 88 fL (ref 79–97)
Monocytes Absolute: 0.5 10*3/uL (ref 0.1–0.9)
Monocytes: 7 %
Neutrophils Absolute: 5.4 10*3/uL (ref 1.4–7.0)
Neutrophils: 74 %
Platelets: 196 10*3/uL (ref 150–450)
RBC: 4.24 x10E6/uL (ref 3.77–5.28)
RDW: 13.1 % (ref 11.7–15.4)
WBC: 7.3 10*3/uL (ref 3.4–10.8)

## 2022-03-09 LAB — COMPREHENSIVE METABOLIC PANEL
ALT: 16 IU/L (ref 0–32)
AST: 19 IU/L (ref 0–40)
Albumin/Globulin Ratio: 1.8 (ref 1.2–2.2)
Albumin: 4.2 g/dL (ref 3.7–4.7)
Alkaline Phosphatase: 92 IU/L (ref 44–121)
BUN/Creatinine Ratio: 19 (ref 12–28)
BUN: 14 mg/dL (ref 8–27)
Bilirubin Total: 0.3 mg/dL (ref 0.0–1.2)
CO2: 24 mmol/L (ref 20–29)
Calcium: 9.6 mg/dL (ref 8.7–10.3)
Chloride: 102 mmol/L (ref 96–106)
Creatinine, Ser: 0.73 mg/dL (ref 0.57–1.00)
Globulin, Total: 2.3 g/dL (ref 1.5–4.5)
Glucose: 86 mg/dL (ref 70–99)
Potassium: 4 mmol/L (ref 3.5–5.2)
Sodium: 141 mmol/L (ref 134–144)
Total Protein: 6.5 g/dL (ref 6.0–8.5)
eGFR: 79 mL/min/{1.73_m2} (ref >59)

## 2022-03-09 LAB — LIPID PANEL
Chol/HDL Ratio: 2.6 ratio (ref 0.0–4.4)
Cholesterol, Total: 146 mg/dL (ref 100–199)
HDL: 57 mg/dL (ref >39)
LDL Chol Calc (NIH): 71 mg/dL (ref 0–99)
Triglycerides: 101 mg/dL (ref 0–149)
VLDL Cholesterol Cal: 18 mg/dL (ref 5–40)

## 2022-03-09 LAB — TSH: TSH: 1.43 u[IU]/mL (ref 0.450–4.500)

## 2022-03-09 LAB — VITAMIN D 25 HYDROXY (VIT D DEFICIENCY, FRACTURES): Vit D, 25-Hydroxy: 45.8 ng/mL (ref 30.0–100.0)

## 2022-03-10 ENCOUNTER — Encounter: Payer: Self-pay | Admitting: Family Medicine

## 2022-03-10 DIAGNOSIS — D6869 Other thrombophilia: Secondary | ICD-10-CM | POA: Insufficient documentation

## 2022-03-12 DIAGNOSIS — H353231 Exudative age-related macular degeneration, bilateral, with active choroidal neovascularization: Secondary | ICD-10-CM | POA: Diagnosis not present

## 2022-03-16 ENCOUNTER — Telehealth: Payer: Self-pay | Admitting: Family Medicine

## 2022-03-16 NOTE — Telephone Encounter (Signed)
Pt's daughter and message was left concerning pt's next Prolia injection.

## 2022-03-16 NOTE — Telephone Encounter (Signed)
Pt's daughter called me back concerning prolia. She was advised that pt's estimated cost is $317.00. pt scheduled for 03/22/2022 and medication will be ordered .

## 2022-03-22 ENCOUNTER — Other Ambulatory Visit: Payer: Medicare HMO

## 2022-03-28 ENCOUNTER — Telehealth: Payer: Self-pay | Admitting: Licensed Clinical Social Worker

## 2022-03-28 NOTE — Patient Outreach (Signed)
  Care Coordination   03/28/2022 Name: Samantha Foley MRN: [id number] DOB: Feb 13, 1930   Care Coordination Outreach Attempts:  An unsuccessful telephone outreach was attempted today to offer the patient information about available care coordination services as a benefit of their health plan.   Follow Up Plan:  Additional outreach attempts will be made to offer the patient care coordination information and services.   Encounter Outcome:  No Answer  Care Coordination Interventions Activated:  No   Care Coordination Interventions:  No, not indicated    Christa See, MSW, [email or web address] Phone [phone] 3:53 PM

## 2022-04-02 ENCOUNTER — Other Ambulatory Visit: Payer: Medicare HMO

## 2022-04-03 ENCOUNTER — Encounter: Payer: Self-pay | Admitting: Internal Medicine

## 2022-04-09 ENCOUNTER — Other Ambulatory Visit: Payer: Self-pay | Admitting: Internal Medicine

## 2022-04-09 ENCOUNTER — Encounter: Payer: Self-pay | Admitting: Family Medicine

## 2022-04-10 ENCOUNTER — Other Ambulatory Visit: Payer: Medicare HMO

## 2022-04-10 DIAGNOSIS — M810 Age-related osteoporosis without current pathological fracture: Secondary | ICD-10-CM | POA: Diagnosis not present

## 2022-04-10 MED ORDER — DENOSUMAB 60 MG/ML ~~LOC~~ SOSY
60.0000 mg | PREFILLED_SYRINGE | Freq: Once | SUBCUTANEOUS | Status: AC
  Administered 2022-04-10: 60 mg via SUBCUTANEOUS

## 2022-04-23 ENCOUNTER — Telehealth: Payer: Self-pay | Admitting: Family Medicine

## 2022-04-23 NOTE — Telephone Encounter (Signed)
Pain in right side started during the night, has some constipation Did not sleep well    asked to speak with you

## 2022-04-23 NOTE — Telephone Encounter (Signed)
Spoke with patient and pain resolved.

## 2022-04-30 DIAGNOSIS — H353231 Exudative age-related macular degeneration, bilateral, with active choroidal neovascularization: Secondary | ICD-10-CM | POA: Diagnosis not present

## 2022-05-18 ENCOUNTER — Other Ambulatory Visit: Payer: Self-pay | Admitting: Family Medicine

## 2022-05-18 DIAGNOSIS — I700 Atherosclerosis of aorta: Secondary | ICD-10-CM

## 2022-05-25 ENCOUNTER — Ambulatory Visit
Admission: RE | Admit: 2022-05-25 | Discharge: 2022-05-25 | Disposition: A | Discharge status: Home / Self Care | Payer: Medicare HMO | Source: Ambulatory Visit | Attending: Family Medicine | Admitting: Family Medicine

## 2022-05-25 DIAGNOSIS — Z1231 Encounter for screening mammogram for malignant neoplasm of breast: Secondary | ICD-10-CM

## 2022-06-19 ENCOUNTER — Other Ambulatory Visit: Payer: Self-pay

## 2022-06-19 DIAGNOSIS — I480 Paroxysmal atrial fibrillation: Secondary | ICD-10-CM

## 2022-06-19 MED ORDER — APIXABAN 5 MG PO TABS: 5.0000 mg | ORAL_TABLET | Freq: Two times a day (BID) | ORAL | 1 refills | Status: DC

## 2022-06-19 NOTE — Telephone Encounter (Signed)
Prescription refill request for Eliquis received. Indication:afib Last office visit:5/23 Scr:0.7 Age: 88 Weight:73.4  kg  Prescription refilled

## 2022-06-29 DIAGNOSIS — D3132 Benign neoplasm of left choroid: Secondary | ICD-10-CM | POA: Diagnosis not present

## 2022-06-29 DIAGNOSIS — H353231 Exudative age-related macular degeneration, bilateral, with active choroidal neovascularization: Secondary | ICD-10-CM | POA: Diagnosis not present

## 2022-06-29 DIAGNOSIS — Z961 Presence of intraocular lens: Secondary | ICD-10-CM | POA: Diagnosis not present

## 2022-06-29 DIAGNOSIS — H353122 Nonexudative age-related macular degeneration, left eye, intermediate dry stage: Secondary | ICD-10-CM | POA: Diagnosis not present

## 2022-07-10 ENCOUNTER — Other Ambulatory Visit: Payer: Self-pay | Admitting: Internal Medicine

## 2022-08-02 ENCOUNTER — Encounter (HOSPITAL_COMMUNITY): Payer: Self-pay | Admitting: *Deleted

## 2022-08-20 DIAGNOSIS — H353231 Exudative age-related macular degeneration, bilateral, with active choroidal neovascularization: Secondary | ICD-10-CM | POA: Diagnosis not present

## 2022-09-04 ENCOUNTER — Encounter: Payer: Self-pay | Admitting: Family Medicine

## 2022-09-18 ENCOUNTER — Ambulatory Visit (INDEPENDENT_AMBULATORY_CARE_PROVIDER_SITE_OTHER): Payer: Medicare HMO | Admitting: Nurse Practitioner

## 2022-09-18 ENCOUNTER — Encounter: Payer: Self-pay | Admitting: Nurse Practitioner

## 2022-09-18 ENCOUNTER — Ambulatory Visit
Admission: RE | Admit: 2022-09-18 | Discharge: 2022-09-18 | Disposition: A | Discharge status: Home / Self Care | Payer: Medicare HMO | Source: Ambulatory Visit | Attending: Nurse Practitioner | Admitting: Nurse Practitioner

## 2022-09-18 VITALS — BP 130/84 | HR 66 | Wt 161.2 lb

## 2022-09-18 DIAGNOSIS — M25521 Pain in right elbow: Secondary | ICD-10-CM | POA: Diagnosis not present

## 2022-09-18 DIAGNOSIS — M19011 Primary osteoarthritis, right shoulder: Secondary | ICD-10-CM | POA: Diagnosis not present

## 2022-09-18 DIAGNOSIS — M79601 Pain in right arm: Secondary | ICD-10-CM

## 2022-09-18 DIAGNOSIS — G8929 Other chronic pain: Secondary | ICD-10-CM

## 2022-09-18 DIAGNOSIS — M7989 Other specified soft tissue disorders: Secondary | ICD-10-CM | POA: Diagnosis not present

## 2022-09-18 DIAGNOSIS — M25551 Pain in right hip: Secondary | ICD-10-CM

## 2022-09-18 MED ORDER — TRAMADOL HCL 50 MG PO TABS: 50.0000 mg | ORAL_TABLET | Freq: Three times a day (TID) | ORAL | 0 refills | Status: AC | PRN

## 2022-09-18 NOTE — Patient Instructions (Signed)
I have sent the order for x-ray to [facility]. This is located at [place]. You can walk in to have this completed. I will be in touch with you when we get the results back to discuss our next steps.   I have sent in pain medication for you to take to help with the pain. You can take this every 8 hours if you need it. You may also take tylenol to help.

## 2022-09-18 NOTE — Progress Notes (Unsigned)
  Orma Render, DNP, AGNP-c [address], [place] [address] [phone]  Subjective:   Samantha Foley is a 89 y.o. female presents to day for evaluation of: Right hand pain  [place] reports right hand pain that first started yesterday and has progressively worsened. She denies any known injury, no falls, no new medications, or recent activities. She has not had any fevers and no history of gout.  She reports pain in the palmar surface of the hand at the third PIP with radiation up into the wrist and arm following along the ulnar nerve path into the elbow and shoulder following along the superior edge of the scapula.  She is unable to grip or supinate her hand without significant pain. Extension of the elbow also causes pain. She can abduct the upper arm to approximately 80 degrees before eliciting worsening pain. Flexion and extension of the wrist is intact.   PMH, Medications, and Allergies reviewed and updated in chart as appropriate.   ROS negative except for what is listed in HPI. Objective:  BP 130/84   Pulse 66   Wt 161 lb 3.2 oz (73.1 kg)   SpO2 97%   BMI 30.46 kg/m  Physical Exam        Assessment & Plan:   Problem List Items Addressed This Visit   None     Orma Render, DNP, AGNP-c 09/18/2022  10:53 AM    History, Medications, Surgery, SDOH, and Family History reviewed and updated as appropriate.

## 2022-09-19 ENCOUNTER — Inpatient Hospital Stay: Admission: RE | Admit: 2022-09-19 | Payer: Medicare HMO | Source: Ambulatory Visit

## 2022-09-19 ENCOUNTER — Other Ambulatory Visit: Payer: Self-pay

## 2022-09-19 ENCOUNTER — Other Ambulatory Visit: Payer: Self-pay | Admitting: Family Medicine

## 2022-09-19 DIAGNOSIS — M810 Age-related osteoporosis without current pathological fracture: Secondary | ICD-10-CM

## 2022-09-19 DIAGNOSIS — M67911 Unspecified disorder of synovium and tendon, right shoulder: Secondary | ICD-10-CM

## 2022-09-19 DIAGNOSIS — M79601 Pain in right arm: Secondary | ICD-10-CM | POA: Insufficient documentation

## 2022-09-19 DIAGNOSIS — M25551 Pain in right hip: Secondary | ICD-10-CM | POA: Insufficient documentation

## 2022-09-19 DIAGNOSIS — G8929 Other chronic pain: Secondary | ICD-10-CM

## 2022-09-19 NOTE — Assessment & Plan Note (Signed)
Hip pain of unknown etiology. Does not appear to be related to the shoulder pain, but given the onset at the same time I do question whether she may have an injury that could have happened unknowingly. This does not appear to be particularly bothersome at this time and I am certainly more concerned with the shoulder. We will monitor and re-evaluate if this does not improve with rest, ice, and elevation.

## 2022-09-19 NOTE — Assessment & Plan Note (Signed)
The patient presents with acute onset of right hand pain and swelling, now involving the shoulder. No history of trauma or injury is reported, and the patient denies experiencing fever or chills. Physical examination reveals a swollen right hand with pain upon palpation extending from the palmar surface of the hand up into the arm, appearing to follow along the ulnar nerve path. Pain increases with supination of the hand and further palpation into the shoulder and along the superior border of the scapula. The patient's medical history includes arthritis and osteoporosis. I am concerned for possible entrapment of ulnar nerve and possible shoulder injury to the rotator cuff. Given exam, low concern for gout or RA.  Plan: - Order an X-ray of the hand to exclude fractures or dislocations. - - Prescribe NSAIDs for pain relief and to reduce inflammation. - Instruct the patient to rest, ice, and elevate the affected hand. - Arrange a follow-up appointment to discuss test results and monitor treatment efficacy.

## 2022-09-20 DIAGNOSIS — M542 Cervicalgia: Secondary | ICD-10-CM | POA: Diagnosis not present

## 2022-09-20 DIAGNOSIS — M25511 Pain in right shoulder: Secondary | ICD-10-CM | POA: Diagnosis not present

## 2022-10-05 DIAGNOSIS — H353231 Exudative age-related macular degeneration, bilateral, with active choroidal neovascularization: Secondary | ICD-10-CM | POA: Diagnosis not present

## 2022-11-12 ENCOUNTER — Telehealth: Payer: Self-pay | Admitting: Family Medicine

## 2022-11-12 NOTE — Telephone Encounter (Signed)
Left message for call back  pt needs to schedule prolia injection

## 2022-11-23 ENCOUNTER — Telehealth: Payer: Self-pay | Admitting: Family Medicine

## 2022-11-23 NOTE — Telephone Encounter (Signed)
After multiple attempts to reach pt concerning Prolia, pt's daughter returned my call today. She was advised that pt is overdue for Prolia. Selena Batten was advised that her estimated cost is $311. She scheduled her for 12/06/2022 stating that they have a lot going on and her sister will be in town then and can bring her. She was advised to have them call prior to leaving so medication can be set out and she verbalized understanding. Prior authorization on file and was attached to appointment. Medication was ordered from physician services.

## 2022-11-30 DIAGNOSIS — H353231 Exudative age-related macular degeneration, bilateral, with active choroidal neovascularization: Secondary | ICD-10-CM | POA: Diagnosis not present

## 2022-11-30 DIAGNOSIS — H353122 Nonexudative age-related macular degeneration, left eye, intermediate dry stage: Secondary | ICD-10-CM | POA: Diagnosis not present

## 2022-12-06 ENCOUNTER — Other Ambulatory Visit (INDEPENDENT_AMBULATORY_CARE_PROVIDER_SITE_OTHER): Payer: Medicare HMO

## 2022-12-06 DIAGNOSIS — M810 Age-related osteoporosis without current pathological fracture: Secondary | ICD-10-CM | POA: Diagnosis not present

## 2022-12-06 MED ORDER — DENOSUMAB 60 MG/ML ~~LOC~~ SOSY
60.0000 mg | PREFILLED_SYRINGE | Freq: Once | SUBCUTANEOUS | Status: AC
  Administered 2022-12-06: 60 mg via SUBCUTANEOUS

## 2022-12-16 ENCOUNTER — Encounter: Payer: Self-pay | Admitting: Family Medicine

## 2022-12-17 ENCOUNTER — Ambulatory Visit: Payer: Medicare HMO | Admitting: Family Medicine

## 2022-12-17 DIAGNOSIS — M545 Low back pain, unspecified: Secondary | ICD-10-CM | POA: Diagnosis not present

## 2022-12-30 ENCOUNTER — Encounter: Payer: Self-pay | Admitting: Internal Medicine

## 2022-12-30 DIAGNOSIS — I480 Paroxysmal atrial fibrillation: Secondary | ICD-10-CM

## 2022-12-31 ENCOUNTER — Other Ambulatory Visit: Payer: Self-pay | Admitting: *Deleted

## 2022-12-31 DIAGNOSIS — I480 Paroxysmal atrial fibrillation: Secondary | ICD-10-CM

## 2022-12-31 MED ORDER — APIXABAN 5 MG PO TABS: 5.0000 mg | ORAL_TABLET | Freq: Two times a day (BID) | ORAL | 0 refills | Status: DC

## 2022-12-31 MED ORDER — APIXABAN 5 MG PO TABS: 5.0000 mg | ORAL_TABLET | Freq: Two times a day (BID) | ORAL | 1 refills | Status: DC

## 2022-12-31 NOTE — Telephone Encounter (Signed)
Eliquis 5mg  refill request received for mail order pharmacy. Patient is 89 years old, weight-73.1kg, Crea-0.73 on 03/08/22, Diagnosis-Afib, and last seen by Dr. Graciela Husbands on 10/23/21 and pending appt on 03/28/23 with Dr. Graciela Husbands. Dose is appropriate based on dosing criteria. Will send in refill to requested pharmacy.    14 day supply sent to local pharmacy per dtr request (see mychart message) from today

## 2023-01-01 DIAGNOSIS — M4850XA Collapsed vertebra, not elsewhere classified, site unspecified, initial encounter for fracture: Secondary | ICD-10-CM | POA: Diagnosis not present

## 2023-01-01 DIAGNOSIS — M5459 Other low back pain: Secondary | ICD-10-CM | POA: Diagnosis not present

## 2023-01-04 ENCOUNTER — Other Ambulatory Visit: Payer: Self-pay | Admitting: Specialist

## 2023-01-04 DIAGNOSIS — M5459 Other low back pain: Secondary | ICD-10-CM

## 2023-01-12 ENCOUNTER — Ambulatory Visit
Admission: RE | Admit: 2023-01-12 | Discharge: 2023-01-12 | Disposition: A | Discharge status: Home / Self Care | Payer: Medicare HMO | Source: Ambulatory Visit | Attending: Specialist | Admitting: Specialist

## 2023-01-12 DIAGNOSIS — M5459 Other low back pain: Secondary | ICD-10-CM

## 2023-01-12 DIAGNOSIS — M48061 Spinal stenosis, lumbar region without neurogenic claudication: Secondary | ICD-10-CM | POA: Diagnosis not present

## 2023-01-12 DIAGNOSIS — M4856XA Collapsed vertebra, not elsewhere classified, lumbar region, initial encounter for fracture: Secondary | ICD-10-CM | POA: Diagnosis not present

## 2023-01-15 ENCOUNTER — Other Ambulatory Visit: Payer: Self-pay | Admitting: Specialist

## 2023-01-15 DIAGNOSIS — M5459 Other low back pain: Secondary | ICD-10-CM

## 2023-01-17 ENCOUNTER — Ambulatory Visit
Admission: RE | Admit: 2023-01-17 | Discharge: 2023-01-17 | Disposition: A | Discharge status: Home / Self Care | Payer: Medicare HMO | Source: Ambulatory Visit | Attending: Specialist | Admitting: Specialist

## 2023-01-17 ENCOUNTER — Telehealth: Payer: Self-pay

## 2023-01-17 VITALS — BP 153/63 | HR 62 | Temp 98.1°F | Resp 15

## 2023-01-17 DIAGNOSIS — M8008XA Age-related osteoporosis with current pathological fracture, vertebra(e), initial encounter for fracture: Secondary | ICD-10-CM | POA: Diagnosis not present

## 2023-01-17 DIAGNOSIS — S32040A Wedge compression fracture of fourth lumbar vertebra, initial encounter for closed fracture: Secondary | ICD-10-CM | POA: Diagnosis not present

## 2023-01-17 DIAGNOSIS — M5459 Other low back pain: Secondary | ICD-10-CM

## 2023-01-17 DIAGNOSIS — S32040G Wedge compression fracture of fourth lumbar vertebra, subsequent encounter for fracture with delayed healing: Secondary | ICD-10-CM

## 2023-01-17 HISTORY — PX: IR RADIOLOGIST EVAL & MGMT: IMG5224

## 2023-01-17 NOTE — H&P (Addendum)
Interventional Radiology - Clinic Visit, Initial H&P    Referring Provider: Jene Every, MD  Reason for Visit: L4 compression fracture     History of Present Illness  Samantha Foley is a 89 y.o. female with a relevant past medical history of osteoporosis (DEXA 2021, on Prolia injections every 6 months) and previous vertebroplasty at L1 (2010) and L3 (2017) seen today in Interventional Radiology clinic for new L4 compression fracture.  The patient reports gradual onset of lower back pain over the last 1-2 months.  The pain is constant in her lower back, which she rates at 10/10 at baseline.  She does not recall any inciting injury.  The back pain is particularly bothersome when trying to bend over, such as loading the dishwasher.  She ambulatory at baseline with a walker for assistance.  She has been prescribed hydrocodone for her back pain, which she takes at least twice daily with mild improvement in her back pain to 6/10.  She rates moderate to severe disability on the [employer] disability questionnaire with 15/24 positive (* some questions not applicable due to the fact that she normally does not use the stairs).    Past medical history is significant for atrial fibrillation on Eliquis.  She has had prior vertebral plasty with good relief of her back pain in both 2010 and 2017 (L1, L3 respectively).    Additional Past Medical History Past Medical History:  Diagnosis Date   Arthritis    Atherosclerosis of aorta (HCC)    noted on chest CT 10/2015   Atrial fibrillation (HCC)    reported but not yet documented   Baker cyst, left 06/2017   noted to have large, complex cyst on [place]   Chronic back pain    Dr. Shelle Iron   Chronic knee pain    Dr. Lequita Halt   Closed compression fracture of body of L1 vertebra (HCC) 05/2009   vertebroplasty 06/02/2009   Closed compression fracture of L3 lumbar vertebra, initial encounter (HCC) 05/2015   vertebroplasty 07/01/2015   Colon polyp    Dr. Laural Benes    Coronary atherosclerosis    noted on chest CT 10/2015   Dysrhythmia    atrial fib- mild per pt- no meds   Food allergy    Scallops--no other seafood allergies   GERD (gastroesophageal reflux disease)    Granuloma annulare 09/2016   Dr. Hortense Ramal   Hiatal hernia    large, noted on EGD 01/14/13   Hyperlipidemia    Impaired glucose tolerance    Knee hemarthrosis, left 03/2017   Lactose intolerance    Macular degeneration    bilateral   Osteopenia    Osteoporosis    treated with Forteo in 2011 by Dr. 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 Shelle Iron, declines sx)   Spinal stenosis    has had injections   Vision problem    wears contacts     Surgical History  Past Surgical History:  Procedure Laterality Date   BACK SURGERY     several vertbea ae rcemntekd   CHOLECYSTECTOMY N/A 11/05/2018   Procedure: LAPAROSCOPIC CHOLECYSTECTOMY;  Surgeon: Berna Bue, MD;  Location: [facility];  Service: General;  Laterality: N/A;   ROBOTIC ASSISTED TOTAL HYSTERECTOMY WITH BILATERAL SALPINGO OOPHERECTOMY Bilateral 03/08/2016   Procedure: ROBOTIC ASSISTED TOTAL HYSTERECTOMY WITH BILATERAL SALPINGO OOPHORECTOMY/Uterosacral Ligament Suspension;  Surgeon: Genia Del, MD;  Location: [facility];  Service: Gynecology;  Laterality: Bilateral;   sebaceous cyst excision  2009   neck   TONSILLECTOMY AND ADENOIDECTOMY  age 54   VERTEBROPLASTY  05/2009   L1   VERTEBROPLASTY  07/01/2015   L3   WISDOM TOOTH EXTRACTION       Medications  I have reviewed the current medication list. Refer to chart for details. Current Outpatient Medications  Medication Instructions   acetaminophen (TYLENOL) 1,000 mg, Oral, Every 6 hours PRN   apixaban (ELIQUIS) 5 mg, Oral, 2 times daily   flecainide (TAMBOCOR) 50 MG tablet TAKE 1 TABLET TWICE A DAY   fluticasone  (FLONASE) 50 MCG/ACT nasal spray 1 spray, Each Nare, Daily PRN   guaiFENesin (MUCINEX) 600 mg, Oral, 2 times daily   loratadine (CLARITIN) 10 mg, Daily   metoprolol tartrate (LOPRESSOR) 25 mg, Oral, 2 times daily   Multiple Vitamin (MULTIVITAMIN WITH MINERALS) TABS tablet 1 tablet, Oral, Daily   Multiple Vitamins-Minerals (ICAPS AREDS 2 PO) 1 capsule, Oral, 2 times daily   omeprazole (PRILOSEC) 20 mg, Oral, Daily   rosuvastatin (CRESTOR) 10 MG tablet TAKE 1 TABLET DAILY   vitamin B-12 (CYANOCOBALAMIN) 1000 MCG tablet 1 tablet, Oral, Daily      Allergies Allergies  Allergen Reactions   Ciprofloxacin Shortness Of Breath and Rash   Scallops [Shellfish Allergy] Anaphylaxis   Demerol Other (See Comments)    Patient states it made her crazy and sleep for 12 hours   Doxycycline Nausea And Vomiting   Milk-Related Compounds Diarrhea   Penicillins Other (See Comments)    Did it involve swelling of the face/tongue/throat, SOB, or low BP? Unknown Did it involve sudden or severe rash/hives, skin peeling, or any reaction on the inside of your mouth or nose? Unknown Did you need to seek medical attention at a hospital or doctor's office? Unknown When did it last happen?       If all above answers are "NO", may proceed with cephalosporin use.    Rivaroxaban Other (See Comments)    Pt unsure but d/c use   Sudafed [Pseudoephedrine Hcl] Other (See Comments)    hyperactive   Does patient have contrast allergy: No     Physical Exam Current Vitals Temp: 98.1 F (36.7 C) (Temp Source: Oral)  Pulse Rate: 62  Resp: 15  BP: (!) 153/63  SpO2: 94 %        There is no height or weight on file to calculate BMI.  General: Alert and answers questions appropriately.  HEENT: Normocephalic, atraumatic. Conjunctivae normal without scleral icterus. Cardiac: Regular rate. No dependent edema. Pulmonary: Normal work of breathing. On room air. Back: TTP in lower back.    Pertinent Lab Results     Latest Ref Rng & Units 03/08/2022    2:48 PM 10/23/2021    4:30 PM 01/12/2021    3:06 PM  CBC  WBC 3.4 - 10.8 x10E3/uL 7.3  10.1  10.0   Hemoglobin 11.1 - 15.9 g/dL 08.6  57.8  46.9   Hematocrit 34.0 - 46.6 % 37.5  37.6  38.9   Platelets 150 - 450 x10E3/uL 196  196  214       Latest Ref Rng & Units 03/08/2022    2:48 PM 10/23/2021    4:30 PM 01/12/2021    3:06 PM  CMP  Glucose 70 - 99 mg/dL 86  99  89   BUN 8 - 27 mg/dL 14  11  11   Creatinine 0.57 - 1.00 mg/dL 1.61  0.96  0.45   Sodium 134 - 144 mmol/L 141  144  144   Potassium 3.5 - 5.2 mmol/L 4.0  4.2  3.9   Chloride 96 - 106 mmol/L 102  105  103   CO2 20 - 29 mmol/L 24  27  25    Calcium 8.7 - 10.3 mg/dL 9.6  9.1  9.3   Total Protein 6.0 - 8.5 g/dL 6.5   6.5   Total Bilirubin 0.0 - 1.2 mg/dL 0.3   0.4   Alkaline Phos 44 - 121 IU/L 92   72   AST 0 - 40 IU/L 19   22   ALT 0 - 32 IU/L 16   18       Relevant and/or Recent Imaging: MRI L spine 01/12/2023  IMPRESSION: 1. Acute or subacute L4 compression fracture with 15% height loss. 2. Progressive, advanced lumbar disc and facet degeneration with severe spinal stenosis at L3-4 and L4-5 and moderate spinal stenosis at L2-3. 3. Severe left neural foraminal stenosis at L4-5. 4. Moderate neural foraminal stenosis on the right at L3-4 and bilaterally at L5-S1. Electronically Signed By: Sebastian Ache M.D. On: 01/12/2023 13:43     Assessment & Plan:   Patient has suffered subacute osteoporotic fracture of the L4 vertebra. No known trauma leading to injury. Back pain has resulted in significant reduction in quality of life.    History and exam have demonstrated the following:  Acute/Subacute fracture by imaging dated 01/12/2023, Pain on exam concordant with level of fracture, Failure of conservative therapy and pain refractory to narcotic pain mediation, and Significant disability on the [employer] Disability Questionnaire with 15/24 positive symptoms, reflecting significant  impact/impairment of (ADLs)   ICD-10-CM Codes that Support Medical Necessity (WelshBlog.at.[email or web address]?[email or web address])  M80.08XA    Age-related osteoporosis with current pathological fracture, vertebra(e), initial encounter for fracture  S32.040A    Wedge compression fracture of fourth lumbar vertebra, initial encounter for closed fracture    Plan:  L4 vertebral body augmentation with balloon kyphoplasty  Post-procedure disposition: outpatient  Medication holds: Eliquis TBD  The patient has suffered a fracture of the L4 vertebral body. It is recommended that patients aged 29 years or older be evaluated for possible testing or treatment of osteoporosis. A copy of this consult report is sent to the patient's referring physician.  Advanced Care Plan: The patient did not want to provide an Advanced Care Plan at the time of this visit     Total time spent on today's visit was over 40 Minutes, including both face-to-face time and non face-to-face time, personally spent on review of chart (including labs and relevant imaging), discussing further workup and treatment options, referral to specialist if needed, reviewing outside records if pertinent, answering patient questions, and coordinating care regarding L4 fracture as well as management strategy.        Olive Bass, MD  Vascular and Interventional Radiology 01/17/2023 4:33 PM      --------------------------------------------------------------------   Addendum 01/25/2023   I have personally re-measured the height loss at the L4 vertebral body on the MRI dated 0720/2024. This MRI demonstrates positive STIR signal consistent with acute/subacute fracture at this level. Using series 3 image 18, there is 25% height loss (anterior vertebral body height measured at 21 mm, posterior vertebral body height measured at 28 mm).     Olive Bass, MD  Vascular and Interventional Radiology 01/25/2023 2:38  PM       --------------------------------------------------------------------  Addendum 02/14/2023  I spoke to the patient's PCP, Dr. Joselyn Arrow, who provided the following information regarding her low bone density treatment history:   "She was started on Prolia after her last DEXA in 2021, and has been getting injections every 6 months. Last injection was in June of 2024. "    Olive Bass, MD  Vascular and Interventional Radiology 02/14/2023 11:41 AM       --------------------------------------------------------------------   Addendum 02/14/2023  I spoke again to Dr. Shelle Iron, referring orthopedic surgeon. He is "sending over a letter stating that she is not a candidate for [physical] therapy." He explains that physical therapy is contraindicated in the setting of an acute/subacute fracture.    Olive Bass, MD  Vascular and Interventional Radiology 02/14/2023 4:13 PM

## 2023-01-17 NOTE — Telephone Encounter (Signed)
Patient with diagnosis of afib on Eliquis for anticoagulation.    Procedure: kyphoplasty Date of procedure: TBD   CHA2DS2-VASc Score = 3   This indicates a 3.2% annual risk of stroke. The patient's score is based upon: CHF History: 0 HTN History: 0 Diabetes History: 0 Stroke History: 0 Vascular Disease History: 0 Age Score: 2 Gender Score: 1      CrCl 46 ml/min  Per office protocol, patient can hold Eliquis for 3 days prior to procedure.    **This guidance is not considered finalized until pre-operative APP has relayed final recommendations.**

## 2023-01-17 NOTE — Telephone Encounter (Signed)
Pharmacy please advise on holding Eliquis  prior to Kyphoplasty scheduled for TBD. Thank you.

## 2023-01-17 NOTE — Telephone Encounter (Signed)
   Pre-operative Risk Assessment    Patient Name: Samantha Foley  DOB: 02-25-1930 MRN: [id number]      Request for Surgical Clearance    Procedure:   Kyphoplasty  Date of Surgery:  Clearance TBD                                 Surgeon:  Not Listed Surgeon's Group or Practice Name:  [facility] Phone number:  [phone] Fax number:  [phone]   Type of Clearance Requested:   - Medical  - Pharmacy:  Hold Apixaban (Eliquis) x 6 doses   Type of Anesthesia:  Not Indicated   Additional requests/questions:    Garrel Ridgel   01/17/2023, 11:11 AM

## 2023-01-17 NOTE — Telephone Encounter (Signed)
Primary Cardiologist:None  Chart reviewed as part of pre-operative protocol coverage. Because of Samantha Foley past medical history and time since last visit, he/she will require a follow-up visit in order to better assess preoperative cardiovascular risk.  Pre-op covering staff: - Please schedule appointment and call patient to inform them. - Please contact requesting surgeon's office via preferred method (i.e, phone, fax) to inform them of need for appointment prior to surgery.  Per office protocol, patient can hold Eliquis for 3 days prior to procedure.   Levi Aland, NP-C  01/17/2023, 1:41 PM [address] Office [phone] Fax [phone]

## 2023-01-17 NOTE — Telephone Encounter (Signed)
Patient has now been schedule for IN OFFICE pre-op clearance. Pt agreed to appt date, time and location.

## 2023-01-18 ENCOUNTER — Telehealth: Payer: Self-pay | Admitting: Internal Medicine

## 2023-01-18 DIAGNOSIS — H353231 Exudative age-related macular degeneration, bilateral, with active choroidal neovascularization: Secondary | ICD-10-CM | POA: Diagnosis not present

## 2023-01-18 NOTE — Telephone Encounter (Signed)
Spoke with pt's daughter,DPR who requests pre-op appointment be moved to an earlier date as radiology is wanting to schedule pt's procedure for next week.  Pt has been scheduled for pre-op appointment on 02/04/2023.  Pt's daughter advised will forward to our pre-op team but as for right now this is the earliest appointment available.  Pt's daughter verbalizes understanding and agrees with current plan.

## 2023-01-18 NOTE — Telephone Encounter (Signed)
Patient's daughter called to speak with Dr. Graciela Husbands or nurse.

## 2023-01-18 NOTE — Telephone Encounter (Signed)
I left a vm letting daughter know that pts current appt is the earliest appt available.

## 2023-01-24 NOTE — Progress Notes (Signed)
Cardiology Clinic Note   Patient Name: Samantha Foley Date of Encounter: 01/25/2023  Primary Care Provider:  Joselyn Arrow, MD Primary Cardiologist:  Sherryl Manges, MD  Patient Profile    89 year old female with paroxysmal atrial fibrillation on apixaban 5 mg twice daily, atherosclerosis of the aorta, chronic back pain, with history of closed compression fracture of body of L1 vertebrae.  Last seen in the office by Dr. Sherryl Manges on 10/23/2020 who manages atrial fibrillation. She has CHADS VASC Score of 3.   Past Medical History    Past Medical History:  Diagnosis Date   Arthritis    Atherosclerosis of aorta (HCC)    noted on chest CT 10/2015   Atrial fibrillation (HCC)    reported but not yet documented   Baker cyst, left 06/2017   noted to have large, complex cyst on [place]   Chronic back pain    Dr. Shelle Iron   Chronic knee pain    Dr. Lequita Halt   Closed compression fracture of body of L1 vertebra (HCC) 05/2009   vertebroplasty 06/02/2009   Closed compression fracture of L3 lumbar vertebra, initial encounter (HCC) 05/2015   vertebroplasty 07/01/2015   Colon polyp    Dr. Laural Benes   Coronary atherosclerosis    noted on chest CT 10/2015   Dysrhythmia    atrial fib- mild per pt- no meds   Food allergy    Scallops--no other seafood allergies   GERD (gastroesophageal reflux disease)    Granuloma annulare 09/2016   Dr. Hortense Ramal   Hiatal hernia    large, noted on EGD 01/14/13   Hyperlipidemia    Impaired glucose tolerance    Knee hemarthrosis, left 03/2017   Lactose intolerance    Macular degeneration    bilateral   Osteopenia    Osteoporosis    treated with Forteo in 2011 by Dr. 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 Shelle Iron, declines sx)   Spinal stenosis    has had injections   Vision problem    wears contacts   Past Surgical  History:  Procedure Laterality Date   BACK SURGERY     several vertbea ae rcemntekd   CHOLECYSTECTOMY N/A 11/05/2018   Procedure: LAPAROSCOPIC CHOLECYSTECTOMY;  Surgeon: Berna Bue, MD;  Location: [facility];  Service: General;  Laterality: N/A;   IR RADIOLOGIST EVAL & MGMT  01/17/2023   ROBOTIC ASSISTED TOTAL HYSTERECTOMY WITH BILATERAL SALPINGO OOPHERECTOMY Bilateral 03/08/2016   Procedure: ROBOTIC ASSISTED TOTAL HYSTERECTOMY WITH BILATERAL SALPINGO OOPHORECTOMY/Uterosacral Ligament Suspension;  Surgeon: Genia Del, MD;  Location: [facility];  Service: Gynecology;  Laterality: Bilateral;   sebaceous cyst excision  2009   neck   TONSILLECTOMY AND ADENOIDECTOMY  age 71   VERTEBROPLASTY  05/2009   L1   VERTEBROPLASTY  07/01/2015   L3   WISDOM TOOTH EXTRACTION      Allergies  Allergies  Allergen Reactions   Ciprofloxacin Shortness Of Breath and Rash   Scallops [Shellfish Allergy] Anaphylaxis   Demerol Other (See Comments)    Patient states it made her crazy and sleep for 12 hours   Doxycycline Nausea And Vomiting   Milk-Related Compounds Diarrhea   Penicillins Other (See Comments)    Did it involve swelling of the face/tongue/throat, SOB, or low BP? Unknown Did it involve sudden or severe rash/hives, skin peeling, or any reaction  on the inside of your mouth or nose? Unknown Did you need to seek medical attention at a hospital or doctor's office? Unknown When did it last happen?       If all above answers are "NO", may proceed with cephalosporin use.    Rivaroxaban Other (See Comments)    Pt unsure but d/c use   Sudafed [Pseudoephedrine Hcl] Other (See Comments)    hyperactive    History of Present Illness    Samantha Foley is a very pleasant lady who comes today for preoperative cardiac evaluation in order to have kyphoplasty with [facility] imaging by Dr. Eli Hose date to be determined.  Pharmacy has waiting concerning Eliquis being held prior to cardiac evaluation and  planned surgery.  Per office protocol the patient's Eliquis could be held for 5 days prior to procedure.  Due to length of time between appointments she is here in person for further evaluation prior to being cleared.  She is doing very well, denies any melena, hemoptysis, racing heart rate, palpitations, dizziness, fatigue, chest pain or shortness of breath.  She is limited on her activities due to low back pain.  Uses a walker for ambulation.  She complains all of her ADLs, she lives alone and can do some light housework while waiting on back surgery.  She has good family support.  Her only complaint is some constipation related to pain management meds.   Home Medications    Current Outpatient Medications  Medication Sig Dispense Refill   acetaminophen (TYLENOL) 500 MG tablet Take 1,000 mg by mouth every 6 (six) hours as needed.     apixaban (ELIQUIS) 5 MG TABS tablet Take 1 tablet (5 mg total) by mouth 2 (two) times daily. 180 tablet 1   flecainide (TAMBOCOR) 50 MG tablet TAKE 1 TABLET TWICE A DAY 90 tablet 2   fluticasone (FLONASE) 50 MCG/ACT nasal spray Place 1 spray into both nostrils daily as needed for allergies or rhinitis.     guaiFENesin (MUCINEX) 600 MG 12 hr tablet Take 600 mg by mouth 2 (two) times daily.     loratadine (CLARITIN) 10 MG tablet Take 10 mg by mouth daily.     metoprolol tartrate (LOPRESSOR) 25 MG tablet TAKE 1 TABLET BY MOUTH 2 TIMES DAILY. 180 tablet 1   Multiple Vitamin (MULTIVITAMIN WITH MINERALS) TABS tablet Take 1 tablet by mouth daily.     Multiple Vitamins-Minerals (ICAPS AREDS 2 PO) Take 1 capsule by mouth 2 (two) times a day.     omeprazole (PRILOSEC) 20 MG capsule Take 1 capsule (20 mg total) by mouth daily. 90 capsule 3   rosuvastatin (CRESTOR) 10 MG tablet TAKE 1 TABLET DAILY 90 tablet 2   vitamin B-12 (CYANOCOBALAMIN) 1000 MCG tablet Take 1 tablet by mouth daily.     No current facility-administered medications for this visit.     Family History     Family History  Problem Relation Age of Onset   Rheumatic fever Mother    Diabetes Mother    Heart disease Mother        rheumatic heart disease   Cancer Sister 60       breast cancer   Cancer Brother        bladder and prostate cancer   Migraines Daughter    Cancer Sister        leukemia at 49; colon cancer in her late 30's--family ?'s dx   She indicated that her mother is deceased. She indicated that her  father is deceased. She indicated that both of her sisters are deceased. She indicated that her brother is deceased. She indicated that her maternal grandmother is deceased. She indicated that her maternal grandfather is deceased. She indicated that her paternal grandmother is deceased. She indicated that her paternal grandfather is deceased. She indicated that both of her daughters are alive.  Social History    Social History   Socioeconomic History   Marital status: Married    Spouse name: Not on file   Number of children: 2   Years of education: Not on file   Highest education level: Not on file  Occupational History   Occupation: retired (ran [employer] with her husband)  Tobacco Use   Smoking status: Never   Smokeless tobacco: Never  Vaping Use   Vaping status: Never Used  Substance and Sexual Activity   Alcohol use: No   Drug use: No   Sexual activity: Not Currently  Other Topics Concern   Not on file  Social History Narrative   Widowed, lives alone.  No pets.  Daughter in [place], [place]; Daughter in [place].       Updated 02/2022   Social Determinants of Health   Financial Resource Strain: Low Risk  (05/04/2021)   Overall Financial Resource Strain (CARDIA)    Difficulty of Paying Living Expenses: Not very hard  Food Insecurity: Not on file  Transportation Needs: No Transportation Needs (05/04/2021)   PRAPARE - Administrator, Civil Service (Medical): No    Lack of Transportation (Non-Medical): No  Physical Activity: Not on file  Stress: Not on  file  Social Connections: Not on file  Intimate Partner Violence: Not on file     Review of Systems    General:  No chills, fever, night sweats or weight changes.  Cardiovascular:  No chest pain, dyspnea on exertion, edema, orthopnea, palpitations, paroxysmal nocturnal dyspnea. Dermatological: No rash, lesions/masses Respiratory: No cough, dyspnea Urologic: No hematuria, dysuria Abdominal:   No nausea, vomiting, diarrhea, bright red blood per rectum, melena, or hematemesis Neurologic:  No visual changes, wkns, changes in mental status. All other systems reviewed and are otherwise negative except as noted above.       Physical Exam    VS:  Ht 5' (1.524 m)   Wt 156 lb (70.8 kg)   SpO2 96%   BMI 30.47 kg/m  , BMI Body mass index is 30.47 kg/m.     GEN: Well nourished, well developed, in no acute distress. HEENT: normal. Neck: Supple, no JVD, carotid bruits, or masses. Cardiac: RRR, no murmurs, rubs, or gallops. No clubbing, cyanosis, edema.  Radials/DP/PT 2+ and equal bilaterally.  Respiratory:  Respirations regular and unlabored, clear to auscultation bilaterally. GI: Soft, nontender, nondistended, BS + x 4. MS: no deformity or atrophy. Skin: warm and dry, no rash. Neuro:  Strength and sensation are intact.  Using walker for ambulation currently. Psych: Normal affect.      Lab Results  Component Value Date   WBC 7.3 03/08/2022   HGB 12.5 03/08/2022   HCT 37.5 03/08/2022   MCV 88 03/08/2022   PLT 196 03/08/2022   Lab Results  Component Value Date   CREATININE 0.73 03/08/2022   BUN 14 03/08/2022   NA 141 03/08/2022   K 4.0 03/08/2022   CL 102 03/08/2022   CO2 24 03/08/2022   Lab Results  Component Value Date   ALT 16 03/08/2022   AST 19 03/08/2022   ALKPHOS 92  03/08/2022   BILITOT 0.3 03/08/2022   Lab Results  Component Value Date   CHOL 146 03/08/2022   HDL 57 03/08/2022   LDLCALC 71 03/08/2022   TRIG 101 03/08/2022   CHOLHDL 2.6 03/08/2022    Lab  Results  Component Value Date   HGBA1C 5.7 (H) 08/15/2011   EKG: Normal sinus rhythm with right bundle branch block, nonspecific T wave abnormality.  Heart rate 65 bpm.  (Personally reviewed)  Review of Prior Studies    Echocardiogram 11/03/2018  1. The left ventricle has normal systolic function with an ejection  fraction of 60-65%. Left ventricular diastolic Doppler parameters are  consistent with pseudonormalization.   2. The right ventricle has mildly reduced systolic function. The cavity  was mildly enlarged. There is no increase in right ventricular wall  thickness.   3. No evidence of mitral valve stenosis.   4. Aortic valve regurgitation is mild by color flow Doppler. No stenosis  of the aortic valve.   5. The aortic root and ascending aorta are normal in size and structure.   6. The interatrial septum was not assessed.   Assessment & Plan   1.  Pre-Operative Cardiac Clearance   RCRI 0.4-Low Risk  DASI-4. Limited activity due to back injury.      CHA2DS2-VASc Score = 3   This indicates a 3.2% annual risk of stroke. The patient's score is based upon: CHF History: 0 HTN History: 0 Diabetes History: 0 Stroke History: 0 Vascular Disease History: 0 Age Score: 2 Gender Score: 1  CrCl 46 ml/min   Per office protocol, patient can hold Eliquis for 3 days prior to procedure. Please resume Eliquis as soon as possible postprocedure, at the discretion of the surgeon.    Chart reviewed as part of pre-operative protocol coverage.   Given past medical history and time since last visit, based on ACC/AHA guidelines, ERMINA OBERMAN would be at acceptable risk for the planned procedure without further cardiovascular testing.   2. Atrial Fib: Currently in NSR with RBBB.  Heart rate is well controlled. Continue current regimen with Eliquis with the exception of above peri-operatively.   3.Constipation: Patient advised to take stool softener to avoid cramping with laxative.      Signed, Bettey Mare. Liborio Nixon, ANP, AACC   01/25/2023 8:39 AM      Office [phone] Fax [phone]  Notice: This dictation was prepared with Dragon dictation along with smaller phrase technology. Any transcriptional errors that result from this process are unintentional and may not be corrected upon review.

## 2023-01-25 ENCOUNTER — Encounter: Payer: Self-pay | Admitting: Adult Health

## 2023-01-25 ENCOUNTER — Ambulatory Visit: Payer: Medicare HMO | Admitting: Adult Health

## 2023-01-25 VITALS — BP 134/72 | HR 66 | Ht 60.0 in | Wt 156.0 lb

## 2023-01-25 DIAGNOSIS — Z01818 Encounter for other preprocedural examination: Secondary | ICD-10-CM

## 2023-01-25 DIAGNOSIS — I480 Paroxysmal atrial fibrillation: Secondary | ICD-10-CM | POA: Diagnosis not present

## 2023-01-25 NOTE — Patient Instructions (Signed)
Medication Instructions:  No Changes *If you need a refill on your cardiac medications before your next appointment, please call your pharmacy*   Lab Work: No Labs If you have labs (blood work) drawn today and your tests are completely normal, you will receive your results only by: MyChart Message (if you have MyChart) OR A paper copy in the mail If you have any lab test that is abnormal or we need to change your treatment, we will call you to review the results.   Testing/Procedures: No Testing   Follow-Up: At [facility], you and your health needs are our priority.  As part of our continuing mission to provide you with exceptional heart care, we have created designated Provider Care Teams.  These Care Teams include your primary Cardiologist (physician) and Advanced Practice Providers (APPs -  Physician Assistants and Nurse Practitioners) who all work together to provide you with the care you need, when you need it.  We recommend signing up for the patient portal called "MyChart".  Sign up information is provided on this After Visit Summary.  MyChart is used to connect with patients for Virtual Visits (Telemedicine).  Patients are able to view lab/test results, encounter notes, upcoming appointments, etc.  Non-urgent messages can be sent to your provider as well.   To learn more about what you can do with MyChart, go to [email or web address].    Your next appointment:   Keep Scheduled Appointment  Provider:    Lucile Crater PA-C Other Instructions Hold Eliquis 3 Days Prior to Procedure. Recommended Colace .

## 2023-01-31 ENCOUNTER — Encounter: Payer: Self-pay | Admitting: Family Medicine

## 2023-02-04 ENCOUNTER — Ambulatory Visit: Payer: Medicare HMO | Admitting: Physician Assistant

## 2023-02-11 ENCOUNTER — Other Ambulatory Visit: Payer: Self-pay | Admitting: Family Medicine

## 2023-02-11 ENCOUNTER — Other Ambulatory Visit: Payer: Self-pay | Admitting: Internal Medicine

## 2023-02-11 DIAGNOSIS — I700 Atherosclerosis of aorta: Secondary | ICD-10-CM

## 2023-02-11 DIAGNOSIS — K219 Gastro-esophageal reflux disease without esophagitis: Secondary | ICD-10-CM

## 2023-02-14 ENCOUNTER — Encounter: Payer: Self-pay | Admitting: Internal Medicine

## 2023-02-14 NOTE — Addendum Note (Signed)
Encounter addended by: Pernell Dupre, MD on: 02/14/2023 11:41 AM  Actions taken: Clinical Note Signed

## 2023-02-14 NOTE — Addendum Note (Signed)
Encounter addended by: Pernell Dupre, MD on: 02/14/2023 4:15 PM  Actions taken: Clinical Note Signed

## 2023-02-26 MED ORDER — FLECAINIDE ACETATE 50 MG PO TABS: ORAL_TABLET | ORAL | 1 refills | Status: DC

## 2023-02-26 NOTE — Addendum Note (Signed)
Addended by: Alois Cliche on: 02/26/2023 08:02 AM   Modules accepted: Orders

## 2023-02-27 ENCOUNTER — Other Ambulatory Visit: Payer: Self-pay | Admitting: Specialist

## 2023-02-27 DIAGNOSIS — S32040A Wedge compression fracture of fourth lumbar vertebra, initial encounter for closed fracture: Secondary | ICD-10-CM

## 2023-02-27 DIAGNOSIS — M8008XS Age-related osteoporosis with current pathological fracture, vertebra(e), sequela: Secondary | ICD-10-CM

## 2023-02-28 NOTE — Discharge Instructions (Addendum)
Kyphoplasty Post Procedure Discharge Instructions  May resume a regular diet and any medications that you routinely take (including pain medications). However, if you are taking Aspirin or an anticoagulant/blood thinner you will be told when you can resume taking these by the healthcare provider. No driving day of procedure. The day of your procedure take it easy. You may use an ice pack as needed to injection sites on back.  Ice to back 30 minutes on and 30 minutes off, as needed. May remove bandaids tomorrow after taking a shower. Replace daily with a clean bandaid until healed.  Do not lift anything heavier than a milk jug for 1-2 weeks or determined by your physician.  Follow up with your physician in 2 weeks.    Please contact our office at [phone] for the following symptoms or if you have any questions:  Fever greater than 100 degrees Increased swelling, pain, or redness at injection site. Increased back and/or leg pain New numbness or change in symptoms from before the procedure.    Thank you for visiting [facility].  May resume eliquis 24 hours after procedure.

## 2023-03-01 DIAGNOSIS — Z01818 Encounter for other preprocedural examination: Secondary | ICD-10-CM | POA: Diagnosis not present

## 2023-03-02 LAB — CBC
HCT: 37.4 % (ref 35.0–45.0)
Hemoglobin: 11.8 g/dL (ref 11.7–15.5)
MCH: 27.4 pg (ref 27.0–33.0)
MCHC: 31.6 g/dL — ABNORMAL LOW (ref 32.0–36.0)
MCV: 87 fL (ref 80.0–100.0)
MPV: 10 fL (ref 7.5–12.5)
Platelets: 196 10*3/uL (ref 140–400)
RBC: 4.3 10*6/uL (ref 3.80–5.10)
RDW: 13.7 % (ref 11.0–15.0)
WBC: 7.3 10*3/uL (ref 3.8–10.8)

## 2023-03-02 LAB — COMPLETE METABOLIC PANEL WITH GFR
AG Ratio: 1.5 (calc) (ref 1.0–2.5)
ALT: 13 U/L (ref 6–29)
AST: 18 U/L (ref 10–35)
Albumin: 3.8 g/dL (ref 3.6–5.1)
Alkaline phosphatase (APISO): 73 U/L (ref 37–153)
BUN: 15 mg/dL (ref 7–25)
CO2: 24 mmol/L (ref 20–32)
Calcium: 8.8 mg/dL (ref 8.6–10.4)
Chloride: 104 mmol/L (ref 98–110)
Creat: 0.72 mg/dL (ref 0.60–0.95)
Globulin: 2.5 g/dL (ref 1.9–3.7)
Glucose, Bld: 131 mg/dL — ABNORMAL HIGH (ref 65–99)
Potassium: 3.7 mmol/L (ref 3.5–5.3)
Sodium: 138 mmol/L (ref 135–146)
Total Bilirubin: 0.5 mg/dL (ref 0.2–1.2)
Total Protein: 6.3 g/dL (ref 6.1–8.1)
eGFR: 79 mL/min/{1.73_m2} (ref >=60)

## 2023-03-02 LAB — PROTIME-INR

## 2023-03-05 ENCOUNTER — Ambulatory Visit
Admission: RE | Admit: 2023-03-05 | Discharge: 2023-03-05 | Disposition: A | Discharge status: Home / Self Care | Payer: Medicare HMO | Source: Ambulatory Visit | Attending: Specialist | Admitting: Specialist

## 2023-03-05 VITALS — BP 148/60 | HR 65 | Temp 98.0°F | Resp 18

## 2023-03-05 DIAGNOSIS — M8008XS Age-related osteoporosis with current pathological fracture, vertebra(e), sequela: Secondary | ICD-10-CM

## 2023-03-05 DIAGNOSIS — Z01818 Encounter for other preprocedural examination: Secondary | ICD-10-CM

## 2023-03-05 DIAGNOSIS — S32040A Wedge compression fracture of fourth lumbar vertebra, initial encounter for closed fracture: Secondary | ICD-10-CM

## 2023-03-05 DIAGNOSIS — M8008XA Age-related osteoporosis with current pathological fracture, vertebra(e), initial encounter for fracture: Secondary | ICD-10-CM | POA: Diagnosis not present

## 2023-03-05 HISTORY — PX: IR KYPHO LUMBAR INC FX REDUCE BONE BX UNI/BIL CANNULATION INC/IMAGING: IMG5519

## 2023-03-05 MED ORDER — VANCOMYCIN HCL IN DEXTROSE 1-5 GM/200ML-% IV SOLN
1000.0000 mg | INTRAVENOUS | Status: AC
  Administered 2023-03-05: 1000 mg via INTRAVENOUS

## 2023-03-05 MED ORDER — MIDAZOLAM HCL 2 MG/2ML IJ SOLN: 1.0000 mg | INTRAMUSCULAR | Status: DC | PRN

## 2023-03-05 MED ORDER — MIDAZOLAM HCL 2 MG/2ML IJ SOLN
INTRAMUSCULAR | Status: DC | PRN
Start: 2023-03-05 — End: 2023-03-06
  Administered 2023-03-05 (×2): .5 mg via INTRAVENOUS
  Administered 2023-03-05: 1 mg via INTRAVENOUS

## 2023-03-05 MED ORDER — ACETAMINOPHEN 10 MG/ML IV SOLN
1000.0000 mg | Freq: Once | INTRAVENOUS | Status: AC
  Administered 2023-03-05: 1000 mg via INTRAVENOUS

## 2023-03-05 MED ORDER — SODIUM CHLORIDE 0.9 % IV SOLN
INTRAVENOUS | Status: DC
  Administered 2023-03-05: 250 mL via INTRAVENOUS

## 2023-03-05 MED ORDER — FENTANYL CITRATE PF 50 MCG/ML IJ SOSY: 25.0000 ug | PREFILLED_SYRINGE | INTRAMUSCULAR | Status: DC | PRN

## 2023-03-05 MED ORDER — FENTANYL CITRATE (PF) 100 MCG/2ML IJ SOLN
INTRAMUSCULAR | Status: DC | PRN
Start: 2023-03-05 — End: 2023-03-06
  Administered 2023-03-05 (×4): 25 ug via INTRAVENOUS

## 2023-03-05 NOTE — Progress Notes (Signed)
Pt back in nursing recovery area. Pt still drowsy from procedure but will respond when spoken to. Pt follows commands, talks in complete sentences and has no s/s of distress. Pt denies complaints at this time. Pt will remain in nursing station until discharge. Will continue to monitor and tx pt according to MD orders.

## 2023-03-11 ENCOUNTER — Ambulatory Visit: Payer: Medicare HMO | Admitting: Family Medicine

## 2023-03-13 ENCOUNTER — Telehealth: Payer: Self-pay

## 2023-03-13 ENCOUNTER — Other Ambulatory Visit: Payer: Self-pay | Admitting: Interventional Radiology

## 2023-03-13 DIAGNOSIS — S22000G Wedge compression fracture of unspecified thoracic vertebra, subsequent encounter for fracture with delayed healing: Secondary | ICD-10-CM

## 2023-03-13 NOTE — Telephone Encounter (Signed)
Phone call to pt to follow up from her kyphoplasty on 03/05/23. Pt reports her pain is completely gone post procedure but is still having "a little soreness". Pt reports she is able to move around a little better. Pt denies any signs of infection, redness at the site, draining or fever. Pt has no complaints at this time and will be scheduled for a telephone follow up with Dr. Archer Asa next week. Pt advised to call back if anything were to change or any concerns arise and we will arrange an in person appointment. Pt verbalized understanding.

## 2023-03-18 DIAGNOSIS — H353231 Exudative age-related macular degeneration, bilateral, with active choroidal neovascularization: Secondary | ICD-10-CM | POA: Diagnosis not present

## 2023-03-19 ENCOUNTER — Ambulatory Visit
Admission: RE | Admit: 2023-03-19 | Discharge: 2023-03-19 | Disposition: A | Discharge status: Home / Self Care | Payer: Medicare HMO | Source: Ambulatory Visit | Attending: Interventional Radiology | Admitting: Interventional Radiology

## 2023-03-19 DIAGNOSIS — M8008XA Age-related osteoporosis with current pathological fracture, vertebra(e), initial encounter for fracture: Secondary | ICD-10-CM | POA: Diagnosis not present

## 2023-03-19 DIAGNOSIS — S22000G Wedge compression fracture of unspecified thoracic vertebra, subsequent encounter for fracture with delayed healing: Secondary | ICD-10-CM

## 2023-03-19 HISTORY — PX: IR RADIOLOGIST EVAL & MGMT: IMG5224

## 2023-03-19 NOTE — Progress Notes (Signed)
Chief Complaint: Patient was consulted remotely today (TeleHealth) for L4 compression fracture follow-up at the request of Georgi Navarrete K.    Referring Physician(s): Ranald Alessio K  History of Present Illness: Samantha Foley is a 89 y.o. female With a history of a highly symptomatic L4 compression fracture.  She underwent cement augmentation with balloon kyphoplasty on 03/05/2023.  We spoke over the telephone today in follow-up.  Her daughter was on the phone with her.  She was pleased to report that she has completely recovered from the procedure and her fracture pain is entirely gone.  She is feeling significantly better.  She does have some mild chronic lower back pain which she says is her norm.  No lower extremity weakness, radiculopathy or other new symptoms.  Past Medical History:  Diagnosis Date   Arthritis    Atherosclerosis of aorta (HCC)    noted on chest CT 10/2015   Atrial fibrillation (HCC)    reported but not yet documented   Baker cyst, left 06/2017   noted to have large, complex cyst on [place]   Chronic back pain    Dr. Shelle Iron   Chronic knee pain    Dr. Lequita Halt   Closed compression fracture of body of L1 vertebra (HCC) 05/2009   vertebroplasty 06/02/2009   Closed compression fracture of L3 lumbar vertebra, initial encounter (HCC) 05/2015   vertebroplasty 07/01/2015   Colon polyp    Dr. Laural Benes   Coronary atherosclerosis    noted on chest CT 10/2015   Dysrhythmia    atrial fib- mild per pt- no meds   Food allergy    Scallops--no other seafood allergies   GERD (gastroesophageal reflux disease)    Granuloma annulare 09/2016   Dr. Hortense Ramal   Hiatal hernia    large, noted on EGD 01/14/13   Hyperlipidemia    Impaired glucose tolerance    Knee hemarthrosis, left 03/2017   Lactose intolerance    Macular degeneration    bilateral   Osteopenia    Osteoporosis    treated with Forteo in 2011 by Dr. 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 Shelle Iron, declines sx)   Spinal stenosis    has had injections   Vision problem    wears contacts    Past Surgical History:  Procedure Laterality Date   BACK SURGERY     several vertbea ae rcemntekd   CHOLECYSTECTOMY N/A 11/05/2018   Procedure: LAPAROSCOPIC CHOLECYSTECTOMY;  Surgeon: Berna Bue, MD;  Location: [facility];  Service: General;  Laterality: N/A;   IR KYPHO LUMBAR INC FX REDUCE BONE BX UNI/BIL CANNULATION INC/IMAGING  03/05/2023   IR RADIOLOGIST EVAL & MGMT  01/17/2023   IR RADIOLOGIST EVAL & MGMT  03/19/2023   ROBOTIC ASSISTED TOTAL HYSTERECTOMY WITH BILATERAL SALPINGO OOPHERECTOMY Bilateral 03/08/2016   Procedure: ROBOTIC ASSISTED TOTAL HYSTERECTOMY WITH BILATERAL SALPINGO OOPHORECTOMY/Uterosacral Ligament Suspension;  Surgeon: Genia Del, MD;  Location: [facility];  Service: Gynecology;  Laterality: Bilateral;   sebaceous cyst excision  2009   neck   TONSILLECTOMY AND ADENOIDECTOMY  age 89   VERTEBROPLASTY  05/2009   L1   VERTEBROPLASTY  07/01/2015   L3   WISDOM TOOTH EXTRACTION      Allergies: Ciprofloxacin, Scallops [shellfish allergy], Demerol, Doxycycline, Milk-related compounds, Penicillins, Rivaroxaban, and Sudafed [pseudoephedrine hcl]  Medications: Prior to Admission medications  Medication Sig Start Date End Date Taking? Authorizing Provider  acetaminophen (TYLENOL) 500 MG tablet Take 1,000 mg by mouth every 6 (six) hours as needed.    [provider]  apixaban (ELIQUIS) 5 MG TABS tablet Take 1 tablet (5 mg total) by mouth 2 (two) times daily. 12/31/22   Duke Salvia, MD  flecainide [facility]) 50 MG tablet TAKE 1 TABLET TWICE A DAY 02/26/23   Duke Salvia, MD  fluticasone [facility]) 50 MCG/ACT nasal spray Place 1 spray into both nostrils daily as needed for allergies or rhinitis.    [provider]   guaiFENesin (MUCINEX) 600 MG 12 hr tablet Take 600 mg by mouth 2 (two) times daily.    [provider]  loratadine (CLARITIN) 10 MG tablet Take 10 mg by mouth daily.    [provider]  metoprolol tartrate (LOPRESSOR) 25 MG tablet TAKE 1 TABLET BY MOUTH 2 TIMES DAILY. 07/10/22   Duke Salvia, MD  Multiple Vitamin (MULTIVITAMIN WITH MINERALS) TABS tablet Take 1 tablet by mouth daily.    [provider]  Multiple Vitamins-Minerals (ICAPS AREDS 2 PO) Take 1 capsule by mouth 2 (two) times a day.    [provider]  omeprazole (PRILOSEC) 20 MG capsule TAKE 1 CAPSULE DAILY 02/11/23   Joselyn Arrow, MD  rosuvastatin (CRESTOR) 10 MG tablet TAKE 1 TABLET DAILY 02/11/23   Joselyn Arrow, MD  vitamin B-12 (CYANOCOBALAMIN) 1000 MCG tablet Take 1 tablet by mouth daily.    [provider]     Family History  Problem Relation Age of Onset   Rheumatic fever Mother    Diabetes Mother    Heart disease Mother        rheumatic heart disease   Cancer Sister 64       breast cancer   Cancer Brother        bladder and prostate cancer   Migraines Daughter    Cancer Sister        leukemia at 82; colon cancer in her late 27's--family ?'s dx    Social History   Socioeconomic History   Marital status: Married    Spouse name: Not on file   Number of children: 2   Years of education: Not on file   Highest education level: Not on file  Occupational History   Occupation: retired (ran [employer] with her husband)  Tobacco Use   Smoking status: Never   Smokeless tobacco: Never  Vaping Use   Vaping status: Never Used  Substance and Sexual Activity   Alcohol use: No   Drug use: No   Sexual activity: Not Currently  Other Topics Concern   Not on file  Social History Narrative   Widowed, lives alone.  No pets.  Daughter in [place], [place]; Daughter in [place].       Updated 02/2022   Social Determinants of Health   Financial Resource Strain: Low Risk  (05/04/2021)    Overall Financial Resource Strain (CARDIA)    Difficulty of Paying Living Expenses: Not very hard  Food Insecurity: Not on file  Transportation Needs: No Transportation Needs (05/04/2021)   PRAPARE - Administrator, Civil Service (Medical): No    Lack of Transportation (Non-Medical): No  Physical Activity: Not on file  Stress: Not on file  Social Connections: Not on file    Review of Systems  Review of Systems: A 12 point ROS discussed and pertinent positives are indicated in the HPI above.  All other systems are negative.  Advance Care Plan: The advanced care plan/surrogate decision maker was discussed at the time of visit and the patient did not wish to discuss or was not able to name a surrogate decision maker or provide an advance care plan.    Physical Exam No direct physical exam was performed (except for noted visual exam findings with Video Visits).    Vital Signs: There were no vitals taken for this visit.  Imaging: IR Radiologist Eval & Mgmt  Result Date: 03/19/2023 EXAM: ESTABLISHED PATIENT OFFICE VISIT CHIEF COMPLAINT: SEE EPIC NOTE HISTORY OF PRESENT ILLNESS: SEE EPIC NOTE REVIEW OF SYSTEMS: SEE EPIC NOTE PHYSICAL EXAMINATION: SEE EPIC NOTE ASSESSMENT AND PLAN: SEE EPIC NOTE Electronically Signed   By: Malachy Moan M.D.   On: 03/19/2023 15:40   IR KYPHO LUMBAR INC FX REDUCE BONE BX UNI/BIL CANNULATION INC/IMAGING  Result Date: 03/05/2023 CLINICAL DATA:  89 year old female with subacute symptomatic osteoporotic fracture of L4. She presents for cement augmentation with balloon kyphoplasty. EXAM: FLUOROSCOPIC GUIDED KYPHOPLASTY OF THE L4 VERTEBRAL BODY COMPARISON:  MRI lumbar spine 01/12/2023 MEDICATIONS: As antibiotic prophylaxis, 1 g vancomycin was ordered pre-procedure and administered intravenously by radiology nursing within 1 hour of incision. ANESTHESIA/SEDATION: Moderate (conscious) sedation was employed during this procedure. A total of Versed 2 mg  and Fentanyl 100 mcg was administered intravenously by our Radiology nurse. Moderate Sedation Time: 22 minutes. The patient's level of consciousness and vital signs were monitored continuously by radiology nursing throughout the procedure under my direct supervision. FLUOROSCOPY TIME:  Radiation exposure index: 74.4 mGy reference air kerma COMPLICATIONS: None immediate. PROCEDURE: The procedure, risks (including but not limited to bleeding, infection, organ damage), benefits, and alternatives were explained to the patient. Questions regarding the procedure were encouraged and answered. The patient understands and consents to the procedure. The patient was placed prone on the fluoroscopic table. The skin overlying the lumbar region was then prepped and draped in the usual sterile fashion. Maximal barrier sterile technique was utilized including caps, mask, sterile gowns, sterile gloves, sterile drape, hand hygiene and skin antiseptic. Intravenous Fentanyl and Versed were administered as conscious sedation during continuous cardiorespiratory monitoring by the radiology RN. The left pedicle at L4 was then infiltrated with 1% lidocaine followed by the advancement of a Kyphon trocar needle through the left pedicle into the posterior one-third of the vertebral body. Subsequently, the osteo drill was advanced to the anterior third of the vertebral body. The osteo drill was retracted. Through the working cannula, a Kyphon inflatable bone tamp 15 x 2.5 was advanced and positioned with the distal marker approximately 5 mm from the anterior aspect of the cortex. Appropriate positioning was confirmed on the AP projection. At this time, the balloon was expanded using contrast via a Kyphon inflation syringe device via micro tubing. In similar fashion, the right L4 pedicle was infiltrated with 1% lidocaine followed by the advancement of a second Kyphon trocar needle through the right pedicle into the posterior third of the  vertebral body. Subsequently, the osteo drill was coaxially advanced to the anterior right third. The osteo drill was exchanged for a Kyphon inflatable bone tamp 15 x 2.5, advanced to the 5 mm of the anterior aspect of the cortex. The balloon was then expanded using contrast as above. Inflations were continued until there was near apposition with the superior end plate. At this time, methylmethacrylate mixture was reconstituted in the Kyphon bone mixing device system. This was then loaded into the delivery  mechanism, attached to Kyphon bone fillers. The balloons were deflated and removed followed by the instillation of methylmethacrylate mixture with excellent filling in the AP and lateral projections. No extravasation was noted in the disk spaces or posteriorly into the spinal canal. No epidural venous contamination was seen. The working cannulae and the bone filler were then retrieved and removed. Hemostasis was achieved with manual compression. The patient tolerated the procedure well without immediate postprocedural complication. IMPRESSION: 1. Technically successful L4 vertebral body augmentation using balloon kyphoplasty. 2. Per CMS PQRS reporting requirements (PQRS Measure 24): Given the patient's age of greater than 50 and the fracture site (hip, distal radius, or spine), the patient should be tested for osteoporosis using DXA, and the appropriate treatment considered based on the DXA results. Electronically Signed   By: Malachy Moan M.D.   On: 03/05/2023 14:41    Labs:  CBC: Recent Labs    03/01/23 1122  WBC 7.3  HGB 11.8  HCT 37.4  PLT 196    COAGS: Recent Labs    03/01/23 1122  INR CANCELED    BMP: Recent Labs    03/01/23 1122  NA 138  K 3.7  CL 104  CO2 24  GLUCOSE 131*  BUN 15  CALCIUM 8.8  CREATININE 0.72    LIVER FUNCTION TESTS: Recent Labs    03/01/23 1122  BILITOT 0.5  AST 18  ALT 13  PROT 6.3    TUMOR MARKERS: No results for input(s): "AFPTM",  "CEA", "CA199", "CHROMGRNA" in the last 8760 hours.  Assessment and Plan:  Extremely pleasant 89 year old 2 weeks status post L4 cement augmentation with kyphoplasty.  She is doing extremely well and her back pain has almost entirely resolved.  She has her residual chronic back pain but her fracture pain is significantly improved.  We discussed optimal strategies to avoid recurrent fracture including avoiding bending and twisting and bending motions.  She and her daughter understand.  I was able to answer all of their questions.  1.)  No further scheduled follow-up.  She knows we are available should she ever suffer another fracture in the future.   Electronically Signed: Sterling Big 03/19/2023, 3:52 PM   I spent a total of  15 Minutes in remote  clinical consultation, greater than 50% of which was counseling/coordinating care for L4 compression fracture.    Visit type: Audio only (telephone). Audio (no video) only due to patient preference. Alternative for in-person consultation at [facility], [address] [place], [place], [place]. This visit type was conducted due to national recommendations for restrictions regarding the COVID-19 Pandemic (e.g. social distancing).  This format is felt to be most appropriate for this patient at this time.  All issues noted in this document were discussed and addressed.

## 2023-03-21 DIAGNOSIS — Z4689 Encounter for fitting and adjustment of other specified devices: Secondary | ICD-10-CM | POA: Diagnosis not present

## 2023-03-21 DIAGNOSIS — N819 Female genital prolapse, unspecified: Secondary | ICD-10-CM | POA: Diagnosis not present

## 2023-03-27 ENCOUNTER — Inpatient Hospital Stay
Admission: RE | Admit: 2023-03-27 | Discharge: 2023-03-27 | Disposition: A | Discharge status: Home / Self Care | Payer: Medicare HMO | Source: Ambulatory Visit | Attending: Family Medicine | Admitting: Family Medicine

## 2023-03-27 ENCOUNTER — Encounter: Payer: Self-pay | Admitting: Family Medicine

## 2023-03-27 DIAGNOSIS — R29890 Loss of height: Secondary | ICD-10-CM | POA: Diagnosis not present

## 2023-03-27 DIAGNOSIS — M8588 Other specified disorders of bone density and structure, other site: Secondary | ICD-10-CM | POA: Diagnosis not present

## 2023-03-27 DIAGNOSIS — M810 Age-related osteoporosis without current pathological fracture: Secondary | ICD-10-CM

## 2023-03-27 DIAGNOSIS — N958 Other specified menopausal and perimenopausal disorders: Secondary | ICD-10-CM | POA: Diagnosis not present

## 2023-03-27 DIAGNOSIS — Z90722 Acquired absence of ovaries, bilateral: Secondary | ICD-10-CM | POA: Diagnosis not present

## 2023-03-27 DIAGNOSIS — E349 Endocrine disorder, unspecified: Secondary | ICD-10-CM | POA: Diagnosis not present

## 2023-03-28 ENCOUNTER — Ambulatory Visit: Payer: Medicare HMO | Admitting: Internal Medicine

## 2023-04-01 ENCOUNTER — Other Ambulatory Visit: Payer: Self-pay | Admitting: *Deleted

## 2023-04-01 DIAGNOSIS — M810 Age-related osteoporosis without current pathological fracture: Secondary | ICD-10-CM

## 2023-04-22 ENCOUNTER — Encounter: Payer: Self-pay | Admitting: Internal Medicine

## 2023-04-23 ENCOUNTER — Other Ambulatory Visit: Payer: Self-pay

## 2023-04-23 MED ORDER — METOPROLOL TARTRATE 25 MG PO TABS: 25.0000 mg | ORAL_TABLET | Freq: Two times a day (BID) | ORAL | 0 refills | Status: DC

## 2023-04-26 ENCOUNTER — Telehealth: Payer: Self-pay | Admitting: Internal Medicine

## 2023-04-26 ENCOUNTER — Other Ambulatory Visit: Payer: Self-pay | Admitting: Internal Medicine

## 2023-04-26 ENCOUNTER — Ambulatory Visit: Payer: Medicare HMO | Admitting: Internal Medicine

## 2023-04-26 DIAGNOSIS — H353231 Exudative age-related macular degeneration, bilateral, with active choroidal neovascularization: Secondary | ICD-10-CM | POA: Diagnosis not present

## 2023-04-26 NOTE — Telephone Encounter (Signed)
Dr. Lynelle Doctor, Just FYI   Due for prolia in December. However her recent bone density scan in October shows worsening bone loss. Been referred to Endo for further evaluate.   I will put her chart in archive and not order it, unless we take it back over. FYI

## 2023-05-06 ENCOUNTER — Other Ambulatory Visit: Payer: Self-pay | Admitting: Family Medicine

## 2023-05-06 DIAGNOSIS — I700 Atherosclerosis of aorta: Secondary | ICD-10-CM

## 2023-05-13 DIAGNOSIS — M1990 Unspecified osteoarthritis, unspecified site: Secondary | ICD-10-CM | POA: Diagnosis not present

## 2023-05-13 DIAGNOSIS — M810 Age-related osteoporosis without current pathological fracture: Secondary | ICD-10-CM | POA: Diagnosis not present

## 2023-05-13 DIAGNOSIS — Z8781 Personal history of (healed) traumatic fracture: Secondary | ICD-10-CM | POA: Diagnosis not present

## 2023-05-20 ENCOUNTER — Other Ambulatory Visit: Payer: Self-pay | Admitting: Family Medicine

## 2023-05-20 DIAGNOSIS — K219 Gastro-esophageal reflux disease without esophagitis: Secondary | ICD-10-CM

## 2023-06-03 ENCOUNTER — Encounter: Payer: Self-pay | Admitting: Family Medicine

## 2023-06-07 ENCOUNTER — Other Ambulatory Visit: Payer: Self-pay | Admitting: Internal Medicine

## 2023-06-07 DIAGNOSIS — I480 Paroxysmal atrial fibrillation: Secondary | ICD-10-CM

## 2023-06-07 NOTE — Telephone Encounter (Signed)
Prescription refill request for Eliquis received. Indication:afib Last office visit:8/24 Scr:0.72  9/24 Age: 89 Weight:70.8  kg  Prescription refilled

## 2023-06-14 DIAGNOSIS — H353231 Exudative age-related macular degeneration, bilateral, with active choroidal neovascularization: Secondary | ICD-10-CM | POA: Diagnosis not present

## 2023-06-14 DIAGNOSIS — H353122 Nonexudative age-related macular degeneration, left eye, intermediate dry stage: Secondary | ICD-10-CM | POA: Diagnosis not present

## 2023-07-12 ENCOUNTER — Emergency Department (HOSPITAL_BASED_OUTPATIENT_CLINIC_OR_DEPARTMENT_OTHER): Payer: HMO

## 2023-07-12 ENCOUNTER — Telehealth: Payer: Self-pay | Admitting: Family Medicine

## 2023-07-12 ENCOUNTER — Emergency Department (HOSPITAL_BASED_OUTPATIENT_CLINIC_OR_DEPARTMENT_OTHER)
Admission: EM | Admit: 2023-07-12 | Discharge: 2023-07-12 | Disposition: A | Discharge status: Home / Self Care | Payer: HMO

## 2023-07-12 ENCOUNTER — Other Ambulatory Visit: Payer: Self-pay

## 2023-07-12 ENCOUNTER — Encounter (HOSPITAL_BASED_OUTPATIENT_CLINIC_OR_DEPARTMENT_OTHER): Payer: Self-pay | Admitting: Emergency Medicine

## 2023-07-12 DIAGNOSIS — Z7901 Long term (current) use of anticoagulants: Secondary | ICD-10-CM | POA: Diagnosis not present

## 2023-07-12 DIAGNOSIS — M4722 Other spondylosis with radiculopathy, cervical region: Secondary | ICD-10-CM | POA: Diagnosis not present

## 2023-07-12 DIAGNOSIS — M4802 Spinal stenosis, cervical region: Secondary | ICD-10-CM | POA: Diagnosis not present

## 2023-07-12 DIAGNOSIS — R519 Headache, unspecified: Secondary | ICD-10-CM | POA: Insufficient documentation

## 2023-07-12 DIAGNOSIS — M50122 Cervical disc disorder at C5-C6 level with radiculopathy: Secondary | ICD-10-CM | POA: Diagnosis not present

## 2023-07-12 DIAGNOSIS — M542 Cervicalgia: Secondary | ICD-10-CM | POA: Diagnosis not present

## 2023-07-12 DIAGNOSIS — I6782 Cerebral ischemia: Secondary | ICD-10-CM | POA: Diagnosis not present

## 2023-07-12 MED ORDER — KETOROLAC TROMETHAMINE 30 MG/ML IJ SOLN
30.0000 mg | Freq: Once | INTRAMUSCULAR | Status: AC
  Administered 2023-07-12: 30 mg via INTRAMUSCULAR
  Filled 2023-07-12: qty 1

## 2023-07-12 MED ORDER — IBUPROFEN 600 MG PO TABS: 600.0000 mg | ORAL_TABLET | Freq: Four times a day (QID) | ORAL | 0 refills | Status: DC | PRN

## 2023-07-12 MED ORDER — LIDOCAINE 5 % EX PTCH
2.0000 | MEDICATED_PATCH | CUTANEOUS | Status: DC
  Administered 2023-07-12: 2 via TRANSDERMAL
  Filled 2023-07-12: qty 2

## 2023-07-12 MED ORDER — LIDOCAINE 5 % EX PTCH: 1.0000 | MEDICATED_PATCH | CUTANEOUS | 0 refills | Status: DC

## 2023-07-12 MED ORDER — PREDNISONE 20 MG PO TABS: 40.0000 mg | ORAL_TABLET | Freq: Every day | ORAL | 0 refills | Status: AC

## 2023-07-12 NOTE — ED Notes (Signed)
Pt given discharge instructions and reviewed prescriptions. Opportunities given for questions. Pt verbalizes understanding. Madi Bonfiglio R, RN 

## 2023-07-12 NOTE — ED Provider Notes (Signed)
[place] EMERGENCY DEPARTMENT AT [facility] Provider Note   CSN: [id number] Arrival date & time: 07/12/23  0840     History  Chief Complaint  Patient presents with   Neck Pain    Samantha Foley is a 89 y.o. female.  HPI   89 year old female presents to the emergency department with complaints of neck pain/headache.  Patient reports symptoms beginning 3 days ago but unsure of exactly what she was doing when symptoms began.  States that symptoms initially began at the base of her neck but since radiated to the back of her head.  Reports pain worse with movement and relieved with rest.  Has tried Tylenol with some improvement of symptoms.  Reports pain rating down bilateral forearms right greater than left to about region of elbow.  Denies any weakness or sensory deficits in upper extremities.  Denies any known trauma/injury.  States that she has had radiating pain like this before in the past that went away on its own.  Denies any chest pain, shortness of breath, exertional worsening symptoms, fever.  Past medical history significant for atrial fibrillation on Eliquis, chronic back/knee pain, GERD, hyperlipidemia, hiatal hernia, osteopenia/osteoporosis, macular degeneration, right sided shoulder pain, spinal stenosis, aortic atherosclerosis  Home Medications Prior to Admission medications   Medication Sig Start Date End Date Taking? Authorizing Provider  lidocaine (LIDODERM) 5 % Place 1 patch onto the skin daily. Remove & Discard patch within 12 hours or as directed by MD 07/12/23  Yes Sherian Maroon A, PA  acetaminophen (TYLENOL) 500 MG tablet Take 1,000 mg by mouth every 6 (six) hours as needed.    [provider]  ELIQUIS 5 MG TABS tablet TAKE 1 TABLET BY MOUTH TWICE DAILY 06/07/23   Duke Salvia, MD  flecainide [facility]) 50 MG tablet TAKE 1 TABLET TWICE A DAY 02/26/23   Duke Salvia, MD  fluticasone [facility]) 50 MCG/ACT nasal spray Place 1 spray into both  nostrils daily as needed for allergies or rhinitis.    [provider]  guaiFENesin (MUCINEX) 600 MG 12 hr tablet Take 600 mg by mouth 2 (two) times daily.    [provider]  loratadine (CLARITIN) 10 MG tablet Take 10 mg by mouth daily.    [provider]  metoprolol tartrate (LOPRESSOR) 25 MG tablet Take 1 tablet (25 mg total) by mouth 2 (two) times daily. 04/23/23   Duke Salvia, MD  Multiple Vitamin (MULTIVITAMIN WITH MINERALS) TABS tablet Take 1 tablet by mouth daily.    [provider]  Multiple Vitamins-Minerals (ICAPS AREDS 2 PO) Take 1 capsule by mouth 2 (two) times a day.    [provider]  omeprazole (PRILOSEC) 20 MG capsule TAKE 1 CAPSULE DAILY 05/20/23   Joselyn Arrow, MD  predniSONE (DELTASONE) 20 MG tablet Take 2 tablets (40 mg total) by mouth daily with breakfast for 6 days. 07/12/23 07/18/23  Peter Garter, PA  rosuvastatin (CRESTOR) 10 MG tablet TAKE 1 TABLET DAILY 05/06/23   Joselyn Arrow, MD  vitamin B-12 (CYANOCOBALAMIN) 1000 MCG tablet Take 1 tablet by mouth daily.    [provider]      Allergies    Ciprofloxacin, Scallops [shellfish allergy], Demerol, Doxycycline, Milk-related compounds, Penicillins, Rivaroxaban, and Sudafed [pseudoephedrine hcl]    Review of Systems   Review of Systems  All other systems reviewed and are negative.   Physical Exam Updated Vital Signs BP (!) 150/72   Pulse (!) 51   Temp 97.7  F (36.5 C) (Oral)   Resp 17   Ht 5' (1.524 m)   Wt 70.3 kg   SpO2 97%   BMI 30.27 kg/m  Physical Exam Vitals and nursing note reviewed.  Constitutional:      General: She is not in acute distress.    Appearance: She is well-developed.  HENT:     Head: Normocephalic and atraumatic.  Eyes:     Conjunctiva/sclera: Conjunctivae normal.  Cardiovascular:     Rate and Rhythm: Normal rate and regular rhythm.  Pulmonary:     Effort: Pulmonary effort is normal. No respiratory distress.     Breath  sounds: Normal breath sounds. No wheezing, rhonchi or rales.  Abdominal:     Palpations: Abdomen is soft.     Tenderness: There is no abdominal tenderness.  Musculoskeletal:        General: No swelling.     Cervical back: Normal range of motion and neck supple. No rigidity.     Comments: No midline tenderness cervical, thoracic spine without step-off or deformity.  Paraspinal tenderness noted bilaterally in cervical region as well as trapezial ridge bilaterally.  Full range of motion of bilateral shoulders, elbows, wrist, digits.  Muscular strength symmetric bilateral upper extremities.  Radial pulses 2+ bilaterally.  No overlying erythema, palpable fluctuance/induration.  Skin:    General: Skin is warm and dry.     Capillary Refill: Capillary refill takes less than 2 seconds.  Neurological:     Mental Status: She is alert.  Psychiatric:        Mood and Affect: Mood normal.     ED Results / Procedures / Treatments   Labs (all labs ordered are listed, but only abnormal results are displayed) Labs Reviewed - No data to display  EKG None  Radiology CT Head Wo Contrast Result Date: 07/12/2023 CLINICAL DATA:  Headache, increasing frequency or severity; Cervical radiculopathy, no red flags. Pain at the base of skull radiating down the neck and arms. EXAM: CT HEAD WITHOUT CONTRAST CT CERVICAL SPINE WITHOUT CONTRAST TECHNIQUE: Multidetector CT imaging of the head and cervical spine was performed following the standard protocol without intravenous contrast. Multiplanar CT image reconstructions of the cervical spine were also generated. RADIATION DOSE REDUCTION: This exam was performed according to the departmental dose-optimization program which includes automated exposure control, adjustment of the mA and/or kV according to patient size and/or use of iterative reconstruction technique. COMPARISON:  None Available. FINDINGS: CT HEAD FINDINGS Brain: There is no evidence of an acute infarct,  intracranial hemorrhage, mass, midline shift, or extra-axial fluid collection. Generalized cerebral atrophy is mild for age. Cerebral white matter hypodensities are nonspecific but compatible with mild chronic small vessel ischemic disease. Vascular: No hyperdense vessel or unexpected calcification. Skull: No acute fracture or suspicious osseous lesion. Sinuses/Orbits: Visualized paranasal sinuses and mastoid air cells are clear. Bilateral cataract extraction. Other: None. CT CERVICAL SPINE FINDINGS Alignment: Cervical spine straightening. Trace anterolisthesis of C3 on C4, C4 on C5, C5 on C6, C7 on T1, and T1 on T2. Skull base and vertebrae: No acute fracture or suspicious osseous lesion. Moderate atlantodental degenerative changes. Soft tissues and spinal canal: No prevertebral fluid or swelling. No visible canal hematoma. Disc levels: Focally advanced disc degeneration at C5-6 with severe disc space narrowing and degenerative endplate changes. Only mild spondylosis elsewhere. Multilevel facet arthrosis, asymmetrically advanced on the left at C3-4 and C4-5. No evidence of high-grade spinal stenosis. Mild-to-moderate neural foraminal stenosis on the left at C3-4 and on  the right at C5-6. Upper chest: Small calcified granulomas in the right lung apex. Other: Subcentimeter thyroid nodules for which no follow-up imaging is recommended. IMPRESSION: 1. No evidence of acute intracranial abnormality or cervical spine fracture. 2. Mild chronic small vessel ischemic disease. 3. Cervical disc and facet degeneration without evidence of high-grade stenosis. Electronically Signed   By: Sebastian Ache M.D.   On: 07/12/2023 11:00   CT Cervical Spine Wo Contrast Result Date: 07/12/2023 CLINICAL DATA:  Headache, increasing frequency or severity; Cervical radiculopathy, no red flags. Pain at the base of skull radiating down the neck and arms. EXAM: CT HEAD WITHOUT CONTRAST CT CERVICAL SPINE WITHOUT CONTRAST TECHNIQUE: Multidetector  CT imaging of the head and cervical spine was performed following the standard protocol without intravenous contrast. Multiplanar CT image reconstructions of the cervical spine were also generated. RADIATION DOSE REDUCTION: This exam was performed according to the departmental dose-optimization program which includes automated exposure control, adjustment of the mA and/or kV according to patient size and/or use of iterative reconstruction technique. COMPARISON:  None Available. FINDINGS: CT HEAD FINDINGS Brain: There is no evidence of an acute infarct, intracranial hemorrhage, mass, midline shift, or extra-axial fluid collection. Generalized cerebral atrophy is mild for age. Cerebral white matter hypodensities are nonspecific but compatible with mild chronic small vessel ischemic disease. Vascular: No hyperdense vessel or unexpected calcification. Skull: No acute fracture or suspicious osseous lesion. Sinuses/Orbits: Visualized paranasal sinuses and mastoid air cells are clear. Bilateral cataract extraction. Other: None. CT CERVICAL SPINE FINDINGS Alignment: Cervical spine straightening. Trace anterolisthesis of C3 on C4, C4 on C5, C5 on C6, C7 on T1, and T1 on T2. Skull base and vertebrae: No acute fracture or suspicious osseous lesion. Moderate atlantodental degenerative changes. Soft tissues and spinal canal: No prevertebral fluid or swelling. No visible canal hematoma. Disc levels: Focally advanced disc degeneration at C5-6 with severe disc space narrowing and degenerative endplate changes. Only mild spondylosis elsewhere. Multilevel facet arthrosis, asymmetrically advanced on the left at C3-4 and C4-5. No evidence of high-grade spinal stenosis. Mild-to-moderate neural foraminal stenosis on the left at C3-4 and on the right at C5-6. Upper chest: Small calcified granulomas in the right lung apex. Other: Subcentimeter thyroid nodules for which no follow-up imaging is recommended. IMPRESSION: 1. No evidence of  acute intracranial abnormality or cervical spine fracture. 2. Mild chronic small vessel ischemic disease. 3. Cervical disc and facet degeneration without evidence of high-grade stenosis. Electronically Signed   By: Sebastian Ache M.D.   On: 07/12/2023 11:00    Procedures Procedures    Medications Ordered in ED Medications  ketorolac (TORADOL) 30 MG/ML injection 30 mg (30 mg Intramuscular Given 07/12/23 1016)    ED Course/ Medical Decision Making/ A&P                                 Medical Decision Making Amount and/or Complexity of Data Reviewed Radiology: ordered.  Risk Prescription drug management.   This patient presents to the ED for concern of neck pain, this involves an extensive number of treatment options, and is a complaint that carries with it a high risk of complications and morbidity.  The differential diagnosis includes cervical radiculopathy, fracture, dislocation, meningitis, thoracic outlet syndrome, ACS, PE, pneumothorax, migraine/tension headache, carotid artery/vertebral artery dissection, other   Co morbidities that complicate the patient evaluation  See HPI   Additional history obtained:  Additional history obtained from EMR External  records from outside source obtained and reviewed including hospital records   Lab Tests:  N/a   Imaging Studies ordered:  I ordered imaging studies including CT head/cervical spine I independently visualized and interpreted imaging which showed no evidence of acute or cranial abnormality.  No acute traumatic fracture or listhesis of cervical spine.  Mild chronic small vessel ischemic change.  Cervical disc and facet degeneration without evidence of high-grade stenosis. I agree with the radiologist interpretation  The patient was maintained on a cardiac monitor.  I personally viewed and interpreted the cardiac monitored which showed an underlying rhythm of: sinus rhythm   Consultations Obtained:  I requested  consultation with attending physician Dr. Rhae Hammock who was in agreement with treatment plan going forward  Problem List / ED Course / Critical interventions / Medication management  Neck pain I ordered medication including lidocaine, Toradol  Reevaluation of the patient after these medicines showed that the patient improved I have reviewed the patients home medicines and have made adjustments as needed   Social Determinants of Health:  Denies tobacco, illicit drug use   Test / Admission - Considered:  Neck pain Vitals signs significant for hypertension blood pressure 150/72. Otherwise within normal range and stable throughout visit. Imaging studies significant for: See above 89 year old female presents emergency department complaints of neck pain that began 3 days or so ago.  On exam, paraspinal tenderness noted in cervical region.  No obvious weakness or sensory deficits in upper extremities.  No pulse deficits to suggest ischemic limb.  No overlying skin changes concerning for secondary infectious process.  Patient without evidence of meningismus clinically; low suspicion for meningitis.  CT imaging of both head and neck obtained which were negative for any acute process.  Suspect cervical radiculopathy as cause of patient's symptoms.  Treated with Toradol and did note improvement of symptoms.  Will refrain from prolonged NSAID use in the outpatient setting given concurrent anticoagulation.  Initial prescription of ibuprofen was sent by mistake and subsequently corrected.  Discussed with patient regarding continues of Tylenol versus prednisone use.  She states that Tylenol has not been helping and would prefer to attempt a trial of prednisone.  Will recommend continue topical lidocaine patches that she did note improvement of symptoms while in the ED.  Will recommend follow-up with PCP in the outpatient setting for reassessment.  Treatment plan discussed at length with patient and she acknowledged  understanding was agreeable to said plan.  Patient overall well-appearing, afebrile in no acute distress. Worrisome signs and symptoms were discussed with the patient, and the patient acknowledged understanding to return to the ED if noticed. Patient was stable upon discharge.          Final Clinical Impression(s) / ED Diagnoses Final diagnoses:  None    Rx / DC Orders ED Discharge Orders          Ordered    ibuprofen (ADVIL) 600 MG tablet  Every 6 hours PRN,   Status:  Discontinued        07/12/23 1123    lidocaine (LIDODERM) 5 %  Every 24 hours        07/12/23 1123              Peter Garter, [place] 07/12/23 1847    Durwin Glaze, MD 07/15/23 0730

## 2023-07-12 NOTE — Telephone Encounter (Cosign Needed)
Patient recommending medication update.  Will send in prednisone to local pharmacy.

## 2023-07-12 NOTE — ED Triage Notes (Signed)
Pt caox4, ambulatory c/o pain at base of skull radiating down neck to R side of neck and bilateral arms that has worsened over the past 3 days. Pt denies any recent injury or trauma. Pt denies numbness, tingling or weakness in extremities.

## 2023-07-12 NOTE — Telephone Encounter (Signed)
Sent secure chat to provider who took care of her in ER. Unsure if still working or if shift ended, so will need to wait and see

## 2023-07-12 NOTE — Telephone Encounter (Signed)
They haven't seen message yet, and I'm heading out for the afternoon. Can you keep an eye to see if rx taken care of by ER--shouldn't really fall on [place]. Thanks

## 2023-07-12 NOTE — Discharge Instructions (Addendum)
As discussed, CT scan of your head and neck appeared normal.  Suspect muscular spasm/strain as cause of your symptoms.  Recommend continued use of prednisone for treatment of your pain as well as numbing patches to use as needed.  Recommend follow-up with your primary care for reassessment.  Please do not hesitate to return if the worrisome signs and symptoms we discussed become apparent.

## 2023-07-12 NOTE — Telephone Encounter (Signed)
Kim called re her mom  She had a friend take [place] to [place] today and they told her to take Ibuprofen but she can't take that due to the Eliquis. When they told the doctor that she could not take ibuprofen, they told them she could take prednisone but did not prescribe for her I told her that you were not in today but that I would send message and cc in office provider

## 2023-07-18 ENCOUNTER — Encounter: Payer: Self-pay | Admitting: Internal Medicine

## 2023-07-19 ENCOUNTER — Other Ambulatory Visit: Payer: Self-pay | Admitting: Internal Medicine

## 2023-07-19 MED ORDER — METOPROLOL TARTRATE 25 MG PO TABS: 25.0000 mg | ORAL_TABLET | Freq: Two times a day (BID) | ORAL | 0 refills | Status: DC

## 2023-07-23 DIAGNOSIS — M542 Cervicalgia: Secondary | ICD-10-CM | POA: Diagnosis not present

## 2023-07-26 DIAGNOSIS — M545 Low back pain, unspecified: Secondary | ICD-10-CM | POA: Diagnosis not present

## 2023-07-26 DIAGNOSIS — M5412 Radiculopathy, cervical region: Secondary | ICD-10-CM | POA: Diagnosis not present

## 2023-07-28 NOTE — Progress Notes (Unsigned)
No chief complaint on file.  Samantha Foley is a 89 y.o. female who presents for annual physical exam, Medicare wellness visit and follow-up on chronic medical conditions.   I haven't seen her since 02/2022.   Neck pain--went to ER 1/17. See CT results below. Treated with a course of steroids.  Had some benefit.  She was seen in ortho urgent care on 1/29, was given additional steroids, which also provided some relief. She had f/u 1/31 with Dr. Drucie Ip. Per his notes-- Given the severity of her pain and age, recommend proceeding with MRI of the cervical spine. Follow-up after MRI for review of results and discussion of next steps. He refilled her hydrocodone 10 mg, to take half a pill BID to TID as needed.   06/2023 Head and c-spine CTs: IMPRESSION: 1. No evidence of acute intracranial abnormality or cervical spine fracture. 2. Mild chronic small vessel ischemic disease. 3. Cervical disc and facet degeneration without evidence of high-grade stenosis.   She has chronic atrial fib, under the care of Dr. Graciela Husbands.  Last saw cardiology in 01/2023 for clearance for kyphoplasty, as she hadn't been seen in their office in over a year. She continues on flecainide, metoprolol and eliquis. She is scheduled to see Dr. Graciela Husbands later this  month. The patient denies chest pain, orthopnea or peripheral edema.  No palpitations, lightheadedness or syncope.  She denies bleeding.     Insomnia: She sent a message requesting refill of sleeping med in 03/2023. She was asked to schedule appointment, since that rx was from 2021 (trazodone 5-mg), and she hadn't been seen in over a year. When she was struggling, she had been reading in bed, and having chocolate before bed. She had previously reported improvement in her insomnia when she stopped reading in bed, and switched her chocolate pops to after dinner instead of before bed. She is up 3x/night to void, gets back to sleep pretty easily.  She takes Tylenol arthritis every  night before bed. This helps prevent back pain from interfering with her sleep.  Denies pain at night on this regimen.   Osteoporosis:  She has h/o L1 compression fracture in 05/2009 and L3 compression fracture in 05/2015.  She underwent vertebroplasties in 05/2009 and 06/2015.  Most recently, she underwent kyphoplasty 03/05/2023.  She was started on Prolia after June 2021 bone density test, getting injections every 6 months. Last injection was in 11/2022. Last DEXA was in 03/2023, which showed worsening osteoporosis, T-2.9 at L fem neck. She was referred to endocrinology,and saw Dr. Deanne Coffer in 04/2023. They discussed Evenity and Forteo, and were supposed to f/u in 4 weeks.  ***UPDATE  Last vitamin D check was normal at 45.8 in 02/2022. She doesn't take separate vitamin D, takes multivitamins. Calcium or tums???  Uses almond milk in her cereal (8 oz), some almond milk yogurts. Likes sardines. No weight-bearing exercise.   Hyperlipidemia and aortic atherosclerosis: She was started on Crestor in May 2021 (aortic atherosclerosis noted on chest CT 2017) Lipids prior to statin were: 01/2018 TC 241, LDL 163, TG 131, HDL 51 She reports compliance with crestor, and denies side effect.  She is due for recheck, at goal on Crestor last year. Lab Results  Component Value Date   CHOL 146 03/08/2022   HDL 57 03/08/2022   LDLCALC 71 03/08/2022   TRIG 101 03/08/2022   CHOLHDL 2.6 03/08/2022    HH/GERD:  Takes omeprazole 20mg  daily.  She takes Tums prn for heartburn, hasn't needed recently.  She no longer gets the unrelenting belching in the afternoon. Large HH was noted on EGD done in 12/2012.  She denies dysphagia.   Macular degeneration:  Under care of ophtho at [facility].  She no longer drives. She has been getting bilateral injections, last in December. Vision has improved some.   Lipoma on L neck--thinks has gotten a little larger, slowly.  Keeps growing back.  Has had it removed twice. It isn't growing  rapidly and isn't painful. ***UPDATE  Cystocele and vaginal vault prolapse: She has been seeing uro-gyn for pessary checks through [facility]. Last seen in 02/2023. There was vaginal irritation noted.  They prescribed vaginal estrogen cream, and wanted her back for 6 month f/u (had been over a year since prior visit). She is scheduled for March.   Immunization History  Administered Date(s) Administered   Fluad Quad(high Dose 65+) 03/01/2019, 03/08/2022   Influenza Split 02/27/2012, 04/20/2020   Influenza, High Dose Seasonal PF 03/18/2018, 05/01/2021   Influenza,inj,quad, With Preservative 03/18/2018, 03/01/2019   Influenza-Unspecified 03/13/2017, 04/11/2023   PFIZER(Purple Top)SARS-COV-2 Vaccination 07/30/2019, 08/20/2019, 03/27/2020, 10/27/2020   Pfizer Covid-19 Vaccine Bivalent Booster 24yrs & up 03/27/2021   Pfizer(Comirnaty)Fall Seasonal Vaccine 12 years and older 02/26/2023   Pneumococcal Conjugate-13 01/24/2015   Pneumococcal Polysaccharide-23 04/26/2004, 01/12/2021   RSV,unspecified 08/09/2022   Td 11/26/1994, 12/23/2005   Tdap 03/07/2011, 01/24/2015   Zoster Recombinant(Shingrix) 03/06/2018, 03/27/2020   Zoster, Live 07/16/2005   Last Pap smear: 2011 (?); she is s/p hysterectomy Last mammogram: 05/2022 Last colonoscopy: 12/2012 (Dr. Laural Benes) Last DEXA:  03/2023 T-2.9 L fem neck.  Had another lumbar spinal compression fracture summer 2024 (when on Prolia) Dentist: due (has gone once since COVID) ***UPDATE Ophtho: every 6 weeks ***UDPATE Exercise: none, just housework.  Vitamin D-OH 45.8 in 02/2022    Patient Care Team: Joselyn Arrow, MD as PCP - General (Family Medicine) Duke Salvia, MD as PCP - Electrophysiology (Cardiology) Duke Salvia, MD as PCP - Cardiology (Cardiology) Verner Chol, [facility] (Inactive) as Pharmacist (Pharmacist) Shea Evans, MD as Consulting Physician (Obstetrics and Gynecology) Jene Every, MD as Consulting Physician (Orthopedic  Surgery) Ollen Gross, MD as Consulting Physician (Orthopedic Surgery) Bradly Bienenstock, MD as Consulting Physician (Orthopedic Surgery) Mettu, Renee Harder, MD as Referring Physician (Ophthalmology)  Uro-gyn: Dr. Ashley Royalty at [facility] Dentist: Dr. Kirstie Peri GI: previously Dr. Laural Benes Ortho: Dr. Shelle Iron (for R shoulder pain from torn RC and back); Dr. Lequita Halt for L knee pain;  Dr. Melvyn Novas for elbow; Dr. Odis Hollingshead for ankle.   Depression Screening: Flowsheet Row Office Visit from 03/08/2022 in [facility]  PHQ-2 Total Score 0       Falls screen:     03/08/2022    1:30 PM 09/11/2021    1:18 PM 01/12/2021    2:02 PM  Fall Risk   Falls in the past year? 1 1 0  Number falls in past yr: 0 0 0  Comment 06/29/21    Injury with Fall? 1 1 0  Comment broken L ankle L ankle fx   Risk for fall due to : History of fall(s) History of fall(s) No Fall Risks  Follow up Falls evaluation completed Falls evaluation completed Falls evaluation completed     Functional Status Survey:         02/2022--Clock--circle with 4 numbers in correct position (3/12/02/10); hands not drawn correctly for a time.    End of Life Discussion:  Patient does not have a living will or medical power of attorney but  is working on this. New forms given in the past. Last year reported forms were completed, but not yet notarized. ***UPDATE      PMH, PSH, SH and FH were reviewed and updated      ROS: denies fever, chills, URI symptoms, cough, chest pain. She denies nausea, vomiting, dysphagia.  Denies any bowel changes or abdominal pain.  Denies any vaginal discharge. Has some occasional bleeding related to pessary and urologist is aware. Denies urinary complaints, just slight leakage of urine with sneezing. Denies edema, bleeding, bruising, rashes.  Insomnia  Back pain--mild/occasional. Occasional bilateral knee pain, bilateral shoulder pain, tolerable. Denies palpitations, tachycardia. No longer gets any  heartburn, and no issues with belching anymore. Has pessary. Denies vaginal discharge. Spotting? *** Bilateral hearing aids Vision loss from macular degeneration Rare icepick HA, very short-lived (<1 min). Constipation?      PHYSICAL EXAM:  There were no vitals taken for this visit.  Wt Readings from Last 3 Encounters:  07/12/23 155 lb (70.3 kg)  01/25/23 156 lb (70.8 kg)  09/18/22 161 lb 3.2 oz (73.1 kg)    General Appearance:   Alert, cooperative, no distress, appears stated age.   Head:   Normocephalic, without obvious abnormality, atraumatic  Eyes:   PERRL, conjunctiva/corneas clear, EOM's intact, fundi not visualized  Ears:   Hearing aids bilaterally  Nose:   No drainage or sinus tenderness.   Throat:   Normal, no lesions  Neck:   Lipoma left posterior neck, nontender. It is now 5.5 x 4.5 cm in size. *** There is an overlying WHSS.  No lymphadenopathy, thyromegaly or carotid bruit  Back:    Spine nontender, no curvature, ROM normal, no CVA tenderness  Lungs:     Clear to auscultation bilaterally without wheezes, rales or  ronchi; respirations unlabored  Chest Wall:   No tenderness or deformity   Heart:   Regular rate and rhythm, no ectopy. No murmur. Normal S1 and S2  Breast Exam:   No nipple discharge, skin dimpling, breast masses or axillary lymphadenopathy  Abdomen:     Soft, non-tender, nondistended, normoactive bowel sounds, no masses, no hepatosplenomegaly  Genitalia:   Not performed       Extremities:   No clubbing, cyanosis or edema. Wearing knee high support socks bilaterally ***  Pulses:   2+ and symmetric all extremities  Skin:   Skin color, texture, turgor normal, no rashes or lesions  Lymph nodes:   Cervical, supraclavicular and axillary nodes normal  Neurologic:   Normal strength, sensation and gait (uses walker); reflexes 2+ and symmetric throughout              Psych:   Normal mood, affect, hygiene and grooming.                 ***UPDATE lipoma L neck  ,support socks?   ASSESSMENT/PLAN:   Can defer pelvic exam if not having any pelvic/vaginal symptoms/complaints    Saw endo in 04/2023--was supposed to f/u in 4 weeks to discuss starting one of the meds they discussed (evenity or forteo)-- Did they ever f/u?? Start med for osteoporosis?? (Otherwise would have been due for another Prolia in December).  Taking Calcium, or D (or just MVIs)  Uro-gyn f/u scheduled in March for pessary check. They rx'd vaginal estrogen, should be in med list  Discuss mammo Living will  Discussed monthly self breast exams and yearly mammograms (due, discussed options to continue vs stop screening); at least 30 minutes of aerobic  activity at least 5 days/week and weight-bearing exercise 2x/week; proper sunscreen use reviewed; healthy diet, including goals of calcium and vitamin D intake and alcohol recommendations (less than or equal to 1 drink/day) reviewed; regular seatbelt use; changing batteries in smoke detectors, use of carbon monoxide detectors.  Immunization recommendations discussed--up to date. Colonoscopy recommendations reviewed, no longer needed based on age.    Living Will/HC POA discussed, encouraged to get [place] copies once notarized. MOST form completed, full code, full care.    Medicare Attestation I have personally reviewed: The patient's medical and social history Their use of alcohol, tobacco or illicit drugs Their current medications and supplements The patient's functional ability including ADLs,fall risks, home safety risks, cognitive, and hearing and visual impairment Diet and physical activities Evidence for depression or mood disorders   The patient's weight, height, BMI have been recorded in the chart.  I have made referrals, counseling, and provided education to the patient based on review of the above and I have provided the patient with a written personalized care plan for preventive services.       Lavonda Jumbo, MD

## 2023-07-28 NOTE — Patient Instructions (Incomplete)
  HEALTH MAINTENANCE RECOMMENDATIONS:  It is recommended that you get at least 30 minutes of aerobic exercise at least 5 days/week (for weight loss, you may need as much as 60-90 minutes). This can be any activity that gets your heart rate up. This can be divided in 10-15 minute intervals if needed, but try and build up your endurance at least once a week.  Weight bearing exercise is also recommended twice weekly.  Eat a healthy diet with lots of vegetables, fruits and fiber.  "Colorful" foods have a lot of vitamins (ie green vegetables, tomatoes, red peppers, etc).  Limit sweet tea, regular sodas and alcoholic beverages, all of which has a lot of calories and sugar.  Up to 1 alcoholic drink daily may be beneficial for women (unless trying to lose weight, watch sugars).  Drink a lot of water.  Calcium recommendations are 1200-1500 mg daily (1500 mg for postmenopausal women or women without ovaries), and vitamin D 1000 IU daily.  This should be obtained from diet and/or supplements (vitamins), and calcium should not be taken all at once, but in divided doses.  Monthly self breast exams and yearly mammograms for women over the age of 66 is recommended.  Sunscreen of at least SPF 30 should be used on all sun-exposed parts of the skin when outside between the hours of 10 am and 4 pm (not just when at beach or pool, but even with exercise, golf, tennis, and yard work!)  Use a sunscreen that says "broad spectrum" so it covers both UVA and UVB rays, and make sure to reapply every 1-2 hours.  Remember to change the batteries in your smoke detectors when changing your clock times in the spring and fall. Carbon monoxide detectors are recommended for your home.  Use your seat belt every time you are in a car, and please drive safely and not be distracted with cell phones and texting while driving.   Samantha Foley , Thank you for taking time to come for your Medicare Wellness Visit. I appreciate your ongoing  commitment to your health goals. Please review the following plan we discussed and let me know if I can assist you in the future.   This is a list of the screening recommended for you and due dates:  Health Maintenance  Topic Date Due   COVID-19 Vaccine (7 - 2024-25 season) 04/23/2023   Medicare Annual Wellness Visit  07/28/2024   DTaP/Tdap/Td vaccine (5 - Td or Tdap) 01/23/2025   Pneumonia Vaccine  Completed   Flu Shot  Completed   DEXA scan (bone density measurement)  Completed   Zoster (Shingles) Vaccine  Completed   HPV Vaccine  Aged Out    Constipation--feel free to discuss bowel regimen with Dr. Drucie Ip. It is fine to use the Senna in the short-term.  Question is if chronic narcotics are planned, are there better medications--Miralax daily, and/or using other oral medications to treat opioid-induced constipation.  As far as sooner MRI--check with [facility] [place] [facility], [facility] or other [facility] imaging (at hospitals). You may need to get authorization for the specific location.   Take gabapentin 100 mg at bedtime. After 1 week, if not helping with your sleep, you can increase to 2 capsules at bedtime. This may potentially help with pain. If not very sedating, you can even discuss potentially using it during the day--discuss with Dr. Drucie Ip.

## 2023-07-29 ENCOUNTER — Encounter: Payer: Self-pay | Admitting: Family Medicine

## 2023-07-29 ENCOUNTER — Ambulatory Visit (INDEPENDENT_AMBULATORY_CARE_PROVIDER_SITE_OTHER): Payer: HMO | Admitting: Family Medicine

## 2023-07-29 VITALS — BP 120/64 | HR 60 | Ht 60.0 in | Wt 152.8 lb

## 2023-07-29 DIAGNOSIS — Z7901 Long term (current) use of anticoagulants: Secondary | ICD-10-CM | POA: Diagnosis not present

## 2023-07-29 DIAGNOSIS — E7800 Pure hypercholesterolemia, unspecified: Secondary | ICD-10-CM

## 2023-07-29 DIAGNOSIS — G8929 Other chronic pain: Secondary | ICD-10-CM

## 2023-07-29 DIAGNOSIS — D170 Benign lipomatous neoplasm of skin and subcutaneous tissue of head, face and neck: Secondary | ICD-10-CM | POA: Diagnosis not present

## 2023-07-29 DIAGNOSIS — Z5181 Encounter for therapeutic drug level monitoring: Secondary | ICD-10-CM | POA: Diagnosis not present

## 2023-07-29 DIAGNOSIS — Z0000 Encounter for general adult medical examination without abnormal findings: Secondary | ICD-10-CM | POA: Diagnosis not present

## 2023-07-29 DIAGNOSIS — G4700 Insomnia, unspecified: Secondary | ICD-10-CM | POA: Diagnosis not present

## 2023-07-29 DIAGNOSIS — K5903 Drug induced constipation: Secondary | ICD-10-CM | POA: Diagnosis not present

## 2023-07-29 DIAGNOSIS — I480 Paroxysmal atrial fibrillation: Secondary | ICD-10-CM | POA: Diagnosis not present

## 2023-07-29 DIAGNOSIS — K219 Gastro-esophageal reflux disease without esophagitis: Secondary | ICD-10-CM

## 2023-07-29 DIAGNOSIS — M810 Age-related osteoporosis without current pathological fracture: Secondary | ICD-10-CM | POA: Diagnosis not present

## 2023-07-29 DIAGNOSIS — I700 Atherosclerosis of aorta: Secondary | ICD-10-CM | POA: Diagnosis not present

## 2023-07-29 DIAGNOSIS — T402X5A Adverse effect of other opioids, initial encounter: Secondary | ICD-10-CM

## 2023-07-29 MED ORDER — GABAPENTIN 100 MG PO CAPS
ORAL_CAPSULE | ORAL | 0 refills | Status: DC
Start: 2023-07-29 — End: 2023-09-04

## 2023-07-29 MED ORDER — OMEPRAZOLE 20 MG PO CPDR
20.0000 mg | DELAYED_RELEASE_CAPSULE | Freq: Every day | ORAL | 1 refills | Status: DC
Start: 2023-07-29 — End: 2024-01-27

## 2023-07-29 MED ORDER — ROSUVASTATIN CALCIUM 10 MG PO TABS
10.0000 mg | ORAL_TABLET | Freq: Every day | ORAL | 0 refills | Status: DC
Start: 2023-07-29 — End: 2023-09-23

## 2023-08-08 ENCOUNTER — Other Ambulatory Visit: Payer: HMO

## 2023-08-08 DIAGNOSIS — Z5181 Encounter for therapeutic drug level monitoring: Secondary | ICD-10-CM

## 2023-08-08 DIAGNOSIS — Z7901 Long term (current) use of anticoagulants: Secondary | ICD-10-CM

## 2023-08-08 DIAGNOSIS — E7800 Pure hypercholesterolemia, unspecified: Secondary | ICD-10-CM

## 2023-08-08 LAB — LIPID PANEL

## 2023-08-09 ENCOUNTER — Encounter: Payer: Self-pay | Admitting: Family Medicine

## 2023-08-09 LAB — CMP14+EGFR
ALT: 15 [IU]/L (ref 0–32)
AST: 17 IU/L (ref 0–40)
Albumin: 3.7 g/dL (ref 3.6–4.6)
Alkaline Phosphatase: 95 IU/L (ref 44–121)
BUN/Creatinine Ratio: 13 (ref 12–28)
BUN: 11 mg/dL (ref 10–36)
Bilirubin Total: 0.3 mg/dL (ref 0.0–1.2)
CO2: 25 mmol/L (ref 20–29)
Calcium: 9.4 mg/dL (ref 8.7–10.3)
Chloride: 104 mmol/L (ref 96–106)
Creatinine, Ser: 0.82 mg/dL (ref 0.57–1.00)
Globulin, Total: 2.4 g/dL (ref 1.5–4.5)
Glucose: 97 mg/dL (ref 70–99)
Potassium: 3.8 mmol/L (ref 3.5–5.2)
Sodium: 145 mmol/L — ABNORMAL HIGH (ref 134–144)
Total Protein: 6.1 g/dL (ref 6.0–8.5)
eGFR: 67 mL/min/{1.73_m2} (ref >59)

## 2023-08-09 LAB — CBC WITH DIFFERENTIAL/PLATELET
Basophils Absolute: 0.1 10*3/uL (ref 0.0–0.2)
Basos: 1 %
EOS (ABSOLUTE): 0.1 10*3/uL (ref 0.0–0.4)
Eos: 2 %
Hematocrit: 37.4 % (ref 34.0–46.6)
Hemoglobin: 12.4 g/dL (ref 11.1–15.9)
Immature Grans (Abs): 0 10*3/uL (ref 0.0–0.1)
Immature Granulocytes: 0 %
Lymphocytes Absolute: 1 10*3/uL (ref 0.7–3.1)
Lymphs: 16 %
MCH: 29.7 pg (ref 26.6–33.0)
MCHC: 33.2 g/dL (ref 31.5–35.7)
MCV: 90 fL (ref 79–97)
Monocytes Absolute: 0.5 10*3/uL (ref 0.1–0.9)
Monocytes: 8 %
Neutrophils Absolute: 4.8 10*3/uL (ref 1.4–7.0)
Neutrophils: 73 %
Platelets: 222 10*3/uL (ref 150–450)
RBC: 4.18 x10E6/uL (ref 3.77–5.28)
RDW: 13.4 % (ref 11.7–15.4)
WBC: 6.6 10*3/uL (ref 3.4–10.8)

## 2023-08-09 LAB — LIPID PANEL
Cholesterol, Total: 132 mg/dL (ref 100–199)
HDL: 53 mg/dL (ref >39)
LDL CALC COMMENT:: 2.5 ratio (ref 0.0–4.4)
LDL Chol Calc (NIH): 65 mg/dL (ref 0–99)
Triglycerides: 71 mg/dL (ref 0–149)
VLDL Cholesterol Cal: 14 mg/dL (ref 5–40)

## 2023-08-10 ENCOUNTER — Other Ambulatory Visit: Payer: Self-pay | Admitting: Internal Medicine

## 2023-08-11 ENCOUNTER — Encounter: Payer: Self-pay | Admitting: Internal Medicine

## 2023-08-12 MED ORDER — METOPROLOL TARTRATE 25 MG PO TABS: 25.0000 mg | ORAL_TABLET | Freq: Two times a day (BID) | ORAL | 0 refills | Status: DC

## 2023-08-14 ENCOUNTER — Ambulatory Visit: Payer: HMO | Admitting: Internal Medicine

## 2023-08-18 DIAGNOSIS — M5412 Radiculopathy, cervical region: Secondary | ICD-10-CM | POA: Diagnosis not present

## 2023-08-22 MED ORDER — METOPROLOL TARTRATE 25 MG PO TABS: 25.0000 mg | ORAL_TABLET | Freq: Two times a day (BID) | ORAL | 0 refills | Status: DC

## 2023-08-26 ENCOUNTER — Encounter: Payer: Self-pay | Admitting: Pulmonary Disease

## 2023-08-26 ENCOUNTER — Other Ambulatory Visit: Payer: Self-pay

## 2023-08-26 ENCOUNTER — Encounter (HOSPITAL_COMMUNITY): Payer: Self-pay

## 2023-08-26 ENCOUNTER — Ambulatory Visit: Attending: Pulmonary Disease | Admitting: Pulmonary Disease

## 2023-08-26 ENCOUNTER — Emergency Department (HOSPITAL_COMMUNITY)
Admission: EM | Admit: 2023-08-26 | Discharge: 2023-08-26 | Disposition: A | Discharge status: Home / Self Care | Attending: Emergency Medicine | Admitting: Emergency Medicine

## 2023-08-26 ENCOUNTER — Encounter: Payer: Self-pay | Admitting: Internal Medicine

## 2023-08-26 VITALS — BP 122/62 | HR 71 | Ht 62.0 in | Wt 152.0 lb

## 2023-08-26 DIAGNOSIS — T462X1A Poisoning by other antidysrhythmic drugs, accidental (unintentional), initial encounter: Secondary | ICD-10-CM | POA: Insufficient documentation

## 2023-08-26 DIAGNOSIS — R9431 Abnormal electrocardiogram [ECG] [EKG]: Secondary | ICD-10-CM | POA: Diagnosis not present

## 2023-08-26 DIAGNOSIS — R0602 Shortness of breath: Secondary | ICD-10-CM

## 2023-08-26 DIAGNOSIS — R002 Palpitations: Secondary | ICD-10-CM

## 2023-08-26 DIAGNOSIS — I480 Paroxysmal atrial fibrillation: Secondary | ICD-10-CM | POA: Diagnosis not present

## 2023-08-26 DIAGNOSIS — Z79899 Other long term (current) drug therapy: Secondary | ICD-10-CM | POA: Diagnosis not present

## 2023-08-26 DIAGNOSIS — T50901A Poisoning by unspecified drugs, medicaments and biological substances, accidental (unintentional), initial encounter: Secondary | ICD-10-CM | POA: Diagnosis not present

## 2023-08-26 DIAGNOSIS — Z7901 Long term (current) use of anticoagulants: Secondary | ICD-10-CM | POA: Insufficient documentation

## 2023-08-26 LAB — COMPREHENSIVE METABOLIC PANEL
ALT: 15 U/L (ref 0–44)
AST: 22 U/L (ref 15–41)
Albumin: 3.3 g/dL — ABNORMAL LOW (ref 3.5–5.0)
Alkaline Phosphatase: 78 U/L (ref 38–126)
Anion gap: 12 (ref 5–15)
BUN: 13 mg/dL (ref 8–23)
CO2: 23 mmol/L (ref 22–32)
Calcium: 9.3 mg/dL (ref 8.9–10.3)
Chloride: 103 mmol/L (ref 98–111)
Creatinine, Ser: 0.78 mg/dL (ref 0.44–1.00)
GFR, Estimated: >60 mL/min (ref >60)
Glucose, Bld: 102 mg/dL — ABNORMAL HIGH (ref 70–99)
Potassium: 4 mmol/L (ref 3.5–5.1)
Sodium: 138 mmol/L (ref 135–145)
Total Bilirubin: 0.7 mg/dL (ref 0.0–1.2)
Total Protein: 6.6 g/dL (ref 6.5–8.1)

## 2023-08-26 LAB — CBC WITH DIFFERENTIAL/PLATELET
Abs Immature Granulocytes: 0.03 10*3/uL (ref 0.00–0.07)
Basophils Absolute: 0.1 10*3/uL (ref 0.0–0.1)
Basophils Relative: 1 %
Eosinophils Absolute: 0 10*3/uL (ref 0.0–0.5)
Eosinophils Relative: 0 %
HCT: 39.3 % (ref 36.0–46.0)
Hemoglobin: 12.8 g/dL (ref 12.0–15.0)
Immature Granulocytes: 0 %
Lymphocytes Relative: 10 %
Lymphs Abs: 1 10*3/uL (ref 0.7–4.0)
MCH: 29.3 pg (ref 26.0–34.0)
MCHC: 32.6 g/dL (ref 30.0–36.0)
MCV: 89.9 fL (ref 80.0–100.0)
Monocytes Absolute: 0.6 10*3/uL (ref 0.1–1.0)
Monocytes Relative: 6 %
Neutro Abs: 7.9 10*3/uL — ABNORMAL HIGH (ref 1.7–7.7)
Neutrophils Relative %: 83 %
Platelets: 213 10*3/uL (ref 150–400)
RBC: 4.37 MIL/uL (ref 3.87–5.11)
RDW: 14.6 % (ref 11.5–15.5)
WBC: 9.6 10*3/uL (ref 4.0–10.5)
nRBC: 0 % (ref 0.0–0.2)

## 2023-08-26 NOTE — Discharge Instructions (Addendum)
 You have been seen and discharged from the emergency department.  The flecainide should be enough out of your system but there is no concern for overdose.  If you have any significant weakness, slow heart rate you may reach out to the [facility] at [phone].  Discontinue flecainide as previously recommended by the cardiology service.  Follow-up with your primary provider for further evaluation and further care. Take home medications as prescribed. If you have any worsening symptoms or further concerns for your health please return to an emergency department for further evaluation.

## 2023-08-26 NOTE — Telephone Encounter (Signed)
 Pts daughter called to report that she discovered that the pt took all 7 Flecainide this morin that was I her pill box... she will call EMS and have her taken to the ED for monitoring.

## 2023-08-26 NOTE — Progress Notes (Signed)
 Electrophysiology Office Note:   Date:  08/26/2023  ID:  Samantha Foley, DOB 1929-12-01, MRN [id number]  Primary Cardiologist: Sherryl Manges, MD Primary Heart Failure: None Electrophysiologist: Sherryl Manges, MD      History of Present Illness:   Samantha Foley is a 89 y.o. female with h/o AF seen today for acute visit due to shortness of breath.    Patient reports she has been having episodes of feeling funny that are associated with shortness of breath.  She has had at least 3 episodes of shortness of breath that have occurred over the last week at rest.  No precipitating factors. Denies associated symptoms such as lightheadedness, chest pain or swelling in legs or abdomen.  Patient's daughter prepares her medications for the week.  She indicates she does not believe that there could be a mix up of her medications.  She denies chest pain, palpitations, dyspnea, PND, orthopnea, nausea, vomiting, dizziness, syncope, edema, weight gain, or early satiety.   Review of systems complete and found to be negative unless listed in HPI.   EP Information / Studies Reviewed:    EKG is ordered today. Personal review as below.  EKG Interpretation Date/Time:  Monday August 26 2023 14:45:02 EST Ventricular Rate:  71 PR Interval:  208 QRS Duration:  166 QT Interval:  492 QTC Calculation: 534 R Axis:   118  Text Interpretation: Normal sinus rhythm Right bundle branch block Left posterior fascicular block Bifascicular block Confirmed by Canary Brim ([id number]) on 08/26/2023 5:53:48 PM   Studies:  ECHO 10/2018 > LVEF 60-65%, RV systolic function mildly reduced, no evidence of MV stenosis, mild AV regurgitation   Arrhythmia / AAD AF Flecainide     Risk Assessment/Calculations:    CHA2DS2-VASc Score = 3   This indicates a 3.2% annual risk of stroke. The patient's score is based upon: CHF History: 0 HTN History: 0 Diabetes History: 0 Stroke History: 0 Vascular Disease History: 0 Age Score:  2 Gender Score: 1             Physical Exam:   VS:  BP 122/62   Pulse 71   Ht 5\' 2"  (1.575 m)   Wt 152 lb (68.9 kg)   SpO2 94%   BMI 27.80 kg/m    Wt Readings from Last 3 Encounters:  08/26/23 152 lb (68.9 kg)  07/29/23 152 lb 12.8 oz (69.3 kg)  07/12/23 155 lb (70.3 kg)     GEN: Well nourished, well developed in no acute distress NECK: No JVD; No carotid bruits CARDIAC: Regular rate and rhythm, no murmurs, rubs, gallops RESPIRATORY:  Clear to auscultation without rales, wheezing or rhonchi  ABDOMEN: Soft, non-tender, non-distended EXTREMITIES:  No edema; No deformity   ASSESSMENT AND PLAN:    Shortness of Breath  Hx of Dyspnea on Exertion Palpitations  -recent labs from 2/13 reviewed, wnl  -no crackles or edema on exam  -?given new conduction on EKG query if intermittent block  -7 day cardiac monitor to assess rhythm  Paroxysmal AF High Risk Drug Monitoring: Flecainide  CHA2DS2-VASc 3  -STOP Flecainide given changes on EKG, **see below  -monitor as above -Metoprolol, eliquis -EKG with new RBBB, LPFB   Follow up with Dr. Graciela Husbands  as scheduled on 09/16/23     **Patients daughter called the clinic back after visit and informed staff that the patient had not taken the flecainide out of her pill box but rather took "7 tablets" out of the bottle and took all  of them this am.  I worry she has been accidentally taking the wrong pills for several days as the symptoms have been occurring this week / are new.  She has access to the flecainide bottles which are full according to the daughter but she said she could not count them as it would take too long. They apparently called EMS and she states that EMS told her that the "EKG was ok and that they did not need to go to the ER".  I instructed them to go to the ER for evaluation out of concern for flecainide overdose.   Signed, Canary Brim, NP-C, AGACNP-BC [facility] - Electrophysiology  08/26/2023, 5:55 PM

## 2023-08-26 NOTE — ED Triage Notes (Signed)
 Pt came in via POV d/t accidentally taking too much Flecainide this morning at 1000, reports she took 7 pills instead of 1, taking a total of 350 mg.

## 2023-08-26 NOTE — ED Provider Triage Note (Signed)
 Emergency Medicine Provider Triage Evaluation Note  Samantha Foley , a 89 y.o. female  was evaluated in triage.  Pt complains of accidental overdose of flecainide.  Accidentally took 7 tablets instead of 1 of flecainide this morning.  She is asymptomatic.  Review of Systems  Positive:  Negative:   Physical Exam  BP (!) 173/62   Pulse 60   Temp 98.6 F (37 C)   Resp 18   Ht 5\' 2"  (1.575 m)   Wt 68.9 kg   SpO2 98%   BMI 27.80 kg/m  Gen:   Awake, no distress   Resp:  Normal effort  MSK:   Moves extremities without difficulty  Other:  Cardiac regular rate and rhythm  Medical Decision Making  Medically screening exam initiated at 7:15 PM.  Appropriate orders placed.  JANARIA MCCAMMON was informed that the remainder of the evaluation will be completed by another provider, this initial triage assessment does not replace that evaluation, and the importance of remaining in the ED until their evaluation is complete.     Halford Decamp, PA-C 08/26/23 1916

## 2023-08-26 NOTE — ED Provider Notes (Signed)
 [place] EMERGENCY DEPARTMENT AT [facility] Provider Note   CSN: [id number] Arrival date & time: 08/26/23  1839     History  Chief Complaint  Patient presents with   Accidental OverDose    Samantha Foley is a 89 y.o. female.  HPI   89 year old female presents emergency department with concern for accidental overdose of flecainide around 10 AM.  Patient was seen by cardiology, they were recommending that she discontinue flecainide due to some subtle changes on the EKG.  This is when she revealed that she took 7 additional doses of her flecainide by accident this morning.  This was approximately 11 hours ago.  Currently she denies any acute symptoms like chest pain, difficulty breathing, slow heart rate, dizziness.  Home Medications Prior to Admission medications   Medication Sig Start Date End Date Taking? Authorizing Provider  acetaminophen (TYLENOL) 500 MG tablet Take 1,000 mg by mouth every 6 (six) hours as needed.    [provider]  ELIQUIS 5 MG TABS tablet TAKE 1 TABLET BY MOUTH TWICE DAILY 06/07/23   Duke Salvia, MD  estradiol (ESTRACE) 0.1 MG/GM vaginal cream Place 1 Applicatorful vaginally 3 (three) times a week. 03/21/23   [provider]  fluticasone (FLONASE) 50 MCG/ACT nasal spray Place 1 spray into both nostrils daily as needed for allergies or rhinitis.    [provider]  gabapentin (NEURONTIN) 100 MG capsule Take 1 capsule at bedtime.  May increase to 2 capsules after a week, if needed 07/29/23   Joselyn Arrow, MD  HYDROcodone-acetaminophen [facility]) 10-325 MG tablet Take 1 tablet by mouth every 4 (four) hours as needed. 07/26/23   [provider]  loratadine (CLARITIN) 10 MG tablet Take 10 mg by mouth daily.    [provider]  metoprolol tartrate (LOPRESSOR) 25 MG tablet Take 1 tablet (25 mg total) by mouth 2 (two) times daily. You must keep upcoming appointment with Dr. Graciela Husbands in March 2025 for future refills.  08/22/23   Duke Salvia, MD  Multiple Vitamin (MULTIVITAMIN WITH MINERALS) TABS tablet Take 1 tablet by mouth daily.    [provider]  Multiple Vitamins-Minerals (ICAPS AREDS 2 PO) Take 1 capsule by mouth 2 (two) times a day.    [provider]  omeprazole (PRILOSEC) 20 MG capsule Take 1 capsule (20 mg total) by mouth daily. 07/29/23   Joselyn Arrow, MD  rosuvastatin (CRESTOR) 10 MG tablet Take 1 tablet (10 mg total) by mouth daily. 07/29/23   Joselyn Arrow, MD  vitamin B-12 (CYANOCOBALAMIN) 1000 MCG tablet Take 1 tablet by mouth daily.    [provider]      Allergies    Apixaban, Ciprofloxacin, Scallops [shellfish allergy], Doxycycline, Penicillins, Rivaroxaban, Demerol, Milk-related compounds, and Sudafed [pseudoephedrine hcl]    Review of Systems   Review of Systems  Constitutional:  Negative for fever.  Respiratory:  Negative for shortness of breath.   Cardiovascular:  Negative for chest pain.  Gastrointestinal:  Negative for abdominal pain, diarrhea and vomiting.  Skin:  Negative for rash.  Neurological:  Negative for headaches.    Physical Exam Updated Vital Signs BP (!) 158/53   Pulse 62   Temp 98.6 F (37 C)   Resp 13   Ht 5\' 2"  (1.575 m)   Wt 68.9 kg   SpO2 99%   BMI 27.80 kg/m  Physical Exam Vitals and nursing note reviewed.  Constitutional:      Appearance: Normal appearance.  HENT:  Head: Normocephalic.     Mouth/Throat:     Mouth: Mucous membranes are moist.  Cardiovascular:     Rate and Rhythm: Normal rate.  Pulmonary:     Effort: Pulmonary effort is normal. No respiratory distress.  Abdominal:     Palpations: Abdomen is soft.     Tenderness: There is no abdominal tenderness.  Skin:    General: Skin is warm.  Neurological:     Mental Status: She is alert and oriented to person, place, and time. Mental status is at baseline.  Psychiatric:        Mood and Affect: Mood normal.     ED Results / Procedures / Treatments    Labs (all labs ordered are listed, but only abnormal results are displayed) Labs Reviewed  CBC WITH DIFFERENTIAL/PLATELET - Abnormal; Notable for the following components:      Result Value   Neutro Abs 7.9 (*)    All other components within normal limits  COMPREHENSIVE METABOLIC PANEL - Abnormal; Notable for the following components:   Glucose, Bld 102 (*)    Albumin 3.3 (*)    All other components within normal limits    EKG EKG Interpretation Date/Time:  Monday August 26 2023 19:20:43 EST Ventricular Rate:  63 PR Interval:  212 QRS Duration:  98 QT Interval:  456 QTC Calculation: 466 R Axis:   93  Text Interpretation: Suspect arm lead reversal, interpretation assumes no reversal Sinus rhythm with sinus arrhythmia with 1st degree A-V block Rightward axis Incomplete right bundle branch block Borderline ECG When compared with ECG of 26-Aug-2023 14:45, PREVIOUS ECG IS PRESENT Confirmed by Coralee Pesa [id number]) on 08/26/2023 7:36:24 PM  Radiology No results found.  Procedures Procedures    Medications Ordered in ED Medications - No data to display  ED Course/ Medical Decision Making/ A&P                                 Medical Decision Making  89 year old female presents emergency department with concern for flecainide overdose.  Vitals are normal on arrival.  She denies any acute symptoms at this time.  Blood work is reassuring, electrolytes are normal.  EKG shows sinus rhythm with normal QRS and QTc.  Discussions with poison control stated that they would have recommended a 4-hour observation.  She is now at 11 hours with no acute symptoms and normal workup.  Will plan to discontinue the flecainide as per cardiology request and plan for outpatient follow-up.  Patient at this time appears safe and stable for discharge and close outpatient follow up. Discharge plan and strict return to ED precautions discussed, patient verbalizes understanding and  agreement.        Final Clinical Impression(s) / ED Diagnoses Final diagnoses:  Medication overdose, accidental or unintentional, initial encounter    Rx / DC Orders ED Discharge Orders     None         Rozelle Logan, DO 08/26/23 2122

## 2023-08-26 NOTE — Patient Instructions (Addendum)
 Medication Instructions:   STOP TAKING :  FLECAINIDE   *If you need a refill on your cardiac medications before your next appointment, please call your pharmacy*   Lab Work: NONE ORDERED  TODAY    If you have labs (blood work) drawn today and your tests are completely normal, you will receive your results only by: MyChart Message (if you have MyChart) OR A paper copy in the mail If you have any lab test that is abnormal or we need to change your treatment, we will call you to review the results.   Testing/Procedures: NONE ORDERED  TODAY   Follow-Up: At [facility], you and your health needs are our priority.  As part of our continuing mission to provide you with exceptional heart care, we have created designated Provider Care Teams.  These Care Teams include your primary Cardiologist (physician) and Advanced Practice Providers (APPs -  Physician Assistants and Nurse Practitioners) who all work together to provide you with the care you need, when you need it.  We recommend signing up for the patient portal called "MyChart".  Sign up information is provided on this After Visit Summary.  MyChart is used to connect with patients for Virtual Visits (Telemedicine).  Patients are able to view lab/test results, encounter notes, upcoming appointments, etc.  Non-urgent messages can be sent to your provider as well.   To learn more about what you can do with MyChart, go to [email or web address].     Your next appointment:  AS SCHEDULED    ZIO XT- Long Term Monitor Instructions  Your physician has requested you wear a ZIO patch monitor for 7 days.  This is a single patch monitor. [employer] supplies one patch monitor per enrollment. Additional stickers are not available. Please do not apply patch if you will be having a Nuclear Stress Test,  Echocardiogram, Cardiac CT, MRI, or Chest Xray during the period you would be wearing the  monitor. The patch cannot be worn during these tests.  You cannot remove and re-apply the  ZIO XT patch monitor.  Your ZIO patch monitor will be mailed 3 day USPS to your address on file. It may take 3-5 days  to receive your monitor after you have been enrolled.  Once you have received your monitor, please review the enclosed instructions. Your monitor  has already been registered assigning a specific monitor serial # to you.  Billing and Patient Assistance Program Information  We have supplied [employer] with any of your insurance information on file for billing purposes. [employer] offers a sliding scale Patient Assistance Program for patients that do not have  insurance, or whose insurance does not completely cover the cost of the ZIO monitor.  You must apply for the Patient Assistance Program to qualify for this discounted rate.  To apply, please call [employer] at [phone], select option 4, select option 2, ask to apply for  Patient Assistance Program. Meredeth Ide will ask your household income, and how many people  are in your household. They will quote your out-of-pocket cost based on that information.  [employer] will also be able to set up a 18-month, interest-free payment plan if needed.  Applying the monitor   Shave hair from upper left chest.  Hold abrader disc by orange tab. Rub abrader in 40 strokes over the upper left chest as  indicated in your monitor instructions.  Clean area with 4 enclosed alcohol pads. Let dry.  Apply patch as indicated in monitor instructions. Patch will be placed  under collarbone on left  side of chest with arrow pointing upward.  Rub patch adhesive wings for 2 minutes. Remove white label marked "1". Remove the white  label marked "2". Rub patch adhesive wings for 2 additional minutes.  While looking in a mirror, press and release button in center of patch. A small green light will  flash 3-4 times. This will be your only indicator that the monitor has been turned on.  Do not shower for the first 24 hours. You  may shower after the first 24 hours.  Press the button if you feel a symptom. You will hear a small click. Record Date, Time and  Symptom in the Patient Logbook.  When you are ready to remove the patch, follow instructions on the last 2 pages of Patient  Logbook. Stick patch monitor onto the last page of Patient Logbook.  Place Patient Logbook in the blue and white box. Use locking tab on box and tape box closed  securely. The blue and white box has prepaid postage on it. Please place it in the mailbox as  soon as possible. Your physician should have your test results approximately 7 days after the  monitor has been mailed back to [facility].  Call [facility] Customer Care at [phone] if you have questions regarding  your ZIO XT patch monitor. Call them immediately if you see an orange light blinking on your  monitor.  If your monitor falls off in less than 4 days, contact our Monitor department at [phone].  If your monitor becomes loose or falls off after 4 days call [employer] at [phone] for  suggestions on securing your monitor   Other Instructions    1st Floor: - Lobby - Registration  - Pharmacy  - Lab - Cafe  2nd Floor: - PV Lab - Diagnostic Testing (echo, CT, nuclear med)  3rd Floor: - Vacant  4th Floor: - TCTS (cardiothoracic surgery) - AFib Clinic - Structural Heart Clinic - Vascular Surgery  - Vascular Ultrasound  5th Floor: - HeartCare Cardiology (general and EP) - Clinical Pharmacy for coumadin, hypertension, lipid, weight-loss medications, and med management appointments    Valet parking services will be available as well.

## 2023-08-27 ENCOUNTER — Ambulatory Visit: Attending: Pulmonary Disease

## 2023-08-27 DIAGNOSIS — R002 Palpitations: Secondary | ICD-10-CM

## 2023-08-27 DIAGNOSIS — R0602 Shortness of breath: Secondary | ICD-10-CM

## 2023-08-27 DIAGNOSIS — I480 Paroxysmal atrial fibrillation: Secondary | ICD-10-CM

## 2023-08-27 NOTE — Progress Notes (Unsigned)
 ZIO  Serial # [id number] from office inventory given to son with verbal instructions.  Dr. Graciela Husbands to read.

## 2023-08-31 ENCOUNTER — Other Ambulatory Visit: Payer: Self-pay | Admitting: Internal Medicine

## 2023-09-04 ENCOUNTER — Ambulatory Visit (INDEPENDENT_AMBULATORY_CARE_PROVIDER_SITE_OTHER): Admitting: Family Medicine

## 2023-09-04 ENCOUNTER — Encounter: Payer: Self-pay | Admitting: Family Medicine

## 2023-09-04 VITALS — BP 124/60 | HR 76 | Ht 62.0 in | Wt 151.8 lb

## 2023-09-04 DIAGNOSIS — T161XXA Foreign body in right ear, initial encounter: Secondary | ICD-10-CM

## 2023-09-04 DIAGNOSIS — H6123 Impacted cerumen, bilateral: Secondary | ICD-10-CM | POA: Diagnosis not present

## 2023-09-04 NOTE — Progress Notes (Signed)
 Chief Complaint  Patient presents with   Consult    Right ear has a piece of hearing aide stuck in it and left ear needs cleaned as well.    She went to get new hearing aids this past Monday. The person at there noticed something broken off in her R ear.  Hearing aids were old, not working as well. She thinks she lost the right hearing aid--states that when ambulance came, lots of people in the house, she lost the R hearing aid, maybe somebody stepped on it. She forgot to wear the left hearing aid today.  She has had recent visits to cardiology, and to ER (took too much flecainide in error). She occasionally gets short of breath. Not bad currently. No other complaints today.   PMH, PSH, SH reviewed  Outpatient Encounter Medications as of 09/04/2023  Medication Sig Note   ELIQUIS 5 MG TABS tablet TAKE 1 TABLET BY MOUTH TWICE DAILY    metoprolol tartrate (LOPRESSOR) 25 MG tablet Take 1 tablet (25 mg total) by mouth 2 (two) times daily. You must keep upcoming appointment with Dr. Graciela Husbands in March 2025 for future refills.    Multiple Vitamin (MULTIVITAMIN WITH MINERALS) TABS tablet Take 1 tablet by mouth daily.    Multiple Vitamins-Minerals (ICAPS AREDS 2 PO) Take 1 capsule by mouth 2 (two) times a day.    omeprazole (PRILOSEC) 20 MG capsule Take 1 capsule (20 mg total) by mouth daily.    rosuvastatin (CRESTOR) 10 MG tablet Take 1 tablet (10 mg total) by mouth daily.    acetaminophen (TYLENOL) 500 MG tablet Take 1,000 mg by mouth every 6 (six) hours as needed. (Patient not taking: Reported on 09/04/2023) 09/04/2023: As needed   estradiol (ESTRACE) 0.1 MG/GM vaginal cream Place 1 Applicatorful vaginally 3 (three) times a week. (Patient not taking: Reported on 09/04/2023)    fluticasone (FLONASE) 50 MCG/ACT nasal spray Place 1 spray into both nostrils daily as needed for allergies or rhinitis. (Patient not taking: Reported on 09/04/2023) 09/04/2023: As needed   HYDROcodone-acetaminophen (NORCO)  10-325 MG tablet Take 1 tablet by mouth every 4 (four) hours as needed. (Patient not taking: Reported on 09/04/2023) 09/04/2023: As needed   loratadine (CLARITIN) 10 MG tablet Take 10 mg by mouth daily. (Patient not taking: Reported on 09/04/2023) 09/04/2023: As needed   vitamin B-12 (CYANOCOBALAMIN) 1000 MCG tablet Take 1 tablet by mouth daily. (Patient not taking: Reported on 09/04/2023)    [DISCONTINUED] gabapentin (NEURONTIN) 100 MG capsule Take 1 capsule at bedtime.  May increase to 2 capsules after a week, if needed    No facility-administered encounter medications on file as of 09/04/2023.   Allergies  Allergen Reactions   Apixaban Other (See Comments)    fatigue  Other Reaction(s): confusion, Dizziness, Other (See Comments)  Fatigue   Ciprofloxacin Shortness Of Breath and Rash   Scallops [Shellfish Allergy] Anaphylaxis   Doxycycline Nausea And Vomiting and Nausea Only    Other Reaction(s): GI Intolerance, Headache   Penicillins Other (See Comments)    Did it involve swelling of the face/tongue/throat, SOB, or low BP? Unknown  Did it involve sudden or severe rash/hives, skin peeling, or any reaction on the inside of your mouth or nose? Unknown  Did you need to seek medical attention at a hospital or doctor's office? Unknown  When did it last happen?        If all above answers are "NO", may proceed with cephalosporin use.  Other Reaction(s): Other (See  Comments), Unknown  Childhood reaction unknown   Rivaroxaban Other (See Comments)    Pt unsure but d/c use  Other Reaction(s): Other (See Comments)  Reaction unknown   Demerol Other (See Comments)    Patient states it made her crazy and sleep for 12 hours   Milk-Related Compounds Diarrhea   Sudafed [Pseudoephedrine Hcl] Other (See Comments)    hyperactive    ROS: no f/c, URI symptoms, ear pain. +decreased hearing. No chest pain.  Occ shortness of breath.    PHYSICAL EXAM:  BP 124/60   Pulse 76   Ht 5\' 2"  (1.575  m)   Wt 151 lb 12.8 oz (68.9 kg)   BMI 27.76 kg/m   Pleasant female who appears younger than stated age, in good spirits HEENT: conjunctiva and sclera are clear, EOMI. L TM obscured by dry/hard cerumen R TM obscured by blue-colored foreign body. Alligator forceps were used to remove the rubber tip of a hearing aid, without any pain.  Removed in one piece. Some residual cerumen in canal. After bilateral ear lavage, L was completely clear, R had only small amount of cerumen remaining, along the edge of the external portion of canal (distal).  TM's are normal bilaterally. Neck: no lymphadenopathy or mass    ASSESSMENT/PLAN:  Foreign body of right ear, initial encounter - piece of hearing aid removed with alligator forceps - Plan: PR RMVL FB XTRNL AUDITORY CANAL W/O ANES  Bilateral impacted cerumen - treated with ear lavage with good results.

## 2023-09-06 DIAGNOSIS — M47896 Other spondylosis, lumbar region: Secondary | ICD-10-CM | POA: Diagnosis not present

## 2023-09-06 DIAGNOSIS — M542 Cervicalgia: Secondary | ICD-10-CM | POA: Diagnosis not present

## 2023-09-06 DIAGNOSIS — M5412 Radiculopathy, cervical region: Secondary | ICD-10-CM | POA: Diagnosis not present

## 2023-09-06 DIAGNOSIS — M545 Low back pain, unspecified: Secondary | ICD-10-CM | POA: Diagnosis not present

## 2023-09-07 ENCOUNTER — Other Ambulatory Visit: Payer: Self-pay | Admitting: Internal Medicine

## 2023-09-11 ENCOUNTER — Encounter: Payer: Self-pay | Admitting: Internal Medicine

## 2023-09-12 DIAGNOSIS — I480 Paroxysmal atrial fibrillation: Secondary | ICD-10-CM | POA: Diagnosis not present

## 2023-09-12 DIAGNOSIS — R002 Palpitations: Secondary | ICD-10-CM | POA: Diagnosis not present

## 2023-09-13 ENCOUNTER — Telehealth: Payer: Self-pay | Admitting: Internal Medicine

## 2023-09-13 ENCOUNTER — Encounter: Payer: Self-pay | Admitting: Internal Medicine

## 2023-09-13 NOTE — Telephone Encounter (Signed)
 Pt c/o Shortness Of Breath: STAT if SOB developed within the last 24 hours or pt is noticeably SOB on the phone  1. Are you currently SOB (can you hear that pt is SOB on the phone)?  Daughter isn't currently with patient, but she says she's about 5 minutes from patient. She also sent a MyChart message regarding this.  2. How long have you been experiencing SOB?  Past few weeks off and on   3. Are you SOB when sitting or when up moving around? Both, but mainly when up and moving around  4. Are you currently experiencing any other symptoms?  HR is fluctuating.  Daughter gave example: 41, 85, 70, 89

## 2023-09-13 NOTE — Telephone Encounter (Signed)
 This has been addressed in a separate telephone encounter.  Please see that encounter for complete details.

## 2023-09-13 NOTE — Telephone Encounter (Signed)
 Followed up with dtr.  States that moms HRs are "jumping all around" and she is SOB.  (Dtr also sent a mychart message about this).  Says HRs are jumping from 60s - 80s. Aware that pt's monitor showed a 1% burden.   Advised to take pt to urgent care/ER if needed.  Advised to keep appt on Monday for further discussion.  Informed that not sure the SOB is heart related but can further discuss at her appt next week.  Dtr agreeable to plan and thanks me for talking with her about this.

## 2023-09-16 ENCOUNTER — Ambulatory Visit: Payer: Medicare HMO | Attending: Internal Medicine | Admitting: Internal Medicine

## 2023-09-16 ENCOUNTER — Encounter: Payer: Self-pay | Admitting: Internal Medicine

## 2023-09-16 VITALS — BP 110/66 | HR 69 | Ht 62.0 in | Wt 152.0 lb

## 2023-09-16 DIAGNOSIS — I480 Paroxysmal atrial fibrillation: Secondary | ICD-10-CM

## 2023-09-16 MED ORDER — FLECAINIDE ACETATE 50 MG PO TABS: 50.0000 mg | ORAL_TABLET | Freq: Two times a day (BID) | ORAL | 1 refills | Status: DC

## 2023-09-16 NOTE — Progress Notes (Signed)
 Patient Care Team: Joselyn Arrow, MD as PCP - General (Family Medicine) Duke Salvia, MD as PCP - Electrophysiology (Cardiology) Duke Salvia, MD as PCP - Cardiology (Cardiology) Verner Chol, [facility] (Inactive) as Pharmacist (Pharmacist) Shea Evans, MD as Consulting Physician (Obstetrics and Gynecology) Jene Every, MD as Consulting Physician (Orthopedic Surgery) Ollen Gross, MD as Consulting Physician (Orthopedic Surgery) Bradly Bienenstock, MD as Consulting Physician (Orthopedic Surgery) Mettu, Renee Harder, MD as Referring Physician (Ophthalmology)   HPI  Samantha Foley is a 89 y.o. female Seen in followup for paroxysmal atrial fibrillation of long-standing.  Anticoagulation with Apixoban (5 mg twice daily--cr < 1.5 and weight > 60 kg)  Seen by BO-NP early March after she had taken 7 flecainide by mistake.  Her QRS is widened.  At about 12 hours it had never dropped.  Her flecainide was stopped.  Since that she has had increasing palpitations and increasing shortness of breath.  Both with exertion as well as with rest an event recorder obtained shortly after the flecainide discontinuation demonstrated 4% PACs and less than 1% PVCs  No bleeding   The patient denies chest pain, nocturnal dyspnea, orthopnea or peripheral edema.  There have been no palpitations, lightheadedness or syncope.      DATE PR interval QRSduration PQRS Dose  6/21  156 78 234 0  5/23 178 86 264 50       Date Cr K Hgb  10/21 0.65 4.3 13.2   7/22 0.82 3.9 12.8  3/25 0.78 4.0 12.8       He called her [name]  She met him when she was 13     Past Medical History:  Diagnosis Date   Arthritis    Atherosclerosis of aorta (HCC)    noted on chest CT 10/2015   Atrial fibrillation (HCC)    reported but not yet documented   Baker cyst, left 06/2017   noted to have large, complex cyst on [place]   Chronic back pain    Dr. Shelle Iron   Chronic knee pain    Dr. Lequita Halt   Closed compression  fracture of body of L1 vertebra (HCC) 05/2009   vertebroplasty 06/02/2009   Closed compression fracture of L3 lumbar vertebra, initial encounter (HCC) 05/2015   vertebroplasty 07/01/2015   Colon polyp    Dr. Laural Benes   Coronary atherosclerosis    noted on chest CT 10/2015   Dysrhythmia    atrial fib- mild per pt- no meds   Food allergy    Scallops--no other seafood allergies   GERD (gastroesophageal reflux disease)    Granuloma annulare 09/2016   Dr. Hortense Ramal   Hiatal hernia    large, noted on EGD 01/14/13   Hyperlipidemia    Impaired glucose tolerance    Knee hemarthrosis, left 03/2017   Lactose intolerance    Macular degeneration    bilateral   Osteopenia    Osteoporosis    treated with Forteo in 2011 by Dr. 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 Shelle Iron, declines sx)   Spinal stenosis    has had injections   Vision problem    wears contacts    Past Surgical History:  Procedure Laterality Date   BACK SURGERY     several vertbea ae rcemntekd   CHOLECYSTECTOMY N/A 11/05/2018   Procedure:  LAPAROSCOPIC CHOLECYSTECTOMY;  Surgeon: Berna Bue, MD;  Location: [facility];  Service: General;  Laterality: N/A;   IR KYPHO LUMBAR INC FX REDUCE BONE BX UNI/BIL CANNULATION INC/IMAGING  03/05/2023   IR RADIOLOGIST EVAL & MGMT  01/17/2023   IR RADIOLOGIST EVAL & MGMT  03/19/2023   ROBOTIC ASSISTED TOTAL HYSTERECTOMY WITH BILATERAL SALPINGO OOPHERECTOMY Bilateral 03/08/2016   Procedure: ROBOTIC ASSISTED TOTAL HYSTERECTOMY WITH BILATERAL SALPINGO OOPHORECTOMY/Uterosacral Ligament Suspension;  Surgeon: Genia Del, MD;  Location: [facility];  Service: Gynecology;  Laterality: Bilateral;   sebaceous cyst excision  2009   neck   TONSILLECTOMY AND ADENOIDECTOMY  age 89   VERTEBROPLASTY  05/2009   L1   VERTEBROPLASTY  07/01/2015   L3   WISDOM  TOOTH EXTRACTION      Current Outpatient Medications  Medication Sig Dispense Refill   acetaminophen (TYLENOL) 500 MG tablet Take 1,000 mg by mouth every 6 (six) hours as needed.     ELIQUIS 5 MG TABS tablet TAKE 1 TABLET BY MOUTH TWICE DAILY 180 tablet 1   estradiol (ESTRACE) 0.1 MG/GM vaginal cream Place 1 Applicatorful vaginally 3 (three) times a week.     fluticasone (FLONASE) 50 MCG/ACT nasal spray Place 1 spray into both nostrils daily as needed for allergies or rhinitis.     HYDROcodone-acetaminophen (NORCO) 10-325 MG tablet Take 1 tablet by mouth every 4 (four) hours as needed.     loratadine (CLARITIN) 10 MG tablet Take 10 mg by mouth daily.     metoprolol tartrate (LOPRESSOR) 25 MG tablet Take 1 tablet (25 mg total) by mouth 2 (two) times daily. 90 tablet 3   Multiple Vitamin (MULTIVITAMIN WITH MINERALS) TABS tablet Take 1 tablet by mouth daily.     Multiple Vitamins-Minerals (ICAPS AREDS 2 PO) Take 1 capsule by mouth 2 (two) times a day.     omeprazole (PRILOSEC) 20 MG capsule Take 1 capsule (20 mg total) by mouth daily. 90 capsule 1   rosuvastatin (CRESTOR) 10 MG tablet Take 1 tablet (10 mg total) by mouth daily. 90 tablet 0   vitamin B-12 (CYANOCOBALAMIN) 1000 MCG tablet Take 1 tablet by mouth daily.     No current facility-administered medications for this visit.    Allergies  Allergen Reactions   Apixaban Other (See Comments)    fatigue  Other Reaction(s): confusion, Dizziness, Other (See Comments)  Fatigue   Ciprofloxacin Shortness Of Breath and Rash   Scallops [Shellfish Allergy] Anaphylaxis   Doxycycline Nausea And Vomiting and Nausea Only    Other Reaction(s): GI Intolerance, Headache   Penicillins Other (See Comments)    Did it involve swelling of the face/tongue/throat, SOB, or low BP? Unknown  Did it involve sudden or severe rash/hives, skin peeling, or any reaction on the inside of your mouth or nose? Unknown  Did you need to seek medical attention at a  hospital or doctor's office? Unknown  When did it last happen?        If all above answers are "NO", may proceed with cephalosporin use.  Other Reaction(s): Other (See Comments), Unknown  Childhood reaction unknown   Rivaroxaban Other (See Comments)    Pt unsure but d/c use  Other Reaction(s): Other (See Comments)  Reaction unknown   Demerol Other (See Comments)    Patient states it made her crazy and sleep for 12 hours   Milk-Related Compounds Diarrhea   Sudafed [Pseudoephedrine Hcl] Other (See Comments)    hyperactive    Review of Systems negative  except from HPI and PMH  Physical Exam BP 110/66   Pulse 69   Ht 5\' 2"  (1.575 m)   Wt 152 lb (68.9 kg)   SpO2 96%   BMI 27.80 kg/m  Well developed and nourished in no acute distress HENT normal Neck supple  Clear Regular rate and rhythm, no murmurs or gallops Abd-soft with active BS No Clubbing cyanosis edema Skin-warm and dry A & Oriented  Grossly normal sensory and motor function  ECG sinus @ 69 06/02/39     Assessment and Plan   Paroxysmal atrial fibrillation   Shortness of breath  PACs    The patient has had recurrent atrial fibrillation but not of late.  More recently following the episode with the flecainide overdose, it was stopped and her symptoms of shortness of breath have worsened considerably.  On examination she is having probably supraventricular trigeminy and quadrigeminy.  We will resume the flecainide 50 mg twice daily we will obtain an electrocardiogram in couple weeks.  If her dyspnea does not improve with this, we will need to think about all the things including an echocardiogram to look at LV function and probable HFpEF  Continue her on her Eliquis.  Hemoglobin is stable.  She needs neural axis injections for arthritis.  No prior stroke or Eliquis could be held as per protocol

## 2023-09-16 NOTE — Patient Instructions (Signed)
 Medication Instructions:  Your physician has recommended you make the following change in your medication:   ** Begin Flecainide 50mg  - 1 tablet by mouth twice daily.  *If you need a refill on your cardiac medications before your next appointment, please call your pharmacy*   Lab Work: None ordered.  If you have labs (blood work) drawn today and your tests are completely normal, you will receive your results only by: MyChart Message (if you have MyChart) OR A paper copy in the mail If you have any lab test that is abnormal or we need to change your treatment, we will call you to review the results.   Testing/Procedures: None ordered.    Follow-Up: At [facility], you and your health needs are our priority.  As part of our continuing mission to provide you with exceptional heart care, we have created designated Provider Care Teams.  These Care Teams include your primary Cardiologist (physician) and Advanced Practice Providers (APPs -  Physician Assistants and Nurse Practitioners) who all work together to provide you with the care you need, when you need it.  We recommend signing up for the patient portal called "MyChart".  Sign up information is provided on this After Visit Summary.  MyChart is used to connect with patients for Virtual Visits (Telemedicine).  Patients are able to view lab/test results, encounter notes, upcoming appointments, etc.  Non-urgent messages can be sent to your provider as well.   To learn more about what you can do with MyChart, go to [email or web address].    Your next appointment:    Nurse Visit in 1 week for ECG  9 weeks with Samantha Brim NP

## 2023-09-20 DIAGNOSIS — H353231 Exudative age-related macular degeneration, bilateral, with active choroidal neovascularization: Secondary | ICD-10-CM | POA: Diagnosis not present

## 2023-09-20 DIAGNOSIS — Z961 Presence of intraocular lens: Secondary | ICD-10-CM | POA: Diagnosis not present

## 2023-09-20 DIAGNOSIS — H353211 Exudative age-related macular degeneration, right eye, with active choroidal neovascularization: Secondary | ICD-10-CM | POA: Diagnosis not present

## 2023-09-20 DIAGNOSIS — H353122 Nonexudative age-related macular degeneration, left eye, intermediate dry stage: Secondary | ICD-10-CM | POA: Diagnosis not present

## 2023-09-20 DIAGNOSIS — D3132 Benign neoplasm of left choroid: Secondary | ICD-10-CM | POA: Diagnosis not present

## 2023-09-23 ENCOUNTER — Other Ambulatory Visit: Payer: Self-pay | Admitting: Family Medicine

## 2023-09-23 ENCOUNTER — Ambulatory Visit: Attending: Internal Medicine

## 2023-09-23 VITALS — BP 152/72 | HR 65 | Wt 152.8 lb

## 2023-09-23 DIAGNOSIS — I700 Atherosclerosis of aorta: Secondary | ICD-10-CM

## 2023-09-23 DIAGNOSIS — I480 Paroxysmal atrial fibrillation: Secondary | ICD-10-CM | POA: Diagnosis not present

## 2023-09-23 DIAGNOSIS — E7800 Pure hypercholesterolemia, unspecified: Secondary | ICD-10-CM

## 2023-09-23 NOTE — Progress Notes (Signed)
   Nurse Visit   Date of Encounter: 09/23/2023 ID: Samantha Foley, DOB Feb 14, 1930, MRN [id number]  PCP:  Joselyn Arrow, MD   [facility] Providers Cardiologist:  Sherryl Manges, MD Electrophysiologist:  Sherryl Manges, MD      Visit Details   VS:  BP (!) 152/72   Pulse 65   Wt 152 lb 12.8 oz (69.3 kg)   BMI 27.95 kg/m  , BMI Body mass index is 27.95 kg/m.  Wt Readings from Last 3 Encounters:  09/23/23 152 lb 12.8 oz (69.3 kg)  09/16/23 152 lb (68.9 kg)  09/04/23 151 lb 12.8 oz (68.9 kg)     Reason for visit: Flecainide Restart Performed today: Vital signs. EKG and consulted with Dr Leida Lauth Changes (medications, testing, etc.) : No changes - Continue Flecainide 50mg  - 1 tablet by mouth twice daily. Length of Visit: 30 minutes    Medications Adjustments/Labs and Tests Ordered: Orders Placed This Encounter  Procedures   EKG 12-Lead   No orders of the defined types were placed in this encounter.  Pt presents today for EKG due to restarting Flecainide.  Pt reports she is doing very well with medication.   Signed, Alois Cliche, RN  09/23/2023 3:53 PM

## 2023-09-23 NOTE — Patient Instructions (Signed)
 Medication Instructions:  Your physician recommends that you continue on your current medications as directed. Please refer to the Current Medication list given to you today.  *If you need a refill on your cardiac medications before your next appointment, please call your pharmacy*  Lab Work: None ordered.  If you have labs (blood work) drawn today and your tests are completely normal, you will receive your results only by: MyChart Message (if you have MyChart) OR A paper copy in the mail If you have any lab test that is abnormal or we need to change your treatment, we will call you to review the results.  Testing/Procedures: None ordered.   Follow-Up: At [facility], you and your health needs are our priority.  As part of our continuing mission to provide you with exceptional heart care, our providers are all part of one team.  This team includes your primary Cardiologist (physician) and Advanced Practice Providers or APPs (Physician Assistants and Nurse Practitioners) who all work together to provide you with the care you need, when you need it.  Your next appointment:   As planned  We recommend signing up for the patient portal called "MyChart".  Sign up information is provided on this After Visit Summary.  MyChart is used to connect with patients for Virtual Visits (Telemedicine).  Patients are able to view lab/test results, encounter notes, upcoming appointments, etc.  Non-urgent messages can be sent to your provider as well.   To learn more about what you can do with MyChart, go to [email or web address].      1st Floor: - Lobby - Registration  - Pharmacy  - Lab - Cafe  2nd Floor: - PV Lab - Diagnostic Testing (echo, CT, nuclear med)  3rd Floor: - Vacant  4th Floor: - TCTS (cardiothoracic surgery) - AFib Clinic - Structural Heart Clinic - Vascular Surgery  - Vascular Ultrasound  5th Floor: - HeartCare Cardiology (general and EP) - Clinical Pharmacy  for coumadin, hypertension, lipid, weight-loss medications, and med management appointments    Valet parking services will be available as well.

## 2023-09-28 ENCOUNTER — Encounter: Payer: Self-pay | Admitting: Internal Medicine

## 2023-09-28 DIAGNOSIS — I480 Paroxysmal atrial fibrillation: Secondary | ICD-10-CM

## 2023-09-30 MED ORDER — APIXABAN 5 MG PO TABS
5.0000 mg | ORAL_TABLET | Freq: Two times a day (BID) | ORAL | 1 refills | Status: DC
Start: 2023-09-30 — End: 2024-03-30

## 2023-09-30 NOTE — Telephone Encounter (Signed)
 Prescription refill request for Eliquis received. Indication: Afib  Last office visit: 09/16/23 Graciela Husbands)  Scr: 0.78 (08/26/23)  Age: 89 Weight: 69.3kg  Appropriate dose. Refill sent.

## 2023-10-15 DIAGNOSIS — M810 Age-related osteoporosis without current pathological fracture: Secondary | ICD-10-CM | POA: Diagnosis not present

## 2023-10-17 DIAGNOSIS — I480 Paroxysmal atrial fibrillation: Secondary | ICD-10-CM

## 2023-10-17 DIAGNOSIS — R0602 Shortness of breath: Secondary | ICD-10-CM

## 2023-10-17 DIAGNOSIS — R002 Palpitations: Secondary | ICD-10-CM | POA: Diagnosis not present

## 2023-11-04 DIAGNOSIS — H353231 Exudative age-related macular degeneration, bilateral, with active choroidal neovascularization: Secondary | ICD-10-CM | POA: Diagnosis not present

## 2023-11-12 DIAGNOSIS — M810 Age-related osteoporosis without current pathological fracture: Secondary | ICD-10-CM | POA: Diagnosis not present

## 2023-11-14 DIAGNOSIS — N952 Postmenopausal atrophic vaginitis: Secondary | ICD-10-CM | POA: Diagnosis not present

## 2023-11-14 DIAGNOSIS — N814 Uterovaginal prolapse, unspecified: Secondary | ICD-10-CM | POA: Diagnosis not present

## 2023-11-14 DIAGNOSIS — Z4689 Encounter for fitting and adjustment of other specified devices: Secondary | ICD-10-CM | POA: Diagnosis not present

## 2023-11-24 NOTE — Progress Notes (Deleted)
  Electrophysiology Office Note:   Date:  11/24/2023  ID:  Samantha Foley, DOB Nov 04, 1929, MRN [id number]  Primary Cardiologist: Richardo Chandler, MD Primary Heart Failure: None Electrophysiologist: Richardo Chandler, MD  {Click to update primary MD,subspecialty MD or APP then REFRESH:1}    History of Present Illness:   Samantha Foley is a 89 y.o. female with h/o AF seen today for routine electrophysiology followup.   Seen in March 2025 after an accidental flecainide  overdose. Flecainide  was stopped, she had recurrent ectopy / AF and it was re-started at 50 mg BID. In addition, she was short of breath.    Since last being seen in our clinic the patient reports doing ***.    She denies chest pain, palpitations, dyspnea, PND, orthopnea, nausea, vomiting, dizziness, syncope, edema, weight gain, or early satiety.   Review of systems complete and found to be negative unless listed in HPI.   EP Information / Studies Reviewed:    EKG is ordered today. Personal review as below.      Studies:  ECHO 10/2018 > LVEF 60-65%, RV systolic function mildly reduced, no evidence of MV stenosis, mild AV regurgitation   Arrhythmia / AAD AF Flecainide     Risk Assessment/Calculations:    CHA2DS2-VASc Score = 3  {Confirm score is correct.  If not, click here to update score.  REFRESH note.  :1} This indicates a 3.2% annual risk of stroke. The patient's score is based upon: CHF History: 0 HTN History: 0 Diabetes History: 0 Stroke History: 0 Vascular Disease History: 0 Age Score: 2 Gender Score: 1   {This patient has a significant risk of stroke if diagnosed with atrial fibrillation.  Please consider VKA or DOAC agent for anticoagulation if the bleeding risk is acceptable.   You can also use the SmartPhrase .[email or web address] for documentation.   :[id number]} No BP recorded.  {Refresh Note OR Click here to enter BP  :1}***        Physical Exam:   VS:  There were no vitals taken for this visit.   Wt  Readings from Last 3 Encounters:  09/23/23 152 lb 12.8 oz (69.3 kg)  09/16/23 152 lb (68.9 kg)  09/04/23 151 lb 12.8 oz (68.9 kg)     GEN: Well nourished, well developed in no acute distress NECK: No JVD; No carotid bruits CARDIAC: {EPRHYTHM:28826}, no murmurs, rubs, gallops RESPIRATORY:  Clear to auscultation without rales, wheezing or rhonchi  ABDOMEN: Soft, non-tender, non-distended EXTREMITIES:  No edema; No deformity   ASSESSMENT AND PLAN:    Paroxysmal Atrial Fibrillation  High Risk Medication Monitoring: Flecainide   PAC's  -OAC for stroke prophylaxis  -continue flecainide  50 mg BID  -EKG with stable intervals, NSR ***  -dyspnea improved??? If not consider ECHO ***  Secondary Hypercoagulable State  -continue Eliquis , dose reviewed and appropriate by ***   Follow up with Dr. Rodolfo Clan {EPFOLLOW NW:29562}  Creighton Doffing, NP-C, AGACNP-BC [facility] - Electrophysiology  11/24/2023, 9:30 PM

## 2023-11-25 ENCOUNTER — Ambulatory Visit: Admitting: Pulmonary Disease

## 2023-11-25 DIAGNOSIS — Z79899 Other long term (current) drug therapy: Secondary | ICD-10-CM

## 2023-11-25 DIAGNOSIS — D6869 Other thrombophilia: Secondary | ICD-10-CM

## 2023-11-25 DIAGNOSIS — I480 Paroxysmal atrial fibrillation: Secondary | ICD-10-CM

## 2023-12-05 NOTE — Progress Notes (Deleted)
  Electrophysiology Office Note:   Date:  12/05/2023  ID:  Samantha Foley, DOB 1930/03/11, MRN [id number]  Primary Cardiologist: Richardo Chandler, MD Primary Heart Failure: None Electrophysiologist: Richardo Chandler, MD  {Click to update primary MD,subspecialty MD or APP then REFRESH:1}    History of Present Illness:   Samantha Foley is a 89 y.o. female with h/o AF seen today for routine electrophysiology followup.   Seen in March 2025 after an accidental flecainide  overdose. Flecainide  was stopped, she had recurrent ectopy / AF and it was re-started at 50 mg BID. In addition, she was short of breath.    Since last being seen in our clinic the patient reports doing ***.    She denies chest pain, palpitations, dyspnea, PND, orthopnea, nausea, vomiting, dizziness, syncope, edema, weight gain, or early satiety.   Review of systems complete and found to be negative unless listed in HPI.   EP Information / Studies Reviewed:    EKG is ordered today. Personal review as below.      Studies:  ECHO 10/2018 > LVEF 60-65%, RV systolic function mildly reduced, no evidence of MV stenosis, mild AV regurgitation   Arrhythmia / AAD AF Flecainide     Risk Assessment/Calculations:    CHA2DS2-VASc Score = 3  {Confirm score is correct.  If not, click here to update score.  REFRESH note.  :1} This indicates a 3.2% annual risk of stroke. The patient's score is based upon: CHF History: 0 HTN History: 0 Diabetes History: 0 Stroke History: 0 Vascular Disease History: 0 Age Score: 2 Gender Score: 1   {This patient has a significant risk of stroke if diagnosed with atrial fibrillation.  Please consider VKA or DOAC agent for anticoagulation if the bleeding risk is acceptable.   You can also use the SmartPhrase .[email or web address] for documentation.   :[id number]} No BP recorded.  {Refresh Note OR Click here to enter BP  :1}***        Physical Exam:   VS:  There were no vitals taken for this visit.   Wt  Readings from Last 3 Encounters:  09/23/23 152 lb 12.8 oz (69.3 kg)  09/16/23 152 lb (68.9 kg)  09/04/23 151 lb 12.8 oz (68.9 kg)     GEN: Well nourished, well developed in no acute distress NECK: No JVD; No carotid bruits CARDIAC: {EPRHYTHM:28826}, no murmurs, rubs, gallops RESPIRATORY:  Clear to auscultation without rales, wheezing or rhonchi  ABDOMEN: Soft, non-tender, non-distended EXTREMITIES:  No edema; No deformity   ASSESSMENT AND PLAN:    Paroxysmal Atrial Fibrillation  High Risk Medication Monitoring: Flecainide   PAC's  -OAC for stroke prophylaxis  -continue flecainide  50 mg BID  -EKG with stable intervals, NSR ***  -dyspnea improved??? If not consider ECHO ***  Secondary Hypercoagulable State  -continue Eliquis , dose reviewed and appropriate by ***   Follow up with Dr. Rodolfo Clan {EPFOLLOW NW:29562}  Creighton Doffing, NP-C, AGACNP-BC [facility] - Electrophysiology  12/05/2023, 9:34 PM

## 2023-12-06 ENCOUNTER — Emergency Department (HOSPITAL_COMMUNITY)
Admission: EM | Admit: 2023-12-06 | Discharge: 2023-12-07 | Disposition: A | Discharge status: Home / Self Care | Attending: Emergency Medicine | Admitting: Emergency Medicine

## 2023-12-06 ENCOUNTER — Emergency Department (HOSPITAL_COMMUNITY)

## 2023-12-06 ENCOUNTER — Encounter (HOSPITAL_COMMUNITY): Payer: Self-pay

## 2023-12-06 ENCOUNTER — Other Ambulatory Visit: Payer: Self-pay

## 2023-12-06 ENCOUNTER — Ambulatory Visit: Admitting: Pulmonary Disease

## 2023-12-06 DIAGNOSIS — W19XXXA Unspecified fall, initial encounter: Secondary | ICD-10-CM | POA: Diagnosis not present

## 2023-12-06 DIAGNOSIS — Z7901 Long term (current) use of anticoagulants: Secondary | ICD-10-CM | POA: Insufficient documentation

## 2023-12-06 DIAGNOSIS — Y92019 Unspecified place in single-family (private) house as the place of occurrence of the external cause: Secondary | ICD-10-CM | POA: Insufficient documentation

## 2023-12-06 DIAGNOSIS — M85872 Other specified disorders of bone density and structure, left ankle and foot: Secondary | ICD-10-CM | POA: Diagnosis not present

## 2023-12-06 DIAGNOSIS — S39012A Strain of muscle, fascia and tendon of lower back, initial encounter: Secondary | ICD-10-CM | POA: Insufficient documentation

## 2023-12-06 DIAGNOSIS — D6869 Other thrombophilia: Secondary | ICD-10-CM

## 2023-12-06 DIAGNOSIS — M79672 Pain in left foot: Secondary | ICD-10-CM | POA: Insufficient documentation

## 2023-12-06 DIAGNOSIS — Z79899 Other long term (current) drug therapy: Secondary | ICD-10-CM

## 2023-12-06 DIAGNOSIS — M160 Bilateral primary osteoarthritis of hip: Secondary | ICD-10-CM | POA: Diagnosis not present

## 2023-12-06 DIAGNOSIS — M7732 Calcaneal spur, left foot: Secondary | ICD-10-CM | POA: Diagnosis not present

## 2023-12-06 DIAGNOSIS — M47816 Spondylosis without myelopathy or radiculopathy, lumbar region: Secondary | ICD-10-CM | POA: Diagnosis not present

## 2023-12-06 DIAGNOSIS — R002 Palpitations: Secondary | ICD-10-CM

## 2023-12-06 DIAGNOSIS — I10 Essential (primary) hypertension: Secondary | ICD-10-CM | POA: Diagnosis not present

## 2023-12-06 DIAGNOSIS — S3992XA Unspecified injury of lower back, initial encounter: Secondary | ICD-10-CM | POA: Diagnosis present

## 2023-12-06 DIAGNOSIS — R0689 Other abnormalities of breathing: Secondary | ICD-10-CM | POA: Diagnosis not present

## 2023-12-06 DIAGNOSIS — M545 Low back pain, unspecified: Secondary | ICD-10-CM | POA: Diagnosis not present

## 2023-12-06 DIAGNOSIS — W07XXXA Fall from chair, initial encounter: Secondary | ICD-10-CM | POA: Insufficient documentation

## 2023-12-06 DIAGNOSIS — I480 Paroxysmal atrial fibrillation: Secondary | ICD-10-CM

## 2023-12-06 DIAGNOSIS — M25552 Pain in left hip: Secondary | ICD-10-CM | POA: Diagnosis not present

## 2023-12-06 DIAGNOSIS — M25572 Pain in left ankle and joints of left foot: Secondary | ICD-10-CM | POA: Diagnosis not present

## 2023-12-06 MED ORDER — PANTOPRAZOLE SODIUM 40 MG PO TBEC
40.0000 mg | DELAYED_RELEASE_TABLET | Freq: Every day | ORAL | Status: DC
  Administered 2023-12-07: 40 mg via ORAL
  Filled 2023-12-06: qty 1

## 2023-12-06 MED ORDER — FENTANYL CITRATE PF 50 MCG/ML IJ SOSY
50.0000 ug | PREFILLED_SYRINGE | Freq: Once | INTRAMUSCULAR | Status: AC
  Administered 2023-12-06: 50 ug via INTRAVENOUS
  Filled 2023-12-06: qty 1

## 2023-12-06 MED ORDER — METOPROLOL TARTRATE 25 MG PO TABS
25.0000 mg | ORAL_TABLET | Freq: Two times a day (BID) | ORAL | Status: DC
  Administered 2023-12-06 – 2023-12-07 (×2): 25 mg via ORAL
  Filled 2023-12-06 (×2): qty 1

## 2023-12-06 MED ORDER — FLECAINIDE ACETATE 50 MG PO TABS
50.0000 mg | ORAL_TABLET | Freq: Two times a day (BID) | ORAL | Status: DC
  Administered 2023-12-06 – 2023-12-07 (×2): 50 mg via ORAL
  Filled 2023-12-06 (×2): qty 1

## 2023-12-06 MED ORDER — APIXABAN 5 MG PO TABS
5.0000 mg | ORAL_TABLET | Freq: Two times a day (BID) | ORAL | Status: DC
  Administered 2023-12-06 – 2023-12-07 (×2): 5 mg via ORAL
  Filled 2023-12-06 (×2): qty 1

## 2023-12-06 MED ORDER — HYDROCODONE-ACETAMINOPHEN 10-325 MG PO TABS
1.0000 | ORAL_TABLET | ORAL | Status: DC | PRN
  Administered 2023-12-06 – 2023-12-07 (×2): 1 via ORAL
  Filled 2023-12-06 (×2): qty 1

## 2023-12-06 MED ORDER — HYDROCODONE-ACETAMINOPHEN 5-325 MG PO TABS
1.0000 | ORAL_TABLET | Freq: Once | ORAL | Status: AC
  Administered 2023-12-06: 1 via ORAL
  Filled 2023-12-06: qty 1

## 2023-12-06 MED ORDER — SENNA 8.6 MG PO TABS
1.0000 | ORAL_TABLET | Freq: Every day | ORAL | Status: DC | PRN
  Administered 2023-12-06 – 2023-12-07 (×2): 8.6 mg via ORAL
  Filled 2023-12-06 (×2): qty 1

## 2023-12-06 MED ORDER — ROSUVASTATIN CALCIUM 5 MG PO TABS
10.0000 mg | ORAL_TABLET | Freq: Every day | ORAL | Status: DC
  Administered 2023-12-07: 10 mg via ORAL
  Filled 2023-12-06: qty 2

## 2023-12-06 NOTE — ED Notes (Signed)
 Family requested ice pack for pain in ankle, family stated that patient did not want pain medicine.

## 2023-12-06 NOTE — ED Triage Notes (Signed)
 Patient BIB [facility] from home after stumbling with her walker and landing on her left side. Patient did not hit her head. EMS reports L posterior hip tenderness and pain, no shortening and rotation. 10/10 pain initially, down to 0 after 2 doses of 50mcg fentanyl . BP 192/100 HR 62 RR 18 EtCo2 34 99% RA

## 2023-12-06 NOTE — ED Provider Notes (Signed)
 [place] EMERGENCY DEPARTMENT AT [facility] Provider Note   CSN: [id number] Arrival date & time: 12/06/23  1620     Patient presents with: Samantha Foley   Samantha Foley is a 89 y.o. female.  She is brought in by EMS after a fall at home.  She said she was getting up out of her chair and she fell landing on her left side.  EMS reported significant left hip pain.  Currently she is complaining of a little bit of low back pain and significant pain in her left ankle and foot.  She feels that it is difficult to move her foot.  She said the pain was initially 10 out of 10 but is down to 0 after 2 doses of fentanyl  by EMS.  She denies hitting her head or any loss of consciousness.  She is not exactly sure why she fell but she thinks she may have tripped over her walker.  She denies hitting her head or neck, is on a blood thinner.  No loss of consciousness.  {Add pertinent medical, surgical, social history, OB history to UUV:25366} The history is provided by the patient.  Fall This is a new problem. The current episode started 1 to 2 hours ago. Pertinent negatives include no chest pain, no abdominal pain and no shortness of breath. The symptoms are aggravated by bending and twisting. She has tried rest for the symptoms. The treatment provided no relief.       Prior to Admission medications   Medication Sig Start Date End Date Taking? Authorizing Provider  acetaminophen  (TYLENOL ) 500 MG tablet Take 1,000 mg by mouth every 6 (six) hours as needed.    [provider]  apixaban  (ELIQUIS ) 5 MG TABS tablet Take 1 tablet (5 mg total) by mouth 2 (two) times daily. 09/30/23   Verona Goodwill, MD  estradiol (ESTRACE) 0.1 MG/GM vaginal cream Place 1 Applicatorful vaginally 3 (three) times a week. 03/21/23   [provider]  flecainide  (TAMBOCOR ) 50 MG tablet Take 1 tablet (50 mg total) by mouth 2 (two) times daily. 09/16/23   Verona Goodwill, MD  fluticasone  (FLONASE ) 50 MCG/ACT nasal  spray Place 1 spray into both nostrils daily as needed for allergies or rhinitis.    [provider]  HYDROcodone -acetaminophen  (NORCO) 10-325 MG tablet Take 1 tablet by mouth every 4 (four) hours as needed. 07/26/23   [provider]  loratadine (CLARITIN) 10 MG tablet Take 10 mg by mouth daily.    [provider]  metoprolol  tartrate (LOPRESSOR ) 25 MG tablet Take 1 tablet (25 mg total) by mouth 2 (two) times daily. 09/09/23   Verona Goodwill, MD  Multiple Vitamin (MULTIVITAMIN WITH MINERALS) TABS tablet Take 1 tablet by mouth daily.    [provider]  Multiple Vitamins-Minerals (ICAPS AREDS 2 PO) Take 1 capsule by mouth 2 (two) times a day.    [provider]  omeprazole  (PRILOSEC) 20 MG capsule Take 1 capsule (20 mg total) by mouth daily. 07/29/23   Roosvelt Colla, MD  rosuvastatin  (CRESTOR ) 10 MG tablet TAKE 1 TABLET BY MOUTH EVERY DAY 09/23/23   Roosvelt Colla, MD  vitamin B-12 (CYANOCOBALAMIN) 1000 MCG tablet Take 1 tablet by mouth daily.    [provider]    Allergies: Apixaban , Ciprofloxacin, Scallops [shellfish allergy], Doxycycline , Penicillins, Rivaroxaban , Demerol, Milk-related compounds, and Sudafed [pseudoephedrine hcl]    Review of Systems  Constitutional:  Negative for fever.  HENT:  Negative for sore throat.  Respiratory:  Negative for shortness of breath.   Cardiovascular:  Negative for chest pain.  Gastrointestinal:  Negative for abdominal pain.  Genitourinary:  Negative for dysuria.  Musculoskeletal:  Positive for back pain. Negative for neck pain.  Skin:  Negative for rash.    Updated Vital Signs BP (!) 148/63 (BP Location: Right Arm)   Pulse 63   Temp 98.7 F (37.1 C) (Oral)   Resp 18   SpO2 100%   Physical Exam Vitals and nursing note reviewed.  Constitutional:      General: She is not in acute distress.    Appearance: Normal appearance. She is well-developed.  HENT:     Head: Normocephalic and atraumatic.    Eyes:     Conjunctiva/sclera: Conjunctivae normal.    Cardiovascular:     Rate and Rhythm: Normal rate and regular rhythm.     Heart sounds: No murmur heard. Pulmonary:     Effort: Pulmonary effort is normal. No respiratory distress.     Breath sounds: Normal breath sounds. No stridor. No wheezing.  Abdominal:     Palpations: Abdomen is soft.     Tenderness: There is no abdominal tenderness. There is no guarding or rebound.   Musculoskeletal:        General: Tenderness present. No deformity. Normal range of motion.     Cervical back: Neck supple.   Skin:    General: Skin is warm and dry.     Comments: She has full range of motion and nontender bilateral upper extremities.  Right lower extremity full range of motion without any pain or limitations.  No left hip tenderness or knee tenderness.  Moderate ankle tenderness and foot tenderness.  She has no thoracic tenderness but does have some mild lumbar tenderness.  No significant bruising or swelling.  Distal pulses intact.   Neurological:     General: No focal deficit present.     Mental Status: She is alert.     GCS: GCS eye subscore is 4. GCS verbal subscore is 5. GCS motor subscore is 6.     Sensory: No sensory deficit.     Motor: No weakness.     (all labs ordered are listed, but only abnormal results are displayed) Labs Reviewed - No data to display  EKG: None  Radiology: No results found.  {Document cardiac monitor, telemetry assessment procedure when appropriate:32947} Procedures   Medications Ordered in the ED - No data to display    {Click here for ABCD2, HEART and other calculators REFRESH Note before signing:1}                              Medical Decision Making Amount and/or Complexity of Data Reviewed Radiology: ordered.   This patient complains of ***; this involves an extensive number of treatment Options and is a complaint that carries with it a high risk of complications and morbidity. The  differential includes ***  I ordered, reviewed and interpreted labs, which included *** I ordered medication *** and reviewed PMP when indicated. I ordered imaging studies which included *** and I independently    visualized and interpreted imaging which showed *** Additional history obtained from *** Previous records obtained and reviewed *** I consulted *** and discussed lab and imaging findings and discussed disposition.  Cardiac monitoring reviewed, *** Social determinants considered, *** Critical Interventions: ***  After the interventions stated above, I reevaluated the patient and found *** Admission  and further testing considered, ***   {Document critical care time when appropriate  Document review of labs and clinical decision tools ie CHADS2VASC2, etc  Document your independent review of radiology images and any outside records  Document your discussion with family members, caretakers and with consultants  Document social determinants of health affecting pt's care  Document your decision making why or why not admission, treatments were needed:32947:::1}   Final diagnoses:  None    ED Discharge Orders     None

## 2023-12-07 MED ORDER — NALOXONE HCL 4 MG/0.1ML NA LIQD: 1.0000 | NASAL | Status: DC | PRN

## 2023-12-07 MED ORDER — HYDROCODONE-ACETAMINOPHEN 5-325 MG PO TABS: 1.0000 | ORAL_TABLET | Freq: Four times a day (QID) | ORAL | 0 refills | Status: AC | PRN

## 2023-12-07 MED ORDER — LIDOCAINE 5 % EX OINT: 1.0000 | TOPICAL_OINTMENT | Freq: Three times a day (TID) | CUTANEOUS | 0 refills | Status: DC | PRN

## 2023-12-07 MED ORDER — SENNOSIDES-DOCUSATE SODIUM 8.6-50 MG PO TABS: 1.0000 | ORAL_TABLET | Freq: Every evening | ORAL | 0 refills | Status: DC | PRN

## 2023-12-07 NOTE — Progress Notes (Signed)
 CSW spoke with patient and her daughter Samantha Foley at bedside. CSW told patient that physical therapy will come see her to do an evaluation prior to discharge. The patient stated that she would like to discharge home with home health. The family at this time does not want skilled nursing placement if its recommended.

## 2023-12-07 NOTE — ED Provider Notes (Signed)
 Emergency Medicine Observation Re-evaluation Note  Samantha Foley is a 89 y.o. female, seen on rounds today.  Pt initially presented to the ED for complaints of Fall Currently, the patient is boarding in the ED.  Physical Exam  BP 117/73 (BP Location: Right Arm)   Pulse 61   Temp 98.4 F (36.9 C) (Oral)   Resp 16   SpO2 98%  Physical Exam General: Calm Cardiac: Well perfused Lungs: Even respirations Psych: Calm  ED Course / MDM  EKG:   I have reviewed the labs performed to date as well as medications administered while in observation.  Recent changes in the last 24 hours include TOC/PT evaluated and patient stable for discharge with home health. Family ok with discharge. Face to face order placed.   Plan  Current plan is for d/c with home health.    Roberts Ching, MD 12/07/23 [phone]

## 2023-12-07 NOTE — Discharge Instructions (Signed)
 Please take your home medication as prescribed. We have ordered home health for additional assistance. Please call your PCP for an ASAP follow up appointment.

## 2023-12-07 NOTE — Evaluation (Signed)
 Physical Therapy Evaluation Patient Details Name: Samantha Foley MRN: [id number] DOB: 02-29-1930 Today's Date: 12/07/2023  History of Present Illness  Pt is a 89yo female brought to ED s/p fall at home presenting with L ankle/foot pain. PMH: nothing documented  Clinical Impression  Pt admitted with above. Daughter present at bedside. Pt and dtr educated on proper sequencing of RW and step to gait pattern leading with L LE. Both appreciative of education and patient reports this is the best it's felt. Went over proper fit of L foot air cast and how to properly elevate L foot at rest. Pt with good home set up and support. Pt to benefit from HHPT to address L foot pain and weakness and progress towards indep function. Acute PT to cont to follow.        If plan is discharge home, recommend the following: A little help with walking and/or transfers;A little help with bathing/dressing/bathroom;Help with stairs or ramp for entrance;Supervision due to cognitive status;Assistance with feeding   Can travel by private vehicle        Equipment Recommendations None recommended by PT  Recommendations for Other Services       Functional Status Assessment Patient has had a recent decline in their functional status and demonstrates the ability to make significant improvements in function in a reasonable and predictable amount of time.     Precautions / Restrictions Precautions Precautions: Fall Required Braces or Orthoses: Splint/Cast Splint/Cast: L ankle aircast Splint/Cast - Date Prophylactic Dressing Applied (if applicable): 12/06/23 Restrictions Weight Bearing Restrictions Per Provider Order: Yes LLE Weight Bearing Per Provider Order: Weight bearing as tolerated      Mobility  Bed Mobility Overal bed mobility: Needs Assistance Bed Mobility: Supine to Sit, Sit to Supine     Supine to sit: Contact guard Sit to supine: Min assist   General bed mobility comments: minA for L LE  management back into bed    Transfers Overall transfer level: Needs assistance Equipment used: Rolling walker (2 wheels) Transfers: Sit to/from Stand Sit to Stand: Contact guard assist           General transfer comment: verbal cues to push up from bed, increased time, increased weight on R LE    Ambulation/Gait Ambulation/Gait assistance: Contact guard assist Gait Distance (Feet): 10 Feet Assistive device: Rolling walker (2 wheels) Gait Pattern/deviations: Step-to pattern, Decreased stance time - left Gait velocity: dec     General Gait Details: pt educated on proper sequencing of using RW ( walker, L LE, weight through UEs and then stepping with R LE) patient and daughter very appreciative and reports feeling much more comfortable walking and minimal pain through L LE  Stairs            Wheelchair Mobility     Tilt Bed    Modified Rankin (Stroke Patients Only)       Balance Overall balance assessment: Needs assistance Sitting-balance support: Feet supported, Single extremity supported Sitting balance-Leahy Scale: Fair     Standing balance support: Bilateral upper extremity supported, During functional activity, Reliant on assistive device for balance Standing balance-Leahy Scale: Zero Standing balance comment: reliant on external support                             Pertinent Vitals/Pain Pain Assessment Pain Assessment: 0-10 Pain Score: 5  Pain Location: L ankle/foot Pain Descriptors / Indicators: Constant, Throbbing Pain Intervention(s): Premedicated before session, Ice applied (  ice applied post session, pt wore ankle boot)    Home Living Family/patient expects to be discharged to:: Private residence Living Arrangements: Alone (dtr reports family to stay with her) Available Help at Discharge: Family;Available 24 hours/day Type of Home: House Home Access: Stairs to enter Entrance Stairs-Rails: None Entrance Stairs-Number of Steps: 1    Home Layout: One level Home Equipment: BSC/3in1;Shower seat;Grab bars - toilet;Hand held Programmer, systems (2 wheels)      Prior Function Prior Level of Function : Independent/Modified Independent             Mobility Comments: has used RW since inital fall 3 weeks ago ADLs Comments: has been indep until recent fall     Extremity/Trunk Assessment   Upper Extremity Assessment Upper Extremity Assessment: Generalized weakness    Lower Extremity Assessment Lower Extremity Assessment: LLE deficits/detail LLE Deficits / Details: minimal active ROM at ankle and toes due to pain    Cervical / Trunk Assessment Cervical / Trunk Assessment: Normal  Communication   Communication Communication: No apparent difficulties    Cognition Arousal: Alert Behavior During Therapy: WFL for tasks assessed/performed                           PT - Cognition Comments: dtr present, pt with difficulty comprehending pain scale and required visual cues/demonstration on how to use RW to off-weight L LE during amb Following commands: Impaired Following commands impaired: Follows one step commands with increased time, Follows multi-step commands with increased time     Cueing Cueing Techniques: Verbal cues, Gestural cues, Visual cues     General Comments General comments (skin integrity, edema, etc.): L ankle with mild swelling and bruising    Exercises Other Exercises Other Exercises: wiggling toes and attempts to ankle DF/PF to comfort   Assessment/Plan    PT Assessment Patient needs continued PT services  PT Problem List Decreased strength;Decreased range of motion;Decreased activity tolerance;Decreased balance;Decreased mobility;Decreased knowledge of use of DME;Pain       PT Treatment Interventions DME instruction;Gait training;Stair training;Functional mobility training;Therapeutic activities;Therapeutic exercise    PT Goals (Current goals can be found in the Care  Plan section)  Acute Rehab PT Goals Patient Stated Goal: home PT Goal Formulation: With patient/family Time For Goal Achievement: 12/21/23 Potential to Achieve Goals: Good    Frequency Min 3X/week     Co-evaluation               AM-PAC PT 6 Clicks Mobility  Outcome Measure Help needed turning from your back to your side while in a flat bed without using bedrails?: A Little Help needed moving from lying on your back to sitting on the side of a flat bed without using bedrails?: A Little Help needed moving to and from a bed to a chair (including a wheelchair)?: A Little Help needed standing up from a chair using your arms (e.g., wheelchair or bedside chair)?: A Little Help needed to walk in hospital room?: A Little Help needed climbing 3-5 steps with a railing? : A Lot 6 Click Score: 17    End of Session Equipment Utilized During Treatment: Gait belt Activity Tolerance: Patient tolerated treatment well Patient left: in bed;with call bell/phone within reach;with family/visitor present   PT Visit Diagnosis: Unsteadiness on feet (R26.81);Pain Pain - Right/Left: Left Pain - part of body: Ankle and joints of foot    Time: 7829-5621 PT Time Calculation (min) (ACUTE ONLY): 36 min  Charges:   PT Evaluation $PT Eval Moderate Complexity: 1 Mod PT Treatments $Gait Training: 8-22 mins PT General Charges $$ ACUTE PT VISIT: 1 Visit         Renaee Caro, PT, DPT Acute Rehabilitation Services Secure chat preferred Office #: [phone]   Jenna Moan 12/07/2023, 12:03 PM

## 2023-12-07 NOTE — ED Notes (Addendum)
 Pt called out to use restroom. This RN asked pt if she would rather use a bedside commode so she does not have to walk as far to use it. Pt agreed and this RN brought a bedside commode into pt room. Pt was able to stand with the help of this RN and use of the walker. This RN pulled pt pants down while pt used walker to help stand. After she was done, pt then stood up and this RN stood behind pt to pull up her pants. Pt then voiced her pants were stuck on her foot. When pt lifted her foot up, she lost her balance and fell into this RN. Pt was then assisted into a sitting position onto the floor by this RN. Pt then voiced that she was okay, no injuries and no pain anywhere. This RN and another RN helped pt up into a standing position and back into the bed. Pt then voiced that she felt okay and was not injured anywhere. Keneth Pay PA and [employer] RN was made aware of incident as well. Pt asked for a cup of water. Voiced no other needs at this time. Call light within reach and pt on monitor.

## 2023-12-07 NOTE — Care Management (Addendum)
 Transition of Care [facility]) - Emergency Department Mini Assessment   Patient Details  Name: Samantha Foley MRN: [id number] Date of Birth: 07-29-1929  [facility] CM/SW Contact:    Ronni Colace, RN Phone Number: 12/07/2023, 12:26 PM   Clinical Narrative:  Patient presented with a fall stumbled with her walker and landed on her side. C/o hip pain. Boarded overnight for PT assessment. Recommendation is HH. Reached out to previous [facility] agency [facility] for acceptance. [facility] called back and accepted for a 72 hour start. Orders in  patient can DC back to home Spoke with patient at bedside, No DME needed ,the [facility] agency will cal them to set up services/ ED Mini Assessment: What brought you to the Emergency Department? : Fall Hip pain  Barriers to Discharge: ED No Barriers  Barrier interventions: PT consult HH     Interventions which prevented an admission or readmission: Home Health Consult or Services    Patient Contact and Communications        ,                 Admission diagnosis:  fall; hip pain Patient Active Problem List   Diagnosis Date Noted   Pain and swelling of right upper extremity 09/19/2022   Right hip pain 09/19/2022   Secondary hypercoagulable state (HCC) 03/10/2022   Aortic atherosclerosis (HCC) 09/20/2019   Chronic right shoulder pain 09/20/2019   Insomnia 09/20/2019   Osteoporosis without current pathological fracture 09/20/2019   History of vertebroplasty 09/20/2019   Cholelithiasis with choledocholithiasis 11/06/2018   Cholangitis 11/01/2018   Sepsis (HCC) 11/01/2018   Encounter for therapeutic drug monitoring 04/15/2017   S/P total hysterectomy - Davinci 03/09/2016   Postoperative state 03/08/2016   Age-related macular degeneration 06/28/2015   Disorder of rotator cuff 06/28/2015   Degenerative drusen 07/11/2014   Chest pain 12/16/2013   Shoulder injury 12/18/2012   Lipoma of neck 02/27/2012   GERD  (gastroesophageal reflux disease) 08/08/2011   Impaired fasting glucose 08/08/2011   Pure hypercholesterolemia 08/08/2011   Osteopenia 08/08/2011   Atrial fibrillation (HCC) 08/08/2011   PCP:  Roosvelt Colla, MD Pharmacy:   [facility] [phone] - [place], [place] - [address] [place] [address] Phone: [phone] Fax: [phone]  [facility] - [place], [place] - [address] [place] [address] [place] [address] [place] [address] Phone: [phone] Fax: [phone]  [facility] - [place], [place] - [address] AT Portal to [facility] [address] [place] [address] Phone: [phone] Fax: [phone]

## 2023-12-07 NOTE — ED Notes (Signed)
 Patient IV was removed, pressure was applied to the area, with gauze and tape. Rn was in the room and patient site started bleeding, more pressure was applied to the site. Blood was on her clothes top and bra. Before patient left the site was not bleeding. She was instructed because she is on the Eliquis  whenever she gets  home to keep the pressure gauze on for one to two hours.

## 2023-12-09 ENCOUNTER — Telehealth: Payer: Self-pay | Admitting: Internal Medicine

## 2023-12-09 NOTE — Telephone Encounter (Signed)
 Looks like she went to ER after a fall on 6/13, and was discharged for home health PT. Guessing this is stating a delay in starting care until 6/20.  Doesn't say why. I wasn't even aware of this ER visit. Is this a message for someone to call them back?  The intention of this message isn't clear.

## 2023-12-09 NOTE — Telephone Encounter (Unsigned)
 Copied from CRM [id number]. Topic: Clinical - Home Health Verbal Orders >> Dec 09, 2023  2:09 PM Rosaria Common wrote: Caller/Agency: [facility] [place] Number: [phone] Service Requested: Skilled Nursing  Any new concerns about the patient? Yes Delay of start 6/20.

## 2023-12-10 ENCOUNTER — Other Ambulatory Visit: Payer: Self-pay | Admitting: Orthopaedic Surgery

## 2023-12-10 DIAGNOSIS — S8265XA Nondisplaced fracture of lateral malleolus of left fibula, initial encounter for closed fracture: Secondary | ICD-10-CM

## 2023-12-10 DIAGNOSIS — S8265XD Nondisplaced fracture of lateral malleolus of left fibula, subsequent encounter for closed fracture with routine healing: Secondary | ICD-10-CM | POA: Diagnosis not present

## 2023-12-10 NOTE — Telephone Encounter (Signed)
 Samantha Foley requested a delay in start for pt.   Gave her the ok to delay

## 2023-12-11 ENCOUNTER — Telehealth: Payer: Self-pay

## 2023-12-11 DIAGNOSIS — S8265XA Nondisplaced fracture of lateral malleolus of left fibula, initial encounter for closed fracture: Secondary | ICD-10-CM | POA: Diagnosis not present

## 2023-12-11 NOTE — Telephone Encounter (Signed)
 Copied from CRM [id number]. Topic: Clinical - Home Health Verbal Orders >> Dec 11, 2023 11:27 AM Crispin Dolphin wrote: Caller/Agency: Holli Lunger RN With [facility] Callback Number: [phone] Service Requested: Moving start date for Home Health Evaluation to Monday - was originally supposed to be today but they only had early morning. No call back needed unless provider has questions/concerns Frequency: N/A Any new concerns about the patient? No

## 2023-12-15 DIAGNOSIS — Z9181 History of falling: Secondary | ICD-10-CM | POA: Diagnosis not present

## 2023-12-15 DIAGNOSIS — S335XXD Sprain of ligaments of lumbar spine, subsequent encounter: Secondary | ICD-10-CM | POA: Diagnosis not present

## 2023-12-15 DIAGNOSIS — Z556 Problems related to health literacy: Secondary | ICD-10-CM | POA: Diagnosis not present

## 2023-12-15 DIAGNOSIS — E785 Hyperlipidemia, unspecified: Secondary | ICD-10-CM | POA: Diagnosis not present

## 2023-12-15 DIAGNOSIS — K5900 Constipation, unspecified: Secondary | ICD-10-CM | POA: Diagnosis not present

## 2023-12-15 DIAGNOSIS — Z7901 Long term (current) use of anticoagulants: Secondary | ICD-10-CM | POA: Diagnosis not present

## 2023-12-15 DIAGNOSIS — R32 Unspecified urinary incontinence: Secondary | ICD-10-CM | POA: Diagnosis not present

## 2023-12-15 DIAGNOSIS — K219 Gastro-esophageal reflux disease without esophagitis: Secondary | ICD-10-CM | POA: Diagnosis not present

## 2023-12-15 DIAGNOSIS — S93602D Unspecified sprain of left foot, subsequent encounter: Secondary | ICD-10-CM | POA: Diagnosis not present

## 2023-12-15 DIAGNOSIS — M6289 Other specified disorders of muscle: Secondary | ICD-10-CM | POA: Diagnosis not present

## 2023-12-15 DIAGNOSIS — Z79899 Other long term (current) drug therapy: Secondary | ICD-10-CM | POA: Diagnosis not present

## 2023-12-16 ENCOUNTER — Telehealth: Payer: Self-pay

## 2023-12-16 NOTE — Telephone Encounter (Signed)
 Copied from CRM [id number]. Topic: Clinical - Home Health Verbal Orders >> Dec 16, 2023  1:10 PM Gustabo D wrote: Caller/Agency: Sharlet Cleaves [facility] Callback Number: [phone] Service Requested: Skilled Nursing Frequency: 1 week 4, 1 month 1, 2 PRN Any new concerns about the patient? No, but she had a fall and has a sprain Will follow her for hypertension, heart disease and medication management.

## 2023-12-17 ENCOUNTER — Telehealth: Payer: Self-pay | Admitting: *Deleted

## 2023-12-17 NOTE — Telephone Encounter (Signed)
 Copied from CRM [id number]. Topic: Clinical - Home Health Verbal Orders >> Dec 17, 2023  2:56 PM Willma R wrote: Caller/Agency: Signe GLENWOOD [place] Callback Number: [phone] Service Requested: Occupational Therapy Frequency: 2 times a week for 2 weeks then 1 time a week for 1 week Any new concerns about the patient? No   Are these okay?

## 2023-12-18 ENCOUNTER — Telehealth: Payer: Self-pay | Admitting: *Deleted

## 2023-12-18 DIAGNOSIS — R296 Repeated falls: Secondary | ICD-10-CM

## 2023-12-18 DIAGNOSIS — I480 Paroxysmal atrial fibrillation: Secondary | ICD-10-CM

## 2023-12-18 DIAGNOSIS — M810 Age-related osteoporosis without current pathological fracture: Secondary | ICD-10-CM

## 2023-12-18 NOTE — Telephone Encounter (Signed)
 Copied from CRM [id number]. Topic: General - Other >> Dec 18, 2023  3:15 PM Cristopher B wrote: Reason for CRM: Shar[facility] Cleaves RN [facility] called in to see if someone can respond to the verbal order that was made on 12/16/2023 in the patients chart . Call back number is [phone]  She is calling back on this... Copied.. Copied from CRM [id number]. Topic: Clinical - Home Health Verbal Orders >> Dec 16, 2023  1:10 PM Gustabo D wrote: Caller/Agency: [facility] Callback Number: [phone] Service Requested: Skilled Nursing Frequency: 1 week 4, 1 month 1, 2 PRN Any new concerns about the patient? No, but she had a fall and has a sprain Will follow her for hypertension, heart disease and medication management.  Is this okay?

## 2023-12-18 NOTE — Telephone Encounter (Signed)
 I have a hard time following these messages. She had a fall and an ER visit, with no follow-up with me. I didn't order any of this. Pretty sure the plan was for [facility] physical therapy, per ER notes I'm fine with her getting the therapy to get her better and stronger, to recover from her fall. Is this requesting more chronic care management? (HTN, heart disease and med management?)--if so, why do we need [facility] doing this, we can have [facility] (our own nurses) help with this. Patient should be asked about this, and I think she is a candidate for [facility] referral for nursing given her afib, osteoporosis, macular degeneration, falls,

## 2023-12-18 NOTE — Telephone Encounter (Signed)
 Verbal orders given

## 2023-12-19 NOTE — Telephone Encounter (Signed)
 I put in [facility] order

## 2023-12-19 NOTE — Telephone Encounter (Signed)
 Patient ok for referral. She was wanted me to thank you for this suggestion.

## 2023-12-20 ENCOUNTER — Telehealth: Payer: Self-pay

## 2023-12-20 NOTE — Progress Notes (Unsigned)
 Complex Care Management Note Care Guide Note  12/20/2023 Name: Samantha Foley MRN: [id number] DOB: Apr 02, 1930   Complex Care Management Outreach Attempts: An unsuccessful telephone outreach was attempted today to offer the patient information about available complex care management services.  Follow Up Plan:  Additional outreach attempts will be made to offer the patient complex care management information and services.   Encounter Outcome:  No Answer  Leotis Rase [facility], [facility] Guide  Direct Dial: [phone]  Fax [phone]

## 2023-12-23 NOTE — Progress Notes (Signed)
 Complex Care Management Note  Care Guide Note 12/23/2023 Name: Samantha Foley MRN: [id number] DOB: 09-May-1930  Samantha Foley is a 89 y.o. year old female who sees Randol Dawes, MD for primary care. I reached out to Meade JINNY Norton by phone today to offer complex care management services.  [name] was given information about Complex Care Management services today including:   The Complex Care Management services include support from the care team which includes your Nurse Care Manager, Clinical Social Worker, or Pharmacist.  The Complex Care Management team is here to help remove barriers to the health concerns and goals most important to you. Complex Care Management services are voluntary, and the patient may decline or stop services at any time by request to their care team member.   Complex Care Management Consent Status: Patient agreed to services and verbal consent obtained.   Follow up plan:  Telephone appointment with complex care management team member scheduled for:  01-23-24  Encounter Outcome:  Patient Scheduled  Leotis Rase [facility], [facility] Guide  Direct Dial: [phone]  Fax [phone]

## 2023-12-30 DIAGNOSIS — H353231 Exudative age-related macular degeneration, bilateral, with active choroidal neovascularization: Secondary | ICD-10-CM | POA: Diagnosis not present

## 2024-01-02 ENCOUNTER — Ambulatory Visit: Admitting: Family Medicine

## 2024-01-05 NOTE — Progress Notes (Unsigned)
 No chief complaint on file.  Insurance requires FTF visit to continue to get physical therapy. She was last seen by me in 08/2023. She fell at home in 11/2023, and injured her left foot/ankle.  She previously had L distal fibular fracture (06/2021).  ER arranged for hom PT. She followed up with Dr. Barton, who ordered CT: Impressions  12/12/2023 12:28 PM EDT  1.  Medial talar dome osteochondral lesion, with approximately 1 mm subchondral collapse. 2.  Diffuse osteopenia decreases sensitivity for nondisplaced fracture. 3.  No acute displaced fractures. 4.  Several remote fractures as detailed above.       PMH, PSH, SH reviewed   ROS:    PHYSICAL EXAM:  There were no vitals taken for this visit.  Wt Readings from Last 3 Encounters:  09/23/23 152 lb 12.8 oz (69.3 kg)  09/16/23 152 lb (68.9 kg)  09/04/23 151 lb 12.8 oz (68.9 kg)       ASSESSMENT/PLAN:  Telephone appointment with complex care management team member scheduled for:  01-23-24

## 2024-01-06 ENCOUNTER — Ambulatory Visit (INDEPENDENT_AMBULATORY_CARE_PROVIDER_SITE_OTHER): Admitting: Family Medicine

## 2024-01-06 ENCOUNTER — Encounter: Payer: Self-pay | Admitting: Family Medicine

## 2024-01-06 VITALS — BP 100/60 | HR 62 | Wt 152.4 lb

## 2024-01-06 DIAGNOSIS — R296 Repeated falls: Secondary | ICD-10-CM

## 2024-01-06 DIAGNOSIS — M79672 Pain in left foot: Secondary | ICD-10-CM

## 2024-01-06 DIAGNOSIS — I480 Paroxysmal atrial fibrillation: Secondary | ICD-10-CM | POA: Diagnosis not present

## 2024-01-06 DIAGNOSIS — R0602 Shortness of breath: Secondary | ICD-10-CM | POA: Diagnosis not present

## 2024-01-06 DIAGNOSIS — M25572 Pain in left ankle and joints of left foot: Secondary | ICD-10-CM | POA: Diagnosis not present

## 2024-01-06 DIAGNOSIS — G4700 Insomnia, unspecified: Secondary | ICD-10-CM

## 2024-01-06 NOTE — Patient Instructions (Signed)
 We discussed the use of nightlights (rather than keeping the bright bathroom light on)--light interferes with your body's melatonin and might make it harder to get back to sleep if you wake up.  We also discussed deep breathing and visualization techniques (ie--going to the beach).  We also discussed some sleep hygiene measures--not staying in bed more than 30 minutes if you can't get back to sleep, not reading or watching TV in bed, etc. See the attached handout.

## 2024-01-07 NOTE — Progress Notes (Signed)
 Faxed over notes to [facility] @ [phone]. Phone number is [phone]

## 2024-01-08 DIAGNOSIS — K219 Gastro-esophageal reflux disease without esophagitis: Secondary | ICD-10-CM | POA: Diagnosis not present

## 2024-01-08 DIAGNOSIS — M6289 Other specified disorders of muscle: Secondary | ICD-10-CM | POA: Diagnosis not present

## 2024-01-08 DIAGNOSIS — S335XXD Sprain of ligaments of lumbar spine, subsequent encounter: Secondary | ICD-10-CM | POA: Diagnosis not present

## 2024-01-08 DIAGNOSIS — Z556 Problems related to health literacy: Secondary | ICD-10-CM | POA: Diagnosis not present

## 2024-01-08 DIAGNOSIS — Z79899 Other long term (current) drug therapy: Secondary | ICD-10-CM | POA: Diagnosis not present

## 2024-01-08 DIAGNOSIS — S93602D Unspecified sprain of left foot, subsequent encounter: Secondary | ICD-10-CM | POA: Diagnosis not present

## 2024-01-08 DIAGNOSIS — Z7901 Long term (current) use of anticoagulants: Secondary | ICD-10-CM | POA: Diagnosis not present

## 2024-01-08 DIAGNOSIS — K5900 Constipation, unspecified: Secondary | ICD-10-CM | POA: Diagnosis not present

## 2024-01-08 DIAGNOSIS — E785 Hyperlipidemia, unspecified: Secondary | ICD-10-CM | POA: Diagnosis not present

## 2024-01-08 DIAGNOSIS — R32 Unspecified urinary incontinence: Secondary | ICD-10-CM | POA: Diagnosis not present

## 2024-01-08 DIAGNOSIS — Z9181 History of falling: Secondary | ICD-10-CM | POA: Diagnosis not present

## 2024-01-10 ENCOUNTER — Encounter: Payer: Self-pay | Admitting: Family Medicine

## 2024-01-13 ENCOUNTER — Ambulatory Visit: Admitting: Family Medicine

## 2024-01-23 ENCOUNTER — Telehealth: Payer: Self-pay

## 2024-01-23 NOTE — Progress Notes (Signed)
 This encounter was created in error - please disregard.

## 2024-01-23 NOTE — Addendum Note (Signed)
 Addended by: MORGAN CLAYBORNE CROME on: 01/23/2024 01:10 PM   Modules accepted: Orders, Level of Service

## 2024-01-23 NOTE — Progress Notes (Signed)
 Complex Care Management Care Guide Note  01/23/2024 Name: Samantha Foley MRN: [id number] DOB: 09-05-1929  [name] is a 89 y.o. year old female who is a primary care patient of Randol Dawes, MD and is actively engaged with the care management team. I reached out to Meade JINNY Norton by phone today to assist with re-scheduling  with the RN Case Manager.  Follow up plan: Unsuccessful telephone outreach attempt made. A HIPAA compliant phone message was left for the patient providing contact information and requesting a return call.  Leotis Rase [facility], [facility] Guide  Direct Dial: [phone]  Fax [phone]

## 2024-01-26 ENCOUNTER — Other Ambulatory Visit: Payer: Self-pay | Admitting: Family Medicine

## 2024-01-26 DIAGNOSIS — K219 Gastro-esophageal reflux disease without esophagitis: Secondary | ICD-10-CM

## 2024-01-27 ENCOUNTER — Encounter: Payer: Self-pay | Admitting: Family Medicine

## 2024-01-27 NOTE — Progress Notes (Unsigned)
 Complex Care Management Care Guide Note  01/27/2024 Name: [name] MRN: [id number] DOB: August 21, 1929  [name] is a 89 y.o. year old female who is a primary care patient of Randol Dawes, MD and is actively engaged with the care management team. I reached out to Meade JINNY Norton by phone today to assist with re-scheduling  with the RN Case Manager.  Follow up plan: Unsuccessful telephone outreach attempt made. A HIPAA compliant phone message was left for the patient providing contact information and requesting a return call.  Leotis Rase [facility], [facility] Guide  Direct Dial: [phone]  Fax [phone]

## 2024-01-29 NOTE — Progress Notes (Unsigned)
 Cardiology Office Note:  .   Date:  01/29/2024  ID:  Samantha Foley, DOB 08/28/1929, MRN [id number] PCP: Randol Dawes, MD  [facility] Providers Cardiologist:  Elspeth Sage, MD Electrophysiologist:  Elspeth Sage, MD {  History of Present Illness: Samantha Foley is a 89 y.o. female w/PMHx of  Aortic atherosclerosis on CT, CBP, HLD,  AFib  She last saw EP with dr. Sage 09/16/23 Increased palpitations off flecainide  associated with SOB, worsened exertional capacity Event monitor noted 4% PACs and less than 1% PVCs  Flecainide  resumed  No clinic visits since  Today's visit is scheduled as a 9 week follow up ROS:   She is accompanied by her daughter She is doing well No Cp, palpitations or cardiac awareness No near syncope or syncope  She gets a bit winded at times, this is somewhat episodic and she thinks attributed to her allergies, sinuses. No DOE, edema  Her daughter mentions she comments that she feels funny, the patient denies being lightheaded or near faint/feeling like fainting, just a bit off and is random, infrequent  Arrhythmia/AAD hx Flecainide  > stopped with QRS lengthening seems perhaps after accidental over-dose (tood 7 tablets) >>> Resumed March 2025  Studies Reviewed: SABRA    EKG done today and reviewed by myself:  SR 65bpm, RBBB  March 2025, monitor Patient had a min HR of 49 bpm, max HR of 187 bpm, and avg HR of 67 bpm. Predominant underlying rhythm was Sinus Rhythm. Atrial Fibrillation occurred (1% burden), ranging from 69-187 bpm (avg of 121 bpm), the longest lasting 1 hour 40 mins with an avg  rate of 118 bpm. Atrial Fibrillation was detected within +/- 45 seconds of symptomatic patient event(s). Isolated SVEs were occasional (4.4%, [id number]), SVE Couplets were rare (<1.0%, 1281), and SVE Triplets were rare (<1.0%, 215). Isolated VEs were rare  (<1.0%), and no VE Couplets or VE Triplets were present.    11/03/2018: TTE 1. The left ventricle  has normal systolic function with an ejection  fraction of 60-65%. Left ventricular diastolic Doppler parameters are  consistent with pseudonormalization.   2. The right ventricle has mildly reduced systolic function. The cavity  was mildly enlarged. There is no increase in right ventricular wall  thickness.   3. No evidence of mitral valve stenosis.   4. Aortic valve regurgitation is mild by color flow Doppler. No stenosis  of the aortic valve.   5. The aortic root and ascending aorta are normal in size and structure.   6. The interatrial septum was not assessed.    Risk Assessment/Calculations:    Physical Exam:   VS:  There were no vitals taken for this visit.   Wt Readings from Last 3 Encounters:  01/06/24 152 lb 6.4 oz (69.1 kg)  09/23/23 152 lb 12.8 oz (69.3 kg)  09/16/23 152 lb (68.9 kg)    GEN: Well nourished, well developed in no acute distress NECK: No JVD; No carotid bruits CARDIAC: RRR, no murmurs, rubs, gallops RESPIRATORY:  CTA b/l without rales, wheezing or rhonchi  ABDOMEN: Soft, non-tender, non-distended EXTREMITIES:  No edema; No deformity  ASSESSMENT AND PLAN: .    paroxysmal AFib CHA2DS2Vasc is 4, on eliquis , appropriately dosed PACs On flecainide /metoprolol  No symptoms of ectopy or arrhythmia  She has again developed RBBB with the flecainide  No clear symptoms of bradycardia  Sop flecainide  > EKG 7-10 days If recurrent symptoms > would consider amio  Secondary hypercoagulable state 2/2 AFib  4.  SOB No exam findings of volume, not overtly cardia sounding though will update her echo   Dispo: discussed Dr. Celine retirement > plan to transition to Dr. Almetta > see him in a month or so, sooner if needed   Signed, Charlies Macario Arthur, PA-C

## 2024-01-30 ENCOUNTER — Encounter: Payer: Self-pay | Admitting: Physician Assistant

## 2024-01-30 ENCOUNTER — Telehealth: Payer: Self-pay | Admitting: Home Health

## 2024-01-30 ENCOUNTER — Ambulatory Visit: Attending: Physician Assistant | Admitting: Physician Assistant

## 2024-01-30 VITALS — BP 126/78 | HR 65 | Ht 62.0 in | Wt 151.2 lb

## 2024-01-30 DIAGNOSIS — I480 Paroxysmal atrial fibrillation: Secondary | ICD-10-CM

## 2024-01-30 DIAGNOSIS — R0602 Shortness of breath: Secondary | ICD-10-CM | POA: Diagnosis not present

## 2024-01-30 DIAGNOSIS — T462X5A Adverse effect of other antidysrhythmic drugs, initial encounter: Secondary | ICD-10-CM | POA: Diagnosis not present

## 2024-01-30 DIAGNOSIS — D6869 Other thrombophilia: Secondary | ICD-10-CM | POA: Diagnosis not present

## 2024-01-30 NOTE — Patient Instructions (Addendum)
 Medication Instructions:   STOP TAKING: FLECAINIDE     *If you need a refill on your cardiac medications before your next appointment, please call your pharmacy*    Lab Work:  NONE ORDERED  TODAY    If you have labs (blood work) drawn today and your tests are completely normal, you will receive your results only by: MyChart Message (if you have MyChart) OR A paper copy in the mail If you have any lab test that is abnormal or we need to change your treatment, we will call you to review the results.  Testing/Procedures: Your physician has requested that you have an echocardiogram. Echocardiography is a painless test that uses sound waves to create images of your heart. It provides your doctor with information about the size and shape of your heart and how well your heart's chambers and valves are working. This procedure takes approximately one hour. There are no restrictions for this procedure. Please do NOT wear cologne, perfume, aftershave, or lotions (deodorant is allowed). Please arrive 15 minutes prior to your appointment time.  Please note: We ask at that you not bring children with you during ultrasound (echo/ vascular) testing. Due to room size and safety concerns, children are not allowed in the ultrasound rooms during exams. Our front office staff cannot provide observation of children in our lobby area while testing is being conducted. An adult accompanying a patient to their appointment will only be allowed in the ultrasound room at the discretion of the ultrasound technician under special circumstances. We apologize for any inconvenience.    Follow-Up: At [facility], you and your health needs are our priority.  As part of our continuing mission to provide you with exceptional heart care, our providers are all part of one team.  This team includes your primary Cardiologist (physician) and Advanced Practice Providers or APPs (Physician Assistants and Nurse Practitioners)  who all work together to provide you with the care you need, when you need it.  Your next appointment:   IN NURSE VISIT  WITH EKG ( MED CHANGE)  IN 7-10 DAYS  1 month(s)  Provider:  Dr Almetta ( CONTACT  CASSIE HALL/ ANGELINE HAMMER FOR EP SCHEDULING ISSUES )    We recommend signing up for the patient portal called MyChart.  Sign up information is provided on this After Visit Summary.  MyChart is used to connect with patients for Virtual Visits (Telemedicine).  Patients are able to view lab/test results, encounter notes, upcoming appointments, etc.  Non-urgent messages can be sent to your provider as well.   To learn more about what you can do with MyChart, go to [email or web address].   Other Instructions

## 2024-01-30 NOTE — Telephone Encounter (Signed)
 Patient called after hour line, states she has heart palpitation for 2 hours now, noted her HR is at 120s, unsure of BP,  not sure if she is in A fib or not, does not know her typical Afib symptoms, has PAFib and PACs, denied any chest pain, SOB, dizziness, felt anxious. She had taken her routine metoprolol  at 620pm, advised her to re-check symptoms as well as HR at 720pm, if HR remains elevated at 120s with palpitation, she can take an additional dose of metoprolol  25mg  tonight, if continue to not responding to medication, may need ER visit tonight.  This was also explained to her neighbor who is a Engineer, civil (consulting). She has agreed with above plan.

## 2024-02-07 ENCOUNTER — Ambulatory Visit: Attending: Cardiovascular Disease | Admitting: Emergency Medicine

## 2024-02-07 VITALS — BP 116/79 | HR 93

## 2024-02-07 DIAGNOSIS — I480 Paroxysmal atrial fibrillation: Secondary | ICD-10-CM | POA: Diagnosis not present

## 2024-02-07 DIAGNOSIS — Z79899 Other long term (current) drug therapy: Secondary | ICD-10-CM

## 2024-02-07 NOTE — Progress Notes (Signed)
   Nurse Visit   Date of Encounter: 02/07/2024 ID: Samantha Foley, DOB 26-Mar-1930, MRN [id number]  PCP:  Randol Dawes, MD   [facility] Providers Cardiologist:  Elspeth Sage, MD Electrophysiologist:  Elspeth Sage, MD {  Visit Details   VS:  BP 116/79 (BP Location: Left Arm, Patient Position: Sitting, Cuff Size: Normal)   Pulse 93   SpO2 95%  , BMI There is no height or weight on file to calculate BMI.  Wt Readings from Last 3 Encounters:  01/30/24 151 lb 3.2 oz (68.6 kg)  01/06/24 152 lb 6.4 oz (69.1 kg)  09/23/23 152 lb 12.8 oz (69.3 kg)     Reason for visit: EKG Performed today: BP and EKG Changes (medications, testing, etc.) : None at this time- appt made with DOD- Dr Wonda 02/10/24 at 3pm Length of Visit: 20 minutes    Medications Adjustments/Labs and Tests Ordered: Orders Placed This Encounter  Procedures   EKG 12-Lead   No orders of the defined types were placed in this encounter.  Dr Nancey reviewed EKG. Informed of pt complaints of shortness of breath and fatigue. Patient's heart rate staying under 100 today, but fluctuating and patient is fatigued. No medication changes made at this time. Dr Nancey instructed that the patient needs an office visit and medication changes can be made at that time.  First available visit is a DOD appointment 02/10/24 with Dr Wonda.  Pt and her daughter given ER precautions and told to call the on call provider with any questions/concerns over the weekend.  They verbalized understanding.   Signed, Comer LITTIE Ebbs, RN  02/07/2024 2:56 PM

## 2024-02-10 ENCOUNTER — Ambulatory Visit: Attending: Cardiovascular Disease | Admitting: Cardiovascular Disease

## 2024-02-10 ENCOUNTER — Encounter: Payer: Self-pay | Admitting: Cardiovascular Disease

## 2024-02-10 VITALS — BP 118/74 | HR 96 | Ht 63.0 in | Wt 154.8 lb

## 2024-02-10 DIAGNOSIS — R002 Palpitations: Secondary | ICD-10-CM

## 2024-02-10 DIAGNOSIS — I480 Paroxysmal atrial fibrillation: Secondary | ICD-10-CM

## 2024-02-10 MED ORDER — METOPROLOL TARTRATE 25 MG PO TABS: 25.0000 mg | ORAL_TABLET | Freq: Two times a day (BID) | ORAL | 3 refills | Status: DC

## 2024-02-10 NOTE — Assessment & Plan Note (Signed)
 As above, continue apixaban  for anticoagulation and metoprolol  for symptomatic control.  Consider amiodarone  if further breakthrough symptoms.

## 2024-02-10 NOTE — Patient Instructions (Signed)
 Medication Instructions:  CHANGE - Metoprolol  Tartrate (Lopressor ) 25 mg twice daily and you can take 1 additional 25 mg tablet as needed with heart palpitations.  *If you need a refill on your cardiac medications before your next appointment, please call your pharmacy*  Lab Work: None ordered today. If you have labs (blood work) drawn today and your tests are completely normal, you will receive your results only by: MyChart Message (if you have MyChart) OR A paper copy in the mail If you have any lab test that is abnormal or we need to change your treatment, we will call you to review the results.  Testing/Procedures: None ordered today.  Follow-Up: At [facility], you and your health needs are our priority.  As part of our continuing mission to provide you with exceptional heart care, our providers are all part of one team.  This team includes your primary Cardiologist (physician) and Advanced Practice Providers or APPs (Physician Assistants and Nurse Practitioners) who all work together to provide you with the care you need, when you need it.  Your next appointment:   03/17/24 at 4 PM  Provider:   Donnice Primus, MD

## 2024-02-10 NOTE — Progress Notes (Signed)
 Cardiology Office Note:    Date:  02/10/2024   ID:  Samantha Foley, DOB 20-Feb-1930, MRN [id number]  PCP:  Samantha Dawes, MD   [facility] Providers Cardiologist:  Samantha Sage, MD Electrophysiologist:  Samantha DELENA Primus, MD     Referring MD: Samantha Dawes, MD   Chief Complaint  Patient presents with   Palpitations    History of Present Illness:    Samantha Foley is a 89 y.o. female with a hx of heart palpitations, recently seen by Samantha Arthur, PA, who called in with increased symptoms of tachycardia and was added on to my schedule today.  She has a structurally normal heart based on past echo assessment with LVEF 60 to 65%.  A heart monitor earlier this year showed an average heart rate of 67 bpm with a 1% atrial fibrillation burden.  The patient's flecainide  was discontinued last week due to a widened QRS complex.  She was added on today because of worsening heart palpitations and erratic heart rate readings.  She denies chest pain, but did have some lightheadedness and heart palpitations prompting this visit.  She has had increased frequency of palpitations.  No other complaints today.  Her grandson is present with her at the visit and her daughter is conferenced in over the telephone.  Current Medications: Current Meds  Medication Sig   acetaminophen  (TYLENOL ) 500 MG tablet Take 1,000 mg by mouth every 6 (six) hours as needed for mild pain (pain score 1-3) or headache.   apixaban  (ELIQUIS ) 5 MG TABS tablet Take 1 tablet (5 mg total) by mouth 2 (two) times daily.   estradiol (ESTRACE) 0.1 MG/GM vaginal cream Place 1 Applicatorful vaginally 3 (three) times a week.   fluticasone  (FLONASE ) 50 MCG/ACT nasal spray Place 2 sprays into both nostrils daily as needed for allergies or rhinitis.   gabapentin  (NEURONTIN ) 100 MG capsule Take 100 mg by mouth at bedtime as needed (Sleep issues).   lidocaine  (XYLOCAINE ) 5 % ointment Apply 1 Application topically 3 (three) times daily as  needed for moderate pain (pain score 4-6).   loratadine (CLARITIN) 10 MG tablet Take 10 mg by mouth daily as needed for allergies, rhinitis or itching.   Multiple Vitamin (MULTIVITAMIN WITH MINERALS) TABS tablet Take 1 tablet by mouth daily.   Multiple Vitamins-Minerals (ICAPS AREDS 2 PO) Take 1 capsule by mouth 2 (two) times a day.   omeprazole  (PRILOSEC) 20 MG capsule TAKE 1 CAPSULE BY MOUTH EVERY DAY   rosuvastatin  (CRESTOR ) 10 MG tablet TAKE 1 TABLET BY MOUTH EVERY DAY   senna-docusate (SENOKOT-S) 8.6-50 MG tablet Take 1 tablet by mouth at bedtime as needed for mild constipation.   vitamin B-12 (CYANOCOBALAMIN) 1000 MCG tablet Take 1 tablet by mouth daily.   [DISCONTINUED] metoprolol  tartrate (LOPRESSOR ) 25 MG tablet Take 1 tablet (25 mg total) by mouth 2 (two) times daily.     Allergies:   Apixaban , Ciprofloxacin, Scallops [shellfish allergy], Doxycycline , Penicillins, Rivaroxaban , Demerol, Milk-related compounds, Sudafed [pseudoephedrine hcl], and Cherry   ROS:   Please see the history of present illness.    All other systems reviewed and are negative.  EKGs/Labs/Other Studies Reviewed:    The following studies were reviewed today: Cardiac Studies & Procedures   ______________________________________________________________________________________________   STRESS TESTS  MYOCARDIAL PERFUSION IMAGING 11/02/2015  Interpretation Summary  The left ventricular ejection fraction is hyperdynamic (>65%).  Nuclear stress EF: 74%.  There was no ST segment deviation noted during stress.  Defect 1: There is  a small defect of moderate severity present in the mid inferolateral and apical lateral location. This is present at rest and not at stress. This is artifact.  The study is normal.  This is a low risk study.   ECHOCARDIOGRAM  ECHOCARDIOGRAM COMPLETE 11/03/2018  Narrative ECHOCARDIOGRAM REPORT    Patient Name:   Samantha Foley [facility] Date of Exam: 11/03/2018 Medical Rec #:   [id number]        Height:       62.0 in Accession #:    [id number]       Weight:       155.0 lb Date of Birth:  [birth year]       BSA:          1.72 m Patient Age:    88 years         BP:           109/72 mmHg Patient Gender: F                HR:           79 bpm. Exam Location:  Inpatient   Procedure: 2D Echo  Indications:    Atrial Fibrillation 427.31 / I48.91  History:        Patient has prior history of Echocardiogram examinations, most recent 10/30/2012. Risk Factors: Hypertension and Dyslipidemia. GERD.  Sonographer:    Samantha Foley RDCS Referring Phys: [id number] MAUDE JAYSON EMMER  IMPRESSIONS   1. The left ventricle has normal systolic function with an ejection fraction of 60-65%. Left ventricular diastolic Doppler parameters are consistent with pseudonormalization. 2. The right ventricle has mildly reduced systolic function. The cavity was mildly enlarged. There is no increase in right ventricular wall thickness. 3. No evidence of mitral valve stenosis. 4. Aortic valve regurgitation is mild by color flow Doppler. No stenosis of the aortic valve. 5. The aortic root and ascending aorta are normal in size and structure. 6. The interatrial septum was not assessed.  FINDINGS Left Ventricle: The left ventricle has normal systolic function, with an ejection fraction of 60-65%. The cavity size was left ventricular size was not assessed. There is no increase in left ventricular wall thickness. Left ventricular diastolic Doppler parameters are consistent with pseudonormalization.  Right Ventricle: The right ventricle has mildly reduced systolic function. The cavity was mildly enlarged. There is no increase in right ventricular wall thickness.  Left Atrium: Left atrial size was normal in size.  Right Atrium: Right atrial size was normal in size. Right atrial pressure is estimated at 3 mmHg.  Interatrial Septum: The interatrial septum was not assessed.  Pericardium: There is no evidence  of pericardial effusion.  Mitral Valve: The mitral valve is normal in structure. Mitral valve regurgitation is mild by color flow Doppler. No evidence of mitral valve stenosis.  Tricuspid Valve: The tricuspid valve is normal in structure. Tricuspid valve regurgitation is mild by color flow Doppler.  Aortic Valve: The aortic valve is normal in structure. Aortic valve regurgitation is mild by color flow Doppler. There is No stenosis of the aortic valve.  Pulmonic Valve: The pulmonic valve was not assessed. Pulmonic valve regurgitation was not assessed by color flow Doppler.  Aorta: The aortic root and ascending aorta are normal in size and structure.   +--------------+--------++ LEFT VENTRICLE              +----------------+---------++ +--------------+--------++      Diastology  PLAX 2D                     +----------------+---------++ +--------------+--------++      LV e' lateral:  6.53 cm/s LVIDd:        3.86 cm       +----------------+---------++ +--------------+--------++      LV E/e' lateral:19.0      LVIDs:        2.82 cm       +----------------+---------++ +--------------+--------++      LV e' medial:   9.68 cm/s LV PW:        1.04 cm       +----------------+---------++ +--------------+--------++      LV E/e' medial: 12.8      LV IVS:       1.14 cm       +----------------+---------++ +--------------+--------++ LVOT diam:    1.80 cm  +--------------+--------++ LV SV:        34 ml    +--------------+--------++ LV SV Index:  19.29    +--------------+--------++ LVOT Area:    2.54 cm +--------------+--------++                        +--------------+--------++  +------------------+---------++ LV Volumes (MOD)            +------------------+---------++ LV area d, A2C:   22.40 cm +------------------+---------++ LV area d, A4C:   22.60 cm +------------------+---------++ LV area s,  A2C:   13.70 cm +------------------+---------++ LV area s, A4C:   13.60 cm +------------------+---------++ LV major d, A2C:  6.84 cm   +------------------+---------++ LV major d, A4C:  6.90 cm   +------------------+---------++ LV major s, A2C:  6.07 cm   +------------------+---------++ LV major s, A4C:  6.21 cm   +------------------+---------++ LV vol d, MOD A2C:61.7 ml   +------------------+---------++ LV vol d, MOD A4C:60.6 ml   +------------------+---------++ LV vol s, MOD A2C:26.0 ml   +------------------+---------++ LV vol s, MOD A4C:24.9 ml   +------------------+---------++ LV SV MOD A2C:    35.7 ml   +------------------+---------++ LV SV MOD A4C:    60.6 ml   +------------------+---------++ LV SV MOD BP:     35.6 ml   +------------------+---------++  +---------------+----------++ RIGHT VENTRICLE           +---------------+----------++ RV S prime:    12.80 cm/s +---------------+----------++  +---------------+-------++-----------++ LEFT ATRIUM           Index       +---------------+-------++-----------++ LA diam:       3.50 cm2.04 cm/m  +---------------+-------++-----------++ LA Vol (A2C):  33.4 ml19.47 ml/m +---------------+-------++-----------++ LA Vol (A4C):  60.5 ml35.27 ml/m +---------------+-------++-----------++ LA Biplane Vol:47.5 ml27.69 ml/m +---------------+-------++-----------++ +------------+-----------++ AORTIC VALVE            +------------+-----------++ LVOT Vmax:  91.60 cm/s  +------------+-----------++ LVOT Vmean: 61.500 cm/s +------------+-----------++ LVOT VTI:   0.194 m     +------------+-----------++  +-------------+-------++ AORTA                +-------------+-------++ Ao Root diam:2.50 cm +-------------+-------++ Ao Asc diam: 3.30 cm +-------------+-------++  +--------------+----------++   +---------------+-----------++ MITRAL VALVE              TRICUSPID VALVE            +--------------+----------++  +---------------+-----------++ MV Area (PHT):5.88 cm    TR Peak grad:  25.1 mmHg   +--------------+----------++  +---------------+-----------++ MV PHT:       37.41 msec  TR Vmax:  296.00 cm/s +--------------+----------++  +---------------+-----------++ MV Decel Time:129 msec   +--------------+----------++ +--------------+-----------++ MV E velocity:124.00 cm/s +--------------+-----------++ MV A velocity:74.70 cm/s  +--------------+-----------++ MV E/A ratio: 1.66        +--------------+-----------++   Aleene Passe MD Electronically signed by Aleene Passe MD Signature Date/Time: 11/03/2018/3:50:02 PM    Final    MONITORS  LONG TERM MONITOR (3-14 DAYS) 09/12/2023  Narrative Patch Wear Time:  6 days and 23 hours (2025-03-04T18:48:01-499 to 2025-03-11T19:21:15-0400)  Patient had a min HR of 49 bpm, max HR of 187 bpm, and avg HR of 67 bpm. Predominant underlying rhythm was Sinus Rhythm. Atrial Fibrillation occurred (1% burden), ranging from 69-187 bpm (avg of 121 bpm), the longest lasting 1 hour 40 mins with an avg rate of 118 bpm. Atrial Fibrillation was detected within +/- 45 seconds of symptomatic patient event(s). Isolated SVEs were occasional (4.4%, [id number]), SVE Couplets were rare (<1.0%, 1281), and SVE Triplets were rare (<1.0%, 215). Isolated VEs were rare (<1.0%), and no VE Couplets or VE Triplets were present.  PAC PVC burden reviewed with patient in offic e       ______________________________________________________________________________________________      EKG:        Recent Labs: 08/26/2023: ALT 15; BUN 13; Creatinine, Ser 0.78; Hemoglobin 12.8; Platelets 213; Potassium 4.0; Sodium 138  Recent Lipid Panel    Component Value Date/Time   CHOL 132 08/08/2023 1017   TRIG 71 08/08/2023 1017   HDL 53  08/08/2023 1017   CHOLHDL 2.5 08/08/2023 1017   CHOLHDL 4.5 05/26/2012 1044   VLDL 18 05/26/2012 1044   LDLCALC 65 08/08/2023 1017               Physical Exam:    VS:  BP 118/74   Pulse 96   Ht 5' 3 (1.6 m)   Wt 154 lb 12.8 oz (70.2 kg)   SpO2 92%   BMI 27.42 kg/m     Wt Readings from Last 3 Encounters:  02/10/24 154 lb 12.8 oz (70.2 kg)  01/30/24 151 lb 3.2 oz (68.6 kg)  01/06/24 152 lb 6.4 oz (69.1 kg)     GEN:  Well nourished, well developed pleasant elderly woman in no acute distress HEENT: Normal NECK: No JVD; No carotid bruits LYMPHATICS: No lymphadenopathy CARDIAC: RRR, no murmurs, rubs, gallops RESPIRATORY:  Clear to auscultation without rales, wheezing or rhonchi  ABDOMEN: Soft, non-tender, non-distended MUSCULOSKELETAL:  No edema; No deformity  SKIN: Warm and dry NEUROLOGIC:  Alert and oriented x 3 PSYCHIATRIC:  Normal affect   Assessment & Plan Palpitations Exam and EKG confirm sinus rhythm.  I reviewed her outpatient telemetry demonstrating low atrial fibrillation burden.  Discussed potential treatment options of her palpitations and arrhythmia.  I think amiodarone  would be a reasonable choice and I would load her with 200 mg twice daily for a few weeks and then 200 mg daily thereafter.  I discussed the potential side effects of amiodarone  as well as potential benefits.  I discussed my general comfort level using amiodarone  in elderly patients as long as they undergo appropriate surveillance.  The patient is reluctant to start this medication due to concern about the side effect profile.  Therefore, she will continue on metoprolol  25 mg twice daily and she understands that she could take an additional 25 mg for any breakthrough symptoms.  She will contact us  if she decides to start amiodarone .  She should continue on apixaban  at the current dose.  She has EP follow-up  already scheduled. Paroxysmal atrial fibrillation (HCC) As above, continue apixaban  for  anticoagulation and metoprolol  for symptomatic control.  Consider amiodarone  if further breakthrough symptoms.            Medication Adjustments/Labs and Tests Ordered: Current medicines are reviewed at length with the patient today.  Concerns regarding medicines are outlined above.  Orders Placed This Encounter  Procedures   EKG 12-Lead   Meds ordered this encounter  Medications   metoprolol  tartrate (LOPRESSOR ) 25 MG tablet    Sig: Take 1 tablet (25 mg total) by mouth 2 (two) times daily. You can take 1 additional 25 mg tablet daily if having heart palpitations.    Dispense:  90 tablet    Refill:  3    Patient Instructions  Medication Instructions:  CHANGE - Metoprolol  Tartrate (Lopressor ) 25 mg twice daily and you can take 1 additional 25 mg tablet as needed with heart palpitations.  *If you need a refill on your cardiac medications before your next appointment, please call your pharmacy*  Lab Work: None ordered today. If you have labs (blood work) drawn today and your tests are completely normal, you will receive your results only by: MyChart Message (if you have MyChart) OR A paper copy in the mail If you have any lab test that is abnormal or we need to change your treatment, we will call you to review the results.  Testing/Procedures: None ordered today.  Follow-Up: At [facility], you and your health needs are our priority.  As part of our continuing mission to provide you with exceptional heart care, our providers are all part of one team.  This team includes your primary Cardiologist (physician) and Advanced Practice Providers or APPs (Physician Assistants and Nurse Practitioners) who all work together to provide you with the care you need, when you need it.  Your next appointment:   03/17/24 at 4 PM  Provider:   Donnice Primus, MD    Signed, Ozell Fell, MD  02/10/2024 4:39 PM    [facility]

## 2024-02-13 NOTE — Telephone Encounter (Signed)
 I would be more than happy to see her, but I would still recommend establishing with Dr. Almetta who specializes in atrial fibrillation.  If she has any general cardiology issues I am happy to take care of her.

## 2024-02-21 DIAGNOSIS — Z961 Presence of intraocular lens: Secondary | ICD-10-CM | POA: Diagnosis not present

## 2024-02-21 DIAGNOSIS — H353122 Nonexudative age-related macular degeneration, left eye, intermediate dry stage: Secondary | ICD-10-CM | POA: Diagnosis not present

## 2024-02-21 DIAGNOSIS — D3132 Benign neoplasm of left choroid: Secondary | ICD-10-CM | POA: Diagnosis not present

## 2024-02-21 DIAGNOSIS — H353231 Exudative age-related macular degeneration, bilateral, with active choroidal neovascularization: Secondary | ICD-10-CM | POA: Diagnosis not present

## 2024-03-03 ENCOUNTER — Encounter: Payer: Self-pay | Admitting: Family Medicine

## 2024-03-03 NOTE — Progress Notes (Unsigned)
 No chief complaint on file.  Her daughter Luke sent the following message yesterday: Mom has been feeling quite poorly for a few days. She can't really articulate why and she seems weak and generally unwell. She's had a few episodes of a rapid heartbeat but these have been effectively treated with the extra dose of metoprolol  prescribed by her cardiologist. (The echo we discussed the last time she was in, is scheduled for Friday.) We don't know what's wrong but something definitely is.  She is accompanied by her daughter Mitzie today    She saw Dr. Wonda 8/18--she had been having more frequent tachycardia. Flecainide  was stopped the week prior d/t widened QRS complex.  Heart monitor earlier in the year showed 1% afib burden.  Last EF 60-65%. Dr. Wonda had recommended amiodarone , but they were reluctant d/t SE profile.  She is taking metoprolol  25 mg BID, and takes an extra dose prn for breakthrough symptoms. She continues on apixaban .  She has appt 9/23 with Dr. Almetta, and echo scheduled for 9/12.    PMH, PSH, SH reviewed   ROS:    PHYSICAL EXAM:  There were no vitals taken for this visit.  Wt Readings from Last 3 Encounters:  02/10/24 154 lb 12.8 oz (70.2 kg)  01/30/24 151 lb 3.2 oz (68.6 kg)  01/06/24 152 lb 6.4 oz (69.1 kg)       ASSESSMENT/PLAN:   Cbc, c-met, TSH, Mg   EKG if irregular rhythm, or poss based on sx

## 2024-03-04 ENCOUNTER — Ambulatory Visit: Payer: Self-pay | Admitting: Family Medicine

## 2024-03-04 ENCOUNTER — Ambulatory Visit (INDEPENDENT_AMBULATORY_CARE_PROVIDER_SITE_OTHER): Admitting: Family Medicine

## 2024-03-04 ENCOUNTER — Encounter: Payer: Self-pay | Admitting: Family Medicine

## 2024-03-04 VITALS — BP 118/70 | HR 68 | Ht 63.0 in | Wt 155.0 lb

## 2024-03-04 DIAGNOSIS — R8290 Unspecified abnormal findings in urine: Secondary | ICD-10-CM | POA: Diagnosis not present

## 2024-03-04 DIAGNOSIS — R5383 Other fatigue: Secondary | ICD-10-CM

## 2024-03-04 DIAGNOSIS — Z5181 Encounter for therapeutic drug level monitoring: Secondary | ICD-10-CM

## 2024-03-04 DIAGNOSIS — R0602 Shortness of breath: Secondary | ICD-10-CM

## 2024-03-04 DIAGNOSIS — Z23 Encounter for immunization: Secondary | ICD-10-CM | POA: Diagnosis not present

## 2024-03-04 DIAGNOSIS — Z7901 Long term (current) use of anticoagulants: Secondary | ICD-10-CM

## 2024-03-04 DIAGNOSIS — R531 Weakness: Secondary | ICD-10-CM | POA: Diagnosis not present

## 2024-03-04 DIAGNOSIS — J309 Allergic rhinitis, unspecified: Secondary | ICD-10-CM | POA: Diagnosis not present

## 2024-03-04 DIAGNOSIS — N3000 Acute cystitis without hematuria: Secondary | ICD-10-CM

## 2024-03-04 LAB — POCT URINALYSIS DIP (PROADVANTAGE DEVICE)
Bilirubin, UA: NEGATIVE
Blood, UA: NEGATIVE
Glucose, UA: 100 mg/dL — AB
Nitrite, UA: NEGATIVE
Protein Ur, POC: 30 mg/dL — AB
Specific Gravity, Urine: 1.02
Urobilinogen, Ur: 2
pH, UA: 6 (ref 5.0–8.0)

## 2024-03-04 NOTE — Patient Instructions (Signed)
 FATIGUE AND EXCESSIVE DAYTIME SLEEPINESS WITH GENERALIZED WEAKNES  -We will do blood tests to check for anemia, electrolyte levels, kidney and liver function, and thyroid  function. -We will also check your iron levels. -Please try to eat balanced meals regularly and stay hydrated with at least six glasses of non-caffeinated, non-alcoholic beverages daily. -Monitor your blood pressure regularly, especially when you are feeling weak  MEMORY LOSS, WORSENING -We will evaluate with labs today -We will schedule a follow-up for a memory evaluation, including a mini mental status exam.  DRY COUGH, INTERMITTENT, RECENT ONSET: Your dry cough may be due to postnasal drip and allergies.4 -Continue Allegra and flonase  -Consider taking Mucinex  DECREASED APPETITE AND ANOSMIA: Your decreased appetite is likely due to loss of smell after COVID-19. -Try to eat regular meals with a focus on nutrition. -Remember that eating is important for your energy and health.

## 2024-03-05 LAB — URINALYSIS, MICROSCOPIC ONLY
Casts: NONE SEEN /LPF
WBC, UA: >30 /HPF — AB (ref 0–5)

## 2024-03-05 MED ORDER — NITROFURANTOIN MONOHYD MACRO 100 MG PO CAPS: 100.0000 mg | ORAL_CAPSULE | Freq: Two times a day (BID) | ORAL | 0 refills | Status: DC

## 2024-03-06 ENCOUNTER — Ambulatory Visit (HOSPITAL_COMMUNITY)
Admission: RE | Admit: 2024-03-06 | Discharge: 2024-03-06 | Disposition: A | Discharge status: Home / Self Care | Source: Ambulatory Visit | Attending: Physician Assistant | Admitting: Physician Assistant

## 2024-03-06 DIAGNOSIS — R0602 Shortness of breath: Secondary | ICD-10-CM | POA: Insufficient documentation

## 2024-03-06 LAB — ECHOCARDIOGRAM COMPLETE
AR max vel: 2.32 cm2
AV Area VTI: 2.7 cm2
AV Area mean vel: 2.3 cm2
AV Mean grad: 4 mmHg
AV Peak grad: 6 mmHg
Ao pk vel: 1.22 m/s
Area-P 1/2: 3.65 cm2
MV M vel: 5.02 m/s
MV Peak grad: 100.8 mmHg
P 1/2 time: 807 ms
S' Lateral: 2.5 cm

## 2024-03-06 NOTE — Addendum Note (Signed)
 Addended by: Collen Hostler L on: 03/06/2024 05:50 PM   Modules accepted: Orders

## 2024-03-09 ENCOUNTER — Emergency Department (HOSPITAL_BASED_OUTPATIENT_CLINIC_OR_DEPARTMENT_OTHER)
Admission: EM | Admit: 2024-03-09 | Discharge: 2024-03-09 | Disposition: A | Discharge status: Home / Self Care | Source: Ambulatory Visit | Attending: Emergency Medicine | Admitting: Emergency Medicine

## 2024-03-09 ENCOUNTER — Ambulatory Visit: Payer: Self-pay

## 2024-03-09 ENCOUNTER — Other Ambulatory Visit: Payer: Self-pay

## 2024-03-09 ENCOUNTER — Encounter: Payer: Self-pay | Admitting: Family Medicine

## 2024-03-09 ENCOUNTER — Emergency Department (HOSPITAL_BASED_OUTPATIENT_CLINIC_OR_DEPARTMENT_OTHER): Admitting: Radiology

## 2024-03-09 DIAGNOSIS — Z7901 Long term (current) use of anticoagulants: Secondary | ICD-10-CM | POA: Insufficient documentation

## 2024-03-09 DIAGNOSIS — R531 Weakness: Secondary | ICD-10-CM | POA: Diagnosis not present

## 2024-03-09 DIAGNOSIS — I700 Atherosclerosis of aorta: Secondary | ICD-10-CM | POA: Diagnosis not present

## 2024-03-09 DIAGNOSIS — R0600 Dyspnea, unspecified: Secondary | ICD-10-CM | POA: Diagnosis not present

## 2024-03-09 DIAGNOSIS — K449 Diaphragmatic hernia without obstruction or gangrene: Secondary | ICD-10-CM | POA: Diagnosis not present

## 2024-03-09 DIAGNOSIS — I959 Hypotension, unspecified: Secondary | ICD-10-CM | POA: Diagnosis not present

## 2024-03-09 LAB — URINALYSIS, ROUTINE W REFLEX MICROSCOPIC
Bacteria, UA: NONE SEEN
Bilirubin Urine: NEGATIVE
Glucose, UA: NEGATIVE mg/dL
Hgb urine dipstick: NEGATIVE
Ketones, ur: NEGATIVE mg/dL
Nitrite: NEGATIVE
Protein, ur: NEGATIVE mg/dL
Specific Gravity, Urine: 1.01 (ref 1.005–1.030)
WBC, UA: >50 WBC/hpf (ref 0–5)
pH: 7 (ref 5.0–8.0)

## 2024-03-09 LAB — CBC
HCT: 38 % (ref 36.0–46.0)
Hemoglobin: 12.5 g/dL (ref 12.0–15.0)
MCH: 28.3 pg (ref 26.0–34.0)
MCHC: 32.9 g/dL (ref 30.0–36.0)
MCV: 86 fL (ref 80.0–100.0)
Platelets: 185 K/uL (ref 150–400)
RBC: 4.42 MIL/uL (ref 3.87–5.11)
RDW: 15.5 % (ref 11.5–15.5)
WBC: 7.8 K/uL (ref 4.0–10.5)
nRBC: 0 % (ref 0.0–0.2)

## 2024-03-09 LAB — URINE CULTURE

## 2024-03-09 LAB — COMPREHENSIVE METABOLIC PANEL WITH GFR
ALT: 16 U/L (ref 0–44)
AST: 22 U/L (ref 15–41)
Albumin: 4 g/dL (ref 3.5–5.0)
Alkaline Phosphatase: 121 U/L (ref 38–126)
Anion gap: 14 (ref 5–15)
BUN: 13 mg/dL (ref 8–23)
CO2: 24 mmol/L (ref 22–32)
Calcium: 10 mg/dL (ref 8.9–10.3)
Chloride: 101 mmol/L (ref 98–111)
Creatinine, Ser: 0.82 mg/dL (ref 0.44–1.00)
GFR, Estimated: >60 mL/min (ref >60)
Glucose, Bld: 189 mg/dL — ABNORMAL HIGH (ref 70–99)
Potassium: 3.9 mmol/L (ref 3.5–5.1)
Sodium: 138 mmol/L (ref 135–145)
Total Bilirubin: 0.5 mg/dL (ref 0.0–1.2)
Total Protein: 7 g/dL (ref 6.5–8.1)

## 2024-03-09 LAB — TROPONIN T, HIGH SENSITIVITY: Troponin T High Sensitivity: <15 ng/L (ref 0–19)

## 2024-03-09 MED ORDER — LACTATED RINGERS IV BOLUS
500.0000 mL | Freq: Once | INTRAVENOUS | Status: AC
  Administered 2024-03-09: 500 mL via INTRAVENOUS

## 2024-03-09 NOTE — Telephone Encounter (Signed)
 FYI Only or Action Required?: FYI only for provider.  Patient was last seen in primary care on 03/04/2024 by Randol Dawes, MD.  Called Nurse Triage reporting low blood pressure.  Symptoms began today.  Interventions attempted: Prescription medications: ABX.  Symptoms are: gradually worsening.  Triage Disposition: Go to ED Now (or PCP Triage)  Patient/caregiver understands and will follow disposition?: YES      Copied from CRM #[id number]. Topic: Clinical - Red Word Triage >> Mar 09, 2024  2:21 PM Drema MATSU wrote: Red Word that prompted transfer to Nurse Triage: Patient is experiencing low blood pressure. 97/61 Patient feels very weak. Reason for Disposition  [1] Fall in systolic BP > 20 mm Hg from normal AND [2] feeling weak or lightheaded  Answer Assessment - Initial Assessment Questions 1. BLOOD PRESSURE: What is your blood pressure? Did you take at least two measurements 5 minutes apart?     97/61  rechecked 94/60  2. ONSET: When did you take your blood pressure?     Just before  3. HOW: How did you take your blood pressure? (e.g., visiting nurse, automatic home BP monitor)     Auto home blood pressure   6. PULSE RATE: Do you know what your pulse rate is?      84 7. OTHER SYMPTOMS: Have you been sick recently? Have you had a recent injury?     Very weak weaker since OV 03/04/24  Protocols used: Blood Pressure - Low-A-AH

## 2024-03-09 NOTE — ED Provider Notes (Signed)
 [place] EMERGENCY DEPARTMENT AT [facility] Provider Note   CSN: [id number] Arrival date & time: 03/09/24  1520     Patient presents with: Hypotension   Samantha Foley is a 89 y.o. female.   HPI 89 year old female presents with generalized weakness and hypotension.  History is from patient and daughters.  The patient has been feeling overall weak/no energy for about 1 week.  Saw her doctor in the middle of last week and was found to have a UTI on urine dip and urine was sent for culture and she was put on Macrobid  for 1 week.  Seems like the weakness is not much better despite this.  She has been having some on and off shortness of breath though no chest pain.  No headache during this time although does occasionally get some sharp discomfort in her scalp since being in the treatment room but denies any significant headache currently.  No focal weakness.  There has been no confusion.  This morning, the patient felt acutely lightheaded and seemed weaker and they checked her blood pressure and it was in the 90s on 2 different readings.  She was brought to the ER for this.  She does not take antihypertensive meds at baseline.  No other new meds.  Prior to Admission medications   Medication Sig Start Date End Date Taking? Authorizing Provider  acetaminophen  (TYLENOL ) 500 MG tablet Take 1,000 mg by mouth every 6 (six) hours as needed for mild pain (pain score 1-3) or headache.    [provider]  apixaban  (ELIQUIS ) 5 MG TABS tablet Take 1 tablet (5 mg total) by mouth 2 (two) times daily. 09/30/23   Fernande Elspeth BROCKS, MD  estradiol (ESTRACE) 0.1 MG/GM vaginal cream Place 1 Applicatorful vaginally 3 (three) times a week. Patient not taking: Reported on 03/04/2024 03/21/23   [provider]  fexofenadine (ALLEGRA) 180 MG tablet Take 180 mg by mouth daily.    [provider]  fluticasone  (FLONASE ) 50 MCG/ACT nasal spray Place 2 sprays into both nostrils daily as  needed for allergies or rhinitis.    [provider]  gabapentin  (NEURONTIN ) 100 MG capsule Take 100 mg by mouth at bedtime as needed (Sleep issues).    [provider]  lidocaine  (XYLOCAINE ) 5 % ointment Apply 1 Application topically 3 (three) times daily as needed for moderate pain (pain score 4-6). 12/07/23   Long, Joshua G, MD  Magnesium  Glycinate 120 MG CAPS Take 120 mg by mouth as needed.    [provider]  metoprolol  tartrate (LOPRESSOR ) 25 MG tablet Take 1 tablet (25 mg total) by mouth 2 (two) times daily. You can take 1 additional 25 mg tablet daily if having heart palpitations. 02/10/24   Cooper, Michael, MD  Multiple Vitamin (MULTIVITAMIN WITH MINERALS) TABS tablet Take 1 tablet by mouth daily.    [provider]  Multiple Vitamins-Minerals (ICAPS AREDS 2 PO) Take 1 capsule by mouth daily.    [provider]  nitrofurantoin , macrocrystal-monohydrate, (MACROBID ) 100 MG capsule Take 1 capsule (100 mg total) by mouth 2 (two) times daily. 03/05/24   Randol Dawes, MD  omeprazole  (PRILOSEC) 20 MG capsule TAKE 1 CAPSULE BY MOUTH EVERY DAY 01/27/24   Randol Dawes, MD  rosuvastatin  (CRESTOR ) 10 MG tablet TAKE 1 TABLET BY MOUTH EVERY DAY 09/23/23   Randol Dawes, MD  vitamin B-12 (CYANOCOBALAMIN) 1000 MCG tablet Take 1 tablet by mouth daily.    [provider]    Allergies: Apixaban ,  Ciprofloxacin, Scallops [shellfish allergy], Doxycycline , Penicillins, Rivaroxaban , Demerol, Milk-related compounds, Sudafed [pseudoephedrine hcl], and Cherry    Review of Systems  Constitutional:  Positive for fatigue. Negative for fever.  Respiratory:  Positive for shortness of breath. Negative for cough.   Cardiovascular:  Negative for chest pain.  Gastrointestinal:  Negative for vomiting.  Neurological:  Positive for weakness and light-headedness. Negative for syncope and numbness.    Updated Vital Signs BP (!) 145/58   Pulse 61   Temp 97.8 F (36.6 C)   Resp 16    SpO2 97%   Physical Exam Vitals and nursing note reviewed.  Constitutional:      General: She is not in acute distress.    Appearance: She is well-developed. She is not ill-appearing or diaphoretic.  HENT:     Head: Normocephalic and atraumatic.  Eyes:     Extraocular Movements: Extraocular movements intact.     Pupils: Pupils are equal, round, and reactive to light.  Cardiovascular:     Rate and Rhythm: Normal rate and regular rhythm.     Heart sounds: Normal heart sounds.  Pulmonary:     Effort: Pulmonary effort is normal.     Breath sounds: Normal breath sounds.  Abdominal:     Palpations: Abdomen is soft.     Tenderness: There is no abdominal tenderness.  Skin:    General: Skin is warm and dry.  Neurological:     Mental Status: She is alert.     Comments: CN 3-12 grossly intact. 5/5 strength in all 4 extremities. Grossly normal sensation. Normal finger to nose.      (all labs ordered are listed, but only abnormal results are displayed) Labs Reviewed  COMPREHENSIVE METABOLIC PANEL WITH GFR - Abnormal; Notable for the following components:      Result Value   Glucose, Bld 189 (*)    All other components within normal limits  URINALYSIS, ROUTINE W REFLEX MICROSCOPIC - Abnormal; Notable for the following components:   APPearance HAZY (*)    Leukocytes,Ua LARGE (*)    All other components within normal limits  CBC  CBG MONITORING, ED  TROPONIN T, HIGH SENSITIVITY    EKG: EKG Interpretation Date/Time:  Monday March 09 2024 15:35:29 EDT Ventricular Rate:  69 PR Interval:  162 QRS Duration:  122 QT Interval:  414 QTC Calculation: 443 R Axis:   75  Text Interpretation: Normal sinus rhythm with sinus arrhythmia Right bundle branch block Confirmed by Freddi Hamilton [phone]) on 03/09/2024 4:03:24 PM  Radiology: [facility] Chest 2 View Result Date: 03/09/2024 CLINICAL DATA:  dyspnea, weakness EXAM: CHEST - 2 VIEW COMPARISON:  02/23/2022 FINDINGS: No focal airspace  consolidation, pleural effusion, or pneumothorax. No cardiomegaly. Moderate-sized hiatal hernia containing layering fluid. Tortuous aorta with aortic atherosclerosis. No acute fracture or destructive lesions. Multilevel thoracic osteophytosis. Vertebroplasty cement at the thoracolumbar junction. Cholecystectomy clips. IMPRESSION: 1. No acute cardiopulmonary abnormality. 2. Moderate-sized hiatal hernia. Electronically Signed   By: Rogelia Myers M.D.   On: 03/09/2024 17:24     Procedures   Medications Ordered in the ED  lactated ringers  bolus 500 mL (0 mLs Intravenous Stopped 03/09/24 1850)                                    Medical Decision Making Amount and/or Complexity of Data Reviewed Labs: ordered.    Details: Normal troponin, unremarkable electrolytes and hemoglobin Radiology: ordered and  independent interpretation performed.    Details: No pneumonia ECG/medicine tests: ordered and independent interpretation performed.    Details: No ischemia   Patient presents with generalized weakness and transient hypotension.  Blood pressures have been better here.  Still little bit of a low diastolic but actually seem to come up with some fluids.  She feels fine.  He is on treatment for UTI, urine culture is supposed to come back today but has not yet according to daughter.  It is unclear what caused her transient lightheadedness and low blood pressure.  Generalized weakness could be from the UTI.  However my suspicion this is pyelonephritis is pretty low.  She is currently on Macrobid , I do not think I would change this without culture data, highly doubt sepsis as a cause of a transiently low blood pressure with otherwise unremarkable workup.  This occurred many hours ago and with a negative troponin, I think ACS is pretty unlikely.  I did discuss with patient and daughters, we could consider admitting overnight for observation, including that fact that she had some lightheadedness/near syncope.   However family and patient want to go home which I think is reasonable.  However I discussed that she should follow-up closely with her PCP and return if any new or worsening symptoms arise.  Low suspicion for ACS, PE, sepsis, etc.     Final diagnoses:  Generalized weakness    ED Discharge Orders     None          Freddi Hamilton, MD 03/09/24 2150

## 2024-03-09 NOTE — ED Triage Notes (Signed)
 Pt POV with family reporting SOB, fatigue and hypotension today, being treated for UTI, denies n/v/d.

## 2024-03-09 NOTE — ED Notes (Signed)
 Reviewed discharge instructions and home care with pt and family. Both verbalized understanding and had no further questions. Pt exited ED without complications.

## 2024-03-09 NOTE — Discharge Instructions (Signed)
 Your lab work today is reassuring.  Be sure you drink amount of fluids and eat appropriately.  You may measure your blood pressure a couple times a day and record the readings for your primary care provider.  If you develop new or worsening weakness, chest pain, shortness of breath, blood pressure abnormalities such as blood pressure under 90, or any other new/concerning symptoms then return to the ER.

## 2024-03-10 ENCOUNTER — Ambulatory Visit: Payer: Self-pay | Admitting: Physician Assistant

## 2024-03-11 ENCOUNTER — Encounter: Payer: Self-pay | Admitting: Family Medicine

## 2024-03-13 ENCOUNTER — Telehealth: Payer: Self-pay | Admitting: Physician Assistant

## 2024-03-13 NOTE — Telephone Encounter (Signed)
 Spoke to patient's daughter Today has been a bit of a better day, better BP and HR ER record reviewed, labs decent, SR Over the prior few days BP and HR fluctuating with low BP elevated rates worrisome. With the low BPs > patient feel weak, poorly. Has been taking her scheduled BID metoprolol , no PRN doses with concerns of BP  Advised she reduce metoprolol  to 12.5mg  BID to allow better BP and follow her HR. OK to use PRN 1/w tab if SBP is >110 for elevated HRs  At their visit with Dr. Wonda 8/18, seemed game plan was not to purse further AAd/rhythm control But perhaps we can re-consider amio.  She sees Dr. Almetta Tuesday

## 2024-03-16 ENCOUNTER — Other Ambulatory Visit: Payer: Self-pay | Admitting: *Deleted

## 2024-03-16 DIAGNOSIS — Z5181 Encounter for therapeutic drug level monitoring: Secondary | ICD-10-CM

## 2024-03-16 DIAGNOSIS — R531 Weakness: Secondary | ICD-10-CM

## 2024-03-16 DIAGNOSIS — R0602 Shortness of breath: Secondary | ICD-10-CM

## 2024-03-16 LAB — COMPREHENSIVE METABOLIC PANEL WITH GFR
ALT: 14 IU/L (ref 0–32)
AST: 20 IU/L (ref 0–40)
Albumin: 3.8 g/dL (ref 3.6–4.6)
Alkaline Phosphatase: 126 IU/L — ABNORMAL HIGH (ref 44–121)
BUN/Creatinine Ratio: 25 (ref 12–28)
BUN: 18 mg/dL (ref 10–36)
Bilirubin Total: 0.3 mg/dL (ref 0.0–1.2)
CO2: 21 mmol/L (ref 20–29)
Calcium: 9.1 mg/dL (ref 8.7–10.3)
Chloride: 104 mmol/L (ref 96–106)
Creatinine, Ser: 0.71 mg/dL (ref 0.57–1.00)
Globulin, Total: 2.3 g/dL (ref 1.5–4.5)
Glucose: 133 mg/dL — ABNORMAL HIGH (ref 70–99)
Potassium: 3.7 mmol/L (ref 3.5–5.2)
Sodium: 140 mmol/L (ref 134–144)
Total Protein: 6.1 g/dL (ref 6.0–8.5)
eGFR: 80 mL/min/1.73 (ref >59)

## 2024-03-16 LAB — IRON,TIBC AND FERRITIN PANEL
Ferritin: 28 ng/mL (ref 15–150)
Iron Saturation: 10 % — ABNORMAL LOW (ref 15–55)
Iron: 34 ug/dL (ref 27–139)
Total Iron Binding Capacity: 326 ug/dL (ref 250–450)
UIBC: 292 ug/dL (ref 118–369)

## 2024-03-16 LAB — CBC WITH DIFFERENTIAL/PLATELET
Basophils Absolute: 0.1 x10E3/uL (ref 0.0–0.2)
Basos: 1 %
EOS (ABSOLUTE): 0.1 x10E3/uL (ref 0.0–0.4)
Eos: 1 %
Hematocrit: 38.6 % (ref 34.0–46.6)
Hemoglobin: 12.3 g/dL (ref 11.1–15.9)
Immature Grans (Abs): 0 x10E3/uL (ref 0.0–0.1)
Immature Granulocytes: 0 %
Lymphocytes Absolute: 1.2 x10E3/uL (ref 0.7–3.1)
Lymphs: 16 %
MCH: 28.3 pg (ref 26.6–33.0)
MCHC: 31.9 g/dL (ref 31.5–35.7)
MCV: 89 fL (ref 79–97)
Monocytes Absolute: 0.7 x10E3/uL (ref 0.1–0.9)
Monocytes: 8 %
Neutrophils Absolute: 5.9 x10E3/uL (ref 1.4–7.0)
Neutrophils: 74 %
Platelets: 195 x10E3/uL (ref 150–450)
RBC: 4.34 x10E6/uL (ref 3.77–5.28)
RDW: 14.4 % (ref 11.7–15.4)
WBC: 7.9 x10E3/uL (ref 3.4–10.8)

## 2024-03-16 LAB — TSH: TSH: 1.81 u[IU]/mL (ref 0.450–4.500)

## 2024-03-16 LAB — MAGNESIUM

## 2024-03-16 NOTE — Progress Notes (Unsigned)
 Cardiology Office Note   Date:  03/17/2024  ID:  Samantha Foley, Samantha Foley [birth year], MRN [id number] PCP: Randol Dawes, MD  [facility] Providers Cardiologist:  Elspeth Sage, MD Electrophysiologist:  Donnice DELENA Primus, MD    History of Present Illness Samantha Foley is a 89 y.o. female with paroxysmal AF and HLD who presents for AF management.  She has had several episodes of symptomatic atrial fibrillation where she feels profound fatigue, malaise and weakness.  On 09/04 she had significant weakness and was evaluated for possible UTI however her evaluation was unremarkable.  Around a week later she was still having similar symptoms and was evaluated in the emergency department where she was mildly hypertensive.  Her heart rate prior to arrival was in the 130s-140s however by the time she arrived it had normalized to the 60s.  She was on metoprolol  25 mg twice daily which was cut back to 12.5 mg twice daily and her symptoms seem to improve gradually.  She denied any chest pain but was having some mild shortness of breath.  Today she presents with her daughter who has recently retired in July as had of school at [employer]. She helps take care of her mother.   Samantha Foley is feeling fairly well today and her stamina has improved over the past few weeks.  She is not completely back to her normal but does feel much better than she did before being evaluated in the emergency department recently.  She is having brief episodes of atrial fibrillation and seems to be symptomatic when she is in RVR.  She has not required cardioversion or other interventions.  ROS: fatigue, malaise   Studies Reviewed  ECG review 03/11/24: NSR 69, PR 162, QRS 122, QT/c 414/443, RBBB  TTE Result date: 03/06/24 1. Left ventricular ejection fraction, by estimation, is 60 to 65%. The left ventricle has normal function. The left ventricle has no regional wall motion abnormalities. There is mild  concentric left ventricular hypertrophy. Left ventricular diastolic parameters are consistent with Grade I diastolic dysfunction (impaired relaxation). 2. Right ventricular systolic function is normal. The right ventricular size is normal. There is normal pulmonary artery systolic pressure. The estimated right ventricular systolic pressure is 27.8 mmHg. 3. The mitral valve is normal in structure. Mild mitral valve regurgitation. No evidence of mitral stenosis. 4. The aortic valve is tricuspid. Aortic valve regurgitation is mild to moderate. No aortic stenosis is present. 5. The inferior vena cava is normal in size with greater than 50% respiratory variability, suggesting right atrial pressure of 3 mmHg.  Risk Assessment/Calculations  CHA2DS2-VASc Score = 3  This indicates a 3.2% annual risk of stroke. The patient's score is based upon: CHF History: 0 HTN History: 0 Diabetes History: 0 Stroke History: 0 Vascular Disease History: 0 Age Score: 2 Gender Score: 1  Physical Exam VS:  BP (!) 144/80   Pulse 71   Ht 5' 3 (1.6 m)   Wt 150 lb (68 kg)   SpO2 95%   BMI 26.57 kg/m       Wt Readings from Last 3 Encounters:  03/17/24 150 lb (68 kg)  03/04/24 155 lb (70.3 kg)  02/10/24 154 lb 12.8 oz (70.2 kg)    GEN: Well nourished, well developed in no acute distress NECK: No JVD; No carotid bruits CARDIAC: RRR, no murmurs, rubs, gallops RESPIRATORY:  Clear to auscultation without rales, wheezing or rhonchi  ABDOMEN: Soft, non-tender, non-distended EXTREMITIES:  No edema; No  deformity   ASSESSMENT AND PLAN Samantha Foley is a 89 y.o. female with paroxysmal AF and HLD who presents for AF management.  Paroxysmal AF We reviewed the pathophysiology of atrial fibrillation and, natural progression of the disease and associated symptoms.  She seems to be most symptomatic when she is in RVR and has been trialed on flecainide  however with her intermittent RBBB and some QRS widening on prior  ECGs it was subsequently discontinued.  At one point she had taken additional flecainide  on accident and it was held.  She is currently on metoprolol  to tartrate 12.5 mg twice daily.  We discussed different options for ongoing management including consideration of amiodarone  as a way to try and maintain sinus rhythm.  She does not have a significant burden of atrial fibrillation but does have profound symptoms when she is in RVR prompting medical evaluation.  I discussed that as long as she continues her anticoagulation even when she is in AF/RVR it usually is something that can be managed at home since her rates are relatively well-controlled and she is only self converted.  She is only having brief episodes every few weeks so her and her daughter do not feel that a preventative medication to try and reduce rhythm recurrence is needed quite yet.  We reviewed the risk, benefits, and alternatives of amiodarone  initiation should her AF become more disruptive.  We also discussed the natural progression of atrial fibrillation and how without antiarrhythmic medications will likely become more persistent with time.  For now she will continue on metoprolol  tartrate 12.5 mg twice daily with as needed doses if her heart rate is greater than 130.  She wanted to follow-up in 3 months to ensure that there were no changes that need to be made and that we can see her yearly thereafter.  A total of 35 minutes was spent preparing for the patient, reviewing history, performing exam, document encounter, coordinating care and counseling the patient. 25 minutes was spent with direct patient care.    Dispo: f/u 3 months, no changes today   Signed, Donnice DELENA Primus, MD

## 2024-03-17 ENCOUNTER — Ambulatory Visit
Attending: Student in an Organized Health Care Education/Training Program | Admitting: Student in an Organized Health Care Education/Training Program

## 2024-03-17 ENCOUNTER — Encounter: Payer: Self-pay | Admitting: Student in an Organized Health Care Education/Training Program

## 2024-03-17 ENCOUNTER — Other Ambulatory Visit

## 2024-03-17 VITALS — BP 144/80 | HR 71 | Ht 63.0 in | Wt 150.0 lb

## 2024-03-17 DIAGNOSIS — I480 Paroxysmal atrial fibrillation: Secondary | ICD-10-CM

## 2024-03-17 NOTE — Patient Instructions (Signed)
 Medication Instructions:  Your physician recommends that you continue on your current medications as directed. Please refer to the Current Medication list given to you today.  *If you need a refill on your cardiac medications before your next appointment, please call your pharmacy*  Lab Work: None ordered.  If you have labs (blood work) drawn today and your tests are completely normal, you will receive your results only by: MyChart Message (if you have MyChart) OR A paper copy in the mail If you have any lab test that is abnormal or we need to change your treatment, we will call you to review the results.  Testing/Procedures: None ordered.   Follow-Up: At [facility], you and your health needs are our priority.  As part of our continuing mission to provide you with exceptional heart care, our providers are all part of one team.  This team includes your primary Cardiologist (physician) and Advanced Practice Providers or APPs (Physician Assistants and Nurse Practitioners) who all work together to provide you with the care you need, when you need it.  Your next appointment:   3 months with Dr Almetta

## 2024-03-18 ENCOUNTER — Encounter: Payer: Self-pay | Admitting: Family Medicine

## 2024-03-18 NOTE — Telephone Encounter (Signed)
 Fine to r/s Mg level to Tuesday

## 2024-03-23 ENCOUNTER — Telehealth: Payer: Self-pay | Admitting: Student in an Organized Health Care Education/Training Program

## 2024-03-23 DIAGNOSIS — I480 Paroxysmal atrial fibrillation: Secondary | ICD-10-CM

## 2024-03-23 MED ORDER — AMIODARONE HCL 200 MG PO TABS: 200.0000 mg | ORAL_TABLET | Freq: Two times a day (BID) | ORAL | 3 refills | Status: AC

## 2024-03-23 NOTE — Telephone Encounter (Signed)
 Having rate controlled AF, very symptomatic although low burden. D/c metoprolol  tartrate 12.5 mg bid with initiation of amiodarone . Having AF/VR 40-70s. Start amio 200 mg bid followed by 200 mg daily thereafter. Labs recently checked and WNLs.  Donnice DELENA Primus, MD [facility]  Cardiac Electrophysiology

## 2024-03-23 NOTE — Telephone Encounter (Signed)
 Daughter following up. Would like to make sure someone will contact her soon regarding this matter. Please advise.

## 2024-03-24 ENCOUNTER — Other Ambulatory Visit

## 2024-03-25 ENCOUNTER — Telehealth: Payer: Self-pay | Admitting: Student

## 2024-03-25 NOTE — Telephone Encounter (Signed)
   Patient's daughter, Luke, called after-hours Answering Service with concerns that patient is in atrial fibrillation.  Called and spoke with daughter (okay per DPR).  She stated patient was recently started on Amiodarone  2 days ago for atrial fibrillation and Lopressor  was stopped.  Daughter states that patient still is not feeling well.  She is having significant fatigue with the atrial fibrillation as well as some shortness of breath but this is stable.  No chest pain or severe lightheadedness/dizziness.  No syncope.  Daughter was wondering if she should give the patient a dose of Lopressor .  Heart rates are currently ranging in the 60s to 90s but systolic BP was 97 about 20 minutes ago.  I did not recommend patient take Lopressor  given heart rate falls in normal range and BP is soft.  Explained that it can take a while for the Amiodarone  to get loaded into her system (it was just started on 9/29).  Would continue current plan for now.  Discussed ED precautions.  Daughter voiced understanding and thanked me for calling.  Aaliyha Mumford E Laverle Pillard, PA-C 03/25/2024 5:55 PM

## 2024-03-26 ENCOUNTER — Encounter: Payer: Self-pay | Admitting: Family Medicine

## 2024-03-28 ENCOUNTER — Other Ambulatory Visit: Payer: Self-pay | Admitting: Internal Medicine

## 2024-03-28 DIAGNOSIS — I480 Paroxysmal atrial fibrillation: Secondary | ICD-10-CM

## 2024-03-30 NOTE — Telephone Encounter (Signed)
 Prescription refill request for Eliquis  received. Indication:afib Last office visit:9/25 Scr:0.82  9/25 Age: 89 Weight:68  kg  Prescription refilled

## 2024-04-17 DIAGNOSIS — H353231 Exudative age-related macular degeneration, bilateral, with active choroidal neovascularization: Secondary | ICD-10-CM | POA: Diagnosis not present

## 2024-04-19 ENCOUNTER — Other Ambulatory Visit: Payer: Self-pay | Admitting: Family Medicine

## 2024-04-19 DIAGNOSIS — I700 Atherosclerosis of aorta: Secondary | ICD-10-CM

## 2024-04-19 DIAGNOSIS — E7800 Pure hypercholesterolemia, unspecified: Secondary | ICD-10-CM

## 2024-05-13 ENCOUNTER — Encounter: Payer: Self-pay | Admitting: Family Medicine

## 2024-05-13 ENCOUNTER — Ambulatory Visit: Payer: Self-pay

## 2024-05-13 ENCOUNTER — Ambulatory Visit: Admitting: Family Medicine

## 2024-05-13 VITALS — BP 128/84 | HR 80 | Temp 100.0°F | Ht 63.0 in | Wt 155.4 lb

## 2024-05-13 DIAGNOSIS — J069 Acute upper respiratory infection, unspecified: Secondary | ICD-10-CM | POA: Diagnosis not present

## 2024-05-13 DIAGNOSIS — R051 Acute cough: Secondary | ICD-10-CM

## 2024-05-13 DIAGNOSIS — M810 Age-related osteoporosis without current pathological fracture: Secondary | ICD-10-CM | POA: Diagnosis not present

## 2024-05-13 DIAGNOSIS — R509 Fever, unspecified: Secondary | ICD-10-CM | POA: Diagnosis not present

## 2024-05-13 LAB — POC COVID19 BINAXNOW: SARS Coronavirus 2 Ag: NEGATIVE

## 2024-05-13 LAB — POCT INFLUENZA A/B
Influenza A, POC: NEGATIVE
Influenza B, POC: NEGATIVE

## 2024-05-13 NOTE — Progress Notes (Signed)
 Chief Complaint  Patient presents with   Nasal Congestion    Phlegm in her throat throat that developed 2 days ago, not in her sinuses or her chest. White/clear in color. Taking 12hr mucinex. Having a lot of neck pain, no fever. Does not feel sick, just clearing her throat a lot.    She is accompanied by her daughter Luke today.  Patient has had increased phlegm for the last 2 days. Having to constantly clear her throat. Phlegm is very thick, choking on it. Denies sneezing. She has nasal congestion, PND.  Phlegm is yellow, sometimes white with brown. It is very thick. Not able to blow much from her nose.  Only occasional cough. Mainly she is trying hard to clear her throat. Denies chest congestion. She is mouth breathing, can't breathe out of her nose.  No shortness of breath related to her lungs.  She started taking mucinex yesterday. She has been drinking herbal tea.  She uses Flonase . She can't press on the saline bottles for use of nasal saline spray.  Osteoporosis:  They report today that she took 3 months of Evenity --cost was $500/month (was told it was going to be $5).  This is not affordable, and they are paying off the treatment she had so far. This was through rheum ([facility]).  She isn't on anything now, asking if she should go back on Prolia .  Last dose of Evenity  was a few months ago, per Luke and patient.  PMH, PSH, SH reviewed  Outpatient Encounter Medications as of 05/13/2024  Medication Sig Note   acetaminophen  (TYLENOL ) 500 MG tablet Take 1,000 mg by mouth every 6 (six) hours as needed for mild pain (pain score 1-3) or headache. 05/13/2024: As needed   amiodarone  (PACERONE ) 200 MG tablet Take 1 tablet (200 mg total) by mouth 2 (two) times daily. Take 200 mg twice daily x 14 days followed by 200 mg daily thereafter    ELIQUIS  5 MG TABS tablet TAKE 1 TABLET BY MOUTH TWICE A DAY    estradiol (ESTRACE) 0.1 MG/GM vaginal cream Place 1 Applicatorful vaginally 3 (three)  times a week.    fexofenadine (ALLEGRA) 180 MG tablet Take 180 mg by mouth daily.    fluticasone  (FLONASE ) 50 MCG/ACT nasal spray Place 2 sprays into both nostrils daily as needed for allergies or rhinitis. 05/13/2024: Once daily, in the am    guaiFENesin (MUCINEX) 600 MG 12 hr tablet Take 1 tablet by mouth 2 (two) times daily.    HYDROcodone -acetaminophen  (NORCO/VICODIN) 5-325 MG tablet Take 1 tablet by mouth every 6 (six) hours as needed for moderate pain (pain score 4-6). 05/13/2024: Took twice yesterday for neck pain   lidocaine  (XYLOCAINE ) 5 % ointment Apply 1 Application topically 3 (three) times daily as needed for moderate pain (pain score 4-6). 05/13/2024: As needed     Multiple Vitamin (MULTIVITAMIN WITH MINERALS) TABS tablet Take 1 tablet by mouth daily.    Multiple Vitamins-Minerals (ICAPS AREDS 2 PO) Take 1 capsule by mouth daily.    omeprazole  (PRILOSEC) 20 MG capsule TAKE 1 CAPSULE BY MOUTH EVERY DAY    rosuvastatin  (CRESTOR ) 10 MG tablet TAKE 1 TABLET BY MOUTH EVERY DAY    vitamin B-12 (CYANOCOBALAMIN) 1000 MCG tablet Take 1 tablet by mouth daily.    Magnesium  Glycinate 120 MG CAPS Take 120 mg by mouth as needed. (Patient not taking: Reported on 05/13/2024) 03/04/2024: Taking right before going to bed, not helping   [DISCONTINUED] gabapentin  (NEURONTIN ) 100 MG  capsule Take 100 mg by mouth at bedtime as needed (Sleep issues). (Patient not taking: Reported on 03/17/2024) 03/04/2024: As needed, has never used   [DISCONTINUED] nitrofurantoin , macrocrystal-monohydrate, (MACROBID ) 100 MG capsule Take 1 capsule (100 mg total) by mouth 2 (two) times daily.    No facility-administered encounter medications on file as of 05/13/2024.   Allergies  Allergen Reactions   Apixaban  Other (See Comments)    fatigue  Other Reaction(s): confusion, Dizziness, Other (See Comments)  Fatigue   Ciprofloxacin Shortness Of Breath and Rash   Scallops [Shellfish Allergy] Anaphylaxis   Doxycycline  Nausea  And Vomiting and Nausea Only    Other Reaction(s): GI Intolerance, Headache   Penicillins Other (See Comments)    Did it involve swelling of the face/tongue/throat, SOB, or low BP? Unknown  Did it involve sudden or severe rash/hives, skin peeling, or any reaction on the inside of your mouth or nose? Unknown  Did you need to seek medical attention at a hospital or doctor's office? Unknown  When did it last happen?        If all above answers are "NO", may proceed with cephalosporin use.  Other Reaction(s): Other (See Comments), Unknown  Childhood reaction unknown   Rivaroxaban  Other (See Comments)    Pt unsure but d/c use  Other Reaction(s): Other (See Comments)  Reaction unknown   Demerol Other (See Comments)    Patient states it made her crazy and sleep for 12 hours   Milk-Related Compounds Diarrhea   Sudafed [Pseudoephedrine Hcl] Other (See Comments)    hyperactive   Cherry Rash and Other (See Comments)    Vertigo    ROS: Denies any known fever or chills. (Had normal temp at home yesterday). Denies tachycardia, palpitations, chest pain. URI symptoms per HPI. No n/v/d Denies urinary symptoms. No bleeding, rash. Moods are good. Has some neck pain.    PHYSICAL EXAM:  BP 128/84   Pulse 80   Temp 100 F (37.8 C) (Tympanic)   Ht 5' 3 (1.6 m)   Wt 155 lb 6.4 oz (70.5 kg)   SpO2 96%   BMI 27.53 kg/m   Pleasant, well-appearing female in no distress HEENT; conjunctiva and sclera are clear, EOMI. TM's not examined, wearing hearing aids. Nasal mucosa has an area of recent bleeding in R nares. No active bleeding. L side--mild-mod edema with white mucus present. No erythema, no sinus tenderness. OP is clear. Neck: no lymphadenopathy or mass.  +lipoma L posterior neck, nontender.  Slightly tender to left of spine (medial to lipoma). Heart: regular rate and rhythm Lungs clear Neuro: alert and oriented, cranial nerves grossly intact. Skin: normal turgor, no visible  lesions  Influenza and COVID antigen tests negative.   ASSESSMENT/PLAN:  Viral upper respiratory illness - supportive measures reviewed, s/sx bacterial infection. Mucinex, sinus rinses, nasal saline. Consider repeat COVID test tomorrow  Osteoporosis without current pathological fracture, unspecified osteoporosis type - s/p Evenity  treatments x3 through rheum, not affordable. Needs to switch back to Prolia  and prefers to do through our office  Acute cough - Plan: POC COVID-19 BinaxNow, Influenza A/B  Fever, unspecified fever cause - Plan: POC COVID-19 BinaxNow, Influenza A/B  I spent 33 minutes dedicated to the care of this patient, including pre-visit review of records, face to face time, post-visit ordering of testing and documentation.   Stay well hydrated (aim for pale urine). Continue the mucinex round the clock, as this helps loosen up the mucus and phlegm. We discussed using nasal saline and  sinuses rinses (sinus rinse kit or Neti-pot)--just remember to use distilled or boiled water, not tap water. You can do this twice daily.  Contact us  if you have persistent fevers, if you develop worsening cough, breathing, persistently discolored mucus or phlegm, or if you develop sinus headaches. I suspect you might get worse before getting better. Listen to your body and rest if needed.  Consider repeating a COVID test at home in 1-2 days (it is possible that today's test was too early to accurately be negative). If you have a positive, we would recommend treatment with Paxlovid (if it is within 5 days of the onset of symptoms).  We will try and get you set up to restart the Prolia  injections.

## 2024-05-13 NOTE — Telephone Encounter (Signed)
 FYI Only or Action Required?: FYI only for provider: appointment scheduled on 11/19.  Patient was last seen in primary care on 03/04/2024 by Randol Dawes, MD.  Called Nurse Triage reporting Cough.  Symptoms began several days ago.  Interventions attempted: Rest, hydration, or home remedies.  Symptoms are: gradually worsening.  Triage Disposition: See Physician Within 24 Hours  Patient/caregiver understands and will follow disposition?: Yes  Copied from CRM #[id number]. Topic: Clinical - Red Word Triage >> May 13, 2024  9:35 AM Carlyon D wrote: Red Word that prompted transfer to Nurse Triage: Pt daughter Miss Luke is calling stating pt is very congested to the point mom can't breathe and is choking on her phlegm. Reason for Disposition  SEVERE coughing spells (e.g., whooping sound after coughing, vomiting after coughing)  Answer Assessment - Initial Assessment Questions Spoke with daughter- patient has had increased phlegm for the last 2 days. Having to constantly clear her throat and choking on it. Has been up since 4am. Daughter is not with pt currently but headed over there. She will bring pulse ox to check her oxygen. When she saw her last night there was no facial asymmetry or tongue deviation. Patient was oriented. No abnormal color to the phlegm.  Advised ED/UC precautions and to check BP and O2 when arriving to the house. Appt with PCP this afternoon to assess.   1. ONSET: When did the cough begin?      2 days of symptoms  2. SEVERITY: How bad is the cough today?      Mild cough- but has lots of phlegm  3. SPUTUM: Describe the color of your sputum (e.g., none, dry cough; clear, white, yellow, green)     Thick enough to choke on  4. HEMOPTYSIS: Are you coughing up any blood? If Yes, ask: How much? (e.g., flecks, streaks, tablespoons, etc.)     denies 5. DIFFICULTY BREATHING: Are you having difficulty breathing? If Yes, ask: How bad is it? (e.g., mild, moderate, severe)       Mild difficulty  6. FEVER: Do you have a fever? If Yes, ask: What is your temperature, how was it measured, and when did it start?     denies 7. CARDIAC HISTORY: Do you have any history of heart disease? (e.g., heart attack, congestive heart failure)      A FIb 8. LUNG HISTORY: Do you have any history of lung disease?  (e.g., pulmonary embolus, asthma, emphysema)     denies 9. PE RISK FACTORS: Do you have a history of blood clots? (or: recent major surgery, recent prolonged travel, bedridden)     denies 10. OTHER SYMPTOMS: Do you have any other symptoms? (e.g., runny nose, wheezing, chest pain)       denies  Protocols used: Cough - Acute Productive-A-AH

## 2024-05-13 NOTE — Patient Instructions (Signed)
 Stay well hydrated (aim for pale urine). Continue the mucinex round the clock, as this helps loosen up the mucus and phlegm. We discussed using nasal saline and sinuses rinses (sinus rinse kit or Neti-pot)--just remember to use distilled or boiled water, not tap water. You can do this twice daily.  Contact us  if you have persistent fevers, if you develop worsening cough, breathing, persistently discolored mucus or phlegm, or if you develop sinus headaches. I suspect you might get worse before getting better. Listen to your body and rest if needed.  Consider repeating a COVID test at home in 1-2 days (it is possible that today's test was too early to accurately be negative). If you have a positive, we would recommend treatment with Paxlovid (if it is within 5 days of the onset of symptoms).

## 2024-05-14 ENCOUNTER — Other Ambulatory Visit (HOSPITAL_COMMUNITY): Payer: Self-pay | Admitting: Family Medicine

## 2024-05-14 ENCOUNTER — Telehealth (HOSPITAL_COMMUNITY): Payer: Self-pay | Admitting: Pharmacy Technician

## 2024-05-14 ENCOUNTER — Encounter: Payer: Self-pay | Admitting: Family Medicine

## 2024-05-14 NOTE — Progress Notes (Signed)
 Placed order for Prolia  to [facility] on [address]

## 2024-05-14 NOTE — Addendum Note (Signed)
 Addended by: VICCI HUSBAND A on: 05/14/2024 08:42 AM   Modules accepted: Orders

## 2024-05-14 NOTE — Telephone Encounter (Signed)
 Auth Submission: NO AUTH NEEDED Site of care: [facility] Payer: [employer] Medication & CPT/J Code(s) submitted: Prolia  (Denosumab ) [id number] Diagnosis Code: M81.0 Route of submission (phone, fax, portal):  Phone # Fax # Auth type: Buy/Bill HB Units/visits requested: 60mg  x 2 doses, q 6 months Reference number:  Approval from: 05/14/2024 to 06/24/24    Dagoberto Armour, CPhT [facility] Phone: [phone] 05/14/2024

## 2024-05-26 NOTE — Patient Instructions (Incomplete)
  Some memory and word finding problems are common with healthy aging.  Persons who still have good memory into their late 80s share three main lifestyle attributes.    They engage in regular physical exercise. They maintain good social connections. They seek out difficult memory and thinking challenges regularly (e.g. learning a new language or learning to play a musical instrument).  A heart healthy diet (like the Mediterranean diet) can help reduce your risk of heart disease and stroke. It may also help you retain memory and thinking skills into later life.  There are many treatments, activities and products that are touted to be memory aids or memory preservation strategies. Unfortunately, there is little convincing evidence that any of these make a measurable difference in preserving or improving skills for everyday living.    Sleep is very important to help with memory. Try having a more rigid sleep schedule--waking up the same time no matter what time you fell asleep. Try and avoid naps--if needed, no more than 20 minute naps.  Please look at the previous handouts given on sleep hygiene (under insomnia handout).  We discussed trying low dose melatonin 2-3 hours before bedtime, with a second dose at bedtime if you aren't sleepy. I saw no interaction with eliquis  on my app--double check with a pharmacist.  Try and get some regular activity during the day.  Belsomra is an option--this is a prescription sleep medication that is considered the safest to use in folks over 65.

## 2024-05-26 NOTE — Progress Notes (Unsigned)
 No chief complaint on file.  Patient presents with memory concerns. She is accompanied by her daughter Luke.   She was seen in September with complaints of with fatigue and excessive daytime sleepiness. She had noted worsening memory for a few months at that visit, and reported she was taking a B12 supplement. She also reported taking Mg at bedtime to help with sleep, though hadn't noted improvement. She has atrial fibrillation, and needed extra doses of BB related to tachycardia around the time of that visit. She noted improvement in fatigue once her metoprolol  dose was decreased to 12.5 mg BID by her cardiologist.  Urine was abnormal, but culture didn't show infection (Mixed urogenital flora, 20-50K cfu). CBC, c-met and TSH were checked and were unremarkable.  Mg was supposed to be checked, but there was a lab issue (couldn't run test).   Osteoporosis:  She reportedly took 3 months of Evenity , stopped due to cost being $500/month (was told it was going to be $5).  This is not affordable, and they are paying off the treatments she had so far. This was through rheum ([facility]).  This was mentioned at her recent acute visit, and she has since been set up to resume Prolia  injection, scheduled at the infusion center for later this month.   PMH, PSH, SH reviewed   ROS:    PHYSICAL EXAM:  There were no vitals taken for this visit.  Wt Readings from Last 3 Encounters:  05/13/24 155 lb 6.4 oz (70.5 kg)  03/17/24 150 lb (68 kg)  03/04/24 155 lb (70.3 kg)       ASSESSMENT/PLAN:   MMSE  B12, Mg  Had normal TSH in 02/2024   Belated birthday wish    Imaging is typically not needed in cases of gradual memory decline in older adults, with normal neurological exam and no red ?ags.   Indications for imaging (MRI brain without contrast OR CT head without contrast, if MRI not possible):  Rapid (e.g., 1 or 2 months) unexplained decline in cognition or function  "Short"  duration of dementia (less than 2 years)  Recent signi?cant head trauma  Unexplained neurological symptoms (e.g., new onset of severe headache or seizures)  History of cancer (especially in sites and types that metastasize to the brain)  Use of anticoagulants or history of bleeding disorder  History of urinary incontinence and gait disorder early in the course of dementia  Any new localizing sign (e.g., hemiparesis)  Unusual or atypical cognitive symptoms or presentation (e.g., progressive aphasia)   Gait disturbance  Management  While anticholinesterase inhibitors such as donepezil and rivastigmine are FDA approved for Alzheimer's dementia, their effect is minimal There is no clear recommendation in use for mild cognitive impairment If choosing medication, donepezil is the preferred formulary agent. Start at 5mg  QHS, then increase to 10mg  in 2-3 weeks if tolerated. Side effects to look for: bradycardia, dizziness, diarrhea, and nausea/vomiting are the most common Memantine can also be used for moderate to severe dementia, starting with memantine 5mg  daily and titrating up in 5mg  increments to a max of 10mg  BID Treat depression and anxiety if identified. See Depression Guideline. If a patient would like to pursue consideration for anti-amyloid therapy, please refer to Neurology and specify that it is for anti-amyloid therapy In all cases: Emphasize the importance of regular exercise, social interaction, cessation of smoking and recreational substance use, and control of cardiovascular risk factors.?Use SmartPhrase .BRAINHEALTH in patient instructions

## 2024-05-27 ENCOUNTER — Encounter: Payer: Self-pay | Admitting: Family Medicine

## 2024-05-27 ENCOUNTER — Ambulatory Visit: Admitting: Family Medicine

## 2024-05-27 VITALS — BP 130/60 | HR 80 | Ht 63.0 in | Wt 159.0 lb

## 2024-05-27 DIAGNOSIS — M810 Age-related osteoporosis without current pathological fracture: Secondary | ICD-10-CM | POA: Diagnosis not present

## 2024-05-27 DIAGNOSIS — R4189 Other symptoms and signs involving cognitive functions and awareness: Secondary | ICD-10-CM | POA: Diagnosis not present

## 2024-05-27 DIAGNOSIS — G4700 Insomnia, unspecified: Secondary | ICD-10-CM | POA: Diagnosis not present

## 2024-05-27 DIAGNOSIS — R413 Other amnesia: Secondary | ICD-10-CM

## 2024-05-27 DIAGNOSIS — I480 Paroxysmal atrial fibrillation: Secondary | ICD-10-CM | POA: Diagnosis not present

## 2024-05-27 MED ORDER — BELSOMRA 10 MG PO TABS: ORAL_TABLET | ORAL | 1 refills | Status: AC

## 2024-05-28 ENCOUNTER — Ambulatory Visit: Payer: Self-pay | Admitting: Family Medicine

## 2024-05-28 LAB — VITAMIN B12: Vitamin B-12: >2000 pg/mL — ABNORMAL HIGH (ref 232–1245)

## 2024-05-29 ENCOUNTER — Encounter: Payer: Self-pay | Admitting: Family Medicine

## 2024-06-05 DIAGNOSIS — H353231 Exudative age-related macular degeneration, bilateral, with active choroidal neovascularization: Secondary | ICD-10-CM | POA: Diagnosis not present

## 2024-06-09 ENCOUNTER — Ambulatory Visit (HOSPITAL_COMMUNITY)
Admission: RE | Admit: 2024-06-09 | Discharge: 2024-06-09 | Disposition: A | Source: Ambulatory Visit | Attending: Family Medicine

## 2024-06-09 VITALS — BP 138/62 | HR 63 | Temp 96.5°F | Resp 16

## 2024-06-09 DIAGNOSIS — M810 Age-related osteoporosis without current pathological fracture: Secondary | ICD-10-CM | POA: Insufficient documentation

## 2024-06-09 MED ORDER — DENOSUMAB 60 MG/ML ~~LOC~~ SOSY
PREFILLED_SYRINGE | SUBCUTANEOUS | Status: AC
  Filled 2024-06-09: qty 1

## 2024-06-09 MED ORDER — DENOSUMAB 60 MG/ML ~~LOC~~ SOSY
60.0000 mg | PREFILLED_SYRINGE | Freq: Once | SUBCUTANEOUS | Status: AC
  Administered 2024-06-09: 13:00:00 60 mg via SUBCUTANEOUS

## 2024-06-22 ENCOUNTER — Ambulatory Visit
Attending: Student in an Organized Health Care Education/Training Program | Admitting: Student in an Organized Health Care Education/Training Program

## 2024-06-22 ENCOUNTER — Encounter: Payer: Self-pay | Admitting: Student in an Organized Health Care Education/Training Program

## 2024-06-22 VITALS — BP 116/73 | HR 67 | Ht 63.0 in | Wt 155.6 lb

## 2024-06-22 DIAGNOSIS — R0602 Shortness of breath: Secondary | ICD-10-CM

## 2024-06-22 DIAGNOSIS — Z5181 Encounter for therapeutic drug level monitoring: Secondary | ICD-10-CM | POA: Diagnosis not present

## 2024-06-22 DIAGNOSIS — Z79899 Other long term (current) drug therapy: Secondary | ICD-10-CM | POA: Diagnosis not present

## 2024-06-22 DIAGNOSIS — R002 Palpitations: Secondary | ICD-10-CM

## 2024-06-22 DIAGNOSIS — I480 Paroxysmal atrial fibrillation: Secondary | ICD-10-CM

## 2024-06-22 LAB — CBC
Hematocrit: 40.2 % (ref 34.0–46.6)
Hemoglobin: 12.9 g/dL (ref 11.1–15.9)
MCH: 28.5 pg (ref 26.6–33.0)
MCHC: 32.1 g/dL (ref 31.5–35.7)
MCV: 89 fL (ref 79–97)
Platelets: 207 x10E3/uL (ref 150–450)
RBC: 4.53 x10E6/uL (ref 3.77–5.28)
RDW: 14.2 % (ref 11.7–15.4)
WBC: 5.6 x10E3/uL (ref 3.4–10.8)

## 2024-06-22 LAB — BASIC METABOLIC PANEL WITH GFR
BUN/Creatinine Ratio: 15 (ref 12–28)
BUN: 12 mg/dL (ref 10–36)
CO2: 23 mmol/L (ref 20–29)
Calcium: 9 mg/dL (ref 8.7–10.3)
Chloride: 104 mmol/L (ref 96–106)
Creatinine, Ser: 0.78 mg/dL (ref 0.57–1.00)
Glucose: 86 mg/dL (ref 70–99)
Potassium: 4.5 mmol/L (ref 3.5–5.2)
Sodium: 142 mmol/L (ref 134–144)
eGFR: 71 mL/min/1.73

## 2024-06-22 LAB — MAGNESIUM: Magnesium: 2.4 mg/dL — ABNORMAL HIGH (ref 1.6–2.3)

## 2024-06-22 LAB — PRO B NATRIURETIC PEPTIDE: NT-Pro BNP: 225 pg/mL (ref 0–738)

## 2024-06-22 MED ORDER — FUROSEMIDE 20 MG PO TABS: 20.0000 mg | ORAL_TABLET | Freq: Every day | ORAL | 0 refills | Status: DC

## 2024-06-22 NOTE — Progress Notes (Unsigned)
 " Cardiology Office Note   Date:  06/22/2024  ID:  Samantha Foley, Samantha Foley 25-Feb-1930, MRN [id number] PCP: Randol Dawes, MD  [facility] Providers Electrophysiologist:  Donnice DELENA Primus, MD    History of Present Illness Samantha Foley is a 89 y.o. female with paroxysmal AF and HLD who presents for AF management.   Discussed the use of AI scribe software for clinical note transcription with the patient, who gave verbal consent to proceed. History of Present Illness Samantha Foley is a 89 year old female with rhythm issues who presents with worsening shortness of breath.  She experiences significant shortness of breath, which has increased in frequency over the past few months. Initially intermittent, it now occurs almost every other day, often after breakfast, and can last most of the day. Symptoms occasionally improve by evening.  She is currently taking amiodarone , one tablet daily. She is also on a blood thinner. There is no leg swelling, and her weight has been relatively stable, with a slight increase of about six pounds since her last visit.  No recent infections, fevers, or other symptoms are present. Her daughter notes a lack of gym attendance and no recent changes in physical activity level.  An echocardiogram was performed in September. Her symptoms were present at that time but have become more frequent since then.  ROS: SOB  Studies Reviewed  ECG review 06/22/24: NSR 67, PR 152, QRS 124, QT/c 454/479, RBBB 03/11/24: NSR 69, PR 162, QRS 122, QT/c 414/443, RBBB   TTE Result date: 03/06/24 1. Left ventricular ejection fraction, by estimation, is 60 to 65%. The left ventricle has normal function. The left ventricle has no regional wall motion abnormalities. There is mild concentric left ventricular hypertrophy. Left ventricular diastolic parameters are consistent with Grade I diastolic dysfunction (impaired relaxation). 2. Right ventricular systolic function is  normal. The right ventricular size is normal. There is normal pulmonary artery systolic pressure. The estimated right ventricular systolic pressure is 27.8 mmHg. 3. The mitral valve is normal in structure. Mild mitral valve regurgitation. No evidence of mitral stenosis. 4. The aortic valve is tricuspid. Aortic valve regurgitation is mild to moderate. No aortic stenosis is present. 5. The inferior vena cava is normal in size with greater than 50% respiratory variability, suggesting right atrial pressure of 3 mmHg.  Risk Assessment/Calculations  CHA2DS2-VASc Score = 3  This indicates a 3.2% annual risk of stroke. The patient's score is based upon: CHF History: 0 HTN History: 0 Diabetes History: 0 Stroke History: 0 Vascular Disease History: 0 Age Score: 2 Gender Score: 1  Physical Exam VS:  BP 116/73 (BP Location: Left Arm, Patient Position: Sitting, Cuff Size: Normal)   Pulse 67   Ht 5' 3 (1.6 m)   Wt 155 lb 9.6 oz (70.6 kg)   SpO2 95%   BMI 27.56 kg/m   Wt Readings from Last 3 Encounters:  06/22/24 155 lb 9.6 oz (70.6 kg)  05/27/24 159 lb (72.1 kg)  05/13/24 155 lb 6.4 oz (70.5 kg)    GEN: Well nourished, well developed in no acute distress CARDIAC: RRR, no murmurs, rubs, gallops RESPIRATORY:  Clear to auscultation without rales, wheezing or rhonchi  EXTREMITIES:  No edema; No deformity   ASSESSMENT AND PLAN Samantha Foley is a 89 y.o. female with paroxysmal AF and HLD who presents for AF management.   Paroxysmal AF SOB We reviewed the pathophysiology of atrial fibrillation and, natural progression of the disease and associated symptoms.  She seems to be most symptomatic when she is in RVR and has been trialed on flecainide  however with her intermittent RBBB and some QRS widening on prior ECGs it was subsequently discontinued.  At one point she had taken additional flecainide  on accident and it was held.  At her last visit she was on Lopressor  12.5 mg p.o. twice daily.   Because she was having symptomatic episodes of AF/RVR we ended up starting amiodarone  200 mg daily and stopping the Lopressor .  She did well for several months however has been having ongoing shortness of breath daily.  The symptoms preceded the amiodarone  so I do not think that this is related but they have become slightly more frequent with time.  Check lab work today to evaluate for HF exacerbation however her proBNP was only mildly elevated at 225.  The rest of her lab work was unremarkable.  Sent her Lasix  20 mg daily to try to see if it improved her symptoms at all.  She is going to keep track of her weight and see if there is any change.  If she has persistent symptoms then I would check PFTs with her new start of amiodarone  recently she will need this anyway for ongoing monitoring.   Dispo: RTC 6 months   A total of 25 minutes was spent preparing for the patient, reviewing history, performing exam, document encounter, coordinating care and counseling the patient. 14 minutes was spent with direct patient care.   Signed, Donnice DELENA Primus, MD  "

## 2024-06-22 NOTE — Progress Notes (Unsigned)
" °  She seems to be most symptomatic when she is in RVR and has been trialed on flecainide  however with her intermittent RBBB and some QRS widening on prior ECGs it was subsequently discontinued.  At one point she had taken additional flecainide  on accident and it was held.  At her last visit she was on Lopressor  12.5 mg p.o. twice daily.   Because she was having symptomatic episodes of AF/RVR we ended up starting amiodarone  200 mg daily and stopping the Lopressor .  She did well for several months however has been having ongoing shortness of breath daily.  The symptoms preceded the amiodarone  so I do not think that this is related but they have become slightly more frequent with time.  Check lab work today to evaluate for HF exacerbation however her proBNP was only mildly elevated at 225.  The rest of her lab work was unremarkable.  Sent her Lasix  20 mg daily to try to see if it improved her symptoms at all.  She is going to keep track of her weight and see if there is any change.  If she has persistent symptoms then I would check PFTs with her new start of amiodarone  recently she will need this anyway for ongoing monitoring.   Dispo: RTC 6 months   A total of 25 minutes was spent preparing for the patient, reviewing history, performing exam, document encounter, coordinating care and counseling the patient. 14 minutes was spent with direct patient care.   Signed, Donnice DELENA Primus, MD  "

## 2024-06-22 NOTE — Patient Instructions (Signed)

## 2024-06-23 ENCOUNTER — Ambulatory Visit: Payer: Self-pay

## 2024-07-15 ENCOUNTER — Other Ambulatory Visit: Payer: Self-pay | Admitting: Student in an Organized Health Care Education/Training Program

## 2024-07-20 ENCOUNTER — Other Ambulatory Visit: Payer: Self-pay | Admitting: Family Medicine

## 2024-07-20 DIAGNOSIS — K219 Gastro-esophageal reflux disease without esophagitis: Secondary | ICD-10-CM

## 2024-07-22 ENCOUNTER — Encounter (HOSPITAL_COMMUNITY): Payer: Self-pay | Admitting: Family Medicine

## 2024-07-31 ENCOUNTER — Encounter: Payer: Self-pay | Admitting: Family Medicine

## 2024-07-31 ENCOUNTER — Other Ambulatory Visit: Payer: Self-pay | Admitting: Specialist

## 2024-07-31 DIAGNOSIS — M5459 Other low back pain: Secondary | ICD-10-CM

## 2024-08-13 ENCOUNTER — Ambulatory Visit: Payer: HMO | Admitting: Family Medicine

## 2024-12-09 ENCOUNTER — Encounter (HOSPITAL_COMMUNITY)
